# Patient Record
Sex: Female | Born: 1935 | Race: Black or African American | Hispanic: No | Smoking: Former smoker
Health system: Southern US, Community
[De-identification: ages and names within clinical notes are randomized; demographics above are authoritative.]

## PROBLEM LIST (undated history)

## (undated) DIAGNOSIS — H269 Unspecified cataract: Secondary | ICD-10-CM

## (undated) DIAGNOSIS — R413 Other amnesia: Secondary | ICD-10-CM

## (undated) DIAGNOSIS — F0390 Unspecified dementia without behavioral disturbance: Secondary | ICD-10-CM

## (undated) DIAGNOSIS — R51 Headache: Secondary | ICD-10-CM

## (undated) DIAGNOSIS — I10 Essential (primary) hypertension: Secondary | ICD-10-CM

## (undated) DIAGNOSIS — L930 Discoid lupus erythematosus: Secondary | ICD-10-CM

## (undated) DIAGNOSIS — G20A1 Parkinson's disease without dyskinesia, without mention of fluctuations: Secondary | ICD-10-CM

## (undated) DIAGNOSIS — F419 Anxiety disorder, unspecified: Secondary | ICD-10-CM

## (undated) HISTORY — DX: Anxiety disorder, unspecified: F41.9

## (undated) HISTORY — PX: EXCISIONAL HEMORRHOIDECTOMY: SHX1541

## (undated) HISTORY — PX: ABDOMINAL HYSTERECTOMY: SHX81

## (undated) HISTORY — PX: BREAST EXCISIONAL BIOPSY: SUR124

## (undated) HISTORY — DX: Other amnesia: R41.3

## (undated) HISTORY — DX: Unspecified dementia, unspecified severity, without behavioral disturbance, psychotic disturbance, mood disturbance, and anxiety: F03.90

## (undated) HISTORY — DX: Headache: R51

## (undated) HISTORY — DX: Parkinson's disease without dyskinesia, without mention of fluctuations: G20.A1

## (undated) HISTORY — DX: Discoid lupus erythematosus: L93.0

## (undated) HISTORY — DX: Unspecified cataract: H26.9

## (undated) HISTORY — PX: CHOLECYSTECTOMY: SHX55

---

## 1997-06-07 ENCOUNTER — Other Ambulatory Visit: Admission: RE | Admit: 1997-06-07 | Discharge: 1997-06-07 | Payer: Self-pay | Admitting: Internal Medicine

## 1997-06-13 ENCOUNTER — Ambulatory Visit (HOSPITAL_COMMUNITY): Admission: RE | Admit: 1997-06-13 | Discharge: 1997-06-13 | Payer: Self-pay | Admitting: Internal Medicine

## 1998-05-22 ENCOUNTER — Other Ambulatory Visit: Admission: RE | Admit: 1998-05-22 | Discharge: 1998-05-22 | Payer: Self-pay | Admitting: Internal Medicine

## 1999-08-06 ENCOUNTER — Encounter: Admission: RE | Admit: 1999-08-06 | Discharge: 1999-08-06 | Payer: Self-pay | Admitting: Internal Medicine

## 1999-08-06 ENCOUNTER — Encounter: Payer: Self-pay | Admitting: Internal Medicine

## 2000-08-07 ENCOUNTER — Encounter: Admission: RE | Admit: 2000-08-07 | Discharge: 2000-08-07 | Payer: Self-pay | Admitting: Internal Medicine

## 2000-08-07 ENCOUNTER — Encounter: Payer: Self-pay | Admitting: Internal Medicine

## 2001-01-28 ENCOUNTER — Other Ambulatory Visit: Admission: RE | Admit: 2001-01-28 | Discharge: 2001-01-28 | Payer: Self-pay | Admitting: Internal Medicine

## 2001-01-28 ENCOUNTER — Encounter: Payer: Self-pay | Admitting: Internal Medicine

## 2001-01-28 ENCOUNTER — Ambulatory Visit (HOSPITAL_COMMUNITY): Admission: RE | Admit: 2001-01-28 | Discharge: 2001-01-28 | Payer: Self-pay | Admitting: Internal Medicine

## 2001-08-03 ENCOUNTER — Encounter: Admission: RE | Admit: 2001-08-03 | Discharge: 2001-08-03 | Payer: Self-pay | Admitting: Internal Medicine

## 2001-08-03 ENCOUNTER — Encounter: Payer: Self-pay | Admitting: Internal Medicine

## 2002-08-12 ENCOUNTER — Encounter: Admission: RE | Admit: 2002-08-12 | Discharge: 2002-08-12 | Payer: Self-pay | Admitting: Internal Medicine

## 2002-08-12 ENCOUNTER — Encounter: Payer: Self-pay | Admitting: Internal Medicine

## 2002-12-13 ENCOUNTER — Other Ambulatory Visit: Admission: RE | Admit: 2002-12-13 | Discharge: 2002-12-13 | Payer: Self-pay | Admitting: Internal Medicine

## 2003-08-26 ENCOUNTER — Encounter: Admission: RE | Admit: 2003-08-26 | Discharge: 2003-08-26 | Payer: Self-pay | Admitting: Internal Medicine

## 2004-06-26 ENCOUNTER — Other Ambulatory Visit: Admission: RE | Admit: 2004-06-26 | Discharge: 2004-06-26 | Payer: Self-pay | Admitting: Internal Medicine

## 2004-09-06 ENCOUNTER — Encounter: Admission: RE | Admit: 2004-09-06 | Discharge: 2004-09-06 | Payer: Self-pay | Admitting: Internal Medicine

## 2005-06-27 ENCOUNTER — Other Ambulatory Visit: Admission: RE | Admit: 2005-06-27 | Discharge: 2005-06-27 | Payer: Self-pay | Admitting: Internal Medicine

## 2005-07-01 ENCOUNTER — Ambulatory Visit (HOSPITAL_COMMUNITY): Admission: RE | Admit: 2005-07-01 | Discharge: 2005-07-01 | Payer: Self-pay | Admitting: Internal Medicine

## 2005-09-09 ENCOUNTER — Encounter: Admission: RE | Admit: 2005-09-09 | Discharge: 2005-09-09 | Payer: Self-pay | Admitting: Internal Medicine

## 2006-09-16 ENCOUNTER — Ambulatory Visit (HOSPITAL_COMMUNITY): Admission: RE | Admit: 2006-09-16 | Discharge: 2006-09-16 | Payer: Self-pay | Admitting: Internal Medicine

## 2006-09-19 ENCOUNTER — Encounter: Admission: RE | Admit: 2006-09-19 | Discharge: 2006-09-19 | Payer: Self-pay | Admitting: Internal Medicine

## 2006-12-23 ENCOUNTER — Encounter (HOSPITAL_COMMUNITY): Admission: RE | Admit: 2006-12-23 | Discharge: 2007-01-30 | Payer: Self-pay | Admitting: Internal Medicine

## 2007-09-21 ENCOUNTER — Encounter: Admission: RE | Admit: 2007-09-21 | Discharge: 2007-09-21 | Payer: Self-pay | Admitting: Internal Medicine

## 2007-09-23 ENCOUNTER — Encounter: Admission: RE | Admit: 2007-09-23 | Discharge: 2007-09-23 | Payer: Self-pay | Admitting: Internal Medicine

## 2008-09-21 ENCOUNTER — Encounter: Admission: RE | Admit: 2008-09-21 | Discharge: 2008-09-21 | Payer: Self-pay | Admitting: Internal Medicine

## 2009-09-29 ENCOUNTER — Encounter: Admission: RE | Admit: 2009-09-29 | Discharge: 2009-09-29 | Payer: Self-pay | Admitting: Internal Medicine

## 2009-12-29 ENCOUNTER — Ambulatory Visit (HOSPITAL_COMMUNITY)
Admission: RE | Admit: 2009-12-29 | Discharge: 2009-12-29 | Payer: Self-pay | Source: Home / Self Care | Attending: Internal Medicine | Admitting: Internal Medicine

## 2010-09-12 ENCOUNTER — Other Ambulatory Visit: Payer: Self-pay | Admitting: Internal Medicine

## 2010-09-12 DIAGNOSIS — Z1231 Encounter for screening mammogram for malignant neoplasm of breast: Secondary | ICD-10-CM

## 2010-10-02 ENCOUNTER — Ambulatory Visit
Admission: RE | Admit: 2010-10-02 | Discharge: 2010-10-02 | Disposition: A | Discharge status: Home / Self Care | Payer: Medicare Other | Source: Ambulatory Visit | Attending: Internal Medicine | Admitting: Internal Medicine

## 2010-10-02 DIAGNOSIS — Z1231 Encounter for screening mammogram for malignant neoplasm of breast: Secondary | ICD-10-CM

## 2011-08-29 ENCOUNTER — Other Ambulatory Visit: Payer: Self-pay | Admitting: Internal Medicine

## 2011-08-29 DIAGNOSIS — Z1231 Encounter for screening mammogram for malignant neoplasm of breast: Secondary | ICD-10-CM

## 2011-10-04 ENCOUNTER — Ambulatory Visit
Admission: RE | Admit: 2011-10-04 | Discharge: 2011-10-04 | Disposition: A | Discharge status: Home / Self Care | Payer: Medicare Other | Source: Ambulatory Visit | Attending: Internal Medicine | Admitting: Internal Medicine

## 2011-10-04 DIAGNOSIS — Z1231 Encounter for screening mammogram for malignant neoplasm of breast: Secondary | ICD-10-CM

## 2012-09-07 ENCOUNTER — Other Ambulatory Visit: Payer: Self-pay

## 2012-09-07 DIAGNOSIS — Z1231 Encounter for screening mammogram for malignant neoplasm of breast: Secondary | ICD-10-CM

## 2012-10-05 ENCOUNTER — Ambulatory Visit
Admission: RE | Admit: 2012-10-05 | Discharge: 2012-10-05 | Disposition: A | Discharge status: Home / Self Care | Payer: Medicare Other | Source: Ambulatory Visit

## 2012-10-05 DIAGNOSIS — Z1231 Encounter for screening mammogram for malignant neoplasm of breast: Secondary | ICD-10-CM

## 2012-10-06 ENCOUNTER — Other Ambulatory Visit: Payer: Self-pay | Admitting: Internal Medicine

## 2012-10-06 DIAGNOSIS — R928 Other abnormal and inconclusive findings on diagnostic imaging of breast: Secondary | ICD-10-CM

## 2012-10-21 ENCOUNTER — Ambulatory Visit
Admission: RE | Admit: 2012-10-21 | Discharge: 2012-10-21 | Disposition: A | Discharge status: Home / Self Care | Payer: Medicare Other | Source: Ambulatory Visit | Attending: Internal Medicine | Admitting: Internal Medicine

## 2012-10-21 DIAGNOSIS — R928 Other abnormal and inconclusive findings on diagnostic imaging of breast: Secondary | ICD-10-CM

## 2013-01-16 ENCOUNTER — Other Ambulatory Visit: Payer: Self-pay

## 2013-01-16 ENCOUNTER — Emergency Department (HOSPITAL_COMMUNITY): Payer: Medicare Other

## 2013-01-16 ENCOUNTER — Emergency Department (HOSPITAL_COMMUNITY)
Admission: EM | Admit: 2013-01-16 | Discharge: 2013-01-16 | Disposition: A | Discharge status: Home / Self Care | Payer: Medicare Other | Attending: Emergency Medicine | Admitting: Emergency Medicine

## 2013-01-16 ENCOUNTER — Encounter (HOSPITAL_COMMUNITY): Payer: Self-pay | Admitting: Emergency Medicine

## 2013-01-16 DIAGNOSIS — N390 Urinary tract infection, site not specified: Secondary | ICD-10-CM | POA: Diagnosis present

## 2013-01-16 DIAGNOSIS — R079 Chest pain, unspecified: Secondary | ICD-10-CM | POA: Insufficient documentation

## 2013-01-16 DIAGNOSIS — Z79899 Other long term (current) drug therapy: Secondary | ICD-10-CM | POA: Insufficient documentation

## 2013-01-16 DIAGNOSIS — I10 Essential (primary) hypertension: Secondary | ICD-10-CM | POA: Diagnosis present

## 2013-01-16 HISTORY — DX: Essential (primary) hypertension: I10

## 2013-01-16 LAB — URINALYSIS, ROUTINE W REFLEX MICROSCOPIC
Bilirubin Urine: NEGATIVE
Glucose, UA: NEGATIVE mg/dL
Ketones, ur: NEGATIVE mg/dL
Nitrite: NEGATIVE
Protein, ur: NEGATIVE mg/dL
Specific Gravity, Urine: 1.009 (ref 1.005–1.030)
pH: 6.5 (ref 5.0–8.0)

## 2013-01-16 LAB — CBC
HCT: 35.6 % — ABNORMAL LOW (ref 36.0–46.0)
Hemoglobin: 12 g/dL (ref 12.0–15.0)
MCH: 29.6 pg (ref 26.0–34.0)
MCHC: 33.7 g/dL (ref 30.0–36.0)
RBC: 4.06 MIL/uL (ref 3.87–5.11)

## 2013-01-16 LAB — BASIC METABOLIC PANEL
BUN: 19 mg/dL (ref 6–23)
CO2: 25 mEq/L (ref 19–32)
Calcium: 8.9 mg/dL (ref 8.4–10.5)
GFR calc non Af Amer: 43 mL/min — ABNORMAL LOW (ref >90)
Glucose, Bld: 94 mg/dL (ref 70–99)

## 2013-01-16 LAB — POCT I-STAT TROPONIN I: Troponin i, poc: 0 ng/mL (ref 0.00–0.08)

## 2013-01-16 LAB — URINE MICROSCOPIC-ADD ON

## 2013-01-16 MED ORDER — CEPHALEXIN 500 MG PO CAPS
500.0000 mg | ORAL_CAPSULE | Freq: Once | ORAL | Status: AC
  Administered 2013-01-16: 500 mg via ORAL
  Filled 2013-01-16: qty 1

## 2013-01-16 MED ORDER — HYDROCHLOROTHIAZIDE 12.5 MG PO CAPS
12.5000 mg | ORAL_CAPSULE | Freq: Once | ORAL | Status: AC
  Administered 2013-01-16: 12.5 mg via ORAL
  Filled 2013-01-16: qty 1

## 2013-01-16 MED ORDER — HYDROCHLOROTHIAZIDE 25 MG PO TABS: 12.5000 mg | ORAL_TABLET | Freq: Every day | ORAL | Status: DC

## 2013-01-16 MED ORDER — HYDRALAZINE HCL 20 MG/ML IJ SOLN
5.0000 mg | Freq: Once | INTRAMUSCULAR | Status: DC
  Filled 2013-01-16: qty 0.25

## 2013-01-16 MED ORDER — SODIUM CHLORIDE 0.9 % IV BOLUS (SEPSIS)
500.0000 mL | Freq: Once | INTRAVENOUS | Status: AC
  Administered 2013-01-16: 500 mL via INTRAVENOUS

## 2013-01-16 MED ORDER — HYDRALAZINE HCL 20 MG/ML IJ SOLN
10.0000 mg | Freq: Once | INTRAMUSCULAR | Status: AC
  Administered 2013-01-16: 10 mg via INTRAVENOUS
  Filled 2013-01-16 (×2): qty 0.5

## 2013-01-16 MED ORDER — CEPHALEXIN 500 MG PO CAPS: 500.0000 mg | ORAL_CAPSULE | Freq: Four times a day (QID) | ORAL | Status: DC

## 2013-01-16 NOTE — Discharge Instructions (Signed)
Whitney Henderson was seen in the emergency department on 01/16/2013 for elevated blood pressure. Your blood pressure was elevated to 215/87. She got screening labwork, EKG, chest x-ray. The chest x-ray showed some cardiomegaly but no other acute change. Her blood pressure came down to 180/80 with 10 mg of hydralazine. The family requested medication until she see her primary care physician so her a prescription for hydrochlorothiazide. She also had an equivocal urine which I elected to conservatively treat for a UTI with Keflex.

## 2013-01-16 NOTE — ED Provider Notes (Signed)
CSN: [id number]     Arrival date & time 01/16/13  1431 History   First MD Initiated Contact with Patient 01/16/13 1524     Chief Complaint  Patient presents with  . Hypertension   (Consider location/radiation/quality/duration/timing/severity/associated sxs/prior Treatment) Patient is a 79 y.o. female presenting with hypertension. The history is provided by the patient.  Hypertension This is a chronic problem. Episode onset: worsening over last 4 days. The problem occurs constantly. The problem has not changed since onset.Associated symptoms include chest pain (mild over the last few weeks). Pertinent negatives include no abdominal pain, no headaches and no shortness of breath. Nothing aggravates the symptoms. Nothing relieves the symptoms. She has tried nothing for the symptoms. The treatment provided no relief.    Past Medical History  Diagnosis Date  . Hypertension    Past Surgical History  Procedure Laterality Date  . Abdominal hysterectomy    . Cholecystectomy     History reviewed. No pertinent family history. History  Substance Use Topics  . Smoking status: Never Smoker   . Smokeless tobacco: Not on file  . Alcohol Use: No   OB History   Grav Para Term Preterm Abortions TAB SAB Ect Mult Living                 Review of Systems  Constitutional: Negative for fever and fatigue.  HENT: Negative for congestion and drooling.   Eyes: Negative for pain.  Respiratory: Negative for cough and shortness of breath.   Cardiovascular: Positive for chest pain (mild over the last few weeks).  Gastrointestinal: Negative for nausea, vomiting, abdominal pain and diarrhea.  Genitourinary: Negative for dysuria and hematuria.  Musculoskeletal: Negative for back pain, gait problem and neck pain.  Skin: Negative for color change.  Neurological: Negative for dizziness and headaches.  Hematological: Negative for adenopathy.  Psychiatric/Behavioral: Negative for behavioral problems.  All  other systems reviewed and are negative.    Allergies  Review of patient's allergies indicates no known allergies.  Home Medications   Current Outpatient Rx  Name  Route  Sig  Dispense  Refill  . olmesartan (BENICAR) 40 MG tablet   Oral   Take 40 mg by mouth daily.         [name] OVER THE COUNTER MEDICATION   Oral   Take 1 tablet by mouth daily as needed (mood).         . traZODone (DESYREL) 100 MG tablet   Oral   Take 100 mg by mouth at bedtime as needed for sleep.          BP 215/84  Temp(Src) 98.1 F (36.7 C) (Oral)  Resp 20  SpO2 98% Physical Exam  Nursing note and vitals reviewed. Constitutional: She is oriented to person, place, and time. She appears well-developed and well-nourished.  HENT:  Head: Normocephalic.  Mouth/Throat: Oropharynx is clear and moist. No oropharyngeal exudate.  Eyes: Conjunctivae and EOM are normal. Pupils are equal, round, and reactive to light.  Neck: Normal range of motion. Neck supple.  Cardiovascular: Normal rate, regular rhythm, normal heart sounds and intact distal pulses.  Exam reveals no gallop and no friction rub.   No murmur heard. Pulmonary/Chest: Effort normal and breath sounds normal. No respiratory distress. She has no wheezes.  Abdominal: Soft. Bowel sounds are normal. There is no tenderness. There is no rebound and no guarding.  Musculoskeletal: Normal range of motion. She exhibits no edema and no tenderness.  Neurological: She is alert and oriented to  person, place, and time. She has normal strength. No cranial nerve deficit or sensory deficit. She displays a negative Romberg sign. Coordination and gait normal.  Normal speech and understanding.  Normal finger to nose bilaterally.  Able to ambulate forwards and backwards without difficulty.  Skin: Skin is warm and dry.  Psychiatric: She has a normal mood and affect. Her behavior is normal.    ED Course  Procedures (including critical care time) Labs Review Labs  Reviewed  CBC - Abnormal; Notable for the following:    HCT 35.6 (*)    All other components within normal limits  BASIC METABOLIC PANEL - Abnormal; Notable for the following:    Creatinine, Ser 1.16 (*)    GFR calc non Af Amer 43 (*)    GFR calc Af Amer 49 (*)    All other components within normal limits  URINALYSIS, ROUTINE W REFLEX MICROSCOPIC - Abnormal; Notable for the following:    Leukocytes, UA MODERATE (*)    All other components within normal limits  URINE MICROSCOPIC-ADD ON  POCT I-STAT TROPONIN I   Imaging Review Dg Chest 2 View  01/16/2013   CLINICAL DATA:  Hypertension.  EXAM: CHEST  2 VIEW  COMPARISON:  None.  FINDINGS: Heart size is mildly enlarged but there is no pulmonary edema. Lungs are clear. No pneumothorax or pleural effusion.  IMPRESSION: Cardiomegaly without acute disease.   Electronically Signed   By: Drusilla Kanner M.D.   On: 01/16/2013 16:30    EKG Interpretation   None       Date: 01/16/2013  Rate: 57  Rhythm: normal sinus rhythm  QRS Axis: normal  Intervals: normal  ST/T Wave abnormalities: normal  Conduction Disutrbances:none  Narrative Interpretation: No ST or T wave changes consistent with ischemia.    Old EKG Reviewed: none available    MDM   1. UTI (lower urinary tract infection)   2. Hypertension    3:39 PM 79 y.o. female with a history of hypertension on benicar who presents with elevated blood pressures over the last 4 days. The patient states that she checks her blood pressures daily. She notes blood pressures with a systolic component in the 180s over the last few days and in the low  200s today. She states that for the last few weeks she has also had some very mild intermittent chest discomfort in her right upper chest which she describes as pressure. She notes this typically occurs at rest. She is afebrile and hypertensive here today. She denies any dizziness, headache, or blurred vision. She denies any shortness of breath. Will  get screening labwork, 500 cc IV fluid bolus, and hydralazine. She notes that she has not missed her meds. I suspect her elevated BP is related to the holidays and stress as she has visitors in her house.   6:00 PM: BP has dec approp w/ rest and 10mg  hydralazine. Family would like a new medication until she can f/u w/ her pcp, will start hctz. UA also equivocal for UTI, discussed w/ pt and family, will treat. I have discussed the diagnosis/risks/treatment options with the patient and family and believe the pt to be eligible for discharge home to follow-up with pcp next week. We also discussed returning to the ED immediately if new or worsening sx occur. We discussed the sx which are most concerning (e.g., sob, cp, blurred vision, HA, dizziness, continued elevated BP's) that necessitate immediate return. Any new prescriptions provided to the patient are listed below.  Discharge Medication List as of 01/16/2013  6:04 PM    START taking these medications   Details  cephALEXin (KEFLEX) 500 MG capsule Take 1 capsule (500 mg total) by mouth 4 (four) times daily., Starting 01/16/2013, Until Discontinued, Print    hydrochlorothiazide (HYDRODIURIL) 25 MG tablet Take 0.5 tablets (12.5 mg total) by mouth daily., Starting 01/16/2013, Until Discontinued, Print         Junius Argyle, MD 01/17/13 [phone]

## 2013-01-16 NOTE — ED Notes (Signed)
Pt w/ hx of HTN.  States that her BP has been going up despite taking her medication. This morning, it started out at 180s systolic, then 207 systolic, and now in the ER is 216/81.  C/o chest discomfort.

## 2013-02-03 ENCOUNTER — Other Ambulatory Visit: Payer: Self-pay | Admitting: Internal Medicine

## 2013-02-03 DIAGNOSIS — R109 Unspecified abdominal pain: Secondary | ICD-10-CM

## 2013-02-03 DIAGNOSIS — R634 Abnormal weight loss: Secondary | ICD-10-CM

## 2013-02-03 DIAGNOSIS — R630 Anorexia: Secondary | ICD-10-CM

## 2013-02-10 ENCOUNTER — Ambulatory Visit (HOSPITAL_COMMUNITY): Payer: Medicare Other

## 2013-02-15 ENCOUNTER — Other Ambulatory Visit: Payer: Self-pay | Admitting: Internal Medicine

## 2013-02-15 DIAGNOSIS — R109 Unspecified abdominal pain: Secondary | ICD-10-CM

## 2013-02-17 ENCOUNTER — Ambulatory Visit (HOSPITAL_COMMUNITY): Payer: Medicare Other

## 2013-02-19 ENCOUNTER — Ambulatory Visit (HOSPITAL_COMMUNITY)
Admission: RE | Admit: 2013-02-19 | Discharge: 2013-02-19 | Disposition: A | Discharge status: Home / Self Care | Payer: Medicare Other | Source: Ambulatory Visit | Attending: Internal Medicine | Admitting: Internal Medicine

## 2013-02-19 DIAGNOSIS — K838 Other specified diseases of biliary tract: Secondary | ICD-10-CM | POA: Insufficient documentation

## 2013-02-19 DIAGNOSIS — N289 Disorder of kidney and ureter, unspecified: Secondary | ICD-10-CM | POA: Insufficient documentation

## 2013-02-19 DIAGNOSIS — K8689 Other specified diseases of pancreas: Secondary | ICD-10-CM | POA: Insufficient documentation

## 2013-02-19 DIAGNOSIS — R109 Unspecified abdominal pain: Secondary | ICD-10-CM | POA: Insufficient documentation

## 2013-02-19 MED ORDER — IOHEXOL 300 MG/ML  SOLN
100.0000 mL | Freq: Once | INTRAMUSCULAR | Status: AC | PRN
  Administered 2013-02-19: 100 mL via INTRAVENOUS

## 2013-02-23 ENCOUNTER — Ambulatory Visit (HOSPITAL_COMMUNITY)
Admission: RE | Admit: 2013-02-23 | Discharge: 2013-02-23 | Disposition: A | Discharge status: Home / Self Care | Payer: Medicare Other | Source: Ambulatory Visit | Attending: Internal Medicine | Admitting: Internal Medicine

## 2013-02-23 ENCOUNTER — Other Ambulatory Visit: Payer: Self-pay | Admitting: Internal Medicine

## 2013-02-23 DIAGNOSIS — IMO0002 Reserved for concepts with insufficient information to code with codable children: Secondary | ICD-10-CM | POA: Insufficient documentation

## 2013-02-23 DIAGNOSIS — M545 Low back pain, unspecified: Secondary | ICD-10-CM | POA: Insufficient documentation

## 2013-02-23 DIAGNOSIS — R52 Pain, unspecified: Secondary | ICD-10-CM

## 2013-02-23 DIAGNOSIS — T148XXA Other injury of unspecified body region, initial encounter: Secondary | ICD-10-CM | POA: Insufficient documentation

## 2013-02-23 DIAGNOSIS — I700 Atherosclerosis of aorta: Secondary | ICD-10-CM | POA: Insufficient documentation

## 2013-02-23 DIAGNOSIS — W19XXXA Unspecified fall, initial encounter: Secondary | ICD-10-CM | POA: Insufficient documentation

## 2013-02-24 ENCOUNTER — Other Ambulatory Visit: Payer: Self-pay | Admitting: Internal Medicine

## 2013-02-24 DIAGNOSIS — IMO0002 Reserved for concepts with insufficient information to code with codable children: Secondary | ICD-10-CM

## 2013-02-27 ENCOUNTER — Ambulatory Visit
Admission: RE | Admit: 2013-02-27 | Discharge: 2013-02-27 | Disposition: A | Discharge status: Home / Self Care | Payer: Medicare Other | Source: Ambulatory Visit | Attending: Internal Medicine | Admitting: Internal Medicine

## 2013-02-27 DIAGNOSIS — IMO0002 Reserved for concepts with insufficient information to code with codable children: Secondary | ICD-10-CM

## 2013-09-15 ENCOUNTER — Other Ambulatory Visit: Payer: Self-pay

## 2013-09-15 DIAGNOSIS — Z1231 Encounter for screening mammogram for malignant neoplasm of breast: Secondary | ICD-10-CM

## 2013-10-06 ENCOUNTER — Ambulatory Visit
Admission: RE | Admit: 2013-10-06 | Discharge: 2013-10-06 | Disposition: A | Discharge status: Home / Self Care | Payer: Medicare Other | Source: Ambulatory Visit

## 2013-10-06 DIAGNOSIS — Z1231 Encounter for screening mammogram for malignant neoplasm of breast: Secondary | ICD-10-CM

## 2013-10-07 ENCOUNTER — Other Ambulatory Visit: Payer: Self-pay | Admitting: Gastroenterology

## 2013-10-07 DIAGNOSIS — R1084 Generalized abdominal pain: Secondary | ICD-10-CM

## 2013-10-13 ENCOUNTER — Ambulatory Visit
Admission: RE | Admit: 2013-10-13 | Discharge: 2013-10-13 | Disposition: A | Discharge status: Home / Self Care | Payer: Medicare Other | Source: Ambulatory Visit | Attending: Gastroenterology | Admitting: Gastroenterology

## 2013-10-13 DIAGNOSIS — R1084 Generalized abdominal pain: Secondary | ICD-10-CM

## 2013-10-13 MED ORDER — IOHEXOL 300 MG/ML  SOLN
100.0000 mL | Freq: Once | INTRAMUSCULAR | Status: AC | PRN
  Administered 2013-10-13: 100 mL via INTRAVENOUS

## 2014-04-21 DIAGNOSIS — J40 Bronchitis, not specified as acute or chronic: Secondary | ICD-10-CM | POA: Diagnosis not present

## 2014-04-21 DIAGNOSIS — M150 Primary generalized (osteo)arthritis: Secondary | ICD-10-CM | POA: Diagnosis not present

## 2014-04-21 DIAGNOSIS — R7309 Other abnormal glucose: Secondary | ICD-10-CM | POA: Diagnosis not present

## 2014-04-21 DIAGNOSIS — M2550 Pain in unspecified joint: Secondary | ICD-10-CM | POA: Diagnosis not present

## 2014-04-21 DIAGNOSIS — I10 Essential (primary) hypertension: Secondary | ICD-10-CM | POA: Diagnosis not present

## 2014-04-21 DIAGNOSIS — R079 Chest pain, unspecified: Secondary | ICD-10-CM | POA: Diagnosis not present

## 2014-04-27 DIAGNOSIS — H40013 Open angle with borderline findings, low risk, bilateral: Secondary | ICD-10-CM | POA: Diagnosis not present

## 2014-05-12 DIAGNOSIS — K589 Irritable bowel syndrome without diarrhea: Secondary | ICD-10-CM | POA: Diagnosis not present

## 2014-05-12 DIAGNOSIS — I10 Essential (primary) hypertension: Secondary | ICD-10-CM | POA: Diagnosis not present

## 2014-05-12 DIAGNOSIS — F4323 Adjustment disorder with mixed anxiety and depressed mood: Secondary | ICD-10-CM | POA: Diagnosis not present

## 2014-05-12 DIAGNOSIS — M150 Primary generalized (osteo)arthritis: Secondary | ICD-10-CM | POA: Diagnosis not present

## 2014-08-03 DIAGNOSIS — H25013 Cortical age-related cataract, bilateral: Secondary | ICD-10-CM | POA: Diagnosis not present

## 2014-08-03 DIAGNOSIS — I708 Atherosclerosis of other arteries: Secondary | ICD-10-CM | POA: Diagnosis not present

## 2014-08-03 DIAGNOSIS — H2513 Age-related nuclear cataract, bilateral: Secondary | ICD-10-CM | POA: Diagnosis not present

## 2014-08-03 DIAGNOSIS — H40013 Open angle with borderline findings, low risk, bilateral: Secondary | ICD-10-CM | POA: Diagnosis not present

## 2014-08-03 DIAGNOSIS — H04123 Dry eye syndrome of bilateral lacrimal glands: Secondary | ICD-10-CM | POA: Diagnosis not present

## 2014-08-11 ENCOUNTER — Encounter (INDEPENDENT_AMBULATORY_CARE_PROVIDER_SITE_OTHER): Payer: Self-pay | Admitting: Ophthalmology

## 2014-08-12 ENCOUNTER — Encounter (INDEPENDENT_AMBULATORY_CARE_PROVIDER_SITE_OTHER): Payer: Medicare Other | Admitting: Ophthalmology

## 2014-08-12 DIAGNOSIS — H3531 Nonexudative age-related macular degeneration: Secondary | ICD-10-CM

## 2014-08-12 DIAGNOSIS — H43813 Vitreous degeneration, bilateral: Secondary | ICD-10-CM

## 2014-08-12 DIAGNOSIS — H3509 Other intraretinal microvascular abnormalities: Secondary | ICD-10-CM | POA: Diagnosis not present

## 2014-08-12 DIAGNOSIS — H35033 Hypertensive retinopathy, bilateral: Secondary | ICD-10-CM | POA: Diagnosis not present

## 2014-08-12 DIAGNOSIS — I10 Essential (primary) hypertension: Secondary | ICD-10-CM | POA: Diagnosis not present

## 2014-08-17 DIAGNOSIS — I10 Essential (primary) hypertension: Secondary | ICD-10-CM | POA: Diagnosis not present

## 2014-08-17 DIAGNOSIS — F4323 Adjustment disorder with mixed anxiety and depressed mood: Secondary | ICD-10-CM | POA: Diagnosis not present

## 2014-08-17 DIAGNOSIS — M150 Primary generalized (osteo)arthritis: Secondary | ICD-10-CM | POA: Diagnosis not present

## 2014-09-15 ENCOUNTER — Other Ambulatory Visit: Payer: Self-pay

## 2014-09-15 DIAGNOSIS — Z1231 Encounter for screening mammogram for malignant neoplasm of breast: Secondary | ICD-10-CM

## 2014-09-20 DIAGNOSIS — I10 Essential (primary) hypertension: Secondary | ICD-10-CM | POA: Diagnosis not present

## 2014-09-20 DIAGNOSIS — F4323 Adjustment disorder with mixed anxiety and depressed mood: Secondary | ICD-10-CM | POA: Diagnosis not present

## 2014-09-20 DIAGNOSIS — M150 Primary generalized (osteo)arthritis: Secondary | ICD-10-CM | POA: Diagnosis not present

## 2014-10-10 ENCOUNTER — Ambulatory Visit
Admission: RE | Admit: 2014-10-10 | Discharge: 2014-10-10 | Disposition: A | Discharge status: Home / Self Care | Payer: Medicare Other | Source: Ambulatory Visit

## 2014-10-10 DIAGNOSIS — Z1231 Encounter for screening mammogram for malignant neoplasm of breast: Secondary | ICD-10-CM | POA: Diagnosis not present

## 2014-10-19 DIAGNOSIS — L930 Discoid lupus erythematosus: Secondary | ICD-10-CM | POA: Diagnosis not present

## 2014-10-19 DIAGNOSIS — F4323 Adjustment disorder with mixed anxiety and depressed mood: Secondary | ICD-10-CM | POA: Diagnosis not present

## 2014-10-19 DIAGNOSIS — I10 Essential (primary) hypertension: Secondary | ICD-10-CM | POA: Diagnosis not present

## 2014-10-19 DIAGNOSIS — M150 Primary generalized (osteo)arthritis: Secondary | ICD-10-CM | POA: Diagnosis not present

## 2014-11-04 DIAGNOSIS — H40013 Open angle with borderline findings, low risk, bilateral: Secondary | ICD-10-CM | POA: Diagnosis not present

## 2014-11-04 DIAGNOSIS — H04123 Dry eye syndrome of bilateral lacrimal glands: Secondary | ICD-10-CM | POA: Diagnosis not present

## 2014-11-28 DIAGNOSIS — K589 Irritable bowel syndrome without diarrhea: Secondary | ICD-10-CM | POA: Diagnosis not present

## 2014-11-28 DIAGNOSIS — I10 Essential (primary) hypertension: Secondary | ICD-10-CM | POA: Diagnosis not present

## 2014-11-28 DIAGNOSIS — M150 Primary generalized (osteo)arthritis: Secondary | ICD-10-CM | POA: Diagnosis not present

## 2014-11-28 DIAGNOSIS — L930 Discoid lupus erythematosus: Secondary | ICD-10-CM | POA: Diagnosis not present

## 2014-12-21 DIAGNOSIS — L309 Dermatitis, unspecified: Secondary | ICD-10-CM | POA: Diagnosis not present

## 2015-01-09 DIAGNOSIS — R1032 Left lower quadrant pain: Secondary | ICD-10-CM | POA: Diagnosis not present

## 2015-01-09 DIAGNOSIS — K589 Irritable bowel syndrome without diarrhea: Secondary | ICD-10-CM | POA: Diagnosis not present

## 2015-01-23 DIAGNOSIS — M2550 Pain in unspecified joint: Secondary | ICD-10-CM | POA: Diagnosis not present

## 2015-01-23 DIAGNOSIS — I10 Essential (primary) hypertension: Secondary | ICD-10-CM | POA: Diagnosis not present

## 2015-01-23 DIAGNOSIS — E039 Hypothyroidism, unspecified: Secondary | ICD-10-CM | POA: Diagnosis not present

## 2015-01-23 DIAGNOSIS — K589 Irritable bowel syndrome without diarrhea: Secondary | ICD-10-CM | POA: Diagnosis not present

## 2015-01-23 DIAGNOSIS — L930 Discoid lupus erythematosus: Secondary | ICD-10-CM | POA: Diagnosis not present

## 2015-01-23 DIAGNOSIS — M150 Primary generalized (osteo)arthritis: Secondary | ICD-10-CM | POA: Diagnosis not present

## 2015-01-23 DIAGNOSIS — E559 Vitamin D deficiency, unspecified: Secondary | ICD-10-CM | POA: Diagnosis not present

## 2015-01-23 DIAGNOSIS — R51 Headache: Secondary | ICD-10-CM | POA: Diagnosis not present

## 2015-01-27 DIAGNOSIS — L509 Urticaria, unspecified: Secondary | ICD-10-CM | POA: Diagnosis not present

## 2015-02-15 DIAGNOSIS — L930 Discoid lupus erythematosus: Secondary | ICD-10-CM | POA: Diagnosis not present

## 2015-02-15 DIAGNOSIS — M150 Primary generalized (osteo)arthritis: Secondary | ICD-10-CM | POA: Diagnosis not present

## 2015-02-15 DIAGNOSIS — K589 Irritable bowel syndrome without diarrhea: Secondary | ICD-10-CM | POA: Diagnosis not present

## 2015-02-15 DIAGNOSIS — I10 Essential (primary) hypertension: Secondary | ICD-10-CM | POA: Diagnosis not present

## 2015-02-23 DIAGNOSIS — L508 Other urticaria: Secondary | ICD-10-CM | POA: Diagnosis not present

## 2015-02-27 ENCOUNTER — Ambulatory Visit (INDEPENDENT_AMBULATORY_CARE_PROVIDER_SITE_OTHER): Payer: Medicare Other | Admitting: Ophthalmology

## 2015-02-27 DIAGNOSIS — I10 Essential (primary) hypertension: Secondary | ICD-10-CM | POA: Diagnosis not present

## 2015-02-27 DIAGNOSIS — H43813 Vitreous degeneration, bilateral: Secondary | ICD-10-CM

## 2015-02-27 DIAGNOSIS — H353132 Nonexudative age-related macular degeneration, bilateral, intermediate dry stage: Secondary | ICD-10-CM | POA: Diagnosis not present

## 2015-02-27 DIAGNOSIS — H35033 Hypertensive retinopathy, bilateral: Secondary | ICD-10-CM | POA: Diagnosis not present

## 2015-02-27 DIAGNOSIS — H2513 Age-related nuclear cataract, bilateral: Secondary | ICD-10-CM

## 2015-03-27 DIAGNOSIS — H2511 Age-related nuclear cataract, right eye: Secondary | ICD-10-CM | POA: Diagnosis not present

## 2015-03-27 DIAGNOSIS — Z8679 Personal history of other diseases of the circulatory system: Secondary | ICD-10-CM | POA: Diagnosis not present

## 2015-03-27 DIAGNOSIS — H2512 Age-related nuclear cataract, left eye: Secondary | ICD-10-CM | POA: Diagnosis not present

## 2015-03-27 DIAGNOSIS — H35032 Hypertensive retinopathy, left eye: Secondary | ICD-10-CM | POA: Diagnosis not present

## 2015-03-27 DIAGNOSIS — H40013 Open angle with borderline findings, low risk, bilateral: Secondary | ICD-10-CM | POA: Diagnosis not present

## 2015-04-17 DIAGNOSIS — I10 Essential (primary) hypertension: Secondary | ICD-10-CM | POA: Diagnosis not present

## 2015-04-17 DIAGNOSIS — M150 Primary generalized (osteo)arthritis: Secondary | ICD-10-CM | POA: Diagnosis not present

## 2015-04-17 DIAGNOSIS — K589 Irritable bowel syndrome without diarrhea: Secondary | ICD-10-CM | POA: Diagnosis not present

## 2015-04-17 DIAGNOSIS — L930 Discoid lupus erythematosus: Secondary | ICD-10-CM | POA: Diagnosis not present

## 2015-04-27 ENCOUNTER — Ambulatory Visit (INDEPENDENT_AMBULATORY_CARE_PROVIDER_SITE_OTHER): Payer: Medicare Other | Admitting: Neurology

## 2015-04-27 ENCOUNTER — Encounter: Payer: Self-pay | Admitting: Neurology

## 2015-04-27 VITALS — BP 150/75 | HR 57 | Ht 60.75 in | Wt 146.0 lb

## 2015-04-27 DIAGNOSIS — R51 Headache: Secondary | ICD-10-CM

## 2015-04-27 DIAGNOSIS — Z5181 Encounter for therapeutic drug level monitoring: Secondary | ICD-10-CM

## 2015-04-27 DIAGNOSIS — G4485 Primary stabbing headache: Secondary | ICD-10-CM

## 2015-04-27 DIAGNOSIS — R519 Headache, unspecified: Secondary | ICD-10-CM

## 2015-04-27 HISTORY — DX: Headache, unspecified: R51.9

## 2015-04-27 NOTE — Patient Instructions (Addendum)
   We will check blood work today and get MRI of the brain and a carotid doppler study.   General Headache Without Cause A headache is pain or discomfort felt around the head or neck area. The specific cause of a headache may not be found. There are many causes and types of headaches. A few common ones are:  Tension headaches.  Migraine headaches.  Cluster headaches.  Chronic daily headaches. HOME CARE INSTRUCTIONS  Watch your condition for any changes. Take these steps to help with your condition: Managing Pain  Take over-the-counter and prescription medicines only as told by your health care provider.  Lie down in a dark, quiet room when you have a headache.  If directed, apply ice to the head and neck area:  Put ice in a plastic bag.  Place a towel between your skin and the bag.  Leave the ice on for 20 minutes, 2-3 times per day.  Use a heating pad or hot shower to apply heat to the head and neck area as told by your health care provider.  Keep lights dim if bright lights bother you or make your headaches worse. Eating and Drinking  Eat meals on a regular schedule.  Limit alcohol use.  Decrease the amount of caffeine you drink, or stop drinking caffeine. General Instructions  Keep all follow-up visits as told by your health care provider. This is important.  Keep a headache journal to help find out what may trigger your headaches. For example, write down:  What you eat and drink.  How much sleep you get.  Any change to your diet or medicines.  Try massage or other relaxation techniques.  Limit stress.  Sit up straight, and do not tense your muscles.  Do not use tobacco products, including cigarettes, chewing tobacco, or e-cigarettes. If you need help quitting, ask your health care provider.  Exercise regularly as told by your health care provider.  Sleep on a regular schedule. Get 7-9 hours of sleep, or the amount recommended by your health care  provider. SEEK MEDICAL CARE IF:   Your symptoms are not helped by medicine.  You have a headache that is different from the usual headache.  You have nausea or you vomit.  You have a fever. SEEK IMMEDIATE MEDICAL CARE IF:   Your headache becomes severe.  You have repeated vomiting.  You have a stiff neck.  You have a loss of vision.  You have problems with speech.  You have pain in the eye or ear.  You have muscular weakness or loss of muscle control.  You lose your balance or have trouble walking.  You feel faint or pass out.  You have confusion.   This information is not intended to replace advice given to you by your health care provider. Make sure you discuss any questions you have with your health care provider.   Document Released: 01/07/2005 Document Revised: 09/28/2014 Document Reviewed: 05/02/2014 Elsevier Interactive Patient Education [employer].

## 2015-04-27 NOTE — Progress Notes (Signed)
Reason for visit: Headache  Referring physician: Dr. Danton Sewer I [name] is a 82 y.o. female  History of present illness:  [name] is an 82 year old right-handed black female with a history of headaches that began 2 or 3 weeks ago. She indicates that previously in her life she suffered from migraine headaches, but she has not really had any migraines since she was in her 16s. The patient began having a sharp jabbing pain last only 2 or 3 seconds that would occur around the left eye, occasionally on the right. The patient reports no other associated symptoms such as confusion, visual changes, nausea or vomiting, or loss of consciousness. The patient indicates that she may have days without any pain, and some days she may have one or 2 episodes of sharp pain. The patient really has not been taking any medications for the pain. She also reports that around the time of onset of the headache she began noticing that she was dropping things from her hands, usually from the right hand, but sometimes on the left. She also has had some difficulty lifting the left leg when she gets in and out of a car. She otherwise has had no change in balance, she has not had any falls, she denies issues controlling the bowels or the bladder. She reports no numbness of the extremities. She denies any particular activities that bring on the headache. She has no constitutional symptoms such as fevers, chills, weight loss, or night sweats. She has had a sedimentation rate that was elevated at 68. She denies any tenderness on the scalp or pain into the jaw with chewing. She is sent to this office for an evaluation.  Past Medical History  Diagnosis Date  . Hypertension   . Cataracts, bilateral   . Anxiety   . Headache 04/27/2015    Left>right periorbital    Past Surgical History  Procedure Laterality Date  . Abdominal hysterectomy    . Cholecystectomy    . Excisional hemorrhoidectomy      Family History    Problem Relation Age of Onset  . Cancer Father     Social history:  reports that she has never smoked. She does not have any smokeless tobacco history on file. She reports that she does not drink alcohol or use illicit drugs.  Medications:  Prior to Admission medications   Medication Sig Start Date End Date Taking? Authorizing Provider  amLODipine (NORVASC) 5 MG tablet  03/27/15  Yes Historical Provider, MD  cholecalciferol (VITAMIN D) 1000 units tablet Take 1,000 Units by mouth daily.   Yes Historical Provider, MD  metoprolol succinate (TOPROL-XL) 50 MG 24 hr tablet Take 50 mg by mouth daily. 01/19/15  Yes Historical Provider, MD  Propylene Glycol (SYSTANE BALANCE OP) Apply to eye. Uses when needed.   Yes Historical Provider, MD     No Known Allergies  ROS:  Out of a complete 14 system review of symptoms, the patient complains only of the following symptoms, and all other reviewed systems are negative.  Blurred vision Eye pain Constipation Skin rash, itching Skin sensitivity Anxiety  Blood pressure 150/75, pulse 57, height 5' 0.75" (1.543 m), weight 146 lb (66.225 kg).  Physical Exam  General: The patient is alert and cooperative at the time of the examination.  Eyes: Pupils are equal, round, and reactive to light. Discs are flat bilaterally.  Neck: The neck is supple, no carotid bruits are noted.  Respiratory: The respiratory examination is clear.  Cardiovascular: The cardiovascular examination reveals a regular rate and rhythm, no obvious murmurs or rubs are noted.  Neuromuscular: Range of movement of the cervical spine is relatively full, no crepitus within the temporomandibular joints is noted. Temporal arteries are painless to palpation, no swelling in the temporal region is noted. Good temporal artery pulses are noted bilaterally.  Skin: Extremities are with 1+ edema to ankles bilaterally.  Neurologic Exam  Mental status: The patient is alert and oriented x 3 at  the time of the examination. The patient has apparent normal recent and remote memory, with an apparently normal attention span and concentration ability.  Cranial nerves: Facial symmetry is present. There is good sensation of the face to pinprick and soft touch bilaterally. The strength of the facial muscles and the muscles to head turning and shoulder shrug are normal bilaterally. Speech is well enunciated, no aphasia or dysarthria is noted. Extraocular movements are full. Visual fields are full. The tongue is midline, and the patient has symmetric elevation of the soft palate. No obvious hearing deficits are noted.  Motor: The motor testing reveals 5 over 5 strength of all 4 extremities. Good symmetric motor tone is noted throughout.  Sensory: Sensory testing is intact to pinprick, soft touch, vibration sensation, and position sense on all 4 extremities. No evidence of extinction is noted.  Coordination: Cerebellar testing reveals good finger-nose-finger and heel-to-shin bilaterally.  Gait and station: Gait is normal. Tandem gait is normal. Romberg is negative. No drift is seen.  Reflexes: Deep tendon reflexes are symmetric and normal bilaterally. Toes are downgoing bilaterally.   Assessment/Plan:  1. Periorbital pain  The patient reports onset of left greater than right periorbital discomfort lasting only 2-3 seconds. The episodes seem to stimulate the "ice pick pains" that may occur in individuals with migraine headache. The patient has had an elevated sedimentation rate recently, but her current headache syndrome does not appear to be consistent with temporal arteritis. The patient will undergo workup that includes blood work today, MRI evaluation of the brain with and without gadolinium enhancement, and a carotid Doppler study. There is no indication for medical treatment at this point as the headache events are quite brief and infrequent. I will contact the patient once the results are  available to me.  Jill Alexanders MD 04/27/2015 7:41 PM  [facility] [address] [place], [place] [address]  Phone [phone] Fax [phone]

## 2015-05-01 ENCOUNTER — Telehealth: Payer: Self-pay | Admitting: Neurology

## 2015-05-01 LAB — COMPREHENSIVE METABOLIC PANEL
ALT: 12 IU/L (ref 0–32)
AST: 24 IU/L (ref 0–40)
Albumin/Globulin Ratio: 1.3 (ref 1.2–2.2)
Albumin: 4 g/dL (ref 3.5–4.7)
Alkaline Phosphatase: 67 IU/L (ref 39–117)
BUN / CREAT RATIO: 20 (ref 12–28)
BUN: 25 mg/dL (ref 8–27)
Bilirubin Total: 0.3 mg/dL (ref 0.0–1.2)
CALCIUM: 9.4 mg/dL (ref 8.7–10.3)
CO2: 22 mmol/L (ref 18–29)
CREATININE: 1.27 mg/dL — AB (ref 0.57–1.00)
Chloride: 100 mmol/L (ref 96–106)
GFR, EST AFRICAN AMERICAN: 44 mL/min/{1.73_m2} — AB (ref >59)
GFR, EST NON AFRICAN AMERICAN: 39 mL/min/{1.73_m2} — AB (ref >59)
GLOBULIN, TOTAL: 3.2 g/dL (ref 1.5–4.5)
GLUCOSE: 95 mg/dL (ref 65–99)
Potassium: 4.7 mmol/L (ref 3.5–5.2)
SODIUM: 140 mmol/L (ref 134–144)
TOTAL PROTEIN: 7.2 g/dL (ref 6.0–8.5)

## 2015-05-01 LAB — C-REACTIVE PROTEIN: CRP: 10 mg/L — ABNORMAL HIGH (ref 0.0–4.9)

## 2015-05-01 LAB — ANA W/REFLEX: ANA: NEGATIVE

## 2015-05-01 LAB — ANGIOTENSIN CONVERTING ENZYME: ANGIO CONVERT ENZYME: 35 U/L (ref 14–82)

## 2015-05-01 LAB — RHEUMATOID FACTOR: RHEUMATOID FACTOR: 10.4 [IU]/mL (ref 0.0–13.9)

## 2015-05-01 LAB — SEDIMENTATION RATE: SED RATE: 17 mm/h (ref 0–40)

## 2015-05-01 NOTE — Telephone Encounter (Signed)
I called the patient. The blood work shows a mild elevation in the C-reactive protein, the sedimentation rate was normal. The patient appears to have chronic renal insufficiency that is stable over the last 2 years. She is to contact her office if she has questions.

## 2015-05-04 ENCOUNTER — Ambulatory Visit (INDEPENDENT_AMBULATORY_CARE_PROVIDER_SITE_OTHER): Payer: Medicare Other

## 2015-05-04 DIAGNOSIS — G4485 Primary stabbing headache: Secondary | ICD-10-CM

## 2015-05-06 ENCOUNTER — Ambulatory Visit
Admission: RE | Admit: 2015-05-06 | Discharge: 2015-05-06 | Disposition: A | Discharge status: Home / Self Care | Payer: Medicare Other | Source: Ambulatory Visit | Attending: Neurology | Admitting: Neurology

## 2015-05-06 DIAGNOSIS — G4485 Primary stabbing headache: Secondary | ICD-10-CM

## 2015-05-06 MED ORDER — GADOBENATE DIMEGLUMINE 529 MG/ML IV SOLN
10.0000 mL | Freq: Once | INTRAVENOUS | Status: AC | PRN
  Administered 2015-05-06: 7 mL via INTRAVENOUS

## 2015-05-08 ENCOUNTER — Telehealth: Payer: Self-pay | Admitting: Neurology

## 2015-05-08 NOTE — Telephone Encounter (Signed)
   I called the patient. The MRI is unremarkable, some cortical atrophy is seen.  MRI brain 05/05/15:  IMPRESSION: This MRI of the brain with and without contrast shows the following: 1. Moderate cortical atrophy. 2. Minimal T2 hyperintense changes in the periatrial white matter, consistent with age appropriate chronic microvascular ischemic change 3. There are no acute findings.

## 2015-05-12 ENCOUNTER — Telehealth: Payer: Self-pay | Admitting: Neurology

## 2015-05-12 NOTE — Telephone Encounter (Signed)
I tried to call the patient. The call would not go through. Carotid doppler is normal.

## 2015-05-15 NOTE — Telephone Encounter (Signed)
Spoke to pt and let her know that carotid doppler was normal. Verbalized understanding and appreciation for call.

## 2015-05-16 DIAGNOSIS — H2512 Age-related nuclear cataract, left eye: Secondary | ICD-10-CM | POA: Diagnosis not present

## 2015-05-23 DIAGNOSIS — Z0001 Encounter for general adult medical examination with abnormal findings: Secondary | ICD-10-CM | POA: Diagnosis not present

## 2015-05-23 DIAGNOSIS — I10 Essential (primary) hypertension: Secondary | ICD-10-CM | POA: Diagnosis not present

## 2015-05-23 DIAGNOSIS — K589 Irritable bowel syndrome without diarrhea: Secondary | ICD-10-CM | POA: Diagnosis not present

## 2015-05-23 DIAGNOSIS — M150 Primary generalized (osteo)arthritis: Secondary | ICD-10-CM | POA: Diagnosis not present

## 2015-06-12 DIAGNOSIS — H2511 Age-related nuclear cataract, right eye: Secondary | ICD-10-CM | POA: Diagnosis not present

## 2015-06-12 DIAGNOSIS — H25011 Cortical age-related cataract, right eye: Secondary | ICD-10-CM | POA: Diagnosis not present

## 2015-06-20 DIAGNOSIS — I10 Essential (primary) hypertension: Secondary | ICD-10-CM | POA: Diagnosis not present

## 2015-06-20 DIAGNOSIS — R51 Headache: Secondary | ICD-10-CM | POA: Diagnosis not present

## 2015-06-20 DIAGNOSIS — L930 Discoid lupus erythematosus: Secondary | ICD-10-CM | POA: Diagnosis not present

## 2015-06-20 DIAGNOSIS — M150 Primary generalized (osteo)arthritis: Secondary | ICD-10-CM | POA: Diagnosis not present

## 2015-06-21 DIAGNOSIS — L920 Granuloma annulare: Secondary | ICD-10-CM | POA: Diagnosis not present

## 2015-07-31 DIAGNOSIS — L930 Discoid lupus erythematosus: Secondary | ICD-10-CM | POA: Diagnosis not present

## 2015-07-31 DIAGNOSIS — K589 Irritable bowel syndrome without diarrhea: Secondary | ICD-10-CM | POA: Diagnosis not present

## 2015-07-31 DIAGNOSIS — M150 Primary generalized (osteo)arthritis: Secondary | ICD-10-CM | POA: Diagnosis not present

## 2015-07-31 DIAGNOSIS — G4762 Sleep related leg cramps: Secondary | ICD-10-CM | POA: Diagnosis not present

## 2015-08-01 DIAGNOSIS — H25811 Combined forms of age-related cataract, right eye: Secondary | ICD-10-CM | POA: Diagnosis not present

## 2015-08-01 DIAGNOSIS — H2511 Age-related nuclear cataract, right eye: Secondary | ICD-10-CM | POA: Diagnosis not present

## 2015-08-21 ENCOUNTER — Encounter (INDEPENDENT_AMBULATORY_CARE_PROVIDER_SITE_OTHER): Payer: Self-pay

## 2015-08-21 ENCOUNTER — Ambulatory Visit (HOSPITAL_COMMUNITY)
Admission: RE | Admit: 2015-08-21 | Discharge: 2015-08-21 | Disposition: A | Discharge status: Home / Self Care | Payer: Medicare Other | Source: Ambulatory Visit | Attending: Internal Medicine | Admitting: Internal Medicine

## 2015-08-21 ENCOUNTER — Other Ambulatory Visit: Payer: Self-pay | Admitting: Internal Medicine

## 2015-08-21 DIAGNOSIS — L930 Discoid lupus erythematosus: Secondary | ICD-10-CM | POA: Diagnosis not present

## 2015-08-21 DIAGNOSIS — M150 Primary generalized (osteo)arthritis: Secondary | ICD-10-CM | POA: Diagnosis not present

## 2015-08-21 DIAGNOSIS — I10 Essential (primary) hypertension: Secondary | ICD-10-CM | POA: Diagnosis not present

## 2015-08-21 DIAGNOSIS — M546 Pain in thoracic spine: Secondary | ICD-10-CM | POA: Diagnosis not present

## 2015-08-21 DIAGNOSIS — M8588 Other specified disorders of bone density and structure, other site: Secondary | ICD-10-CM | POA: Diagnosis not present

## 2015-08-21 DIAGNOSIS — R0789 Other chest pain: Secondary | ICD-10-CM | POA: Diagnosis not present

## 2015-08-21 DIAGNOSIS — R079 Chest pain, unspecified: Secondary | ICD-10-CM | POA: Diagnosis not present

## 2015-08-31 ENCOUNTER — Ambulatory Visit (INDEPENDENT_AMBULATORY_CARE_PROVIDER_SITE_OTHER): Payer: Medicare Other | Admitting: Ophthalmology

## 2015-08-31 DIAGNOSIS — H353132 Nonexudative age-related macular degeneration, bilateral, intermediate dry stage: Secondary | ICD-10-CM

## 2015-08-31 DIAGNOSIS — H35033 Hypertensive retinopathy, bilateral: Secondary | ICD-10-CM

## 2015-08-31 DIAGNOSIS — I10 Essential (primary) hypertension: Secondary | ICD-10-CM | POA: Diagnosis not present

## 2015-08-31 DIAGNOSIS — H43813 Vitreous degeneration, bilateral: Secondary | ICD-10-CM | POA: Diagnosis not present

## 2015-09-13 ENCOUNTER — Other Ambulatory Visit: Payer: Self-pay | Admitting: Internal Medicine

## 2015-09-13 DIAGNOSIS — Z1231 Encounter for screening mammogram for malignant neoplasm of breast: Secondary | ICD-10-CM

## 2015-10-16 ENCOUNTER — Ambulatory Visit
Admission: RE | Admit: 2015-10-16 | Discharge: 2015-10-16 | Disposition: A | Discharge status: Home / Self Care | Payer: Medicare Other | Source: Ambulatory Visit | Attending: Internal Medicine | Admitting: Internal Medicine

## 2015-10-16 DIAGNOSIS — Z1231 Encounter for screening mammogram for malignant neoplasm of breast: Secondary | ICD-10-CM

## 2015-12-28 DIAGNOSIS — H04123 Dry eye syndrome of bilateral lacrimal glands: Secondary | ICD-10-CM | POA: Diagnosis not present

## 2015-12-28 DIAGNOSIS — H524 Presbyopia: Secondary | ICD-10-CM | POA: Diagnosis not present

## 2015-12-28 DIAGNOSIS — H40013 Open angle with borderline findings, low risk, bilateral: Secondary | ICD-10-CM | POA: Diagnosis not present

## 2016-03-14 ENCOUNTER — Other Ambulatory Visit: Payer: Self-pay | Admitting: Internal Medicine

## 2016-03-14 ENCOUNTER — Ambulatory Visit (HOSPITAL_COMMUNITY)
Admission: RE | Admit: 2016-03-14 | Discharge: 2016-03-14 | Disposition: A | Discharge status: Home / Self Care | Payer: Medicare Other | Source: Ambulatory Visit | Attending: Internal Medicine | Admitting: Internal Medicine

## 2016-03-14 DIAGNOSIS — M4856XA Collapsed vertebra, not elsewhere classified, lumbar region, initial encounter for fracture: Secondary | ICD-10-CM | POA: Diagnosis not present

## 2016-03-14 DIAGNOSIS — G549 Nerve root and plexus disorder, unspecified: Secondary | ICD-10-CM

## 2016-03-14 DIAGNOSIS — M545 Low back pain: Secondary | ICD-10-CM | POA: Insufficient documentation

## 2016-03-14 DIAGNOSIS — I700 Atherosclerosis of aorta: Secondary | ICD-10-CM | POA: Diagnosis not present

## 2016-03-14 DIAGNOSIS — M4320 Fusion of spine, site unspecified: Secondary | ICD-10-CM

## 2016-05-02 ENCOUNTER — Encounter: Payer: Self-pay | Admitting: Neurology

## 2016-05-02 ENCOUNTER — Encounter (INDEPENDENT_AMBULATORY_CARE_PROVIDER_SITE_OTHER): Payer: Self-pay

## 2016-05-02 ENCOUNTER — Ambulatory Visit (INDEPENDENT_AMBULATORY_CARE_PROVIDER_SITE_OTHER): Payer: Medicare Other | Admitting: Neurology

## 2016-05-02 VITALS — BP 143/74 | HR 50 | Ht 62.0 in | Wt 152.5 lb

## 2016-05-02 DIAGNOSIS — G4485 Primary stabbing headache: Secondary | ICD-10-CM

## 2016-05-02 NOTE — Progress Notes (Signed)
Reason for visit: Headache  Whitney Henderson is an 83 y.o. female  History of present illness:  Whitney Henderson is an 83 year old right-handed black female with a history of sharp lancinating pains around the eyes on both sides that have started again around 2 or 3 weeks ago. The patient was seen about one year ago for the same problem. The patient had been noted to have an elevated sedimentation rate, but recheck of the ESR revealed that the blood work returned to normal. The patient was set up for MRI evaluation of the brain which showed generalized cortical atrophy but no evidence of small vessel ischemic changes. The patient underwent a carotid Doppler study that was normal. She noted that the sharp jabbing pains went away completely throughout the year, and have only returned within the last several weeks. The patient is having identical symptoms, the jabs of pain are very brief, and she may only have a handful of episodes during the week. The headache pains do not prevent her from functioning during the day. She returns for an evaluation.  Past Medical History:  Diagnosis Date  . Anxiety   . Cataracts, bilateral   . Headache 04/27/2015   Left>right periorbital  . Hypertension     Past Surgical History:  Procedure Laterality Date  . ABDOMINAL HYSTERECTOMY    . CHOLECYSTECTOMY    . EXCISIONAL HEMORRHOIDECTOMY      Family History  Problem Relation Age of Onset  . Cancer Father     Social history:  reports that she has quit smoking. She has never used smokeless tobacco. She reports that she does not drink alcohol or use drugs.   No Known Allergies  Medications:  Prior to Admission medications   Medication Sig Start Date End Date Taking? Authorizing Provider  ALPRAZolam (XANAX) 0.25 MG tablet Take 0.25 mg by mouth as needed. 02/29/16  Yes Historical Provider, MD  amLODipine (NORVASC) 5 MG tablet  03/27/15  Yes Historical Provider, MD  augmented betamethasone dipropionate  (DIPROLENE-AF) 0.05 % cream Apply 1 application topically as needed. For itching 03/15/16  Yes Historical Provider, MD  cholecalciferol (VITAMIN D) 1000 units tablet Take 1,000 Units by mouth daily.   Yes Historical Provider, MD  DULoxetine (CYMBALTA) 20 MG capsule Take 20 mg by mouth daily. 04/29/16  Yes Historical Provider, MD  metoprolol succinate (TOPROL-XL) 50 MG 24 hr tablet Take 50 mg by mouth daily. 01/19/15  Yes Historical Provider, MD  Propylene Glycol (SYSTANE BALANCE OP) Apply to eye. Uses when needed.   Yes Historical Provider, MD    ROS:  Out of a complete 14 system review of symptoms, the patient complains only of the following symptoms, and all other reviewed systems are negative.  Frequent waking Back pain Itching  Blood pressure (!) 143/74, pulse (!) 50, height 5' 2" (1.575 m), weight 152 lb 8 oz (69.2 kg).  Physical Exam  General: The patient is alert and cooperative at the time of the examination.  Neuromuscular: The patient lacks about 30 of lateral rotation of the cervical spine when turning her head to the left, 20 to the right.  Skin: No significant peripheral edema is noted.   Neurologic Exam  Mental status: The patient is alert and oriented x 3 at the time of the examination. The patient has apparent normal recent and remote memory, with an apparently normal attention span and concentration ability.   Cranial nerves: Facial symmetry is present. Speech is normal, no aphasia or dysarthria is  noted. Extraocular movements are full. Visual fields are full.  Motor: The patient has good strength in all 4 extremities.  Sensory examination: Soft touch sensation is symmetric on the face, arms, and legs.  Coordination: The patient has good finger-nose-finger and heel-to-shin bilaterally.  Gait and station: The patient has a normal gait. Tandem gait is normal. Romberg is negative. No drift is seen.  Reflexes: Deep tendon reflexes are symmetric.   MRI brain  05/05/15:  IMPRESSION: This MRI of the brain with and without contrast shows the following: 1. Moderate cortical atrophy. 2. Minimal T2 hyperintense changes in the periatrial white matter, consistent with age appropriate chronic microvascular ischemic change 3. There are no acute findings.  * MRI scan images were reviewed online. I agree with the written report.    Assessment/Plan:  1. Headache  The patient appears to have a lot of underlying anxiety regarding the sharp lancinating headache pains. The workup previously was unremarkable, it does not appear that the episodes are frequent enough to justify a daily prophylactic medication. I will have the patient contact our office if the headache frequency changes. I do not see any indication for further neurologic evaluation.  Jill Alexanders MD 05/02/2016 11:21 AM  [facility] [address] [place], [place] [address]  Phone [phone] Fax [phone]

## 2016-09-04 ENCOUNTER — Encounter (INDEPENDENT_AMBULATORY_CARE_PROVIDER_SITE_OTHER): Payer: Self-pay

## 2016-09-04 ENCOUNTER — Ambulatory Visit (INDEPENDENT_AMBULATORY_CARE_PROVIDER_SITE_OTHER): Payer: Medicare Other | Admitting: Neurology

## 2016-09-04 ENCOUNTER — Encounter: Payer: Self-pay | Admitting: Neurology

## 2016-09-04 VITALS — BP 150/58 | HR 48 | Wt 148.5 lb

## 2016-09-04 DIAGNOSIS — G479 Sleep disorder, unspecified: Secondary | ICD-10-CM

## 2016-09-04 DIAGNOSIS — R4189 Other symptoms and signs involving cognitive functions and awareness: Secondary | ICD-10-CM | POA: Insufficient documentation

## 2016-09-04 DIAGNOSIS — R413 Other amnesia: Secondary | ICD-10-CM

## 2016-09-04 HISTORY — DX: Other amnesia: R41.3

## 2016-09-04 MED ORDER — DULOXETINE HCL 30 MG PO CPEP: ORAL_CAPSULE | ORAL | 3 refills | Status: DC

## 2016-09-04 NOTE — Patient Instructions (Signed)
   We will go up on the Cymbalta to 30 mg a day for 2 weeks, then take one twice a day.  Cymbalta (duloxetine) is an antidepressant medication that is commonly used for peripheral neuropathy pain or for fibromyalgia pain. As with any antidepressant medication, worsening depression can be seen. This medication can potentially cause headache, dizziness, sexual dysfunction, or nausea. If any problems are noted on this medication, please contact our office.   We will get a sleep referral.

## 2016-09-04 NOTE — Progress Notes (Signed)
Reason for visit: Memory disturbance  Referring physician: Dr. Danton Sewer I Whitney Henderson is a 83 y.o. female  History of present illness:  [name] is an 83 year old right-handed black female with a history of problems with sharp lancinating pains in the left brow that occur relatively infrequently, on average about 3 or 4 times a month. The patient has not required medications for this. She comes into the office today with a new problem, she indicates that she has been having some troubles with memory over the last 6 months, but this has worsened over the last one month. In parallel to the memory problems she has had increasing problems with fatigue, this has gotten much worse over the last one month. She sleeps 10 or 11 hours at night, but she feels very fatigued during the day, she does not nap however. She believes that she sleeps well at night, she may have to get up once or twice to use the bathroom. The patient also reports some underlying issues with depression and anxiety have also worsened over time. She is on low-dose Cymbalta taking 20 mg daily. She reports some mild gait instability, she does have occasional numbness in the left hand upon awakening in the morning, but this does not persist throughout the day. She tried to stand up, she is taking tai chi. The patient reports that she has not had given up any activities of daily living because of memory, she is able to operate a motor vehicle without difficulty. She is able do the finances, perform cooking, and keep up with medications and appointments. She does have some problems getting things organized during the day. She has had blood work done that includes a thyroid profile and a vitamin B12 level, these studies were unremarkable. The patient had a MRI of the brain done within the last year and a half that showed minimal white matter changes. Generalized cortical atrophy was noted.  Past Medical History:  Diagnosis Date  . Anxiety     . Cataracts, bilateral   . Headache 04/27/2015   Left>right periorbital  . Hypertension   . Memory difficulties 09/04/2016    Past Surgical History:  Procedure Laterality Date  . ABDOMINAL HYSTERECTOMY    . CHOLECYSTECTOMY    . EXCISIONAL HEMORRHOIDECTOMY      Family History  Problem Relation Age of Onset  . Cancer Father     Social history:  reports that she has quit smoking. She has never used smokeless tobacco. She reports that she does not drink alcohol or use drugs.  Medications:  Prior to Admission medications   Medication Sig Start Date End Date Taking? Authorizing Provider  ALPRAZolam (XANAX) 0.25 MG tablet Take 0.25 mg by mouth as needed. 02/29/16  Yes [provider]  amLODipine (NORVASC) 5 MG tablet  03/27/15  Yes [provider]  Ascorbic Acid (VITA-C PO) Take 1 Dose by mouth.   Yes [provider]  ASPIRIN 81 PO Take 1 tablet by mouth daily.   Yes [provider]  augmented betamethasone dipropionate (DIPROLENE-AF) 0.05 % cream Apply 1 application topically as needed. For itching 03/15/16  Yes [provider]  cholecalciferol (VITAMIN D) 1000 units tablet Take 1,000 Units by mouth daily.   Yes [provider]  DULoxetine (CYMBALTA) 20 MG capsule Take 20 mg by mouth daily. 04/29/16  Yes [provider]  metoprolol succinate (TOPROL-XL) 50 MG 24 hr tablet Take 50 mg by mouth daily. 01/19/15  Yes [provider]  Multiple Vitamins-Minerals (CENTRUM ADULTS PO) Take 1 tablet by mouth daily.   Yes [provider]  Propylene Glycol (SYSTANE BALANCE OP) Apply to eye. Uses when needed.   Yes [provider]  ibandronate (BONIVA) 150 MG tablet take 1 tablet by mouth every month 09/02/16   [provider]     No Known Allergies  ROS:  Out of a complete 14 system review of symptoms, the patient complains only of the following symptoms, and all other reviewed systems are  negative.  Memory disturbance Fatigue  Blood pressure (!) 150/58, pulse (!) 48, weight 148 lb 8 oz (67.4 kg), SpO2 98 %.  Physical Exam  General: The patient is alert and cooperative at the time of the examination.  Eyes: Pupils are equal, round, and reactive to light. Discs are flat bilaterally.  Neck: The neck is supple, no carotid bruits are noted.  Respiratory: The respiratory examination is clear.  Cardiovascular: The cardiovascular examination reveals a regular rate and rhythm, no obvious murmurs or rubs are noted.  Skin: Extremities are without significant edema.  Neurologic Exam  Mental status: The patient is alert and oriented x 3 at the time of the examination. The Mini-Mental Status Examination done today shows a total score 26/30.  Cranial nerves: Facial symmetry is present. There is good sensation of the face to pinprick and soft touch bilaterally. The strength of the facial muscles and the muscles to head turning and shoulder shrug are normal bilaterally. Speech is well enunciated, no aphasia or dysarthria is noted. Extraocular movements are full. Visual fields are full. The tongue is midline, and the patient has symmetric elevation of the soft palate. No obvious hearing deficits are noted.  Motor: The motor testing reveals 5 over 5 strength of all 4 extremities. Good symmetric motor tone is noted throughout.  Sensory: Sensory testing is intact to pinprick, soft touch, vibration sensation, and position sense on all 4 extremities. No evidence of extinction is noted.  Coordination: Cerebellar testing reveals good finger-nose-finger and heel-to-shin bilaterally.  Gait and station: Gait is normal. Tandem gait is slightly unsteady. Romberg is negative. No drift is seen.  Reflexes: Deep tendon reflexes are symmetric and normal bilaterally, with except that the ankle jerk reflexes are depressed bilaterally. Toes are downgoing bilaterally.   MRI brain  05/06/15:  IMPRESSION:  This MRI of the brain with and without contrast shows the following: 1.    Moderate cortical atrophy. 2.    Minimal T2 hyperintense changes in the periatrial white matter, consistent with age appropriate chronic microvascular ischemic change 3.    There are no acute findings.  * MRI scan images were reviewed online. I agree with the written report.    Assessment/Plan:  1. Mild memory disturbance  2. Chronic fatigue  3. Anxiety and depression  The patient has had some parallel with fatigue and with her memory problem. The patient could have worsening depression, she is on metoprolol but she has been on this medication for many years. 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 The patient will be increased on Cymbalta taking 30 mg daily for 2 weeks and then go to 30 mg twice daily. The patient will follow-up in this office in about 6 months. The patient will be sent for sleep evaluation to fully exclude any underlying sleep disorder. She sleeps 10-11 hours at night, and still feels fatigued throughout the day.  Jill Alexanders MD 09/04/2016 9:34 AM  [facility] [address] [place], [place] [address]  Phone [phone] Fax [phone]

## 2016-09-16 ENCOUNTER — Other Ambulatory Visit: Payer: Self-pay | Admitting: Internal Medicine

## 2016-09-16 DIAGNOSIS — Z1231 Encounter for screening mammogram for malignant neoplasm of breast: Secondary | ICD-10-CM

## 2016-10-03 ENCOUNTER — Telehealth: Payer: Self-pay | Admitting: Neurology

## 2016-10-03 ENCOUNTER — Encounter: Payer: Self-pay | Admitting: Neurology

## 2016-10-03 ENCOUNTER — Ambulatory Visit (INDEPENDENT_AMBULATORY_CARE_PROVIDER_SITE_OTHER): Payer: Medicare Other | Admitting: Neurology

## 2016-10-03 VITALS — BP 143/81 | HR 84 | Ht 62.0 in | Wt 146.0 lb

## 2016-10-03 DIAGNOSIS — G478 Other sleep disorders: Secondary | ICD-10-CM | POA: Diagnosis not present

## 2016-10-03 DIAGNOSIS — G479 Sleep disorder, unspecified: Secondary | ICD-10-CM

## 2016-10-03 DIAGNOSIS — R351 Nocturia: Secondary | ICD-10-CM

## 2016-10-03 DIAGNOSIS — G4719 Other hypersomnia: Secondary | ICD-10-CM

## 2016-10-03 NOTE — Patient Instructions (Signed)
Based on your symptoms and your exam I believe we should look for an underlying organic sleep disorder, such as obstructive sleep apnea/OSA, or dream enactments, or periodic leg movement disorder (PLMD), which is leg twitching in sleep.   If you have more than mild OSA, I want you to consider treatment with CPAP. Please remember, the risks and ramifications of moderate to severe obstructive sleep apnea or OSA are: Cardiovascular disease, including congestive heart failure, stroke, difficult to control hypertension, arrhythmias, and even type 2 diabetes has been linked to untreated OSA. Sleep apnea causes disruption of sleep and sleep deprivation in most cases, which, in turn, can cause recurrent headaches, problems with memory, mood, concentration, focus, and vigilance. Most people with untreated sleep apnea report excessive daytime sleepiness, which can affect their ability to drive. Please do not drive if you feel sleepy.   I will likely see you back after your sleep study. We will update you on the phone as well.  Our sleep lab administrative assistant, Arrie Aran will call you to schedule your sleep study. If you don't hear back from her by about 2 weeks from now, please feel free to call her at [phone]. This is her direct line and please leave a message with your phone number to call back if you get the voicemail box. She will call back as soon as possible.

## 2016-10-03 NOTE — Progress Notes (Signed)
Subjective:    Patient ID: Whitney Henderson is a 82 y.o. female.  HPI     Star Age, MD, PhD [facility] [address], [address] [place], [place] [address]  Dear [name],   I saw your patient, [name], upon your kind request in my clinic today for a sleep consultation. The patient is accompanied by her daughter, [name], today. As you know, [name] is an 83 year old right-handed woman with an underlying medical history of anxiety, depression, memory loss, hypertension, cataracts, and mildly overweight state, who reports daytime somnolence despite achieving sleep at night. I reviewed your office note from 09/04/2016. Her Epworth sleepiness score is 9 out of 24 today, her fatigue score is 47 out of 63. She lives at home, she is retired, was a Pharmacist, hospital. She quit smoking over 30 years ago. She does not drink alcohol on a regular basis. She drinks coffee, one cup in the morning. She reports no difficulty falling asleep or staying asleep but does have nocturia about twice per average night, denies restless leg symptoms or leg movements at night and denies morning headaches. She is not known to snore. Her daughter also called patient's husband who denied any significant snoring noted and patient. Her daughter is worried that since patient has increased her Cymbalta to first 30 mg once daily and now twice daily patient has been more drowsy during the day and off-balance, patient also reports global weakness. Daughter is hoping to get some advice as to whether the Cymbalta could be reduced. She does not have a set bedtime or wake up time routine currently.   Her Past Medical History Is Significant For: Past Medical History:  Diagnosis Date  . Anxiety   . Cataracts, bilateral   . Headache 04/27/2015   Left>right periorbital  . Hypertension   . Memory difficulties 09/04/2016    Her Past Surgical History Is Significant For: Past Surgical History:  Procedure  Laterality Date  . ABDOMINAL HYSTERECTOMY    . CHOLECYSTECTOMY    . EXCISIONAL HEMORRHOIDECTOMY      Her Family History Is Significant For: Family History  Problem Relation Age of Onset  . Cancer Father     Her Social History Is Significant For: Social History   Social History  . Marital status: Married    Spouse name: Izell    . Number of children: 4  . Years of education: [employer] History Main Topics  . Smoking status: Former Research scientist (life sciences)  . Smokeless tobacco: Never Used  . Alcohol use No  . Drug use: No  . Sexual activity: Not Asked   Other Topics Concern  . None   Social History Narrative   Live with husband,  4 children.    Caffeine: 1 cup daily     Her Allergies Are:  No Known Allergies:   Her Current Medications Are:  Outpatient Encounter Prescriptions as of 10/03/2016  Medication Sig  . ALPRAZolam (XANAX) 0.25 MG tablet Take 0.25 mg by mouth as needed.  [name] amLODipine (NORVASC) 5 MG tablet   . Ascorbic Acid (VITA-C PO) Take 1 Dose by mouth.  . ASPIRIN 81 PO Take 1 tablet by mouth daily.  [name] augmented betamethasone dipropionate (DIPROLENE-AF) 0.05 % cream Apply 1 application topically as needed. For itching  . cholecalciferol (VITAMIN D) 1000 units tablet Take 1,000 Units by mouth daily.  . DULoxetine (CYMBALTA) 30 MG capsule One tablet daily for 2 weeks, then take one tablet twice a  day  . ibandronate (BONIVA) 150 MG tablet take 1 tablet by mouth every month  . metoprolol succinate (TOPROL-XL) 50 MG 24 hr tablet Take 50 mg by mouth daily.  . Multiple Vitamins-Minerals (CENTRUM ADULTS PO) Take 1 tablet by mouth daily.  [name] Propylene Glycol (SYSTANE BALANCE OP) Apply to eye. Uses when needed.   No facility-administered encounter medications on file as of 10/03/2016.   :  Review of Systems:  Out of a complete 14 point review of systems, all are reviewed and negative with the exception of these symptoms as listed below: Review of Systems   Neurological:       Pt presents today to discuss her sleep. Pt has never had a sleep study. Pt thinks "things are going on her head."   Epworth Sleepiness Scale 0= would never doze 1= slight chance of dozing 2= moderate chance of dozing 3= high chance of dozing  Sitting and reading: 2 Watching TV: 2 Sitting inactive in a public place (ex. Theater or meeting): 1 As a passenger in a car for an hour without a break: 1 Lying down to rest in the afternoon: 3 Sitting and talking to someone: 0 Sitting quietly after lunch (no alcohol): 0 In a car, while stopped in traffic: 0 Total: 9     Objective:  Neurological Exam  Physical Exam Physical Examination:   Vitals:   10/03/16 1048  BP: (!) 143/81  Pulse: 84   General Examination: The patient is a very pleasant 83 y.o. female in no acute distress. She appears well-developed and well-nourished and well groomed.   HEENT: Normocephalic, atraumatic, pupils are equal, round and reactive to light and accommodation. Extraocular tracking is good without limitation to gaze excursion or nystagmus noted. Normal smooth pursuit is noted. Hearing is grossly intact. Face is symmetric with normal facial animation and normal facial sensation. Speech is clear with no dysarthria noted. There is no hypophonia. There is no lip, neck/head, jaw or voice tremor. Neck is supple with full range of passive and active motion. There are no carotid bruits on auscultation. Oropharynx exam reveals: mild mouth dryness, adequate dental hygiene andNo significant airway crowding. Neck circumference is slender, 13-1/2 inches.  Chest: Clear to auscultation without wheezing, rhonchi or crackles noted.  Heart: S1+S2+0, regular and normal without murmurs, rubs or gallops noted.   Abdomen: Soft, non-tender and non-distended with normal bowel sounds appreciated on auscultation.  Extremities: There is no pitting edema in the distal lower extremities bilaterally. Pedal pulses  are intact.  Skin: Warm and dry without trophic changes noted.  Musculoskeletal: exam reveals no obvious joint deformities, tenderness or joint swelling or erythema.   Neurologically:  Mental status: The patient is awake, alert and oriented in all 4 spheres. Her immediate and remote memory, attention, language skills and fund of knowledge are appropriate. There is no evidence of aphasia, agnosia, apraxia or anomia. Speech is clear with normal prosody and enunciation. Thought process is linear. Mood is normal and affect is normal.  Cranial nerves II - XII are as described above under HEENT exam. In addition: shoulder shrug is normal with equal shoulder height noted. Motor exam: Normal bulk, strength and tone is noted. There is no drift, tremor or rebound. Romberg is negative. Reflexes are 1+. Fine motor skills and coordination: Grossly intact for age. Sensory exam is intact to light touch. Gait, station and balance are unremarkable for age.   Assessment and Plan:  In summary, Vaunda I Chico is a very pleasant 83  y.o.-year old female with an underlying medical history of anxiety, depression, memory loss, hypertension, cataracts, and mildly overweight state, who presents for sleep consultation for daytime somnolence. Her symptoms are rather vague. Her daughter is worried that since increasing Cymbalta to 30 mg twice a day patient has been more off balance and drowsy. I suggested that you could consider reducing the Cymbalta to some degree. I advised them that they would hear back from you or your nurse.  We can certainly proceed with a sleep study to look for an underlying organic cause of her daytime somnolence. It is possible that she has some medication side effects and we can certainly look into that as well, as I explained to the patient and her daughter. She does not give a telltale history of obstructive sleep apnea or restless leg syndrome. Nevertheless, we will know more after her sleep  study.  I explained the sleep test procedure to the patient and would like to see her back after the sleep study is completed and encouraged them to call with any interim questions, concerns, problems or updates.   Thank you very much for allowing me to participate in the care of this nice patient. If I can be of any further assistance to you please do not hesitate to talk to me.   Sincerely,   Star Age, MD, PhD

## 2016-10-03 NOTE — Telephone Encounter (Signed)
I called the patient, left a message. The report from Dr. Rexene Alberts indicated that the patient was having some drowsiness and the sensation of being off balance on Cymbalta, I would have her take the 30 mg tablets in the evening rather than during the daytime to see if the a time drowsiness can be improved. If she still has ongoing side effects, she is to contact our office.

## 2016-10-16 ENCOUNTER — Ambulatory Visit
Admission: RE | Admit: 2016-10-16 | Discharge: 2016-10-16 | Disposition: A | Discharge status: Home / Self Care | Payer: Medicare Other | Source: Ambulatory Visit | Attending: Internal Medicine | Admitting: Internal Medicine

## 2016-10-16 DIAGNOSIS — Z1231 Encounter for screening mammogram for malignant neoplasm of breast: Secondary | ICD-10-CM

## 2016-11-11 ENCOUNTER — Ambulatory Visit (INDEPENDENT_AMBULATORY_CARE_PROVIDER_SITE_OTHER): Payer: Medicare Other | Admitting: Neurology

## 2016-11-11 DIAGNOSIS — G4710 Hypersomnia, unspecified: Secondary | ICD-10-CM

## 2016-11-11 DIAGNOSIS — R351 Nocturia: Secondary | ICD-10-CM

## 2016-11-11 DIAGNOSIS — G479 Sleep disorder, unspecified: Secondary | ICD-10-CM

## 2016-11-11 DIAGNOSIS — G478 Other sleep disorders: Secondary | ICD-10-CM

## 2016-11-11 DIAGNOSIS — R0683 Snoring: Secondary | ICD-10-CM

## 2016-11-11 DIAGNOSIS — G4719 Other hypersomnia: Secondary | ICD-10-CM

## 2016-11-12 NOTE — Progress Notes (Signed)
Patient referred by Dr. Jannifer Franklin, seen by me on 10/03/16 for sleepiness, diagnostic PSG on 11/11/16.   Please call and notify the patient or her daughter that the recent sleep study did not show any significant obstructive sleep apnea or sleep disorder, in fact, she sleep rather well, achieved all stages of sleep and no significant desaturations. As discussed, her daytime sleepiness may stem from medication effect. She can FU with Dr. Jannifer Franklin as planned.  Once you have spoken to patient, you can close this encounter.   Thanks,  Star Age, MD, PhD [facility])

## 2016-11-12 NOTE — Procedures (Signed)
PATIENT'S NAME:  Whitney Henderson, Whitney Henderson DOB:      07/20/1928      MR#:    [id number]     DATE OF RECORDING: 11/11/2016 REFERRING M.D.: Dr. Floyde Parkins,      PCP: Jeanann Lewandowsky, MD Study Performed:   Baseline Polysomnogram HISTORY: 83 year old woman with a history of anxiety, depression, memory loss, hypertension, cataracts, and mildly overweight state, who reports daytime somnolence despite achieving sleep at night. The patient endorsed the Epworth Sleepiness Scale at 9 points. The patient's weight 146 pounds with a height of 62 (inches), resulting in a BMI of 26.8 kg/m2. The patient's neck circumference measured 13.5 inches.  CURRENT MEDICATIONS: Xanax, Norvasc, Vita-C, Aspirin, Diprolene-AF, Vitamin D, Cymbalta, Boniva, Toprol XL, Multivitamins, Systane.    PROCEDURE:  This is a multichannel digital polysomnogram utilizing the Somnostar 11.2 system.  Electrodes and sensors were applied and monitored per AASM Specifications.   EEG, EOG, Chin and Limb EMG, were sampled at 200 Hz.  ECG, Snore and Nasal Pressure, Thermal Airflow, Respiratory Effort, CPAP Flow and Pressure, Oximetry was sampled at 50 Hz. Digital video and audio were recorded.      BASELINE STUDY  Lights Out was at 21:15 and Lights On at 05:16.  Total recording time (TRT) was 481.5 minutes, with a total sleep time (TST) of  390 minutes.   The patient's sleep latency was 59 minutes which is delayed. REM latency was 116.5 minutes, which is mildly delayed. The sleep efficiency was 81. %.     SLEEP ARCHITECTURE: WASO (Wake after sleep onset) was 32.5 minutes with mild sleep fragmentation in the beginning, then good sleep consolidation. There were 8.5 minutes in Stage N1, 213.5 minutes Stage N2, 81 minutes Stage N3 and 87 minutes in Stage REM.  The percentage of Stage N1 was 2.2%, Stage N2 was 54.7%, which is normal, Stage N3 was 20.8%, which is normal, and Stage R (REM sleep) was 22.3% which is normal. The arousals were noted as: 59 were  spontaneous, 0 were associated with PLMs, 0 were associated with respiratory events.    Audio and video analysis did not show any abnormal or unusual movements, behaviors, phonations or vocalizations. The patient took one bathroom break. Soft intermittent snoring was noted. The EKG was in keeping with normal sinus rhythm (NSR).  RESPIRATORY ANALYSIS:  There were a total of 0 respiratory events:  0 obstructive apneas, 0 central apneas and 0 mixed apneas with a total of 0 apneas and an apnea index (AI) of 0 /hour. There were 0 hypopneas with a hypopnea index of 0 /hour. The patient also had 0 respiratory event related arousals (RERAs).      The total APNEA/HYPOPNEA INDEX (AHI) was 0/hour and the total RESPIRATORY DISTURBANCE INDEX was 0 /hour.  0 events occurred in REM sleep and 0 events in NREM. The REM AHI was 0 /hour, versus a non-REM AHI of 0. The patient spent 147.5 minutes of total sleep time in the supine position and 243 minutes in non-supine.. The supine AHI was 0.0 versus a non-supine AHI of 0.0.  OXYGEN SATURATION & C02:  The Wake baseline 02 saturation was 98%, with the lowest being 90%. Time spent below 89% saturation equaled 0 minutes.  PERIODIC LIMB MOVEMENTS: The patient had a total of 0 Periodic Limb Movements.  The Periodic Limb Movement (PLM) index was 0 and the PLM Arousal index was 0/hour.  Post-study, the patient indicated that sleep was the same as usual.   IMPRESSION:  1.  Primary Snoring 2. Repetitive Intrusions of Sleep  RECOMMENDATIONS:  1. This study does not demonstrate any significant obstructive or central sleep disordered breathing and only minimal snoring. This study does not support an intrinsic sleep disorder as a cause of the patient's symptoms. Other causes, including circadian rhythm disturbances, an underlying mood disorder, medication effect and/or an underlying medical problem cannot be ruled out. Mild increase/borderline arousals were noted; this is a  nonspecific finding and not unusual for a sleep study setting.  2. The patient should be cautioned not to drive, work at heights, or operate dangerous or heavy equipment when tired or sleepy. Review and reiteration of good sleep hygiene measures should be pursued with any patient. 3. The patient can follow-up with her referring provider, who will be notified of the test results.   Henderson certify that Henderson have reviewed the entire raw data recording prior to the issuance of this report in accordance with the Standards of Accreditation of the American Academy of Sleep Medicine (AASM)    Star Age, MD, PhD Diplomat, American Board of Psychiatry and Neurology (Neurology and Sleep Medicine)

## 2016-11-13 ENCOUNTER — Telehealth: Payer: Self-pay

## 2016-11-13 NOTE — Telephone Encounter (Signed)
I called pt. I advised her that her sleep study did not show any significant osa or a sleep disorder, and pt slept well during her study and achieved all sleep stages and had no significant desaturations. Pt's daytime sleepiness may stem from her medications. Dr. Rexene Alberts recommended that pt follow up with Dr. Jannifer Franklin as planned. Pt verbalized understanding of results. Pt had no questions at this time but was encouraged to call back if questions arise.

## 2016-11-13 NOTE — Telephone Encounter (Signed)
-----   Message from Star Age, MD sent at 11/12/2016  8:03 AM EDT ----- Patient referred by Dr. Jannifer Franklin, seen by me on 10/03/16 for sleepiness, diagnostic PSG on 11/11/16.   Please call and notify the patient or her daughter that the recent sleep study did not show any significant obstructive sleep apnea or sleep disorder, in fact, she sleep rather well, achieved all stages of sleep and no significant desaturations. As discussed, her daytime sleepiness may stem from medication effect. She can FU with Dr. Jannifer Franklin as planned.  Once you have spoken to patient, you can close this encounter.   Thanks,  Star Age, MD, PhD [facility])

## 2016-11-14 ENCOUNTER — Encounter (HOSPITAL_COMMUNITY): Payer: Self-pay | Admitting: Nurse Practitioner

## 2016-11-14 DIAGNOSIS — Z79899 Other long term (current) drug therapy: Secondary | ICD-10-CM | POA: Insufficient documentation

## 2016-11-14 DIAGNOSIS — I10 Essential (primary) hypertension: Secondary | ICD-10-CM | POA: Diagnosis not present

## 2016-11-14 DIAGNOSIS — Z87891 Personal history of nicotine dependence: Secondary | ICD-10-CM | POA: Insufficient documentation

## 2016-11-14 NOTE — ED Triage Notes (Signed)
Patient presents with daughter for evaluation of hypertension. Pt sts BP medications was recently lowered due to blurry vision and lightheadedness in the morning. Pt noted today BP was elevated. Pt denies unilateral weakness, facial droop, speech/vision/paresthesia changes. Pt denies headache, cp, shob. Pt appears in NAD.

## 2016-11-15 ENCOUNTER — Emergency Department (HOSPITAL_COMMUNITY)
Admission: EM | Admit: 2016-11-15 | Discharge: 2016-11-15 | Disposition: A | Discharge status: Home / Self Care | Payer: Medicare Other | Attending: Emergency Medicine | Admitting: Emergency Medicine

## 2016-11-15 ENCOUNTER — Other Ambulatory Visit: Payer: Self-pay

## 2016-11-15 DIAGNOSIS — I160 Hypertensive urgency: Secondary | ICD-10-CM

## 2016-11-15 LAB — CBC WITH DIFFERENTIAL/PLATELET
BASOS ABS: 0 10*3/uL (ref 0.0–0.1)
BASOS PCT: 1 %
EOS ABS: 0.2 10*3/uL (ref 0.0–0.7)
EOS PCT: 4 %
HCT: 35.5 % — ABNORMAL LOW (ref 36.0–46.0)
Hemoglobin: 11.6 g/dL — ABNORMAL LOW (ref 12.0–15.0)
LYMPHS PCT: 48 %
Lymphs Abs: 2 10*3/uL (ref 0.7–4.0)
MCH: 27.9 pg (ref 26.0–34.0)
MCHC: 32.7 g/dL (ref 30.0–36.0)
MCV: 85.3 fL (ref 78.0–100.0)
MONO ABS: 0.4 10*3/uL (ref 0.1–1.0)
Monocytes Relative: 10 %
Neutro Abs: 1.6 10*3/uL — ABNORMAL LOW (ref 1.7–7.7)
Neutrophils Relative %: 37 %
PLATELETS: 183 10*3/uL (ref 150–400)
RBC: 4.16 MIL/uL (ref 3.87–5.11)
RDW: 15.6 % — AB (ref 11.5–15.5)
WBC: 4.2 10*3/uL (ref 4.0–10.5)

## 2016-11-15 LAB — BASIC METABOLIC PANEL
Anion gap: 8 (ref 5–15)
BUN: 16 mg/dL (ref 6–20)
CALCIUM: 8.7 mg/dL — AB (ref 8.9–10.3)
CO2: 23 mmol/L (ref 22–32)
CREATININE: 1.21 mg/dL — AB (ref 0.44–1.00)
Chloride: 108 mmol/L (ref 101–111)
GFR calc Af Amer: 46 mL/min — ABNORMAL LOW (ref >60)
GFR, EST NON AFRICAN AMERICAN: 39 mL/min — AB (ref >60)
Glucose, Bld: 131 mg/dL — ABNORMAL HIGH (ref 65–99)
POTASSIUM: 3.8 mmol/L (ref 3.5–5.1)
SODIUM: 139 mmol/L (ref 135–145)

## 2016-11-15 LAB — I-STAT TROPONIN, ED: Troponin i, poc: 0 ng/mL (ref 0.00–0.08)

## 2016-11-15 MED ORDER — AMLODIPINE BESYLATE 5 MG PO TABS: 5.0000 mg | ORAL_TABLET | Freq: Every day | ORAL | 0 refills | Status: DC

## 2016-11-15 MED ORDER — AMLODIPINE BESYLATE 5 MG PO TABS
5.0000 mg | ORAL_TABLET | Freq: Once | ORAL | Status: AC
  Administered 2016-11-15: 5 mg via ORAL
  Filled 2016-11-15: qty 1

## 2016-11-15 NOTE — Discharge Instructions (Signed)
Resume Norvasc as previously prescribed.  Follow-up with your primary doctor next week after making a record of your blood pressures for the next several days.  Return to the emergency department if you experience severe headache, chest pain, difficulty breathing, or other new and concerning symptoms.

## 2016-11-15 NOTE — ED Provider Notes (Signed)
[facility] EMERGENCY DEPARTMENT Provider Note   CSN: [id number] Arrival date & time: 11/14/16  1850     History   Chief Complaint Chief Complaint  Patient presents with  . Hypertension    HPI Whitney Henderson is a 83 y.o. female.  Patient is an 83 year old female with past medical history of hypertension, cataracts presenting for evaluation of elevated blood pressure.  The daughter who is present at bedside reports her medications were recently changed and believes that the Norvasc she was previously taking was stopped.  Since then she has been running an elevated blood pressure to the 638 systolic range.  She denies any chest pain or difficulty breathing.  She does report feeling "fuzzy in the head" recently.  The daughter checked her blood pressure this evening and it was 756 systolic and brings her for evaluation.   The history is provided by the patient.  Hypertension  This is a new problem. The current episode started more than 1 week ago. The problem occurs constantly. The problem has been gradually worsening. Pertinent negatives include no chest pain, no headaches and no shortness of breath. Nothing aggravates the symptoms. Nothing relieves the symptoms. She has tried nothing for the symptoms.    Past Medical History:  Diagnosis Date  . Anxiety   . Cataracts, bilateral   . Headache 04/27/2015   Left>right periorbital  . Hypertension   . Memory difficulties 09/04/2016    Patient Active Problem List   Diagnosis Date Noted  . Memory difficulties 09/04/2016  . Headache 04/27/2015  . Hypertension 01/16/2013  . UTI (lower urinary tract infection) 01/16/2013    Past Surgical History:  Procedure Laterality Date  . ABDOMINAL HYSTERECTOMY    . CHOLECYSTECTOMY    . EXCISIONAL HEMORRHOIDECTOMY      OB History    No data available       Home Medications    Prior to Admission medications   Medication Sig Start Date End Date Taking? Authorizing  Provider  ALPRAZolam Duanne Moron) 0.25 MG tablet Take 0.25 mg by mouth as needed for anxiety.  02/29/16  Yes [provider]  ASPIRIN 81 PO Take 81 mg by mouth as needed (for pain or headache).    Yes [provider]  augmented betamethasone dipropionate (DIPROLENE-AF) 0.05 % cream Apply 1 application topically as needed (for itching).  03/15/16  Yes [provider]  Bioflavonoid Products (VITAMIN C) CHEW Chew 1 tablet by mouth every 30 (thirty) days.   Yes [provider]  cholecalciferol (VITAMIN D) 1000 units tablet Take 1,000 Units by mouth every 30 (thirty) days.    Yes [provider]  ibandronate (BONIVA) 150 MG tablet Take 150 mg by mouth every 30 days 09/02/16  Yes [provider]  ibuprofen (ADVIL,MOTRIN) 200 MG tablet Take 200 mg by mouth every 6 (six) hours as needed for mild pain.   Yes [provider]  metoprolol succinate (TOPROL-XL) 50 MG 24 hr tablet Take 50 mg by mouth daily. 11/04/16  Yes [provider]  Multiple Vitamins-Minerals (CENTRUM ADULTS PO) Take 1 tablet by mouth every other day.    Yes [provider]  Propylene Glycol (SYSTANE BALANCE OP) Place 1 drop into both eyes daily as needed (for itchy eyes). Uses when needed.    Yes [provider]  amLODipine (NORVASC) 5 MG tablet Take 5 mg by mouth daily.  03/27/15   [provider]  DULoxetine (CYMBALTA) 30 MG capsule One tablet daily  for 2 weeks, then take one tablet twice a day Patient not taking: Reported on 11/14/2016 09/04/16   [name], MD    Family History Family History  Problem Relation Age of Onset  . Cancer Father   . Breast cancer Maternal Grandmother     Social History Social History  Substance Use Topics  . Smoking status: Former Research scientist (life sciences)  . Smokeless tobacco: Never Used  . Alcohol use No     Allergies   Patient has no known allergies.   Review of Systems Review of Systems  Respiratory: Negative  for shortness of breath.   Cardiovascular: Negative for chest pain.  Neurological: Negative for headaches.  All other systems reviewed and are negative.    Physical Exam Updated Vital Signs BP (!) 188/81 (BP Location: Left Arm)   Pulse 93   Temp (!) 97.5 F (36.4 C) (Oral)   Resp 18   Ht 5\' 2"  (1.575 m)   Wt 70.3 kg (155 lb)   SpO2 100%   BMI 28.35 kg/m   Physical Exam  Constitutional: She is oriented to person, place, and time. She appears well-developed and well-nourished. No distress.  HENT:  Head: Normocephalic and atraumatic.  Eyes: Pupils are equal, round, and reactive to light. EOM are normal.  Neck: Normal range of motion. Neck supple.  Cardiovascular: Normal rate and regular rhythm.  Exam reveals no gallop and no friction rub.   No murmur heard. Pulmonary/Chest: Effort normal and breath sounds normal. No respiratory distress. She has no wheezes.  Abdominal: Soft. Bowel sounds are normal. She exhibits no distension. There is no tenderness.  Musculoskeletal: Normal range of motion. She exhibits no edema.  Neurological: She is alert and oriented to person, place, and time. No cranial nerve deficit. She exhibits normal muscle tone. Coordination normal.  Skin: Skin is warm and dry. She is not diaphoretic.  Nursing note and vitals reviewed.    ED Treatments / Results  Labs (all labs ordered are listed, but only abnormal results are displayed) Labs Reviewed  BASIC METABOLIC PANEL  CBC WITH DIFFERENTIAL/PLATELET  I-STAT TROPONIN, ED    EKG  EKG Interpretation None       Radiology No results found.  Procedures Procedures (including critical care time)  Medications Ordered in ED Medications - No data to display   Initial Impression / Assessment and Plan / ED Course  I have reviewed the triage vital signs and the nursing notes.  Pertinent labs & imaging results that were available during my care of the patient were reviewed by me and considered in my  medical decision making (see chart for details).  Patient is an 83 year old female who presents with elevated blood pressure.  She was apparently taken off of her Norvasc several weeks ago.  Her workup reveals no significant abnormalities and EKG is unchanged.  Most recent blood pressure is 749 systolic over 97 diastolic.  I do not see an indication for admission.  My advice will be to keep a record of her blood pressures and restart her Norvasc.  She is to follow-up with her primary doctor next week for a recheck and possible medication adjustment.  Final Clinical Impressions(s) / ED Diagnoses   Final diagnoses:  None    New Prescriptions New Prescriptions   No medications on file     Veryl Speak, MD 11/15/16 [phone]

## 2017-03-10 ENCOUNTER — Ambulatory Visit: Payer: Medicare Other | Admitting: Neurology

## 2017-06-25 ENCOUNTER — Other Ambulatory Visit: Payer: Self-pay | Admitting: Internal Medicine

## 2017-06-25 ENCOUNTER — Ambulatory Visit
Admission: RE | Admit: 2017-06-25 | Discharge: 2017-06-25 | Disposition: A | Discharge status: Home / Self Care | Payer: Medicare Other | Source: Ambulatory Visit | Attending: Internal Medicine | Admitting: Internal Medicine

## 2017-06-25 DIAGNOSIS — M549 Dorsalgia, unspecified: Secondary | ICD-10-CM

## 2017-06-27 ENCOUNTER — Other Ambulatory Visit: Payer: Self-pay | Admitting: Internal Medicine

## 2017-06-27 DIAGNOSIS — N644 Mastodynia: Secondary | ICD-10-CM

## 2017-07-02 ENCOUNTER — Ambulatory Visit
Admission: RE | Admit: 2017-07-02 | Discharge: 2017-07-02 | Disposition: A | Discharge status: Home / Self Care | Payer: Medicare Other | Source: Ambulatory Visit | Attending: Internal Medicine | Admitting: Internal Medicine

## 2017-07-02 ENCOUNTER — Ambulatory Visit: Payer: Medicare Other

## 2017-07-02 DIAGNOSIS — N644 Mastodynia: Secondary | ICD-10-CM

## 2017-09-08 LAB — VITAMIN B12: Vitamin B-12: 929

## 2017-09-10 ENCOUNTER — Other Ambulatory Visit: Payer: Self-pay | Admitting: Internal Medicine

## 2017-09-10 DIAGNOSIS — Z1231 Encounter for screening mammogram for malignant neoplasm of breast: Secondary | ICD-10-CM

## 2017-10-11 ENCOUNTER — Encounter: Payer: Self-pay | Admitting: Internal Medicine

## 2017-10-11 DIAGNOSIS — F419 Anxiety disorder, unspecified: Secondary | ICD-10-CM | POA: Insufficient documentation

## 2017-10-21 ENCOUNTER — Ambulatory Visit
Admission: RE | Admit: 2017-10-21 | Discharge: 2017-10-21 | Disposition: A | Discharge status: Home / Self Care | Payer: Medicare Other | Source: Ambulatory Visit | Attending: Internal Medicine | Admitting: Internal Medicine

## 2017-10-21 DIAGNOSIS — Z1231 Encounter for screening mammogram for malignant neoplasm of breast: Secondary | ICD-10-CM | POA: Diagnosis not present

## 2017-10-30 ENCOUNTER — Ambulatory Visit (INDEPENDENT_AMBULATORY_CARE_PROVIDER_SITE_OTHER): Payer: Medicare Other | Admitting: Internal Medicine

## 2017-10-30 ENCOUNTER — Ambulatory Visit (INDEPENDENT_AMBULATORY_CARE_PROVIDER_SITE_OTHER): Payer: Medicare Other

## 2017-10-30 ENCOUNTER — Encounter: Payer: Self-pay | Admitting: Internal Medicine

## 2017-10-30 VITALS — BP 116/64 | HR 56 | Temp 97.6°F | Ht 59.5 in | Wt 133.6 lb

## 2017-10-30 DIAGNOSIS — F329 Major depressive disorder, single episode, unspecified: Secondary | ICD-10-CM

## 2017-10-30 DIAGNOSIS — R001 Bradycardia, unspecified: Secondary | ICD-10-CM | POA: Diagnosis not present

## 2017-10-30 DIAGNOSIS — I10 Essential (primary) hypertension: Secondary | ICD-10-CM | POA: Insufficient documentation

## 2017-10-30 DIAGNOSIS — Z0000 Encounter for general adult medical examination without abnormal findings: Secondary | ICD-10-CM

## 2017-10-30 NOTE — Patient Instructions (Signed)
1-Check your blood pressure every morning and your pulse when you get up, and write down how you feel at that moment. 2-Then re-check your blood pressure   And pulse at noon( 4-5 hours after taking your Amlodipine) and write down your symptoms.   ONES WE HAVE ALL THOSE NUMBERS WE CAN SEE IF YOU NEED TO CONTINUE THE BLOOD PRESSURE MEDICATION OR NOT  3- I'm increasing your sertraline ( zoloft) to 50 mg a day to help with your sadness a little better.

## 2017-10-30 NOTE — Progress Notes (Addendum)
Subjective:     Patient ID: Whitney Henderson , female    DOB: 04-30-1928 , 84 y.o.   MRN: [id number]   Pt is here for HTN FU. She feels her Amlodipine needs to be changed. " I dont feel well when I take it, I cant describe it" She denies feeling light headed or dizzy when she gets up from sitting or laying, but in the am's when she wakes up, she sits at the edge of her bed and feels cold, and the room temp is not cold. Does not get chest pains, SOB or diaphoresis during this time. She has done BP diaries for [place] to see after she took her BP med and been around 110-116/ 70-80's. Only a couple of times in the 130's and ones was 846 systolic.  She also wants to discuss her last abnormal labs and find out about referral to a specialist( hematologist) that Dr Baird Cancer sent her to. She has not heard from them yet.     Past Medical History:  Diagnosis Date  . Anxiety   . Cataracts, bilateral   . Headache 04/27/2015   Left>right periorbital  . Hypertension   . Memory difficulties 09/04/2016      Current Outpatient Medications:  .  Alpha-D-Galactosidase (BEANO MELTAWAYS) TABS, Take by mouth., Disp: , Rfl:  .  amLODipine (NORVASC) 5 MG tablet, Take 1 tablet (5 mg total) by mouth daily., Disp: 30 tablet, Rfl: 0 .  ASPIRIN 81 PO, Take 81 mg by mouth as needed (for pain or headache). , Disp: , Rfl:  .  augmented betamethasone dipropionate (DIPROLENE-AF) 0.05 % cream, Apply 1 application topically as needed (for itching). , Disp: , Rfl:  .  betamethasone dipropionate (DIPROLENE) 0.05 % cream, Apply topically 2 (two) times daily., Disp: , Rfl:  .  Bioflavonoid Products (VITAMIN C) CHEW, Chew 1 tablet by mouth every 30 (thirty) days., Disp: , Rfl:  .  cholecalciferol (VITAMIN D) 1000 units tablet, Take 1,000 Units by mouth every 30 (thirty) days. , Disp: , Rfl:  .  DULoxetine (CYMBALTA) 30 MG capsule, One tablet daily for 2 weeks, then take one tablet twice a day (Patient not taking: Reported on 11/14/2016),  Disp: 60 capsule, Rfl: 3 .  ibandronate (BONIVA) 150 MG tablet, Take 150 mg by mouth every 30 days, Disp: , Rfl: 0 .  ibuprofen (ADVIL,MOTRIN) 200 MG tablet, Take 200 mg by mouth every 6 (six) hours as needed for mild pain., Disp: , Rfl:  .  metoprolol succinate (TOPROL-XL) 50 MG 24 hr tablet, Take 50 mg by mouth daily., Disp: , Rfl: 0 .  Multiple Vitamins-Minerals (CENTRUM ADULTS PO), Take 1 tablet by mouth every other day. , Disp: , Rfl:  .  Polyethyl Glycol-Propyl Glycol (SYSTANE) 0.4-0.3 % SOLN, Apply to eye., Disp: , Rfl:  .  Propylene Glycol (SYSTANE BALANCE OP), Place 1 drop into both eyes daily as needed (for itchy eyes). Uses when needed. , Disp: , Rfl:  .  sertraline (ZOLOFT) 25 MG tablet, Take 25 mg by mouth daily., Disp: , Rfl:    Review of Systems  Constitutional: Positive for appetite change and fatigue. Negative for chills, diaphoresis and fever.       Fatigue in am, ones moves around feels fine.   HENT: Negative for tinnitus.   Respiratory: Negative for chest tightness and shortness of breath.   Gastrointestinal: Negative.  Negative for constipation, diarrhea and nausea.  Endocrine: Negative for cold intolerance and heat intolerance.  Feels cold when she gets up in the am  Genitourinary: Negative.  Negative for dysuria, frequency and urgency.       She gets intermittent L breast soreness off and on  Musculoskeletal: Positive for back pain. Negative for gait problem.       L mid lumbar back pain for years  Skin: Negative for rash.  Neurological: Negative for dizziness, tremors, syncope, weakness, light-headedness, numbness and headaches.  Psychiatric/Behavioral: Negative for agitation, confusion and suicidal ideas.       Still feels sad, not crying as much. She takes to her husband when she looks at his picture and seems this helps her cope with her loss.      There were no vitals filed for this visit. There is no height or weight on file to calculate BMI.    Objective:  Physical Exam  Constitutional: She is oriented to person, place, and time. She appears well-developed and well-nourished. No distress.  HENT:  Head: Normocephalic.  Right Ear: External ear normal.  Left Ear: External ear normal.  Nose: Nose normal.  Eyes: Conjunctivae are normal. No scleral icterus.  Neck: Normal range of motion. Neck supple. No thyromegaly present.  No carotid bruits  Cardiovascular: Normal rate, regular rhythm and normal heart sounds.  No murmur heard. Pulmonary/Chest: Effort normal and breath sounds normal.  Musculoskeletal: She exhibits no edema.  Has local tenderness on R lower thoracic region. Does not have vertebral bone pain.   Lymphadenopathy:    She has no cervical adenopathy.  Neurological: She is alert and oriented to person, place, and time.  Skin: Skin is warm and dry. Capillary refill takes less than 2 seconds. She is not diaphoretic.  Psychiatric: Her behavior is normal. Judgment and thought content normal.  Sad affect        Assessment And Plan:     1. Hypertension, essential, benign- stable. I'm wondering when she feels bad because she is getting hypotensive. She will do BP diaries before and after taking medications. Fu in 1 week. Labs ordered as noted.  Had Medicare wellness visit with nurse.  2. Bradycardia- stable, has hx of pulse being between 57-62. No action 3. Depression- slightly improved, I increased her Zoloft 50 mg qd 4- I found the hematology referral in [facility] and the referral coordinator is looking into it.     Juanell Saffo RODRIGUEZ-SOUTHWORTH, PA-C

## 2017-10-30 NOTE — Patient Instructions (Signed)
[name] , Thank you for taking time to come for your Medicare Wellness Visit. I appreciate your ongoing commitment to your health goals. Please review the following plan we discussed and let me know if I can assist you in the future.   Screening recommendations/referrals: Colonoscopy: not required Mammogram: up to date Bone Density: due Recommended yearly ophthalmology/optometry visit for glaucoma screening and checkup Recommended yearly dental visit for hygiene and checkup  Vaccinations: Influenza vaccine: 10/2017 Pneumococcal vaccine: 11/28/2015 Tdap vaccine: due Shingles vaccine: will be getting in a couple of weeks    Advanced directives: Please bring a copy of your POA (Power of Attorney) and/or Living Will to your next appointment.    Conditions/risks identified: Patient has not been taking medications as ordered.   Next appointment: 11/06/2017 at 10:45a   Preventive Care 65 Years and Older, Female Preventive care refers to lifestyle choices and visits with your health care provider that can promote health and wellness. What does preventive care include?  A yearly physical exam. This is also called an annual well check.  Dental exams once or twice a year.  Routine eye exams. Ask your health care provider how often you should have your eyes checked.  Personal lifestyle choices, including:  Daily care of your teeth and gums.  Regular physical activity.  Eating a healthy diet.  Avoiding tobacco and drug use.  Limiting alcohol use.  Practicing safe sex.  Taking low-dose aspirin every day.  Taking vitamin and mineral supplements as recommended by your health care provider. What happens during an annual well check? The services and screenings done by your health care provider during your annual well check will depend on your age, overall health, lifestyle risk factors, and family history of disease. Counseling  Your health care provider may ask you questions  about your:  Alcohol use.  Tobacco use.  Drug use.  Emotional well-being.  Home and relationship well-being.  Sexual activity.  Eating habits.  History of falls.  Memory and ability to understand (cognition).  Work and work Statistician.  Reproductive health. Screening  You may have the following tests or measurements:  Height, weight, and BMI.  Blood pressure.  Lipid and cholesterol levels. These may be checked every 5 years, or more frequently if you are over 65 years old.  Skin check.  Lung cancer screening. You may have this screening every year starting at age 21 if you have a 30-pack-year history of smoking and currently smoke or have quit within the past 15 years.  Fecal occult blood test (FOBT) of the stool. You may have this test every year starting at age 75.  Flexible sigmoidoscopy or colonoscopy. You may have a sigmoidoscopy every 5 years or a colonoscopy every 10 years starting at age 89.  Hepatitis C blood test.  Hepatitis B blood test.  Sexually transmitted disease (STD) testing.  Diabetes screening. This is done by checking your blood sugar (glucose) after you have not eaten for a while (fasting). You may have this done every 1-3 years.  Bone density scan. This is done to screen for osteoporosis. You may have this done starting at age 39.  Mammogram. This may be done every 1-2 years. Talk to your health care provider about how often you should have regular mammograms. Talk with your health care provider about your test results, treatment options, and if necessary, the need for more tests. Vaccines  Your health care provider may recommend certain vaccines, such as:  Influenza vaccine. This is  recommended every year.  Tetanus, diphtheria, and acellular pertussis (Tdap, Td) vaccine. You may need a Td booster every 10 years.  Zoster vaccine. You may need this after age 42.  Pneumococcal 13-valent conjugate (PCV13) vaccine. One dose is  recommended after age 42.  Pneumococcal polysaccharide (PPSV23) vaccine. One dose is recommended after age 57. Talk to your health care provider about which screenings and vaccines you need and how often you need them. This information is not intended to replace advice given to you by your health care provider. Make sure you discuss any questions you have with your health care provider. Document Released: 02/03/2015 Document Revised: 09/27/2015 Document Reviewed: 11/08/2014 Elsevier Interactive Patient Education  2017 [place] Prevention in the Home Falls can cause injuries. They can happen to people of all ages. There are many things you can do to make your home safe and to help prevent falls. What can I do on the outside of my home?  Regularly fix the edges of walkways and driveways and fix any cracks.  Remove anything that might make you trip as you walk through a door, such as a raised step or threshold.  Trim any bushes or trees on the path to your home.  Use bright outdoor lighting.  Clear any walking paths of anything that might make someone trip, such as rocks or tools.  Regularly check to see if handrails are loose or broken. Make sure that both sides of any steps have handrails.  Any raised decks and porches should have guardrails on the edges.  Have any leaves, snow, or ice cleared regularly.  Use sand or salt on walking paths during winter.  Clean up any spills in your garage right away. This includes oil or grease spills. What can I do in the bathroom?  Use night lights.  Install grab bars by the toilet and in the tub and shower. Do not use towel bars as grab bars.  Use non-skid mats or decals in the tub or shower.  If you need to sit down in the shower, use a plastic, non-slip stool.  Keep the floor dry. Clean up any water that spills on the floor as soon as it happens.  Remove soap buildup in the tub or shower regularly.  Attach bath mats  securely with double-sided non-slip rug tape.  Do not have throw rugs and other things on the floor that can make you trip. What can I do in the bedroom?  Use night lights.  Make sure that you have a light by your bed that is easy to reach.  Do not use any sheets or blankets that are too big for your bed. They should not hang down onto the floor.  Have a firm chair that has side arms. You can use this for support while you get dressed.  Do not have throw rugs and other things on the floor that can make you trip. What can I do in the kitchen?  Clean up any spills right away.  Avoid walking on wet floors.  Keep items that you use a lot in easy-to-reach places.  If you need to reach something above you, use a strong step stool that has a grab bar.  Keep electrical cords out of the way.  Do not use floor polish or wax that makes floors slippery. If you must use wax, use non-skid floor wax.  Do not have throw rugs and other things on the floor that can make you trip.  What can I do with my stairs?  Do not leave any items on the stairs.  Make sure that there are handrails on both sides of the stairs and use them. Fix handrails that are broken or loose. Make sure that handrails are as long as the stairways.  Check any carpeting to make sure that it is firmly attached to the stairs. Fix any carpet that is loose or worn.  Avoid having throw rugs at the top or bottom of the stairs. If you do have throw rugs, attach them to the floor with carpet tape.  Make sure that you have a light switch at the top of the stairs and the bottom of the stairs. If you do not have them, ask someone to add them for you. What else can I do to help prevent falls?  Wear shoes that:  Do not have high heels.  Have rubber bottoms.  Are comfortable and fit you well.  Are closed at the toe. Do not wear sandals.  If you use a stepladder:  Make sure that it is fully opened. Do not climb a closed  stepladder.  Make sure that both sides of the stepladder are locked into place.  Ask someone to hold it for you, if possible.  Clearly mark and make sure that you can see:  Any grab bars or handrails.  First and last steps.  Where the edge of each step is.  Use tools that help you move around (mobility aids) if they are needed. These include:  Canes.  Walkers.  Scooters.  Crutches.  Turn on the lights when you go into a dark area. Replace any light bulbs as soon as they burn out.  Set up your furniture so you have a clear path. Avoid moving your furniture around.  If any of your floors are uneven, fix them.  If there are any pets around you, be aware of where they are.  Review your medicines with your doctor. Some medicines can make you feel dizzy. This can increase your chance of falling. Ask your doctor what other things that you can do to help prevent falls. This information is not intended to replace advice given to you by your health care provider. Make sure you discuss any questions you have with your health care provider. Document Released: 11/03/2008 Document Revised: 06/15/2015 Document Reviewed: 02/11/2014 Elsevier Interactive Patient Education  2017 [employer].

## 2017-10-30 NOTE — Progress Notes (Signed)
Subjective:   Whitney Henderson is a 84 y.o. female who presents for Medicare Annual (Subsequent) preventive examination.  Review of Systems:  N/A Cardiac Risk Factors include: advanced age (>14men, >66 women);hypertension     Objective:     Vitals: BP 116/64   Pulse (!) 56   Temp 97.6 F (36.4 C)   Ht 4' 11.5" (1.511 m)   Wt 133 lb 9.6 oz (60.6 kg)   BMI 26.53 kg/m   Body mass index is 26.53 kg/m.  Advanced Directives 10/30/2017 11/14/2016 04/27/2015  Does Patient Have a Medical Advance Directive? Yes No No  Type of Paramedic of [place];Living will - -  Does patient want to make changes to medical advance directive? No - Patient declined - -  Copy of [place] in Chart? No - copy requested - -  Would patient like information on creating a medical advance directive? - No - Patient declined Yes - Scientist, clinical (histocompatibility and immunogenetics) given    Tobacco Social History   Tobacco Use  Smoking Status Former Smoker  Smokeless Tobacco Never Used     Counseling given: Not Answered   Clinical Intake:  Pre-visit preparation completed: Yes  Pain : No/denies pain Pain Score: 0-No pain     Nutritional Status: BMI 25 -29 Overweight Nutritional Risks: Unintentional weight loss Diabetes: No  How often do you need to have someone help you when you read instructions, pamphlets, or other written materials from your doctor or pharmacy?: 1 - Never What is the last grade level you completed in school?: graduated college  Interpreter Needed?: No  Information entered by :: NAllenLPN  Past Medical History:  Diagnosis Date  . Anxiety   . Cataracts, bilateral   . Headache 04/27/2015   Left>right periorbital  . Hypertension   . Memory difficulties 09/04/2016   Past Surgical History:  Procedure Laterality Date  . ABDOMINAL HYSTERECTOMY    . BREAST EXCISIONAL BIOPSY Left over 90 ys ago   benign  . CHOLECYSTECTOMY    . EXCISIONAL HEMORRHOIDECTOMY       Family History  Problem Relation Age of Onset  . Cancer Father   . Breast cancer Daughter        39s   Social History   Socioeconomic History  . Marital status: Married    Spouse name: Whitney Henderson   . Number of children: 4  . Years of education: Education officer, environmental  . Highest education level: Not on file  Occupational History  . Not on file  Social Needs  . Financial resource strain: Not hard at all  . Food insecurity:    Worry: Never true    Inability: Never true  . Transportation needs:    Medical: No    Non-medical: No  Tobacco Use  . Smoking status: Former Research scientist (life sciences)  . Smokeless tobacco: Never Used  Substance and Sexual Activity  . Alcohol use: No  . Drug use: No  . Sexual activity: Not Currently  Lifestyle  . Physical activity:    Days per week: 3 days    Minutes per session: Patient refused  . Stress: Only a little  Relationships  . Social connections:    Talks on phone: Not on file    Gets together: Not on file    Attends religious service: Not on file    Active member of club or organization: Not on file    Attends meetings of clubs or organizations: Not on file    Relationship  status: Not on file  Other Topics Concern  . Not on file  Social History Narrative   Live with husband,  4 children.    Caffeine: 1 cup daily     Outpatient Encounter Medications as of 10/30/2017  Medication Sig  . Alpha-D-Galactosidase (BEANO MELTAWAYS) TABS Take by mouth.  [name] amLODipine (NORVASC) 5 MG tablet Take 1 tablet (5 mg total) by mouth daily.  . metoprolol succinate (TOPROL-XL) 50 MG 24 hr tablet Take 50 mg by mouth daily.  . ASPIRIN 81 PO Take 81 mg by mouth as needed (for pain or headache).   [name] augmented betamethasone dipropionate (DIPROLENE-AF) 0.05 % cream Apply 1 application topically as needed (for itching).   . betamethasone dipropionate (DIPROLENE) 0.05 % cream Apply topically 2 (two) times daily.  [name] Bioflavonoid Products (VITAMIN C) CHEW Chew 1 tablet by mouth every  30 (thirty) days.  . cholecalciferol (VITAMIN D) 1000 units tablet Take 1,000 Units by mouth every 30 (thirty) days.   . Multiple Vitamins-Minerals (CENTRUM ADULTS PO) Take 1 tablet by mouth every other day.   [name] Propylene Glycol (SYSTANE BALANCE OP) Place 1 drop into both eyes daily as needed (for itchy eyes). Uses when needed.   . [DISCONTINUED] ALPRAZolam (XANAX) 0.25 MG tablet Take 0.25 mg by mouth as needed for anxiety.   . [DISCONTINUED] DULoxetine (CYMBALTA) 30 MG capsule One tablet daily for 2 weeks, then take one tablet twice a day (Patient not taking: Reported on 11/14/2016)  . [DISCONTINUED] ibandronate (BONIVA) 150 MG tablet Take 150 mg by mouth every 30 days  . [DISCONTINUED] ibuprofen (ADVIL,MOTRIN) 200 MG tablet Take 200 mg by mouth every 6 (six) hours as needed for mild pain.  . [DISCONTINUED] Polyethyl Glycol-Propyl Glycol (SYSTANE) 0.4-0.3 % SOLN Apply to eye.  . [DISCONTINUED] sertraline (ZOLOFT) 25 MG tablet Take 25 mg by mouth daily.   No facility-administered encounter medications on file as of 10/30/2017.     Activities of Daily Living In your present state of health, do you have any difficulty performing the following activities: 10/30/2017  Hearing? Y  Comment hard to hear sometimes  Vision? Y  Comment Reading glasses depending on what it is  Difficulty concentrating or making decisions? N  Walking or climbing stairs? N  Dressing or bathing? N  Doing errands, shopping? N  Preparing Food and eating ? N  Using the Toilet? N  In the past six months, have you accidently leaked urine? N  Do you have problems with loss of bowel control? N  Managing your Medications? N  Managing your Finances? N  Housekeeping or managing your Housekeeping? N  Some recent data might be hidden    Patient Care Team: [name], MD as PCP - General (Internal Medicine) Monna Fam, MD as Consulting Physician (Ophthalmology)    Assessment:   This is a routine wellness  examination for Whitney Henderson.  Exercise Activities and Dietary recommendations Current Exercise Habits: Structured exercise class, Type of exercise: calisthenics;walking, Time (Minutes): 20, Frequency (Times/Week): 3, Weekly Exercise (Minutes/Week): 60, Intensity: Moderate, Exercise limited by: None identified  Goals   None     Fall Risk Fall Risk  10/30/2017  Falls in the past year? No  Risk for fall due to : Medication side effect   Is the patient's home free of loose throw rugs in walkways, pet beds, electrical cords, etc?   yes      Grab bars in the bathroom? yes      Handrails on the stairs?  N/A      Adequate lighting?   yes  Timed Get Up and Go performed: N/A  Depression Screen PHQ 2/9 Scores 10/30/2017  PHQ - 2 Score 2  PHQ- 9 Score 4     Cognitive Function MMSE - Mini Mental State Exam 09/04/2016  Orientation to time 4  Orientation to Place 5  Registration 3  Attention/ Calculation 5  Recall 0  Language- name 2 objects 2  Language- repeat 1  Language- follow 3 step command 3  Language- read & follow direction 1  Write a sentence 1  Copy design 1  Total score 26     6CIT Screen 10/30/2017  What Year? 0 points  What month? 0 points  What time? 0 points  Count back from 20 0 points  Months in reverse 2 points  Repeat phrase 2 points  Total Score 4    Immunization History  Administered Date(s) Administered  . Influenza-Unspecified 10/22/2017  . Pneumococcal Conjugate-13 11/28/2015    Qualifies for Shingles Vaccine?will be getting in a couple of weeks  Screening Tests Health Maintenance  Topic Date Due  . TETANUS/TDAP  03/13/1949  . DEXA SCAN  03/14/1995  . PNA vac Low Risk Adult (1 of 2 - PCV13) 03/14/1995  . INFLUENZA VACCINE  08/21/2017    Cancer Screenings: Lung: Low Dose CT Chest recommended if Age 63-80 years, 30 pack-year currently smoking OR have quit w/in 15years. Patient does not qualify. Breast:  Up to date on Mammogram? Yes   Up to  date of Bone Density/Dexa? No Colorectal: not required  Additional Screenings: : Hepatitis C Screening: N/A     Plan:    Patient did not want to make any goals today. She states that she has only been taking her blood pressure medications and none of the others that are ordered.   I have personally reviewed and noted the following in the patient's chart:   . Medical and social history . Use of alcohol, tobacco or illicit drugs  . Current medications and supplements . Functional ability and status . Nutritional status . Physical activity . Advanced directives . List of other physicians . Hospitalizations, surgeries, and ER visits in previous 12 months . Vitals . Screenings to include cognitive, depression, and falls . Referrals and appointments  In addition, I have reviewed and discussed with patient certain preventive protocols, quality metrics, and best practice recommendations. A written personalized care plan for preventive services as well as general preventive health recommendations were provided to patient.     Kellie Simmering, LPN  [id number]

## 2017-10-31 LAB — CMP14 + ANION GAP
ALBUMIN: 4.3 g/dL (ref 3.5–4.7)
ALK PHOS: 65 IU/L (ref 39–117)
ALT: 12 IU/L (ref 0–32)
ANION GAP: 20 mmol/L — AB (ref 10.0–18.0)
AST: 19 IU/L (ref 0–40)
Albumin/Globulin Ratio: 1.3 (ref 1.2–2.2)
BUN/Creatinine Ratio: 20 (ref 12–28)
BUN: 25 mg/dL (ref 8–27)
Bilirubin Total: 0.5 mg/dL (ref 0.0–1.2)
CO2: 19 mmol/L — AB (ref 20–29)
CREATININE: 1.27 mg/dL — AB (ref 0.57–1.00)
Calcium: 9.7 mg/dL (ref 8.7–10.3)
Chloride: 106 mmol/L (ref 96–106)
GFR calc Af Amer: 44 mL/min/{1.73_m2} — ABNORMAL LOW (ref >59)
GFR calc non Af Amer: 38 mL/min/{1.73_m2} — ABNORMAL LOW (ref >59)
GLUCOSE: 93 mg/dL (ref 65–99)
Globulin, Total: 3.3 g/dL (ref 1.5–4.5)
Potassium: 4.9 mmol/L (ref 3.5–5.2)
Sodium: 145 mmol/L — ABNORMAL HIGH (ref 134–144)
Total Protein: 7.6 g/dL (ref 6.0–8.5)

## 2017-10-31 LAB — LIPID PANEL
CHOL/HDL RATIO: 2.9 ratio (ref 0.0–4.4)
CHOLESTEROL TOTAL: 244 mg/dL — AB (ref 100–199)
HDL: 83 mg/dL (ref >39)
LDL CALC: 148 mg/dL — AB (ref 0–99)
TRIGLYCERIDES: 67 mg/dL (ref 0–149)
VLDL Cholesterol Cal: 13 mg/dL (ref 5–40)

## 2017-10-31 LAB — T3, FREE: T3, Free: 2.5 pg/mL (ref 2.0–4.4)

## 2017-10-31 LAB — CBC
HEMATOCRIT: 37.2 % (ref 34.0–46.6)
Hemoglobin: 12.1 g/dL (ref 11.1–15.9)
MCH: 26.7 pg (ref 26.6–33.0)
MCHC: 32.5 g/dL (ref 31.5–35.7)
MCV: 82 fL (ref 79–97)
Platelets: 228 10*3/uL (ref 150–450)
RBC: 4.54 x10E6/uL (ref 3.77–5.28)
RDW: 14.1 % (ref 12.3–15.4)
WBC: 4.4 10*3/uL (ref 3.4–10.8)

## 2017-10-31 LAB — TSH: TSH: 2.6 u[IU]/mL (ref 0.450–4.500)

## 2017-10-31 LAB — T4, FREE: FREE T4: 1.35 ng/dL (ref 0.82–1.77)

## 2017-11-06 ENCOUNTER — Ambulatory Visit: Payer: Medicare Other | Admitting: Internal Medicine

## 2017-11-06 ENCOUNTER — Telehealth: Payer: Self-pay | Admitting: Internal Medicine

## 2017-11-06 ENCOUNTER — Encounter: Payer: Self-pay | Admitting: Internal Medicine

## 2017-11-06 ENCOUNTER — Other Ambulatory Visit: Payer: Self-pay | Admitting: Internal Medicine

## 2017-11-06 VITALS — BP 140/70 | HR 60 | Temp 98.4°F | Ht 59.5 in | Wt 134.6 lb

## 2017-11-06 DIAGNOSIS — I10 Essential (primary) hypertension: Secondary | ICD-10-CM

## 2017-11-06 MED ORDER — AMLODIPINE BESYLATE 2.5 MG PO TABS: 2.5000 mg | ORAL_TABLET | Freq: Every day | ORAL | 0 refills | Status: DC

## 2017-11-06 NOTE — Telephone Encounter (Signed)
Made patient aware.

## 2017-11-06 NOTE — Progress Notes (Signed)
Subjective:     Patient ID: Whitney Henderson , female    DOB: 01/03/1929 , 84 y.o.   MRN: [id number]   HPI  Here for BP diary FU and brought 2 days of BP diaries only , states her battery died and did not replace it. She only has one BP diary before taking the Amlodipine which shows to be on 10/13  111/47 and she felt a little jittery and nervous. Pulse was 75. After the medication around 4:20 pm BP= 121/84, P= 70.             5:30 pm BP = 118/48, P= 69  10/21 BP diary  2:15 pm BP= 142/77, P= 65 5 pm  BP= 135/71, P= 66  Past Medical History:  Diagnosis Date  . Anxiety   . Cataracts, bilateral   . Headache 04/27/2015   Left>right periorbital  . Hypertension   . Memory difficulties 09/04/2016      Current Outpatient Medications:  .  Alpha-D-Galactosidase (BEANO MELTAWAYS) TABS, Take by mouth., Disp: , Rfl:  .  amLODipine (NORVASC) 5 MG tablet, Take 1 tablet (5 mg total) by mouth daily., Disp: 30 tablet, Rfl: 0 .  ASPIRIN 81 PO, Take 81 mg by mouth as needed (for pain or headache). , Disp: , Rfl:  .  augmented betamethasone dipropionate (DIPROLENE-AF) 0.05 % cream, Apply 1 application topically as needed (for itching). , Disp: , Rfl:  .  betamethasone dipropionate (DIPROLENE) 0.05 % cream, Apply topically 2 (two) times daily., Disp: , Rfl:  .  Bioflavonoid Products (VITAMIN C) CHEW, Chew 1 tablet by mouth every 30 (thirty) days., Disp: , Rfl:  .  cholecalciferol (VITAMIN D) 1000 units tablet, Take 1,000 Units by mouth every 30 (thirty) days. , Disp: , Rfl:  .  metoprolol succinate (TOPROL-XL) 50 MG 24 hr tablet, Take 50 mg by mouth daily., Disp: , Rfl: 0 .  Multiple Vitamins-Minerals (CENTRUM ADULTS PO), Take 1 tablet by mouth every other day. , Disp: , Rfl:  .  Propylene Glycol (SYSTANE BALANCE OP), Place 1 drop into both eyes daily as needed (for itchy eyes). Uses when needed. , Disp: , Rfl:    No Known Allergies   Review of Systems  Constitutional: Negative for appetite change,  chills, diaphoresis and fever.  Respiratory: Negative for chest tightness and shortness of breath.   Cardiovascular: Negative for chest pain, palpitations and leg swelling.  Endocrine:       Feels cold when she gets out of bed, but her room is not colder than when she went to bed.  Musculoskeletal: Negative for gait problem.  Skin: Negative for rash.  Neurological: Negative for dizziness, facial asymmetry, weakness and headaches.     Today's Vitals   11/06/17 1049  BP: 140/70  Pulse: 60  Temp: 98.4 F (36.9 C)  TempSrc: Oral  SpO2: 97%  Weight: 134 lb 9.6 oz (61.1 kg)  Height: 4' 11.5" (1.511 m)   Body mass index is 26.73 kg/m.   Objective:  Physical Exam   Constitutional: She is oriented to person, place, and time. She appears well-developed and well-nourished. No distress.  HENT:  Head: Normocephalic and atraumatic.  Right Ear: External ear normal.  Left Ear: External ear normal.  Nose: Nose normal.  Eyes: Conjunctivae are normal. Right eye exhibits no discharge. Left eye exhibits no discharge. No scleral icterus.  Neck: Neck supple. No thyromegaly present.  No carotid bruits bilaterally  Cardiovascular: Normal rate and regular rhythm.  No  murmur heard. Pulmonary/Chest: Effort normal and breath sounds normal. No respiratory distress.  Musculoskeletal: Normal range of motion. She exhibits no edema.  Lymphadenopathy:    She has no cervical adenopathy.  Neurological: She is alert and oriented to person, place, and time.  Skin: Skin is warm and dry. Capillary refill takes less than 2 seconds. No rash noted. She is not diaphoretic.  Psychiatric: She has a normal mood and affect. Her behavior is normal. Judgment and thought content normal.  Nursing note reviewed.    Assessment And Plan:     1. Essential hypertension- with some hypotension symptoms. Per Dr Baird Cancer she advised pt decreases the Norvasc to 2.5 mg and I sent a new rx. Needs to continue BP diaries.    Irving Bloor  RODRIGUEZ-SOUTHWORTH, PA-C

## 2017-11-07 ENCOUNTER — Telehealth: Payer: Self-pay

## 2017-11-07 NOTE — Telephone Encounter (Signed)
Patient notified to stop the Advil and appt for follow-up in 4 weeks was made.

## 2017-11-07 NOTE — Telephone Encounter (Signed)
Left the pt a message to call the office back.    The pt needs to know that Dr. Baird Cancer got a response from Dr. Moshe Cipro at [facility] and that she suggest that the pt stop taking Advil.  Dr. Baird Cancer wants the pt to come to the office and have a f/u with her in 4 weeks to recheck her kidneys.

## 2017-12-11 ENCOUNTER — Encounter: Payer: Self-pay | Admitting: Internal Medicine

## 2017-12-11 ENCOUNTER — Other Ambulatory Visit: Payer: Self-pay

## 2017-12-11 ENCOUNTER — Ambulatory Visit (INDEPENDENT_AMBULATORY_CARE_PROVIDER_SITE_OTHER): Payer: Medicare Other | Admitting: Internal Medicine

## 2017-12-11 VITALS — BP 142/86 | HR 66 | Temp 97.5°F | Ht 59.5 in | Wt 135.6 lb

## 2017-12-11 DIAGNOSIS — R5383 Other fatigue: Secondary | ICD-10-CM

## 2017-12-11 DIAGNOSIS — I129 Hypertensive chronic kidney disease with stage 1 through stage 4 chronic kidney disease, or unspecified chronic kidney disease: Secondary | ICD-10-CM

## 2017-12-11 DIAGNOSIS — R413 Other amnesia: Secondary | ICD-10-CM | POA: Diagnosis not present

## 2017-12-11 DIAGNOSIS — N183 Chronic kidney disease, stage 3 unspecified: Secondary | ICD-10-CM | POA: Insufficient documentation

## 2017-12-11 MED ORDER — SERTRALINE HCL 25 MG PO TABS: 25.0000 mg | ORAL_TABLET | Freq: Every day | ORAL | 1 refills | Status: DC

## 2017-12-11 MED ORDER — SERTRALINE HCL 25 MG PO TABS: 25.0000 mg | ORAL_TABLET | Freq: Every day | ORAL | 2 refills | Status: DC

## 2017-12-11 NOTE — Patient Instructions (Addendum)
Transient Global Amnesia Transient global amnesia causes a sudden and temporary (transient) loss of memory (amnesia). While you may recall memories from your distant past, including being able to recognize people you know well, you may not recall things that happened more recently in the past days, months, or even year. A transient global amnesia episode does not last longer than 24 hours. Transient global amnesia does not affect your other brain functions. Your memory usually returns to normal after an episode is over. One episode of transient global amnesia does not make you more likely to have a stroke, a relapse, or other complications. What are the causes? The cause of this condition is not known. What increases the risk? Transient global amnesia is more likely to develop in people who:  Are 30-28 years old.  Have a history of migraine headaches.  What are the signs or symptoms? The main symptoms of this condition include:  The inability to remember recent events.  Asking repetitive questions about the situation and surroundings and not recalling the answers to these questions.  Other symptoms include:  Restlessness and nervousness.  Confusion.  Headaches.  Dizziness.  Nausea.  How is this diagnosed? Your health care provider may suspect transient global amnesia based on your symptoms. Your health care provider will do a physical exam. This may include a test to check your mental abilities (cognitive evaluation). You may also have imaging studies done to check your brain function. These may include:  Electroencephalography (EEG).  Diffusion-weighted imaging (DWI).  MRI.  How is this treated? There is no treatment for this condition. An episode typically goes away on its own after a few hours. If you also have a seizure or migraine during an episode, you will receive treatment for these conditions. This may include medicines. Follow these instructions at home:  Take  medicines only as directed by your health care provider.  Tell your family or friends that you have transient global amnesia. Ask them to help you avoid physical exertion, including sexual intercourse, swimming, and straining while holding your breath (Valsalva maneuver), until the episode passes. These are events that can bring on transient global amnesia attacks. Contact a health care provider if:  You have a migraine and it does not go away after you have followed your treatment plan for this condition.  You have a seizure for the first time, or a seizure that is different from seizures you normally have.  You experience transient global amnesia repeatedly. This information is not intended to replace advice given to you by your health care provider. Make sure you discuss any questions you have with your health care provider. Document Released: 02/15/2004 Document Revised: 09/11/2015 Document Reviewed: 09/22/2013 Elsevier Interactive Patient Education  2018 [employer].    Fatigue Fatigue is feeling tired all of the time, a lack of energy, or a lack of motivation. Occasional or mild fatigue is often a normal response to activity or life in general. However, long-lasting (chronic) or extreme fatigue may indicate an underlying medical condition. Follow these instructions at home: Watch your fatigue for any changes. The following actions may help to lessen any discomfort you are feeling:  Talk to your health care provider about how much sleep you need each night. Try to get the required amount every night.  Take medicines only as directed by your health care provider.  Eat a healthy and nutritious diet. Ask your health care provider if you need help changing your diet.  Drink enough fluid to  keep your urine clear or pale yellow.  Practice ways of relaxing, such as yoga, meditation, massage therapy, or acupuncture.  Exercise regularly.  Change situations that cause you stress. Try to  keep your work and personal routine reasonable.  Do not abuse illegal drugs.  Limit alcohol intake to no more than 1 drink per day for nonpregnant women and 2 drinks per day for men. One drink equals 12 ounces of beer, 5 ounces of wine, or 1 ounces of hard liquor.  Take a multivitamin, if directed by your health care provider.  Contact a health care provider if:  Your fatigue does not get better.  You have a fever.  You have unintentional weight loss or gain.  You have headaches.  You have difficulty: ? Falling asleep. ? Sleeping throughout the night.  You feel angry, guilty, anxious, or sad.  You are unable to have a bowel movement (constipation).  You skin is dry.  Your legs or another part of your body is swollen. Get help right away if:  You feel confused.  Your vision is blurry.  You feel faint or pass out.  You have a severe headache.  You have severe abdominal, pelvic, or back pain.  You have chest pain, shortness of breath, or an irregular or fast heartbeat.  You are unable to urinate or you urinate less than normal.  You develop abnormal bleeding, such as bleeding from the rectum, vagina, nose, lungs, or nipples.  You vomit blood.  You have thoughts about harming yourself or committing suicide.  You are worried that you might harm someone else. This information is not intended to replace advice given to you by your health care provider. Make sure you discuss any questions you have with your health care provider. Document Released: 11/04/2006 Document Revised: 06/15/2015 Document Reviewed: 05/11/2013 Elsevier Interactive Patient Education  [employer].

## 2017-12-13 ENCOUNTER — Encounter: Payer: Self-pay | Admitting: Internal Medicine

## 2017-12-14 NOTE — Progress Notes (Signed)
Subjective:     Patient ID: Whitney Henderson , female    DOB: 06/18/1928 , 84 y.o.   MRN: [id number]   Chief Complaint  Patient presents with  . Hypertension    recheck kidney fxn    HPI  Hypertension  This is a chronic problem. The current episode started more than 1 year ago. The problem has been gradually improving since onset. The problem is controlled. Pertinent negatives include no blurred vision, chest pain, headaches, neck pain, orthopnea, palpitations or shortness of breath.     Past Medical History:  Diagnosis Date  . Anxiety   . Cataracts, bilateral   . Headache 04/27/2015   Left>right periorbital  . Hypertension   . Memory difficulties 09/04/2016     Family History  Problem Relation Age of Onset  . Cancer Father   . Breast cancer Daughter        45s     Current Outpatient Medications:  .  Alpha-D-Galactosidase (BEANO MELTAWAYS) TABS, Take by mouth., Disp: , Rfl:  .  amLODipine (NORVASC) 2.5 MG tablet, Take 1 tablet (2.5 mg total) by mouth daily., Disp: 90 tablet, Rfl: 0 .  ASPIRIN 81 PO, Take 81 mg by mouth as needed (for pain or headache). , Disp: , Rfl:  .  betamethasone dipropionate (DIPROLENE) 0.05 % cream, Apply topically 2 (two) times daily., Disp: , Rfl:  .  Bioflavonoid Products (VITAMIN C) CHEW, Chew 1 tablet by mouth every 30 (thirty) days., Disp: , Rfl:  .  cholecalciferol (VITAMIN D) 1000 units tablet, Take 1,000 Units by mouth every 30 (thirty) days. , Disp: , Rfl:  .  famotidine (PEPCID) 20 MG tablet, Take 20 mg by mouth daily., Disp: , Rfl:  .  Propylene Glycol (SYSTANE BALANCE OP), Place 1 drop into both eyes daily as needed (for itchy eyes). Uses when needed. , Disp: , Rfl:  .  sertraline (ZOLOFT) 25 MG tablet, Take 1 tablet (25 mg total) by mouth daily., Disp: 90 tablet, Rfl: 1   Allergies  Allergen Reactions  . Codeine Rash     Review of Systems  Constitutional: Negative.   Eyes: Negative for blurred vision.  Respiratory: Negative.   Negative for shortness of breath.   Cardiovascular: Negative.  Negative for chest pain, palpitations and orthopnea.  Gastrointestinal: Negative.   Musculoskeletal: Negative for neck pain.  Neurological: Negative.  Negative for headaches.  Psychiatric/Behavioral: Negative.      Today's Vitals   12/11/17 1013  BP: (!) 142/86  Pulse: 66  Temp: (!) 97.5 F (36.4 C)  TempSrc: Oral  Weight: 135 lb 9.6 oz (61.5 kg)  Height: 4' 11.5" (1.511 m)  PainSc: 0-No pain   Body mass index is 26.93 kg/m.   Objective:  Physical Exam  Constitutional: She is oriented to person, place, and time. She appears well-developed and well-nourished.  HENT:  Head: Normocephalic and atraumatic.  Cardiovascular: Normal rate, regular rhythm and normal heart sounds.  Pulmonary/Chest: Effort normal and breath sounds normal.  Neurological: She is alert and oriented to person, place, and time.  Psychiatric: She has a normal mood and affect.  Nursing note and vitals reviewed.       Assessment And Plan:     1. Hypertensive nephropathy  FAIR CONTROL. SHE WILL CONTINUE WITH CURRENT MEDS. SHE IS ENCOURAGED TO AVOID ADDING SALT TO HER FOODS. I DID PERFORM CHART REVIEW DURING HER VISIT AS WELL TO REVIEW BP READINGS W/ OTHER PROVIDERS.   2. Chronic renal disease, stage  III (HCC)  CHRONIC. THIS HAS BEEN STABLE.   3. Fatigue, unspecified type  SHE IS ENCOURAGED TO STAY WELL HYDRATED.  SHE IS ALSO ENCOURAGED TO TRY TAKING MVI ONCE DAILY.    4. Memory deficit  SHE REQUESTED MRI; HOWEVER, SHE HAD ONE TWO YEARS AGO. SHE IS ALSO FOLLOWED BY NEUROLOGY. SHE DOES NOT WISH TO START ANY MEDICATION.   Maximino Greenland, MD

## 2017-12-15 DIAGNOSIS — H35363 Drusen (degenerative) of macula, bilateral: Secondary | ICD-10-CM | POA: Diagnosis not present

## 2017-12-15 DIAGNOSIS — H26493 Other secondary cataract, bilateral: Secondary | ICD-10-CM | POA: Diagnosis not present

## 2017-12-15 DIAGNOSIS — H40013 Open angle with borderline findings, low risk, bilateral: Secondary | ICD-10-CM | POA: Diagnosis not present

## 2017-12-15 DIAGNOSIS — H35033 Hypertensive retinopathy, bilateral: Secondary | ICD-10-CM | POA: Diagnosis not present

## 2017-12-15 LAB — HM DIABETES EYE EXAM

## 2018-01-02 DIAGNOSIS — I129 Hypertensive chronic kidney disease with stage 1 through stage 4 chronic kidney disease, or unspecified chronic kidney disease: Secondary | ICD-10-CM | POA: Diagnosis not present

## 2018-01-02 DIAGNOSIS — M329 Systemic lupus erythematosus, unspecified: Secondary | ICD-10-CM | POA: Diagnosis not present

## 2018-01-02 DIAGNOSIS — N183 Chronic kidney disease, stage 3 (moderate): Secondary | ICD-10-CM | POA: Diagnosis not present

## 2018-01-06 ENCOUNTER — Other Ambulatory Visit: Payer: Self-pay | Admitting: Internal Medicine

## 2018-01-06 DIAGNOSIS — N183 Chronic kidney disease, stage 3 unspecified: Secondary | ICD-10-CM

## 2018-01-09 ENCOUNTER — Other Ambulatory Visit: Payer: Medicare Other

## 2018-01-13 ENCOUNTER — Ambulatory Visit
Admission: RE | Admit: 2018-01-13 | Discharge: 2018-01-13 | Disposition: A | Discharge status: Home / Self Care | Payer: Medicare Other | Source: Ambulatory Visit | Attending: Internal Medicine | Admitting: Internal Medicine

## 2018-01-13 DIAGNOSIS — N183 Chronic kidney disease, stage 3 unspecified: Secondary | ICD-10-CM

## 2018-01-13 DIAGNOSIS — I129 Hypertensive chronic kidney disease with stage 1 through stage 4 chronic kidney disease, or unspecified chronic kidney disease: Secondary | ICD-10-CM | POA: Diagnosis not present

## 2018-01-19 ENCOUNTER — Encounter: Payer: Self-pay | Admitting: Internal Medicine

## 2018-01-31 ENCOUNTER — Other Ambulatory Visit: Payer: Self-pay | Admitting: Internal Medicine

## 2018-02-09 ENCOUNTER — Ambulatory Visit (INDEPENDENT_AMBULATORY_CARE_PROVIDER_SITE_OTHER): Payer: Medicare Other | Admitting: Internal Medicine

## 2018-02-09 ENCOUNTER — Encounter: Payer: Self-pay | Admitting: Internal Medicine

## 2018-02-09 VITALS — BP 116/78 | HR 60 | Temp 97.5°F | Ht 61.0 in | Wt 132.4 lb

## 2018-02-09 DIAGNOSIS — N183 Chronic kidney disease, stage 3 unspecified: Secondary | ICD-10-CM

## 2018-02-09 DIAGNOSIS — I129 Hypertensive chronic kidney disease with stage 1 through stage 4 chronic kidney disease, or unspecified chronic kidney disease: Secondary | ICD-10-CM | POA: Diagnosis not present

## 2018-02-09 DIAGNOSIS — F419 Anxiety disorder, unspecified: Secondary | ICD-10-CM

## 2018-02-09 DIAGNOSIS — E2839 Other primary ovarian failure: Secondary | ICD-10-CM

## 2018-02-09 NOTE — Progress Notes (Addendum)
Subjective:     Patient ID: Whitney Henderson , female    DOB: 1928-09-22 , 85 y.o.   MRN: [id number]   Chief Complaint  Patient presents with  . Hypertension    HPI  Hypertension  This is a chronic problem. The current episode started more than 1 year ago. The problem has been gradually improving since onset. The problem is controlled. Pertinent negatives include no blurred vision, palpitations or shortness of breath. Past treatments include calcium channel blockers.     Past Medical History:  Diagnosis Date  . Anxiety   . Cataracts, bilateral   . Headache 04/27/2015   Left>right periorbital  . Hypertension   . Memory difficulties 09/04/2016     Family History  Problem Relation Age of Onset  . Cancer Father   . Breast cancer Daughter        50s     Current Outpatient Medications:  .  Alpha-D-Galactosidase (BEANO MELTAWAYS) TABS, Take by mouth., Disp: , Rfl:  .  amLODipine (NORVASC) 2.5 MG tablet, TAKE 1 TABLET(2.5 MG) BY MOUTH DAILY, Disp: 90 tablet, Rfl: 0 .  ASPIRIN 81 PO, Take 81 mg by mouth as needed (for pain or headache). , Disp: , Rfl:  .  betamethasone dipropionate (DIPROLENE) 0.05 % cream, Apply topically 2 (two) times daily., Disp: , Rfl:  .  cholecalciferol (VITAMIN D) 1000 units tablet, Take 1,000 Units by mouth every 30 (thirty) days. , Disp: , Rfl:  .  Multiple Vitamins-Minerals (CENTRUM SILVER PO), Take by mouth., Disp: , Rfl:  .  Multiple Vitamins-Minerals (PRESERVISION AREDS 2 PO), Take by mouth., Disp: , Rfl:  .  Propylene Glycol (SYSTANE BALANCE OP), Place 1 drop into both eyes daily as needed (for itchy eyes). Uses when needed. , Disp: , Rfl:  .  vitamin C (ASCORBIC ACID) 500 MG tablet, Take 500 mg by mouth daily., Disp: , Rfl:    Allergies  Allergen Reactions  . Codeine Rash     Review of Systems  Constitutional: Negative.   Eyes: Negative for blurred vision.  Respiratory: Negative.  Negative for shortness of breath.   Cardiovascular: Negative.   Negative for palpitations.  Gastrointestinal: Negative.   Neurological: Negative.   Psychiatric/Behavioral: The patient is nervous/anxious.      Today's Vitals   02/09/18 0942  BP: 116/78  Pulse: 60  Temp: (!) 97.5 F (36.4 C)  TempSrc: Oral  SpO2: 98%  Weight: 132 lb 6.4 oz (60.1 kg)  Height: 5\' 1"  (1.549 m)  PainSc: 0-No pain   Body mass index is 25.02 kg/m.   Objective:  Physical Exam Constitutional:      Appearance: Normal appearance.  HENT:     Head: Normocephalic.  Cardiovascular:     Rate and Rhythm: Normal rate and regular rhythm.     Heart sounds: Normal heart sounds.  Pulmonary:     Effort: Pulmonary effort is normal.     Breath sounds: Normal breath sounds.  Genitourinary:    Rectum: Guaiac result negative.  Skin:    General: Skin is warm.  Neurological:     General: No focal deficit present.     Mental Status: She is alert.  Psychiatric:        Mood and Affect: Mood normal.         Assessment And Plan:     1. Parenchymal renal hypertension, stage 1 through stage 4 or unspecified chronic kidney disease  Well controlled. She will continue with current meds. She is  encouraged to avoid adding salt to her foods.   2. Chronic renal disease, stage III (HCC)  Chronic. She is encouraged to stay well hydrated.   3. Estrogen deficiency  I will refer her for bone density. She is encouraged to walk 10 minutes twice daily three to four days per week. She is also encouraged to take vitamin D and calcium supplements daily.   - DG Bone Density; Future  4. Anxiety  CHRONIC. She stopped the zoloft b/c it made her feel funny.  She will continue with her otc product. She does agree to counseling.   - Ambulatory referral to Psychiatry        Maximino Greenland, MD

## 2018-02-09 NOTE — Patient Instructions (Signed)

## 2018-02-22 ENCOUNTER — Ambulatory Visit (INDEPENDENT_AMBULATORY_CARE_PROVIDER_SITE_OTHER)
Admission: EM | Admit: 2018-02-22 | Discharge: 2018-02-22 | Disposition: A | Discharge status: Home / Self Care | Payer: Medicare Other | Source: Home / Self Care | Attending: Family Medicine | Admitting: Family Medicine

## 2018-02-22 ENCOUNTER — Encounter (HOSPITAL_COMMUNITY): Payer: Self-pay | Admitting: Emergency Medicine

## 2018-02-22 ENCOUNTER — Emergency Department (HOSPITAL_COMMUNITY): Payer: Medicare Other

## 2018-02-22 ENCOUNTER — Encounter (HOSPITAL_COMMUNITY): Payer: Self-pay

## 2018-02-22 ENCOUNTER — Emergency Department (HOSPITAL_COMMUNITY)
Admission: EM | Admit: 2018-02-22 | Discharge: 2018-02-22 | Disposition: A | Discharge status: Home / Self Care | Payer: Medicare Other | Attending: Emergency Medicine | Admitting: Emergency Medicine

## 2018-02-22 ENCOUNTER — Other Ambulatory Visit: Payer: Self-pay

## 2018-02-22 DIAGNOSIS — Z87891 Personal history of nicotine dependence: Secondary | ICD-10-CM | POA: Insufficient documentation

## 2018-02-22 DIAGNOSIS — Z79899 Other long term (current) drug therapy: Secondary | ICD-10-CM | POA: Diagnosis not present

## 2018-02-22 DIAGNOSIS — I10 Essential (primary) hypertension: Secondary | ICD-10-CM | POA: Insufficient documentation

## 2018-02-22 DIAGNOSIS — R079 Chest pain, unspecified: Secondary | ICD-10-CM | POA: Diagnosis not present

## 2018-02-22 LAB — I-STAT TROPONIN, ED: Troponin i, poc: 0 ng/mL (ref 0.00–0.08)

## 2018-02-22 LAB — BASIC METABOLIC PANEL
Anion gap: 13 (ref 5–15)
BUN: 26 mg/dL — ABNORMAL HIGH (ref 8–23)
CALCIUM: 9.6 mg/dL (ref 8.9–10.3)
CO2: 20 mmol/L — ABNORMAL LOW (ref 22–32)
CREATININE: 1.25 mg/dL — AB (ref 0.44–1.00)
Chloride: 106 mmol/L (ref 98–111)
GFR calc Af Amer: 45 mL/min — ABNORMAL LOW (ref >60)
GFR calc non Af Amer: 39 mL/min — ABNORMAL LOW (ref >60)
Glucose, Bld: 74 mg/dL (ref 70–99)
Potassium: 4.1 mmol/L (ref 3.5–5.1)
SODIUM: 139 mmol/L (ref 135–145)

## 2018-02-22 LAB — CBC
HCT: 40.3 % (ref 36.0–46.0)
Hemoglobin: 13.4 g/dL (ref 12.0–15.0)
MCH: 27.9 pg (ref 26.0–34.0)
MCHC: 33.3 g/dL (ref 30.0–36.0)
MCV: 83.8 fL (ref 80.0–100.0)
PLATELETS: 145 10*3/uL — AB (ref 150–400)
RBC: 4.81 MIL/uL (ref 3.87–5.11)
RDW: 15.1 % (ref 11.5–15.5)
WBC: 4.5 10*3/uL (ref 4.0–10.5)
nRBC: 0 % (ref 0.0–0.2)

## 2018-02-22 MED ORDER — AMLODIPINE BESYLATE 5 MG PO TABS
2.5000 mg | ORAL_TABLET | Freq: Once | ORAL | Status: AC
  Administered 2018-02-22: 2.5 mg via ORAL
  Filled 2018-02-22: qty 1

## 2018-02-22 NOTE — ED Notes (Signed)
Patient transported to xr

## 2018-02-22 NOTE — ED Provider Notes (Signed)
[facility] Provider Note   CSN: [id number] Arrival date & time: 02/22/18  1242     History   Chief Complaint Chief Complaint  Patient presents with  . Hypertension    HPI Whitney Henderson is a 85 y.o. female.  Patient with history of stage III chronic kidney disease, hypertension on amlodipine 2.5 mg in the morning --presents to the emergency department after going to urgent care for hypertension.  Patient's blood pressure has been running normal up until today when family noted it to be in the 924 systolic range.  Patient was feeling "woozy" and having some blurry vision.  She also has had some intermittent mild mid chest pain.  No shortness of breath.  She denies vision loss or headache.  No vomiting, diaphoresis, lightheadedness or syncope.  No abdominal pain or urinary symptoms.  Patient denies signs of stroke including: facial droop, slurred speech, aphasia, weakness/numbness in extremities, imbalance/trouble walking. Patient states that she is sad and anxious because her husband died approximately 1 year ago.  Daughter endorses increased stress recently.  Patient has been compliant with her medications.  She is followed closely by her primary care doctor. The onset of this condition was acute. The course is constant. Aggravating factors: none. Alleviating factors: none.       Past Medical History:  Diagnosis Date  . Anxiety   . Cataracts, bilateral   . Headache 04/27/2015   Left>right periorbital  . Hypertension   . Memory difficulties 09/04/2016    Patient Active Problem List   Diagnosis Date Noted  . Chronic renal disease, stage III ([place]) 12/11/2017  . Hypertension, essential, benign 10/30/2017  . Anxiety 10/11/2017  . Memory difficulties 09/04/2016  . Headache 04/27/2015  . Hypertension 01/16/2013  . UTI (lower urinary tract infection) 01/16/2013    Past Surgical History:  Procedure Laterality Date  . ABDOMINAL HYSTERECTOMY     . BREAST EXCISIONAL BIOPSY Left over 47 ys ago   benign  . CHOLECYSTECTOMY    . EXCISIONAL HEMORRHOIDECTOMY       OB History   No obstetric history on file.      Home Medications    Prior to Admission medications   Medication Sig Start Date End Date Taking? Authorizing Provider  Alpha-D-Galactosidase (BEANO MELTAWAYS) TABS Take by mouth.    [provider]  amLODipine (NORVASC) 2.5 MG tablet TAKE 1 TABLET(2.5 MG) BY MOUTH DAILY 02/02/18   [name], MD  ASPIRIN 81 PO Take 81 mg by mouth as needed (for pain or headache).     [provider]  betamethasone dipropionate (DIPROLENE) 0.05 % cream Apply topically 2 (two) times daily.    [provider]  cholecalciferol (VITAMIN D) 1000 units tablet Take 1,000 Units by mouth every 30 (thirty) days.     [provider]  Multiple Vitamins-Minerals (CENTRUM SILVER PO) Take by mouth.    [provider]  Multiple Vitamins-Minerals (PRESERVISION AREDS 2 PO) Take by mouth.    [provider]  Propylene Glycol (SYSTANE BALANCE OP) Place 1 drop into both eyes daily as needed (for itchy eyes). Uses when needed.     [provider]  vitamin C (ASCORBIC ACID) 500 MG tablet Take 500 mg by mouth daily.    [provider]    Family History Family History  Problem Relation Age of Onset  . Cancer Father   . Breast cancer Daughter        49s  Social History Social History   Tobacco Use  . Smoking status: Former Smoker    Years: 2.00  . Smokeless tobacco: Never Used  Substance Use Topics  . Alcohol use: No  . Drug use: No     Allergies   Codeine   Review of Systems Review of Systems  Constitutional: Positive for fatigue. Negative for diaphoresis and fever.  Eyes: Positive for visual disturbance. Negative for redness.  Respiratory: Negative for cough and shortness of breath.   Cardiovascular: Positive for chest pain. Negative for palpitations and leg  swelling.  Gastrointestinal: Negative for abdominal pain, nausea and vomiting.  Genitourinary: Negative for dysuria.  Musculoskeletal: Negative for back pain and neck pain.  Skin: Negative for rash.  Neurological: Negative for syncope and light-headedness.  Psychiatric/Behavioral: The patient is not nervous/anxious.      Physical Exam Updated Vital Signs BP (!) 210/77 (BP Location: Right Arm)   Pulse (!) 53   Temp (!) 97.4 F (36.3 C) (Oral)   Resp 15   SpO2 99%   Physical Exam Vitals signs and nursing note reviewed.  Constitutional:      Appearance: She is well-developed.  HENT:     Head: Normocephalic and atraumatic.     Right Ear: Tympanic membrane, ear canal and external ear normal.     Left Ear: Tympanic membrane, ear canal and external ear normal.     Nose: Nose normal.     Mouth/Throat:     Pharynx: Uvula midline.  Eyes:     General: Lids are normal.        Right eye: No discharge.        Left eye: No discharge.     Extraocular Movements:     Right eye: No nystagmus.     Left eye: No nystagmus.     Conjunctiva/sclera: Conjunctivae normal.     Pupils: Pupils are equal, round, and reactive to light.  Neck:     Musculoskeletal: Normal range of motion and neck supple.  Cardiovascular:     Rate and Rhythm: Normal rate and regular rhythm.     Heart sounds: Normal heart sounds.  Pulmonary:     Effort: Pulmonary effort is normal.     Breath sounds: Normal breath sounds.  Abdominal:     Palpations: Abdomen is soft.     Tenderness: There is no abdominal tenderness.  Musculoskeletal:     Cervical back: She exhibits normal range of motion, no tenderness and no bony tenderness.  Skin:    General: Skin is warm and dry.  Neurological:     Mental Status: She is alert and oriented to person, place, and time.     GCS: GCS eye subscore is 4. GCS verbal subscore is 5. GCS motor subscore is 6.     Cranial Nerves: No cranial nerve deficit.     Sensory: No sensory deficit.       Coordination: Coordination normal.     Gait: Gait normal.     Deep Tendon Reflexes: Reflexes are normal and symmetric.      ED Treatments / Results  Labs (all labs ordered are listed, but only abnormal results are displayed) Labs Reviewed  BASIC METABOLIC PANEL - Abnormal; Notable for the following components:      Result Value   CO2 20 (*)    BUN 26 (*)    Creatinine, Ser 1.25 (*)    GFR calc non Af Amer 39 (*)    GFR calc Af Amer 45 (*)  All other components within normal limits  CBC - Abnormal; Notable for the following components:   Platelets 145 (*)    All other components within normal limits  I-STAT TROPONIN, ED    EKG EKG Interpretation  Date/Time:  Sunday February 22 2018 13:08:08 EST Ventricular Rate:  51 PR Interval:    QRS Duration: 117 QT Interval:  490 QTC Calculation: 452 R Axis:   -24 Text Interpretation:  Sinus rhythm Probable left ventricular hypertrophy Borderline T abnormalities, inferior leads Confirmed by Lennice Sites [phone]) on 02/22/2018 1:20:11 PM   Radiology Dg Chest 2 View  Result Date: 02/22/2018 CLINICAL DATA:  Patient complains of intermittent dizziness and feeling fatigued x 2 weeks. Today patient took her BP at home and found to be 180s/100. Patient reports that she takes her BP meds daily. EXAM: CHEST - 2 VIEW COMPARISON:  08/21/2015 FINDINGS: Cardiac silhouette is mildly enlarged. No mediastinal or hilar masses. No evidence of adenopathy. Clear lungs.  No pleural effusion or pneumothorax. Skeletal structures are demineralized. Mild compression deformity at the thoracolumbar junction, stable from the prior exam. IMPRESSION: 1. No acute cardiopulmonary disease. 2. Mild cardiomegaly. Electronically Signed   By: Lajean Manes M.D.   On: 02/22/2018 14:18    Procedures Procedures (including critical care time)  Medications Ordered in ED Medications  amLODipine (NORVASC) tablet 2.5 mg (2.5 mg Oral Given 02/22/18 1342)     Initial  Impression / Assessment and Plan / ED Course  I have reviewed the triage vital signs and the nursing notes.  Pertinent labs & imaging results that were available during my care of the patient were reviewed by me and considered in my medical decision making (see chart for details).     Patient seen and examined. Work-up initiated.  Patient has essentially normal exam.  Will give additional dose of amlodipine 2.5mg .  Discussed with Dr. Ronnald Nian who will see.  Vital signs reviewed and are as follows: BP (!) 210/77 (BP Location: Right Arm)   Pulse (!) 53   Temp (!) 97.4 F (36.3 C) (Oral)   Resp 15   SpO2 99%   3:41 PM lab work reviewed with patient and family at bedside.  They are concerned about the patient's persistently elevated blood pressure.  We discussed that lab work and exam is reassuring.  Plan is currently to increase Norvasc to 5 mg daily.  Will recheck blood pressure.  4:18 PM blood pressure still elevated.  Offered patient and daughter choice of additional blood pressure medications were discharged home with plan as above.  They are comfortable with going home now.  Patient encouraged to try to relax tonight as this should help her blood pressure.  I have sent a message to patient's PCP letting them know about change in medication.  Patient counseled to return if they have weakness in their arms or legs, slurred speech, trouble walking or talking, confusion, trouble with their balance, or if they have any other concerns. Patient verbalizes understanding and agrees with plan.     Final Clinical Impressions(s) / ED Diagnoses   Final diagnoses:  Essential hypertension   Patient with hypertension.  No focal neurological symptoms.  No other signs of endorgan damage today.  Renal function is stable.  No evidence of cardiac ischemia or pulmonary overload.  Patient will need to follow-up with her primary care doctor for fine-tuning of her blood pressure medication.  ED Discharge  Orders    None       Carlisle Cater,  PA-C 02/22/18 [place], [place], DO 02/22/18 1624

## 2018-02-22 NOTE — Discharge Instructions (Signed)
Please read and follow all provided instructions.  Your diagnoses today include:  1. Essential hypertension     Your blood pressure was high today (BP): BP (!) 198/72    Pulse (!) 51    Temp (!) 97.4 F (36.3 C) (Oral)    Resp 13    SpO2 100%   Tests performed today include:  Vital signs. See below for your results today.   Blood counts and electrolytes - kidney function looks good  Blood test for the heart - no signs of heart or lung problem  Medications prescribed:   None  Home care instructions:  Follow any educational materials contained in this packet.  Please double your amlodipine dose from 2.5 mg to 5 mg once a day.  Follow-up with your doctor for further instructions.  Follow-up instructions: Please follow-up with your primary care provider in the next 2-3 days for a recheck of your symptoms and blood pressure.    Return instructions:   Please return to the Emergency Department if you experience worsening symptoms.   Return with severe chest pain, abdominal pain, or shortness of breath.   Return with severe headache, focal weakness, numbness, difficulty with speech or vision.  Return with loss of vision or sudden vision change.  Please return if you have any other emergent concerns.  Additional Information:  Your vital signs today were: BP (!) 198/72    Pulse (!) 51    Temp (!) 97.4 F (36.3 C) (Oral)    Resp 13    SpO2 100%  If your blood pressure (BP) was elevated above 135/85 this visit, please have this repeated by your doctor within one month. --------------

## 2018-02-22 NOTE — ED Triage Notes (Signed)
Patient complains of intermittent dizziness and feeling fatigued x 2 weeks. Today patient took her BP at home and found to be 180s/100. Patient reports that she takes her BP meds daily. Denies pain, alert and oriented, no neuro deficits

## 2018-02-22 NOTE — ED Triage Notes (Signed)
PT is hypertensive today. PT has monitored her BP at home and it has fluctuated recently. PT reports slight blurred vision. Daughter notes a tremor recently.

## 2018-02-22 NOTE — Discharge Instructions (Signed)
With high blood pressure and changes in balance and vision, you need to be seen int he ER

## 2018-02-22 NOTE — ED Provider Notes (Signed)
[facility]    CSN: [id number] Arrival date & time: 02/22/18  1053     History   Chief Complaint Chief Complaint  Patient presents with  . Hypertension    HPI Whitney Henderson is a 85 y.o. female.   HPI  Patient presented to the urgent care center with an elevated blood pressure.  She also has some blurred vision, off balance, and is not feeling well.  She states that she feels like she is leaning to one side.  After speaking with the patient briefly I ascertained that she needed to be in the emergency room and transferred here.  Vital signs reviewed.  No physical exam except for my observations  Past Medical History:  Diagnosis Date  . Anxiety   . Cataracts, bilateral   . Headache 04/27/2015   Left>right periorbital  . Hypertension   . Memory difficulties 09/04/2016    Patient Active Problem List   Diagnosis Date Noted  . Chronic renal disease, stage III ([place]) 12/11/2017  . Hypertension, essential, benign 10/30/2017  . Anxiety 10/11/2017  . Memory difficulties 09/04/2016  . Headache 04/27/2015  . Hypertension 01/16/2013  . UTI (lower urinary tract infection) 01/16/2013    Past Surgical History:  Procedure Laterality Date  . ABDOMINAL HYSTERECTOMY    . BREAST EXCISIONAL BIOPSY Left over 25 ys ago   benign  . CHOLECYSTECTOMY    . EXCISIONAL HEMORRHOIDECTOMY      OB History   No obstetric history on file.      Home Medications    Prior to Admission medications   Medication Sig Start Date End Date Taking? Authorizing Provider  Alpha-D-Galactosidase (BEANO MELTAWAYS) TABS Take by mouth.    [provider]  amLODipine (NORVASC) 2.5 MG tablet TAKE 1 TABLET(2.5 MG) BY MOUTH DAILY 02/02/18   [name], MD  ASPIRIN 81 PO Take 81 mg by mouth as needed (for pain or headache).     [provider]  betamethasone dipropionate (DIPROLENE) 0.05 % cream Apply topically 2 (two) times daily.    [provider]  cholecalciferol  (VITAMIN D) 1000 units tablet Take 1,000 Units by mouth every 30 (thirty) days.     [provider]  Multiple Vitamins-Minerals (CENTRUM SILVER PO) Take by mouth.    [provider]  Multiple Vitamins-Minerals (PRESERVISION AREDS 2 PO) Take by mouth.    [provider]  Propylene Glycol (SYSTANE BALANCE OP) Place 1 drop into both eyes daily as needed (for itchy eyes). Uses when needed.     [provider]  vitamin C (ASCORBIC ACID) 500 MG tablet Take 500 mg by mouth daily.    [provider]    Family History Family History  Problem Relation Age of Onset  . Cancer Father   . Breast cancer Daughter        48s    Social History Social History   Tobacco Use  . Smoking status: Former Smoker    Years: 2.00  . Smokeless tobacco: Never Used  Substance Use Topics  . Alcohol use: No  . Drug use: No     Allergies   Codeine   Review of Systems Review of Systems  Constitutional: Negative for chills and fever.  HENT: Negative for ear pain and sore throat.   Eyes: Positive for visual disturbance. Negative for pain.  Respiratory: Negative for cough and shortness of breath.   Cardiovascular: Negative for chest pain and palpitations.  Gastrointestinal: Negative for abdominal pain and  vomiting.  Genitourinary: Negative for dysuria and hematuria.  Musculoskeletal: Negative for arthralgias and back pain.  Skin: Negative for color change and rash.  Neurological: Positive for dizziness, light-headedness and headaches. Negative for seizures and syncope.  All other systems reviewed and are negative.    Physical Exam Triage Vital Signs ED Triage Vitals  Enc Vitals Group     BP 02/22/18 1219 (!) 201/68     Pulse Rate 02/22/18 1219 (!) 55     Resp 02/22/18 1219 16     Temp 02/22/18 1219 98.8 F (37.1 C)     Temp Source 02/22/18 1219 Oral     SpO2 02/22/18 1219 100 %     Weight --      Height --      Head Circumference --      Peak Flow  --      Pain Score 02/22/18 1217 0     Pain Loc --      Pain Edu? --      Excl. in University? --    No data found.  Updated Vital Signs BP (!) 201/68   Pulse (!) 55   Temp 98.8 F (37.1 C) (Oral)   Resp 16   SpO2 100%   Visual Acuity Right Eye Distance:   Left Eye Distance:   Bilateral Distance:    Right Eye Near:   Left Eye Near:    Bilateral Near:     Physical Exam Constitutional:      Appearance: She is well-developed and normal weight.     Comments: Speaks slowly.  Pleasant  HENT:     Head: Normocephalic and atraumatic.  Pulmonary:     Effort: Pulmonary effort is normal.  Musculoskeletal: Normal range of motion.     Comments: Moves all 4 extremities symmetric  Skin:    General: Skin is warm and dry.  Neurological:     General: No focal deficit present.     Mental Status: She is alert.  Psychiatric:        Mood and Affect: Mood normal.      UC Treatments / Results  Labs (all labs ordered are listed, but only abnormal results are displayed) Labs Reviewed - No data to display  EKG None  Radiology No results found.  Procedures Procedures (including critical care time)  Medications Ordered in UC Medications - No data to display  Initial Impression / Assessment and Plan / UC Course  I have reviewed the triage vital signs and the nursing notes.  Pertinent labs & imaging results that were available during my care of the patient were reviewed by me and considered in my medical decision making (see chart for details).     Transferred to the emergency room for symptomatic elevated blood pressure Final Clinical Impressions(s) / UC Diagnoses   Final diagnoses:  Essential hypertension     Discharge Instructions     With high blood pressure and changes in balance and vision, you need to be seen int he ER   ED Prescriptions    None     Controlled Substance Prescriptions [place] Controlled Substance Registry consulted? No   Raylene Everts,  MD 02/22/18 [phone]

## 2018-03-03 ENCOUNTER — Encounter: Payer: Self-pay | Admitting: Internal Medicine

## 2018-03-03 ENCOUNTER — Ambulatory Visit (INDEPENDENT_AMBULATORY_CARE_PROVIDER_SITE_OTHER): Payer: Medicare Other | Admitting: Internal Medicine

## 2018-03-03 VITALS — Temp 97.4°F | Ht 61.0 in | Wt 132.8 lb

## 2018-03-03 DIAGNOSIS — F4321 Adjustment disorder with depressed mood: Secondary | ICD-10-CM

## 2018-03-03 DIAGNOSIS — I129 Hypertensive chronic kidney disease with stage 1 through stage 4 chronic kidney disease, or unspecified chronic kidney disease: Secondary | ICD-10-CM | POA: Insufficient documentation

## 2018-03-03 DIAGNOSIS — N183 Chronic kidney disease, stage 3 unspecified: Secondary | ICD-10-CM

## 2018-03-03 DIAGNOSIS — F419 Anxiety disorder, unspecified: Secondary | ICD-10-CM | POA: Diagnosis not present

## 2018-03-03 DIAGNOSIS — Z09 Encounter for follow-up examination after completed treatment for conditions other than malignant neoplasm: Secondary | ICD-10-CM | POA: Diagnosis not present

## 2018-03-03 MED ORDER — AMLODIPINE BESYLATE 2.5 MG PO TABS: 2.5000 mg | ORAL_TABLET | Freq: Two times a day (BID) | ORAL | 1 refills | Status: DC

## 2018-03-03 MED ORDER — SERTRALINE HCL 25 MG PO TABS: 25.0000 mg | ORAL_TABLET | Freq: Every day | ORAL | 1 refills | Status: DC

## 2018-03-03 NOTE — Progress Notes (Signed)
Subjective:     Patient ID: Whitney Henderson , female    DOB: 22-Dec-1928 , 85 y.o.   MRN: [id number]   Chief Complaint  Patient presents with  . Hospitalization Follow-up    HPI  She is here today for ER f/u.  She is accompanied by her daughter, V. N.  She was initially seen on [facility] on 2/2 for elevated blood pressure and was then transferred to the emergency room.  At the time, she was taking 2.5mg  amlodipine daily.  Prior to that, she was prescribed 5mg  daily. However, she stopped it because she "didn't feel too good on it". After having a negative workup, the EDP increased her dose of amlodipine to 2-2.5mg  daily. She is unable to tolerate both pills in am, so she takes one at breakfast and one midday.     Past Medical History:  Diagnosis Date  . Anxiety   . Cataracts, bilateral   . Headache 04/27/2015   Left>right periorbital  . Hypertension   . Memory difficulties 09/04/2016     Family History  Problem Relation Age of Onset  . Cancer Father   . Breast cancer Daughter        51s     Current Outpatient Medications:  .  Alpha-D-Galactosidase (BEANO MELTAWAYS) TABS, Take 1 tablet by mouth daily as needed (gas). , Disp: , Rfl:  .  amLODipine (NORVASC) 2.5 MG tablet, Take 1 tablet (2.5 mg total) by mouth 2 (two) times daily., Disp: 180 tablet, Rfl: 1 .  ASPIRIN 81 PO, Take 81 mg by mouth as needed (for pain or headache). , Disp: , Rfl:  .  betamethasone dipropionate (DIPROLENE) 0.05 % cream, Apply 1 application topically 2 (two) times daily. , Disp: , Rfl:  .  cholecalciferol (VITAMIN D) 1000 units tablet, Take 1,000 Units by mouth daily. , Disp: , Rfl:  .  Multiple Vitamins-Minerals (CENTRUM SILVER PO), Take by mouth., Disp: , Rfl:  .  Multiple Vitamins-Minerals (PRESERVISION AREDS 2 PO), Take 1 tablet by mouth daily. , Disp: , Rfl:  .  vitamin C (ASCORBIC ACID) 500 MG tablet, Take 500 mg by mouth daily., Disp: , Rfl:  .  Propylene Glycol (SYSTANE BALANCE OP), Place 1  drop into both eyes daily as needed (for itchy eyes). Uses when needed. , Disp: , Rfl:  .  sertraline (ZOLOFT) 25 MG tablet, Take 1 tablet (25 mg total) by mouth daily. Take with evening meal, Disp: 30 tablet, Rfl: 1   Allergies  Allergen Reactions  . Codeine Rash     Review of Systems  Constitutional: Negative.   Respiratory: Negative.   Cardiovascular: Negative.   Gastrointestinal: Negative.   Neurological: Negative.   Psychiatric/Behavioral: The patient is nervous/anxious.      Today's Vitals   03/03/18 1127  Temp: (!) 97.4 F (36.3 C)  TempSrc: Oral  Weight: 132 lb 12.8 oz (60.2 kg)  Height: 5\' 1"  (1.549 m)  PainSc: 0-No pain   Body mass index is 25.09 kg/m.   Objective:  Physical Exam Vitals signs and nursing note reviewed.  Constitutional:      Appearance: Normal appearance.  HENT:     Head: Normocephalic and atraumatic.  Cardiovascular:     Rate and Rhythm: Normal rate and regular rhythm.     Heart sounds: Normal heart sounds.  Pulmonary:     Effort: Pulmonary effort is normal.     Breath sounds: Normal breath sounds.  Skin:    General: Skin  is warm.  Neurological:     General: No focal deficit present.     Mental Status: She is alert.         Assessment And Plan:     1. Hypertensive nephropathy  Fair control. She was advised to take amlodipine 2.5mg  twice daily. She verbalizes understanding of her treatment plan. Her daughter is present who also verbalizes understanding of instructions.   2. Chronic renal disease, stage III (HCC)  Chronic, I will check a GFR, Cr at her next visit. She is encouraged to stay well hydrated. .   3. Anxiety  I will start her on sertraline 25mg  with evening meal. She reports she was previously given xanax by her previous PCP. Pt advised that I would like to try other medications due to the addictive nature of xanax. She does agree to therapy.   - Ambulatory referral to Psychology  4. Unresolved grief  -  Ambulatory referral to Psychology        Maximino Greenland, MD

## 2018-03-03 NOTE — Patient Instructions (Signed)
Coping With Loss, Adult  People experience loss in many different ways throughout their lives. Events such as moving, changing jobs, and losing friends can create a sense of loss. The loss may be as serious as a major health change, divorce, death of a pet, or death of a loved one. All of these types of loss are likely to create a physical and emotional reaction known as grief. Grief is the result of a major change or an absence of something or someone that you count on. Grief is a normal reaction to loss.  How to recognize changes  A variety of factors can affect your grieving experience, including:  · The nature of your loss.  · Your relationship to what or whom you lost.  · Your understanding of grief and how to cope with it.  · Your support system.  The way that you deal with your grief will affect your ability to function as you normally do. When you are grieving, you may experience:  · Numbness, shock, sadness, anxiety, anger, denial, and guilt.  · Thoughts about death.  · Unexpected crying.  · A physical sensation of emptiness in your gut.  · Problems sleeping and eating.  · Fatigue.  · Loss of interest in normal activities.  · Dreaming about or imagining seeing the person who died.  · A need to remember what or whom you lost.  · Difficulty thinking about anything other than your loss for a period of time.  · Relief. If you have been expecting the loss for a while, you may feel a sense of relief when it happens.  Where to find support  To get support for coping with loss:  · Ask your health care provider for help and recommendations, such as grief counseling or therapy.  · Think about joining a support group for people who are coping with loss.  Follow these instructions at home:    · Be patient with yourself and others. Allow the grieving process to happen, and remember that grieving takes time.  ? It is likely that you may never feel completely done with some grief. You may find a way to move on while  still cherishing memories and feelings about your loss.  ? Accepting your loss is a process. It can take months or longer to adjust.  · Express your feelings in healthy ways, such as:  ? Talking with others about your loss. It may be helpful to find others who have had a similar loss, such as a support group.  ? Writing down your feelings in a journal.  ? Doing physical activities to release stress and emotional energy.  ? Doing creative activities like painting, sculpting, or playing or listening to music.  ? Practicing resilience. This is the ability to recover and adjust after facing challenges. Reading some resources that encourage resilience may help you to learn ways to practice those behaviors.  · Keep to your normal routine as much as possible. If you have trouble focusing or doing normal activities, it is acceptable to take some time away from your normal routine.  · Spend time with friends and loved ones.  · Eat a healthy diet, get plenty of sleep, and rest when you feel tired.  Where to find more information  You can find more information about coping with loss from:  · American Society of Clinical Oncology: www.cancer.net  · American Psychological Association: www.apa.org  Contact a health care provider if:  · Your grief   is extreme and keeps getting worse.  · You have ongoing grief that does not improve.  · Your body shows symptoms of grief, such as illness.  · You feel depressed, anxious, or lonely.  Get help right away if:  · You have thoughts about hurting yourself or others.  If you ever feel like you may hurt yourself or others, or have thoughts about taking your own life, get help right away. You can go to your nearest emergency department or call:  · Your local emergency services (911 in the [place]  · A suicide crisis helpline, such as the National Suicide Prevention Lifeline at 1-800-273-8255. This is open 24 hours a day.  Summary  · Grief is a normal part of experiencing a loss. It is the result  of a major change or an absence of something or someone that you count on.  · The depth of grief and the period of recovery depend on the type of loss as well as your ability to adjust to the change and process your feelings.  · Processing grief requires patience and a willingness to accept your feelings and talk about your loss with people who are supportive.  · It is important to find resources that work for you and to realize that we are all different when it comes to grief. There is not one single grieving process that works for everyone in the same way.  · Be aware that when grief becomes extreme, it can lead to more severe issues like isolation, depression, anxiety, or suicidal thoughts. Talk with your health care provider if you have any of these issues.  This information is not intended to replace advice given to you by your health care provider. Make sure you discuss any questions you have with your health care provider.  Document Released: 05/23/2016 Document Revised: 05/23/2016 Document Reviewed: 05/23/2016  Elsevier Interactive Patient Education © 2019 [employer]

## 2018-03-31 ENCOUNTER — Ambulatory Visit (INDEPENDENT_AMBULATORY_CARE_PROVIDER_SITE_OTHER): Payer: Medicare Other | Admitting: Internal Medicine

## 2018-03-31 ENCOUNTER — Encounter: Payer: Self-pay | Admitting: Internal Medicine

## 2018-03-31 VITALS — BP 136/70 | HR 56 | Ht 61.0 in | Wt 129.6 lb

## 2018-03-31 DIAGNOSIS — N183 Chronic kidney disease, stage 3 unspecified: Secondary | ICD-10-CM

## 2018-03-31 DIAGNOSIS — F4321 Adjustment disorder with depressed mood: Secondary | ICD-10-CM | POA: Diagnosis not present

## 2018-03-31 DIAGNOSIS — I129 Hypertensive chronic kidney disease with stage 1 through stage 4 chronic kidney disease, or unspecified chronic kidney disease: Secondary | ICD-10-CM | POA: Diagnosis not present

## 2018-03-31 DIAGNOSIS — F419 Anxiety disorder, unspecified: Secondary | ICD-10-CM

## 2018-03-31 DIAGNOSIS — F4320 Adjustment disorder, unspecified: Secondary | ICD-10-CM | POA: Insufficient documentation

## 2018-03-31 MED ORDER — SERTRALINE HCL 25 MG PO TABS: 25.0000 mg | ORAL_TABLET | Freq: Every day | ORAL | 1 refills | Status: DC

## 2018-04-02 DIAGNOSIS — H40013 Open angle with borderline findings, low risk, bilateral: Secondary | ICD-10-CM | POA: Diagnosis not present

## 2018-04-02 DIAGNOSIS — H1013 Acute atopic conjunctivitis, bilateral: Secondary | ICD-10-CM | POA: Diagnosis not present

## 2018-04-02 DIAGNOSIS — H04123 Dry eye syndrome of bilateral lacrimal glands: Secondary | ICD-10-CM | POA: Diagnosis not present

## 2018-04-19 NOTE — Progress Notes (Signed)
Subjective:     Patient ID: Whitney Henderson , female    DOB: [birth year] , 85 y.o.   MRN: [id number]   Chief Complaint  Patient presents with  . Hypertension    follow up,    HPI  She is here today for a blood pressure check.  She is accompanied by her daughter.  She denies headache, chest pain and shortness of breath. She reports compliance with meds.   Hypertension      Past Medical History:  Diagnosis Date  . Anxiety   . Cataracts, bilateral   . Headache 04/27/2015   Left>right periorbital  . Hypertension   . Memory difficulties 09/04/2016     Family History  Problem Relation Age of Onset  . Cancer Father   . Breast cancer Daughter        68s     Current Outpatient Medications:  .  amLODipine (NORVASC) 2.5 MG tablet, Take 1 tablet (2.5 mg total) by mouth 2 (two) times daily., Disp: 180 tablet, Rfl: 1 .  ASPIRIN 81 PO, Take 81 mg by mouth as needed (for pain or headache). , Disp: , Rfl:  .  cholecalciferol (VITAMIN D) 1000 units tablet, Take 1,000 Units by mouth daily. , Disp: , Rfl:  .  Multiple Vitamins-Minerals (CENTRUM SILVER PO), Take by mouth., Disp: , Rfl:  .  Multiple Vitamins-Minerals (PRESERVISION AREDS 2 PO), Take 1 tablet by mouth daily. , Disp: , Rfl:  .  Propylene Glycol (SYSTANE BALANCE OP), Place 1 drop into both eyes daily as needed (for itchy eyes). Uses when needed. , Disp: , Rfl:  .  vitamin C (ASCORBIC ACID) 500 MG tablet, Take 500 mg by mouth daily., Disp: , Rfl:  .  Alpha-D-Galactosidase (BEANO MELTAWAYS) TABS, Take 1 tablet by mouth daily as needed (gas). , Disp: , Rfl:  .  betamethasone dipropionate (DIPROLENE) 0.05 % cream, Apply 1 application topically 2 (two) times daily. , Disp: , Rfl:  .  sertraline (ZOLOFT) 25 MG tablet, Take 1 tablet (25 mg total) by mouth daily., Disp: 90 tablet, Rfl: 1   Allergies  Allergen Reactions  . Codeine Rash     Review of Systems  Constitutional: Negative.   Respiratory: Negative.   Cardiovascular:  Negative.   Gastrointestinal: Negative.   Neurological: Negative.   Psychiatric/Behavioral: The patient is nervous/anxious.      Today's Vitals   03/31/18 1415  BP: 136/70  Pulse: (!) 56  Weight: 129 lb 9.6 oz (58.8 kg)  Height: 5\' 1"  (1.549 m)   Body mass index is 24.49 kg/m.   Objective:  Physical Exam Vitals signs and nursing note reviewed.  Constitutional:      Appearance: Normal appearance.  HENT:     Head: Normocephalic and atraumatic.  Cardiovascular:     Rate and Rhythm: Normal rate and regular rhythm.     Heart sounds: Normal heart sounds.  Pulmonary:     Effort: Pulmonary effort is normal.     Breath sounds: Normal breath sounds.  Skin:    General: Skin is warm.  Neurological:     General: No focal deficit present.     Mental Status: She is alert.  Psychiatric:        Mood and Affect: Mood normal.        Behavior: Behavior normal.         Assessment And Plan:     1. Hypertensive nephropathy  Controlled. She will continue with current meds. She is encouraged  to avoid adding salt to her foods.   2. Chronic renal disease, stage III (HCC)  Chronic. I will check renal function today. She is encouraged to stay well hydrated.   3. Anxiety  Chronic. She is agreeable to psychiatry evaluation. She prefers a female provider.   - Ambulatory referral to Psychiatry  4. Grief reaction  She declines grief counseling. It has been a year since her husband passed unexpectedly.     Maximino Greenland, MD

## 2018-04-30 ENCOUNTER — Other Ambulatory Visit: Payer: Medicare Other

## 2018-05-23 ENCOUNTER — Other Ambulatory Visit: Payer: Self-pay | Admitting: Internal Medicine

## 2018-05-26 ENCOUNTER — Other Ambulatory Visit: Payer: Self-pay | Admitting: Internal Medicine

## 2018-05-26 ENCOUNTER — Other Ambulatory Visit: Payer: Self-pay

## 2018-05-26 ENCOUNTER — Ambulatory Visit (INDEPENDENT_AMBULATORY_CARE_PROVIDER_SITE_OTHER): Payer: Medicare Other | Admitting: Internal Medicine

## 2018-05-26 ENCOUNTER — Encounter: Payer: Self-pay | Admitting: Internal Medicine

## 2018-05-26 VITALS — BP 142/70 | HR 64 | Temp 97.9°F | Ht 61.0 in | Wt 129.4 lb

## 2018-05-26 DIAGNOSIS — N183 Chronic kidney disease, stage 3 unspecified: Secondary | ICD-10-CM

## 2018-05-26 DIAGNOSIS — L309 Dermatitis, unspecified: Secondary | ICD-10-CM | POA: Diagnosis not present

## 2018-05-26 DIAGNOSIS — F419 Anxiety disorder, unspecified: Secondary | ICD-10-CM

## 2018-05-26 DIAGNOSIS — I129 Hypertensive chronic kidney disease with stage 1 through stage 4 chronic kidney disease, or unspecified chronic kidney disease: Secondary | ICD-10-CM

## 2018-05-26 NOTE — Patient Instructions (Signed)

## 2018-05-27 LAB — BMP8+EGFR
BUN/Creatinine Ratio: 12 (ref 12–28)
BUN: 16 mg/dL (ref 8–27)
CO2: 21 mmol/L (ref 20–29)
Calcium: 9.1 mg/dL (ref 8.7–10.3)
Chloride: 102 mmol/L (ref 96–106)
Creatinine, Ser: 1.33 mg/dL — ABNORMAL HIGH (ref 0.57–1.00)
GFR calc Af Amer: 41 mL/min/{1.73_m2} — ABNORMAL LOW (ref >59)
GFR calc non Af Amer: 36 mL/min/{1.73_m2} — ABNORMAL LOW (ref >59)
Glucose: 91 mg/dL (ref 65–99)
Potassium: 4.6 mmol/L (ref 3.5–5.2)
Sodium: 139 mmol/L (ref 134–144)

## 2018-05-29 ENCOUNTER — Telehealth: Payer: Self-pay

## 2018-05-29 NOTE — Telephone Encounter (Signed)
Called pt and gave lab results.

## 2018-05-29 NOTE — Telephone Encounter (Signed)
Left the patient a message to call back for lab results. 

## 2018-05-29 NOTE — Telephone Encounter (Signed)
-----   Message from [name], MD sent at 05/28/2018  6:24 PM EDT ----- Your kidney fxn is stable.

## 2018-05-31 NOTE — Progress Notes (Signed)
Subjective:     Patient ID: Whitney Henderson , female    DOB: [birth year] , 85 y.o.   MRN: [id number]   Chief Complaint  Patient presents with  . Med Check    patient presents today for a f/u on her sertraline    HPI  She presents today for f/u anxiety. She was started on sertraline 29m daily at her last visit. She did not have any side effects from the medication. She reports that she is not that concerned about the Coronavirus because she stays home a lot anyway.     Past Medical History:  Diagnosis Date  . Anxiety   . Cataracts, bilateral   . Headache 04/27/2015   Left>right periorbital  . Hypertension   . Memory difficulties 09/04/2016     Family History  Problem Relation Age of Onset  . Cancer Father   . Breast cancer Daughter        567s    Current Outpatient Medications:  .  Alpha-D-Galactosidase (BEANO MELTAWAYS) TABS, Take 1 tablet by mouth daily as needed (gas). , Disp: , Rfl:  .  amLODipine (NORVASC) 2.5 MG tablet, Take 1 tablet (2.5 mg total) by mouth 2 (two) times daily., Disp: 180 tablet, Rfl: 1 .  ASPIRIN 81 PO, Take 81 mg by mouth as needed (for pain or headache). , Disp: , Rfl:  .  betamethasone dipropionate (DIPROLENE) 0.05 % cream, APPLY THIN LAYER EXTERNALLY TO THE AFFECTED AREA EVERY DAY AS NEEDED, Disp: 60 g, Rfl: 2 .  cholecalciferol (VITAMIN D) 1000 units tablet, Take 1,000 Units by mouth daily. , Disp: , Rfl:  .  Ginkgo Biloba 120 MG CAPS, Take 1 capsule by mouth daily at 12 noon., Disp: , Rfl:  .  Multiple Vitamins-Minerals (CENTRUM SILVER PO), Take by mouth., Disp: , Rfl:  .  Multiple Vitamins-Minerals (PRESERVISION AREDS 2 PO), Take 1 tablet by mouth daily. , Disp: , Rfl:  .  Propylene Glycol (SYSTANE BALANCE OP), Place 1 drop into both eyes daily as needed (for itchy eyes). Uses when needed. , Disp: , Rfl:  .  sertraline (ZOLOFT) 25 MG tablet, Take 1 tablet (25 mg total) by mouth daily., Disp: 90 tablet, Rfl: 1 .  Theanine 100 MG CAPS, Take 1  capsule by mouth daily at 12 noon., Disp: , Rfl:  .  vitamin C (ASCORBIC ACID) 500 MG tablet, Take 500 mg by mouth daily., Disp: , Rfl:    Allergies  Allergen Reactions  . Codeine Rash     Review of Systems  Constitutional: Negative.   Respiratory: Negative.   Cardiovascular: Negative.   Gastrointestinal: Negative.   Skin:       She c/o dry patches of skin on her legs/arms. She has had rx betamethasone cream in the past. She would like a refill.   Neurological: Negative.   Psychiatric/Behavioral: Negative.      Today's Vitals   05/26/18 1111  BP: (!) 142/70  Pulse: 64  Temp: 97.9 F (36.6 C)  TempSrc: Oral  Weight: 129 lb 6.4 oz (58.7 kg)  Height: _0  (1.549 m)  PainSc: 0-No pain   Body mass index is 24.45 kg/m.   Objective:  Physical Exam Vitals signs and nursing note reviewed.  Constitutional:      Appearance: Normal appearance.  HENT:     Head: Normocephalic and atraumatic.  Cardiovascular:     Rate and Rhythm: Normal rate and regular rhythm.     Heart sounds: Normal heart  sounds.  Pulmonary:     Effort: Pulmonary effort is normal.     Breath sounds: Normal breath sounds.  Skin:    General: Skin is warm.     Findings: Rash present.     Comments: Scattered areas of hyperkeratotic patches on RUE and RLE.   Neurological:     General: No focal deficit present.     Mental Status: She is alert.  Psychiatric:        Mood and Affect: Mood normal.        Behavior: Behavior normal.         Assessment And Plan:     1. Anxiety  Chronic. She does not wish to increase dose of medication. She has upcoming appt with Dr. Casimiro Needle. I will defer further treatment to him.   2. Eczema, unspecified type  She was given refill of betamethasone cream to apply to affected area twice daily as needed.   3. Hypertensive nephropathy  Fair control. She will continue with current meds. She will rto in 4 months for re-evaluation.   - BMP8+EGFR  4. Chronic renal disease,  stage III (HCC)  Chronic. She is enocuraged to stay well hydrated.   Maximino Greenland, MD    THE PATIENT IS ENCOURAGED TO PRACTICE SOCIAL DISTANCING DUE TO THE COVID-19 PANDEMIC.

## 2018-06-10 ENCOUNTER — Ambulatory Visit: Payer: Medicare Other | Admitting: Internal Medicine

## 2018-06-16 ENCOUNTER — Encounter (HOSPITAL_COMMUNITY): Payer: Self-pay | Admitting: Psychiatry

## 2018-06-16 ENCOUNTER — Ambulatory Visit (INDEPENDENT_AMBULATORY_CARE_PROVIDER_SITE_OTHER): Payer: Medicare Other | Admitting: Psychiatry

## 2018-06-16 ENCOUNTER — Other Ambulatory Visit: Payer: Self-pay

## 2018-06-16 VITALS — BP 158/75 | HR 70 | Temp 97.8°F | Ht 61.0 in | Wt 131.0 lb

## 2018-06-16 DIAGNOSIS — F341 Dysthymic disorder: Secondary | ICD-10-CM | POA: Diagnosis not present

## 2018-06-16 MED ORDER — CITALOPRAM HYDROBROMIDE 10 MG PO TABS: 20.0000 mg | ORAL_TABLET | Freq: Every day | ORAL | 2 refills | Status: DC

## 2018-06-16 NOTE — Progress Notes (Signed)
Psychiatric Initial Adult Assessment   Patient Identification: Whitney Henderson MRN:  [id number] Date of Evaluation:  06/16/2018 Referral Source: Dr. Gertie Exon Chief Complaint:   Visit Diagnosis: Dysthymic disorder  History of Present Illness:    This patient is an 85 year old African-American female who lives alone.  She describes himself is still grieving after the death of her husband in 10-Apr-2017.  They have been married 55 years.  She lives in the same home with him since 1964.  The patient has 4 children 3 grandchildren and 4 great-grandchildren.  All of them are doing well.  Financially the patient is stable.  The patient grew up in [place].  She attended [employer] and went on to get a masters degree at [employer].  The patient denies daily persistent depression.  The patient was acting interviewed in the last 10 minutes with her daughter who claims the patient does cry a lot.  The patient is sleeping and eating well and has good energy.  She denies problems concentrating.  She is not suicidal now and never has been.  The patient likes to read and watch TV.  She goes to church on most Sundays.  She denies ever the use of alcohol or drugs.  She has no psychotic symptoms.  In a close evaluation she denies symptoms consistent with major depression ever in the past.  She denies mania.  She denies symptoms of generalized anxiety disorder panic disorder or obsessive-compulsive disorder.  The patient functions very well.  She continues to drive to take care of her own finances and cooks.  She does all her basic and instrumental ADLs.  Her biggest complaint is the sense of getting older.  And ultimately she will admit that she feels lonely.  She still feels like she is grieving her husband.  The patient has few friends.  I think she is to her husband as her major sounding board and now has no one to talk to him.  She is not close with her neighbors although they are pleasant.  Medically the  patient is actually quite stable.  She has hypertension.  It is well controlled.  She has no significant past psychiatric history.  She is never been in a psychiatric hospital or seen a psychiatrist.  She apparently was started on Zoloft and given a dose of up to 50 mg.  She apparently had troubles and her primary care doctor reduced down to 25 mg.  The patient is not clear that this made any difference.  Associated Signs/Symptoms: Depression Symptoms:  depressed mood, (Hypo) Manic Symptoms:   Anxiety Symptoms:   Psychotic Symptoms:   PTSD Symptoms:   Past Psychiatric History: Zoloft 25 mg  Previous Psychotropic Medications:   Substance Abuse History in the last 12 months:    Consequences of Substance Abuse:   Past Medical History:  Past Medical History:  Diagnosis Date  . Anxiety   . Cataracts, bilateral   . Headache 04/27/2015   Left>right periorbital  . Hypertension   . Memory difficulties 09/04/2016    Past Surgical History:  Procedure Laterality Date  . ABDOMINAL HYSTERECTOMY    . BREAST EXCISIONAL BIOPSY Left over 20 ys ago   benign  . CHOLECYSTECTOMY    . EXCISIONAL HEMORRHOIDECTOMY      Family Psychiatric History:   Family History:  Family History  Problem Relation Age of Onset  . Cancer Father   . Breast cancer Daughter        50 s  Social History:   Social History   Socioeconomic History  . Marital status: Widowed    Spouse name: Izell [place]   . Number of children: 4  . Years of education: Education officer, environmental  . Highest education level: Not on file  Occupational History  . Not on file  Social Needs  . Financial resource strain: Not hard at all  . Food insecurity:    Worry: Never true    Inability: Never true  . Transportation needs:    Medical: No    Non-medical: No  Tobacco Use  . Smoking status: Former Smoker    Years: 2.00  . Smokeless tobacco: Never Used  Substance and Sexual Activity  . Alcohol use: No  . Drug use: No  . Sexual activity:  Not Currently  Lifestyle  . Physical activity:    Days per week: 3 days    Minutes per session: Patient refused  . Stress: Only a little  Relationships  . Social connections:    Talks on phone: Not on file    Gets together: Not on file    Attends religious service: Not on file    Active member of club or organization: Not on file    Attends meetings of clubs or organizations: Not on file    Relationship status: Not on file  Other Topics Concern  . Not on file  Social History Narrative   Live with husband,  4 children.    Caffeine: 1 cup daily     Additional Social History:   Allergies:   Allergies  Allergen Reactions  . Codeine Rash    Metabolic Disorder Labs: No results found for: HGBA1C, MPG No results found for: PROLACTIN Lab Results  Component Value Date   CHOL 244 (H) 10/30/2017   TRIG 67 10/30/2017   HDL 83 10/30/2017   CHOLHDL 2.9 10/30/2017   LDLCALC 148 (H) 10/30/2017   Lab Results  Component Value Date   TSH 2.600 10/30/2017    Therapeutic Level Labs: No results found for: LITHIUM No results found for: CBMZ No results found for: VALPROATE  Current Medications: Current Outpatient Medications  Medication Sig Dispense Refill  . Alpha-D-Galactosidase (BEANO MELTAWAYS) TABS Take 1 tablet by mouth daily as needed (gas).     [name] amLODipine (NORVASC) 2.5 MG tablet Take 1 tablet (2.5 mg total) by mouth 2 (two) times daily. 180 tablet 1  . ASPIRIN 81 PO Take 81 mg by mouth as needed (for pain or headache).     . betamethasone dipropionate (DIPROLENE) 0.05 % cream APPLY THIN LAYER EXTERNALLY TO THE AFFECTED AREA EVERY DAY AS NEEDED 60 g 2  . cholecalciferol (VITAMIN D) 1000 units tablet Take 1,000 Units by mouth daily.     . Ginkgo Biloba 120 MG CAPS Take 1 capsule by mouth daily at 12 noon.    . Multiple Vitamins-Minerals (CENTRUM SILVER PO) Take by mouth.    . Propylene Glycol (SYSTANE BALANCE OP) Place 1 drop into both eyes daily as needed (for itchy eyes).  Uses when needed.     . sertraline (ZOLOFT) 25 MG tablet Take 1 tablet (25 mg total) by mouth daily. 90 tablet 1  . Theanine 100 MG CAPS Take 1 capsule by mouth daily at 12 noon.    . vitamin C (ASCORBIC ACID) 500 MG tablet Take 500 mg by mouth daily.    . citalopram (CELEXA) 10 MG tablet Take 2 tablets (20 mg total) by mouth daily. 30 tablet 2  . Multiple  Vitamins-Minerals (PRESERVISION AREDS 2 PO) Take 1 tablet by mouth daily.      No current facility-administered medications for this visit.     Musculoskeletal: Strength & Muscle Tone: within normal limits Gait & Station: normal Patient leans: Normal  Psychiatric Specialty Exam: ROS  Blood pressure (!) 158/75, pulse 70, temperature 97.8 F (36.6 C), height 5\' 1"  (1.549 m), weight 131 lb (59.4 kg).Body mass index is 24.75 kg/m.  General Appearance: Casual  Eye Contact:  Good  Speech:  Clear and Coherent  Volume:  Normal  Mood:  Dysphoric  Affect:  Negative  Thought Process:  Goal Directed  Orientation:  Full (Time, Place, and Person)  Thought Content:  WDL  Suicidal Thoughts:  No  Homicidal Thoughts:  No  Memory:  Negative  Judgement:  Good  Insight:  Good  Psychomotor Activity:  Normal  Concentration:    Recall:  Good  Fund of Knowledge:Good  Language: Good  Akathisia:  No  Handed:  Right  AIMS (if indicated):  not done  Assets:  Desire for Improvement  ADL's:  Intact  Cognition: Normal  Sleep: Normal   Screenings: Mini-Mental     Office Visit from 09/04/2016 in [facility]  Total Score (max 30 points )  26    PHQ2-9     Office Visit from 05/26/2018 in [facility] Office Visit from 02/09/2018 in [place] from 10/30/2017 in [facility]  PHQ-2 Total Score  0  6  2  PHQ-9 Total Score  -  8  4      Assessment and Plan:   At this time I believe the patient is having uncomplicated bereavement.  It is either  this condition or chronic mild depression.  She is not been able to completely rebound since the death of her husband in 20-Apr-2017.  She did have a few grieving sessions through hospice but apparently not much more.  Her daughter appropriately is insistent that the patient has a therapist.  We will make attempts to get her a therapist.  We will also give her the number to [facility] for her to consider a day treatment program to go to during the week.  Today we will discontinue her Zoloft.  We will change her to Celexa 10 mg in the morning.  It is hopeful with the combination of a change in the antidepressant getting a therapist and perhaps going to a day treatment program will help this patient.  She is actually functioning well and she is not suicidal.  She will return to see me in 7 weeks.   Jerral Ralph, MD 5/26/20204:30 PM

## 2018-06-26 ENCOUNTER — Other Ambulatory Visit: Payer: Medicare Other

## 2018-08-12 ENCOUNTER — Other Ambulatory Visit: Payer: Self-pay

## 2018-08-12 ENCOUNTER — Ambulatory Visit
Admission: RE | Admit: 2018-08-12 | Discharge: 2018-08-12 | Disposition: A | Discharge status: Home / Self Care | Payer: Medicare Other | Source: Ambulatory Visit | Attending: Internal Medicine | Admitting: Internal Medicine

## 2018-08-12 DIAGNOSIS — M810 Age-related osteoporosis without current pathological fracture: Secondary | ICD-10-CM | POA: Diagnosis not present

## 2018-08-12 DIAGNOSIS — M8588 Other specified disorders of bone density and structure, other site: Secondary | ICD-10-CM | POA: Diagnosis not present

## 2018-08-12 DIAGNOSIS — Z780 Asymptomatic menopausal state: Secondary | ICD-10-CM | POA: Diagnosis not present

## 2018-08-12 DIAGNOSIS — E2839 Other primary ovarian failure: Secondary | ICD-10-CM

## 2018-09-09 ENCOUNTER — Other Ambulatory Visit: Payer: Self-pay | Admitting: Internal Medicine

## 2018-09-09 DIAGNOSIS — Z1231 Encounter for screening mammogram for malignant neoplasm of breast: Secondary | ICD-10-CM

## 2018-09-18 ENCOUNTER — Ambulatory Visit (INDEPENDENT_AMBULATORY_CARE_PROVIDER_SITE_OTHER): Payer: Medicare Other | Admitting: Psychiatry

## 2018-09-18 ENCOUNTER — Encounter (HOSPITAL_COMMUNITY): Payer: Self-pay | Admitting: Psychiatry

## 2018-09-18 ENCOUNTER — Other Ambulatory Visit: Payer: Self-pay

## 2018-09-18 VITALS — BP 122/68 | HR 70 | Temp 97.6°F | Ht 61.0 in | Wt 130.8 lb

## 2018-09-18 DIAGNOSIS — F341 Dysthymic disorder: Secondary | ICD-10-CM

## 2018-09-18 NOTE — Progress Notes (Signed)
Psychiatric Initial Adult Assessment   Patient Identification: Whitney Henderson MRN:  [id number] Date of Evaluation:  09/18/2018 Referral Source: Dr. Gertie Exon Chief Complaint:   Visit Diagnosis: Dysthymic disorder  History of Present Illness:   Today the patient is at her baseline.  She is seen with her daughter.  The patient's mood is up and down.  She is not persistently depressed.  She sleeps and eats well.  She reads a lot of Darrick Meigs books enjoys TV.  The patient would like to exercise and she thinks she would if the gym was open.  Her energy level is reasonably good.  She drinks no alcohol.  Her interventions were to change her to Celexa which she took for about a month or 2.  There was some confusion about the refills but the reality is I do not believe this medicine would help.  In fact I do not believe this patient has major depression.  My interventions were to try to get her into therapy and have her involved in [facility].  It turns out with the virus they were too frightened to get anybody else involved.  They are still interested in some form of treatment but at this time I will not prescribe any medications.  The patient's daughter and the patient are leaning toward some health food supplements from a health food store.  At this time the patient is functioning very well. Depression Symptoms:  depressed mood, (Hypo) Manic Symptoms:   Anxiety Symptoms:   Psychotic Symptoms:   PTSD Symptoms:   Past Psychiatric History: Zoloft 25 mg  Previous Psychotropic Medications:   Substance Abuse History in the last 12 months:    Consequences of Substance Abuse:   Past Medical History:  Past Medical History:  Diagnosis Date  . Anxiety   . Cataracts, bilateral   . Headache 04/27/2015   Left>right periorbital  . Hypertension   . Memory difficulties 09/04/2016    Past Surgical History:  Procedure Laterality Date  . ABDOMINAL HYSTERECTOMY    . BREAST EXCISIONAL BIOPSY  Left over 47 ys ago   benign  . CHOLECYSTECTOMY    . EXCISIONAL HEMORRHOIDECTOMY      Family Psychiatric History:   Family History:  Family History  Problem Relation Age of Onset  . Cancer Father   . Breast cancer Daughter        11s    Social History:   Social History   Socioeconomic History  . Marital status: Widowed    Spouse name: Izell    . Number of children: 4  . Years of education: Education officer, environmental  . Highest education level: Not on file  Occupational History  . Not on file  Social Needs  . Financial resource strain: Not hard at all  . Food insecurity    Worry: Never true    Inability: Never true  . Transportation needs    Medical: No    Non-medical: No  Tobacco Use  . Smoking status: Former Smoker    Years: 2.00  . Smokeless tobacco: Never Used  Substance and Sexual Activity  . Alcohol use: No  . Drug use: No  . Sexual activity: Not Currently  Lifestyle  . Physical activity    Days per week: 3 days    Minutes per session: Patient refused  . Stress: Only a little  Relationships  . Social Herbalist on phone: Not on file    Gets together: Not on file  Attends religious service: Not on file    Active member of club or organization: Not on file    Attends meetings of clubs or organizations: Not on file    Relationship status: Not on file  Other Topics Concern  . Not on file  Social History Narrative   Live with husband,  4 children.    Caffeine: 1 cup daily     Additional Social History:   Allergies:   Allergies  Allergen Reactions  . Codeine Rash    Metabolic Disorder Labs: No results found for: HGBA1C, MPG No results found for: PROLACTIN Lab Results  Component Value Date   CHOL 244 (H) 10/30/2017   TRIG 67 10/30/2017   HDL 83 10/30/2017   CHOLHDL 2.9 10/30/2017   LDLCALC 148 (H) 10/30/2017   Lab Results  Component Value Date   TSH 2.600 10/30/2017    Therapeutic Level Labs: No results found for: LITHIUM No  results found for: CBMZ No results found for: VALPROATE  Current Medications: Current Outpatient Medications  Medication Sig Dispense Refill  . Alpha-D-Galactosidase (BEANO MELTAWAYS) TABS Take 1 tablet by mouth daily as needed (gas).     [name] amLODipine (NORVASC) 2.5 MG tablet Take 1 tablet (2.5 mg total) by mouth 2 (two) times daily. 180 tablet 1  . ASPIRIN 81 PO Take 81 mg by mouth as needed (for pain or headache).     . betamethasone dipropionate (DIPROLENE) 0.05 % cream APPLY THIN LAYER EXTERNALLY TO THE AFFECTED AREA EVERY DAY AS NEEDED 60 g 2  . cholecalciferol (VITAMIN D) 1000 units tablet Take 1,000 Units by mouth daily.     . Ginkgo Biloba 120 MG CAPS Take 1 capsule by mouth daily at 12 noon.    . Multiple Vitamins-Minerals (CENTRUM SILVER PO) Take by mouth.    . Multiple Vitamins-Minerals (PRESERVISION AREDS 2 PO) Take 1 tablet by mouth daily.     [name] Propylene Glycol (SYSTANE BALANCE OP) Place 1 drop into both eyes daily as needed (for itchy eyes). Uses when needed.     . sertraline (ZOLOFT) 25 MG tablet Take 1 tablet (25 mg total) by mouth daily. 90 tablet 1  . Theanine 100 MG CAPS Take 1 capsule by mouth daily at 12 noon.    . vitamin C (ASCORBIC ACID) 500 MG tablet Take 500 mg by mouth daily.     No current facility-administered medications for this visit.     Musculoskeletal: Strength & Muscle Tone: within normal limits Gait & Station: normal Patient leans: Normal  Psychiatric Specialty Exam: ROS  Blood pressure 122/68, pulse 70, temperature 97.6 F (36.4 C), height 5\' 1"  (1.549 m), weight 130 lb 12.8 oz (59.3 kg).Body mass index is 24.71 kg/m.  General Appearance: Casual  Eye Contact:  Good  Speech:  Clear and Coherent  Volume:  Normal  Mood:  Dysphoric  Affect:  Negative  Thought Process:  Goal Directed  Orientation:  Full (Time, Place, and Person)  Thought Content:  WDL  Suicidal Thoughts:  No  Homicidal Thoughts:  No  Memory:  Negative  Judgement:  Good   Insight:  Good  Psychomotor Activity:  Normal  Concentration:    Recall:  Good  Fund of Knowledge:Good  Language: Good  Akathisia:  No  Handed:  Right  AIMS (if indicated):  not done  Assets:  Desire for Improvement  ADL's:  Intact  Cognition: Normal  Sleep: Normal   Screenings: Mini-Mental     Office Visit from 09/04/2016 in  [facility]  Total Score (max 30 points )  26    PHQ2-9     Office Visit from 05/26/2018 in [place] Visit from 02/09/2018 in [facility] from 10/30/2017 in [facility]  PHQ-2 Total Score  0  6  2  PHQ-9 Total Score  -  8  4      Assessment and Plan:   At this time I believe the patient has either a dysthymic disorder or a complicated bereavement process.  I think most likely she is also simply having trouble adjusting to the death of her husband which happened over a year ago.  She does not seem to be having any anniversary reactions.  I believe the patient is mainly suffering from loneliness.  I suspect there are some length that his issues that could be a focus in some form of supportive psychotherapy.  My recommendations were not to take any medications but rather to allow our office to call her back to get her an appointment to see 1 of our therapists or a therapist in the community.  But we would not really initiate that for her to come in until about 3 months from now when hopefully when the virus was more controlled.  The patient and her daughter seem to be relieved and agreed with this plan.  She will actually not get a return appointment with me at this time.  She was told if anything changes she could always call back to be seen again. Jerral Ralph, MD 8/28/202010:27 AM

## 2018-10-01 DIAGNOSIS — H40013 Open angle with borderline findings, low risk, bilateral: Secondary | ICD-10-CM | POA: Diagnosis not present

## 2018-10-01 DIAGNOSIS — Z961 Presence of intraocular lens: Secondary | ICD-10-CM | POA: Diagnosis not present

## 2018-10-01 DIAGNOSIS — H353131 Nonexudative age-related macular degeneration, bilateral, early dry stage: Secondary | ICD-10-CM | POA: Diagnosis not present

## 2018-10-01 DIAGNOSIS — H35033 Hypertensive retinopathy, bilateral: Secondary | ICD-10-CM | POA: Diagnosis not present

## 2018-10-12 ENCOUNTER — Telehealth: Payer: Self-pay | Admitting: Internal Medicine

## 2018-10-12 NOTE — Telephone Encounter (Signed)
I called the patient to ask if she can come at 8:30 instead of 9:45 tomorrow so that she can have AWV before seeing Dr. Baird Cancer, but she said it was too early so I left appointment as is.

## 2018-10-13 ENCOUNTER — Ambulatory Visit: Payer: Self-pay

## 2018-10-13 ENCOUNTER — Encounter: Payer: Self-pay | Admitting: Internal Medicine

## 2018-10-13 ENCOUNTER — Ambulatory Visit (INDEPENDENT_AMBULATORY_CARE_PROVIDER_SITE_OTHER): Payer: Medicare Other | Admitting: Internal Medicine

## 2018-10-13 ENCOUNTER — Other Ambulatory Visit: Payer: Self-pay

## 2018-10-13 ENCOUNTER — Ambulatory Visit (INDEPENDENT_AMBULATORY_CARE_PROVIDER_SITE_OTHER): Payer: Medicare Other

## 2018-10-13 VITALS — BP 138/70 | HR 88 | Temp 98.3°F | Ht 61.0 in | Wt 126.8 lb

## 2018-10-13 DIAGNOSIS — I129 Hypertensive chronic kidney disease with stage 1 through stage 4 chronic kidney disease, or unspecified chronic kidney disease: Secondary | ICD-10-CM

## 2018-10-13 DIAGNOSIS — N183 Chronic kidney disease, stage 3 unspecified: Secondary | ICD-10-CM

## 2018-10-13 DIAGNOSIS — M545 Low back pain, unspecified: Secondary | ICD-10-CM

## 2018-10-13 DIAGNOSIS — I10 Essential (primary) hypertension: Secondary | ICD-10-CM

## 2018-10-13 DIAGNOSIS — M810 Age-related osteoporosis without current pathological fracture: Secondary | ICD-10-CM | POA: Diagnosis not present

## 2018-10-13 NOTE — Chronic Care Management (AMB) (Signed)
Chronic Care Management    Social Work General Note  10/13/2018 Name: Whitney Henderson MRN: [id number] DOB: 1928/07/15  Whitney Henderson is a 85 y.o. year old female who is a primary care patient of [name], MD. The CCM was consulted to assist the patient with chronic care management.  Whitney Henderson was given information about Chronic Care Management services today including:  1. CCM service includes personalized support from designated clinical staff supervised by her physician, including individualized plan of care and coordination with other care providers 2. 24/7 contact phone numbers for assistance for urgent and routine care needs. 3. Service will only be billed when office clinical staff spend 20 minutes or more in a month to coordinate care. 4. Only one practitioner may furnish and bill the service in a calendar month. 5. The patient may stop CCM services at any time (effective at the end of the month) by phone call to the office staff. 6. The patient will be responsible for cost sharing (co-pay) of up to 20% of the service fee (after annual deductible is met).  Patient agreed to services and verbal consent obtained.   Review of patient status, including review of consultants reports, relevant laboratory and other test results, and collaboration with appropriate care team members and the patient's provider was performed as part of comprehensive patient evaluation and provision of chronic care management services.    SDOH (Social Determinants of Health) screening performed today. See Care Plan Entry related to challenges with: Transportation  Advanced Directives Status: The patient reports having an Forensic scientist. SW requested a copy for patient record.  Outpatient Encounter Medications as of 10/13/2018  Medication Sig  . Alpha-D-Galactosidase (BEANO MELTAWAYS) TABS Take 1 tablet by mouth daily as needed (gas).   [name] amLODipine (NORVASC) 2.5 MG tablet Take 1 tablet (2.5 mg total)  by mouth 2 (two) times daily.  . ASPIRIN 81 PO Take 81 mg by mouth as needed (for pain or headache).   . betamethasone dipropionate (DIPROLENE) 0.05 % cream APPLY THIN LAYER EXTERNALLY TO THE AFFECTED AREA EVERY DAY AS NEEDED  . cholecalciferol (VITAMIN D) 1000 units tablet Take 1,000 Units by mouth daily.   . Ginkgo Biloba 120 MG CAPS Take 1 capsule by mouth daily at 12 noon.  . Multiple Vitamins-Minerals (CENTRUM SILVER PO) Take by mouth.  . Multiple Vitamins-Minerals (PRESERVISION AREDS 2 PO) Take 1 tablet by mouth daily.   [name] Propylene Glycol (SYSTANE BALANCE OP) Place 1 drop into both eyes daily as needed (for itchy eyes). Uses when needed.   . sertraline (ZOLOFT) 25 MG tablet Take 1 tablet (25 mg total) by mouth daily.  . Theanine 100 MG CAPS Take 1 capsule by mouth daily at 12 noon.  . vitamin C (ASCORBIC ACID) 500 MG tablet Take 500 mg by mouth daily.   No facility-administered encounter medications on file as of 10/13/2018.     Goals Addressed            This Visit's Progress     Patient Stated   . "I need alternative transportation options for when my daughter can't drive me" (pt-stated)       Current Barriers:  . Unable to provide own transportation . Limited interest in accessing [facility] [employer] services due to ride share program . Limited knowledge of community resource: Music therapist Social Work Clinical Goal(s):  [name] Over the next 45 days, client will work with SW to address concerns related to transportation barriers.  CCM SW Interventions: Completed 10/13/2018 . Outbound call placed to the patient to enroll in Chronic Care Management program. SW spoke with both the patient and her daughter Whitney Henderson whom was in the home . Determined the patient is not longer driving herself but relies on her daughter for transportation needs. Whitney Henderson requests alternative options in the event she is unable to assist . Educated on local options within [facility] including  [employer] and [employer] . Advised by the patient she was not interested in [facility] at this time due to concern of riding a large bus with several other community members . Obtained consent to place a referral to [employer] program via the [employer] . Advised the patient she would receive a packet in the mail in the coming weeks and to complete and return to [employer] . Scheduled follow up call to the patient over the next month to assess progression of patient stated goal  Patient Self Care Activities:  . Currently UNABLE TO independently provide own transportation  Initial goal documentation     . "My Prolia is too expensive" (pt-stated)       Current Barriers:  . Inability to pay high monthly co-pays  Clinical Social Work Clinical Goal(s):  [name] Over the next 45 days the patient will work with embedded PharmD to address problems related to medication costs.  CCM SW Interventions: Completed 10/13/2018 . Outbound call to the patient to review referral reason "prolia $319 or so from [facility]" . Advised the patient to expect a call from embedded PharmD Whitney Henderson to assist with medication cost concerns . Collaboration with Mrs Whitney Henderson via in basket message regarding patient referral  Patient Self Care Activities:  . Attends all scheduled provider appointments . Calls pharmacy for medication refills . Performs ADL's independently  Initial goal documentation       Other   . Assist with Chronic Case Management by assessing patients ability to independently manage chronic conditions       Current Barriers:  [name] Knowledge barriers related to the patients ability to manage Chronic conditions including HTN and CKD III  Clinical Social Work Clinical Goal(s):  [name] Over the next 30 days the patient will work with CCM RN Case Manager to establish an individualized care plan related to the management of patients identified chronic conditions   CCM SW Interventions: Completed 10/13/2018 . Patient interviewed and appropriate assessments performed . Assessed for the patients ability to verbalize current chronic medical conditions. The patient identified current conditioned as high blood pressure and stage three kidney disease . Determined the patient lacks education on how to best manage kidney disease especially related to appropriate diet . Advised the patient RN Case Manager would provide education surrounding diet restrictions during outreach calls . Scheduled an inititial call with RN Case Manager for October 5th . Advised the patient to gather all medications both over the counter and prescribed to go over with RN during scheduled call . Collaboration with RN Case Manager regarding patient concern for disease management related to diet  Patient Self Care Activities:  . Currently UNABLE TO independently manage chronic conditions  Initial goal documentation     . COMPLETED: Assist with enrollment into the Chronic Care Management program by performing Social Determinants of Health screen and gather disease specific information       Current Barriers:  [name] Knowledge Barriers related to resources and support available to address needs related to Chronic Care Management  and challenges surrounding Social Determinants of Health  Clinical Social Work Clinical Goal(s):   Over the next 30 days the patient will work with Chronic Care Management team to address patient stated care management goals  CCM SW Interventions: Completed 10/13/2018  Outbound call placed to the patient to introduce the Chronic Care Management program  Patient education provided to both the patient and her daughter Whitney Henderson) regarding referral placed by Dr. [name]   Obtained verbal consent for enrollment  Patient screened for SDOH (Social Determinants of Health)  Identified challenges with:Transportation  Provided education about SW role and plan to  address identified SDOH challenge (See separate care plan)  Collaboration with CCM team to communicate patient's enrollment into the program  Patient Self Care Activities:   Patient currently unable to independently manage chronic conditions   Patient currently unable to identify community resources to assist with transportation challenges  Initial goal documentation: See individual goal documentation for details related to specific interventions         Follow Up Plan: SW will follow up with patient by phone over the next month to assist with transportation resources.      Daneen Schick, BSW, CDP Social Worker, Certified Dementia Practitioner [facility] [place] [facility] [phone]  Total time spent performing care coordination and/or care management activities with the patient by phone or face to face = 34 minutes.

## 2018-10-13 NOTE — Patient Instructions (Addendum)
Please pick up Salon Pas Lidocaine patch from the pharmacy - use as directed    Chronic Back Pain When back pain lasts longer than 3 months, it is called chronic back pain. Pain may get worse at certain times (flare-ups). There are things you can do at home to manage your pain. Follow these instructions at home: Activity      Avoid bending and other activities that make pain worse.  When standing: ? Keep your upper back and neck straight. ? Keep your shoulders pulled back. ? Avoid slouching.  When sitting: ? Keep your back straight. ? Relax your shoulders. Do not round your shoulders or pull them backward.  Do not sit or stand in one place for long periods of time.  Take short rest breaks during the day. Lying down or standing is usually better than sitting. Resting can help relieve pain.  When sitting or lying down for a long time, do some mild activity or stretching. This will help to prevent stiffness and pain.  Get regular exercise. Ask your doctor what activities are safe for you.  Do not lift anything that is heavier than 10 lb (4.5 kg). To prevent injury when you lift things: ? Bend your knees. ? Keep the weight close to your body. ? Avoid twisting. Managing pain  If told, put ice on the painful area. Your doctor may tell you to use ice for 24-48 hours after a flare-up starts. ? Put ice in a plastic bag. ? Place a towel between your skin and the bag. ? Leave the ice on for 20 minutes, 2-3 times a day.  If told, put heat on the painful area as often as told by your doctor. Use the heat source that your doctor recommends, such as a moist heat pack or a heating pad. ? Place a towel between your skin and the heat source. ? Leave the heat on for 20-30 minutes. ? Remove the heat if your skin turns bright red. This is especially important if you are unable to feel pain, heat, or cold. You may have a greater risk of getting burned.  Soak in a warm bath. This can help  relieve pain.  Take over-the-counter and prescription medicines only as told by your doctor. General instructions  Sleep on a firm mattress. Try lying on your side with your knees slightly bent. If you lie on your back, put a pillow under your knees.  Keep all follow-up visits as told by your doctor. This is important. Contact a doctor if:  You have pain that does not get better with rest or medicine. Get help right away if:  One or both of your arms or legs feel weak.  One or both of your arms or legs lose feeling (numbness).  You have trouble controlling when you poop (bowel movement) or pee (urinate).  You feel sick to your stomach (nauseous).  You throw up (vomit).  You have belly (abdominal) pain.  You have shortness of breath.  You pass out (faint). Summary  When back pain lasts longer than 3 months, it is called chronic back pain.  Pain may get worse at certain times (flare-ups).  Use ice and heat as told by your doctor. Your doctor may tell you to use ice after flare-ups. This information is not intended to replace advice given to you by your health care provider. Make sure you discuss any questions you have with your health care provider. Document Released: 06/26/2007 Document Revised: 04/30/2018 Document  Reviewed: 08/22/2016 Elsevier Patient Education  [place].   Denosumab injection What is this medicine? DENOSUMAB (den oh sue mab) slows bone breakdown. Prolia is used to treat osteoporosis in women after menopause and in men, and in people who are taking corticosteroids for 6 months or more. Delton See is used to treat a high calcium level due to cancer and to prevent bone fractures and other bone problems caused by multiple myeloma or cancer bone metastases. Delton See is also used to treat giant cell tumor of the bone. This medicine may be used for other purposes; ask your health care provider or pharmacist if you have questions. COMMON BRAND NAME(S): Prolia,  XGEVA What should I tell my health care provider before I take this medicine? They need to know if you have any of these conditions:  dental disease  having surgery or tooth extraction  infection  kidney disease  low levels of calcium or Vitamin D in the blood  malnutrition  on hemodialysis  skin conditions or sensitivity  thyroid or parathyroid disease  an unusual reaction to denosumab, other medicines, foods, dyes, or preservatives  pregnant or trying to get pregnant  breast-feeding How should I use this medicine? This medicine is for injection under the skin. It is given by a health care professional in a hospital or clinic setting. A special MedGuide will be given to you before each treatment. Be sure to read this information carefully each time. For Prolia, talk to your pediatrician regarding the use of this medicine in children. Special care may be needed. For Delton See, talk to your pediatrician regarding the use of this medicine in children. While this drug may be prescribed for children as young as 13 years for selected conditions, precautions do apply. Overdosage: If you think you have taken too much of this medicine contact a poison control center or emergency room at once. NOTE: This medicine is only for you. Do not share this medicine with others. What if I miss a dose? It is important not to miss your dose. Call your doctor or health care professional if you are unable to keep an appointment. What may interact with this medicine? Do not take this medicine with any of the following medications:  other medicines containing denosumab This medicine may also interact with the following medications:  medicines that lower your chance of fighting infection  steroid medicines like prednisone or cortisone This list may not describe all possible interactions. Give your health care provider a list of all the medicines, herbs, non-prescription drugs, or dietary supplements  you use. Also tell them if you smoke, drink alcohol, or use illegal drugs. Some items may interact with your medicine. What should I watch for while using this medicine? Visit your doctor or health care professional for regular checks on your progress. Your doctor or health care professional may order blood tests and other tests to see how you are doing. Call your doctor or health care professional for advice if you get a fever, chills or sore throat, or other symptoms of a cold or flu. Do not treat yourself. This drug may decrease your body's ability to fight infection. Try to avoid being around people who are sick. You should make sure you get enough calcium and vitamin D while you are taking this medicine, unless your doctor tells you not to. Discuss the foods you eat and the vitamins you take with your health care professional. See your dentist regularly. Brush and floss your teeth as directed.  Before you have any dental work done, tell your dentist you are receiving this medicine. Do not become pregnant while taking this medicine or for 5 months after stopping it. Talk with your doctor or health care professional about your birth control options while taking this medicine. Women should inform their doctor if they wish to become pregnant or think they might be pregnant. There is a potential for serious side effects to an unborn child. Talk to your health care professional or pharmacist for more information. What side effects may I notice from receiving this medicine? Side effects that you should report to your doctor or health care professional as soon as possible:  allergic reactions like skin rash, itching or hives, swelling of the face, lips, or tongue  bone pain  breathing problems  dizziness  jaw pain, especially after dental work  redness, blistering, peeling of the skin  signs and symptoms of infection like fever or chills; cough; sore throat; pain or trouble passing urine  signs  of low calcium like fast heartbeat, muscle cramps or muscle pain; pain, tingling, numbness in the hands or feet; seizures  unusual bleeding or bruising  unusually weak or tired Side effects that usually do not require medical attention (report to your doctor or health care professional if they continue or are bothersome):  constipation  diarrhea  headache  joint pain  loss of appetite  muscle pain  runny nose  tiredness  upset stomach This list may not describe all possible side effects. Call your doctor for medical advice about side effects. You may report side effects to FDA at 1-800-FDA-1088. Where should I keep my medicine? This medicine is only given in a clinic, doctor's office, or other health care setting and will not be stored at home. NOTE: This sheet is a summary. It may not cover all possible information. If you have questions about this medicine, talk to your doctor, pharmacist, or health care provider.  2020 Elsevier/Gold Standard (2017-05-16 16:10:44)

## 2018-10-13 NOTE — Progress Notes (Signed)
Subjective:     Patient ID: Whitney Henderson , female    DOB: Jul 18, 1928 , 85 y.o.   MRN: [id number]   Chief Complaint  Patient presents with  . Back Pain    HPI  Back Pain This is a recurrent problem. The problem occurs constantly. The problem is unchanged. The pain is present in the lumbar spine. The quality of the pain is described as aching. The pain does not radiate. The pain is at a severity of 6/10. The pain is moderate. The pain is the same all the time. The symptoms are aggravated by lying down and sitting. Stiffness is present all day. Pertinent negatives include no abdominal pain, bladder incontinence, bowel incontinence, leg pain or perianal numbness. Risk factors include lack of exercise and history of osteoporosis. She has tried analgesics for the symptoms. The treatment provided mild relief.     Past Medical History:  Diagnosis Date  . Anxiety   . Cataracts, bilateral   . Headache 04/27/2015   Left>right periorbital  . Hypertension   . Memory difficulties 09/04/2016     Family History  Problem Relation Age of Onset  . Cancer Father   . Breast cancer Daughter        40s     Current Outpatient Medications:  .  Alpha-D-Galactosidase (BEANO MELTAWAYS) TABS, Take 1 tablet by mouth daily as needed (gas). , Disp: , Rfl:  .  amLODipine (NORVASC) 2.5 MG tablet, Take 1 tablet (2.5 mg total) by mouth 2 (two) times daily., Disp: 180 tablet, Rfl: 1 .  ASPIRIN 81 PO, Take 81 mg by mouth as needed (for pain or headache). , Disp: , Rfl:  .  betamethasone dipropionate (DIPROLENE) 0.05 % cream, APPLY THIN LAYER EXTERNALLY TO THE AFFECTED AREA EVERY DAY AS NEEDED, Disp: 60 g, Rfl: 2 .  cholecalciferol (VITAMIN D) 1000 units tablet, Take 1,000 Units by mouth daily. , Disp: , Rfl:  .  Ginkgo Biloba 120 MG CAPS, Take 1 capsule by mouth daily at 12 noon., Disp: , Rfl:  .  Multiple Vitamins-Minerals (CENTRUM SILVER PO), Take by mouth., Disp: , Rfl:  .  Multiple Vitamins-Minerals  (PRESERVISION AREDS 2 PO), Take 1 tablet by mouth daily. , Disp: , Rfl:  .  Propylene Glycol (SYSTANE BALANCE OP), Place 1 drop into both eyes daily as needed (for itchy eyes). Uses when needed. , Disp: , Rfl:  .  sertraline (ZOLOFT) 25 MG tablet, Take 1 tablet (25 mg total) by mouth daily., Disp: 90 tablet, Rfl: 1 .  Theanine 100 MG CAPS, Take 1 capsule by mouth daily at 12 noon., Disp: , Rfl:  .  vitamin C (ASCORBIC ACID) 500 MG tablet, Take 500 mg by mouth daily., Disp: , Rfl:    Allergies  Allergen Reactions  . Codeine Rash     Review of Systems  Constitutional: Negative.   Respiratory: Negative.   Cardiovascular: Negative.   Gastrointestinal: Negative.  Negative for abdominal pain and bowel incontinence.  Genitourinary: Negative for bladder incontinence.  Musculoskeletal: Positive for back pain.  Neurological: Negative.   Psychiatric/Behavioral: Negative.      Today's Vitals   10/13/18 1009  BP: 138/70  Pulse: 88  Temp: 98.3 F (36.8 C)  TempSrc: Oral  SpO2: 95%  Weight: 126 lb 12.8 oz (57.5 kg)  Height: 5' 1"  (1.549 m)   Body mass index is 23.96 kg/m.   Objective:  Physical Exam Vitals signs and nursing note reviewed.  Constitutional:  Appearance: Normal appearance.  HENT:     Head: Normocephalic and atraumatic.  Cardiovascular:     Rate and Rhythm: Normal rate and regular rhythm.     Heart sounds: Normal heart sounds.  Pulmonary:     Effort: Pulmonary effort is normal.     Breath sounds: Normal breath sounds.  Musculoskeletal:     Lumbar back: She exhibits tenderness.  Skin:    General: Skin is warm.  Neurological:     General: No focal deficit present.     Mental Status: She is alert.  Psychiatric:        Mood and Affect: Mood normal.        Behavior: Behavior normal.         Assessment And Plan:     1. Acute left-sided low back pain without sciatica  She is advised to try OTC lidocaine patches - apply to affected area for 12 hours, may  replace in 12 hours. She will let me know if her sx persist.   2. Age-related osteoporosis without current pathological fracture  She agrees to consider Prolia.  She is advised of possible side effects including osteonecrosis of the jaw. She is advised that she should take vit d and calcium daily. She is also encouraged to participate in weightbearing exercises. I will evaluate calcium level today.   - Referral to Chronic Care Management Services - BMP8+EGFR  3. Hypertensive nephropathy  Chronic, fair control. She will continue with current meds.   4. Chronic renal disease, stage III (HCC)  Chronic, I will check a GFR, Cr today.          Maximino Greenland, MD    THE PATIENT IS ENCOURAGED TO PRACTICE SOCIAL DISTANCING DUE TO THE COVID-19 PANDEMIC.

## 2018-10-13 NOTE — Patient Instructions (Signed)
Social Worker Visit Information  Goals we discussed today:  Goals Addressed            This Visit's Progress     Patient Stated   . "I need alternative transportation options for when my daughter can't drive me" (pt-stated)       Current Barriers:  . Unable to provide own transportation . Limited interest in accessing [facility] [employer] services due to ride share program . Limited knowledge of community resource: Music therapist Social Work Clinical Goal(s):  [name] Over the next 45 days, client will work with SW to address concerns related to transportation barriers.  CCM SW Interventions: Completed 10/13/2018 . Outbound call placed to the patient to enroll in Chronic Care Management program. SW spoke with both the patient and her daughter [name] whom was in the home . Determined the patient is not longer driving herself but relies on her daughter for transportation needs. [name] requests alternative options in the event she is unable to assist . Educated on local options within [facility] including [employer] and [employer] . Advised by the patient she was not interested in [facility] at this time due to concern of riding a large bus with several other community members . Obtained consent to place a referral to [employer] program via the [employer] . Advised the patient she would receive a packet in the mail in the coming weeks and to complete and return to [employer] . Scheduled follow up call to the patient over the next month to assess progression of patient stated goal  Patient Self Care Activities:  . Currently UNABLE TO independently provide own transportation  Initial goal documentation     . "My Prolia is too expensive" (pt-stated)       Current Barriers:  . Inability to pay high monthly co-pays  Clinical Social Work Clinical Goal(s):  [name] Over the next 45 days the patient will work with embedded PharmD to address  problems related to medication costs.  CCM SW Interventions: Completed 10/13/2018 . Outbound call to the patient to review referral reason "prolia $319 or so from [facility]" . Advised the patient to expect a call from embedded PharmD Lottie Dawson to assist with medication cost concerns . Collaboration with [name] via in basket message regarding patient referral  Patient Self Care Activities:  . Attends all scheduled provider appointments . Calls pharmacy for medication refills . Performs ADL's independently  Initial goal documentation       Other   . Assist with Chronic Case Management by assessing patients ability to independently manage chronic conditions       Current Barriers:  [name] Knowledge barriers related to the patients ability to manage Chronic conditions including HTN and CKD III  Clinical Social Work Clinical Goal(s):  [name] Over the next 30 days the patient will work with CCM RN Case Manager to establish an individualized care plan related to the management of patients identified chronic conditions  CCM SW Interventions: Completed 10/13/2018 . Patient interviewed and appropriate assessments performed . Assessed for the patients ability to verbalize current chronic medical conditions. The patient identified current conditioned as high blood pressure and stage three kidney disease . Determined the patient lacks education on how to best manage kidney disease especially related to appropriate diet . Advised the patient RN Case Manager would provide education surrounding diet restrictions during outreach calls . Scheduled an inititial call with RN Case Manager for October  5th . Advised the patient to gather all medications both over the counter and prescribed to go over with RN during scheduled call . Collaboration with RN Case Manager regarding patient concern for disease management related to diet  Patient Self Care Activities:  . Currently UNABLE TO independently manage  chronic conditions  Initial goal documentation     . COMPLETED: Assist with enrollment into the Chronic Care Management program by performing Social Determinants of Health screen and gather disease specific information       Current Barriers:  [name] Knowledge Barriers related to resources and support available to address needs related to Chronic Care Management and challenges surrounding Social Determinants of Health  Clinical Social Work Clinical Goal(s):   Over the next 30 days the patient will work with Chronic Care Management team to address patient stated care management goals  CCM SW Interventions: Completed 10/13/2018  Outbound call placed to the patient to introduce the Chronic Care Management program  Patient education provided to both the patient and her daughter [name]) regarding referral placed by Dr. [name]   Obtained verbal consent for enrollment  Patient screened for SDOH (Social Determinants of Health)  Identified challenges with:Transportation  Provided education about SW role and plan to address identified SDOH challenge (See separate care plan)  Collaboration with CCM team to communicate patient's enrollment into the program  Patient Self Care Activities:   Patient currently unable to independently manage chronic conditions   Patient currently unable to identify community resources to assist with transportation challenges  Initial goal documentation: See individual goal documentation for details related to specific interventions         Materials provided: Verbal education about [employer] provided by phone  Ms. Jacober was given information about Chronic Care Management services today including:  1. CCM service includes personalized support from designated clinical staff supervised by her physician, including individualized plan of care and coordination with other care providers 2. 24/7 contact phone numbers for assistance for urgent and routine  care needs. 3. Service will only be billed when office clinical staff spend 20 minutes or more in a month to coordinate care. 4. Only one practitioner may furnish and bill the service in a calendar month. 5. The patient may stop CCM services at any time (effective at the end of the month) by phone call to the office staff. 6. The patient will be responsible for cost sharing (co-pay) of up to 20% of the service fee (after annual deductible is met).  Patient agreed to services and verbal consent obtained.   The patient verbalized understanding of instructions provided today and declined a print copy of patient instruction materials.   Follow up plan: SW will follow up with patient by phone over the next month.  Daneen Schick, BSW, CDP Social Worker, Certified Dementia Practitioner [place] [facility] [place] [phone]

## 2018-10-13 NOTE — Chronic Care Management (AMB) (Signed)
  Chronic Care Management   Initial Visit Note  10/13/2018 Name: Whitney Henderson MRN: [id number] DOB: [birth year]  Referred by: [name], MD Reason for referral : Chronic Care Management (CCM RNCM Case Collaboration )   [name] is a 85 y.o. year old female who is a primary care patient of [name], MD. The care management team was consulted for assistance with chronic disease management and care coordination needs.   Review of patient status, including review of consultants reports, relevant laboratory and other test results, and collaboration with appropriate care team members and the patient's provider was performed as part of comprehensive patient evaluation and provision of chronic care management services.    I initiated and established the plan of care for Whitney Henderson during one on one collaboration with my clinical care management colleague Daneen Schick BSW who is also engaged with this patient to address social work needs.   Outpatient Encounter Medications as of 10/13/2018  Medication Sig  . Alpha-D-Galactosidase (BEANO MELTAWAYS) TABS Take 1 tablet by mouth daily as needed (gas).   [name] amLODipine (NORVASC) 2.5 MG tablet Take 1 tablet (2.5 mg total) by mouth 2 (two) times daily.  . ASPIRIN 81 PO Take 81 mg by mouth as needed (for pain or headache).   . betamethasone dipropionate (DIPROLENE) 0.05 % cream APPLY THIN LAYER EXTERNALLY TO THE AFFECTED AREA EVERY DAY AS NEEDED  . cholecalciferol (VITAMIN D) 1000 units tablet Take 1,000 Units by mouth daily.   . Ginkgo Biloba 120 MG CAPS Take 1 capsule by mouth daily at 12 noon.  . Multiple Vitamins-Minerals (CENTRUM SILVER PO) Take by mouth.  . Multiple Vitamins-Minerals (PRESERVISION AREDS 2 PO) Take 1 tablet by mouth daily.   [name] Propylene Glycol (SYSTANE BALANCE OP) Place 1 drop into both eyes daily as needed (for itchy eyes). Uses when needed.   . sertraline (ZOLOFT) 25 MG tablet Take 1 tablet (25 mg total) by mouth  daily.  . Theanine 100 MG CAPS Take 1 capsule by mouth daily at 12 noon.  . vitamin C (ASCORBIC ACID) 500 MG tablet Take 500 mg by mouth daily.   No facility-administered encounter medications on file as of 10/13/2018.      Goals Addressed    . Assist with Chronic Care Management and Care Coordination needs       Current Barriers:  [name] Knowledge Barriers related to resources and support available to address needs related to Chronic Care Management and Care Coordination   Case Manager Clinical Goal(s):  [name] Over the next 30 days, patient will work with the CCM team to address needs related to Chronic Care Management and Medication management including financial assistance with cost of Prolia  Interventions:  . Collaborated with BSW and initiated plan of care to address needs related to Chronic disease management  Patient Self Care Activities:  . Attends all scheduled provider appointments . Calls provider office for new concerns or questions  Initial goal documentation         Telephone follow up appointment with care management team member scheduled for: 10/26/18  Barb Merino, RN, BSN, CCM Care Management Coordinator [place] [facility]  Direct Phone: [phone]

## 2018-10-14 ENCOUNTER — Other Ambulatory Visit: Payer: Self-pay

## 2018-10-14 ENCOUNTER — Telehealth: Payer: Self-pay

## 2018-10-14 DIAGNOSIS — I10 Essential (primary) hypertension: Secondary | ICD-10-CM

## 2018-10-14 LAB — BMP8+EGFR
BUN/Creatinine Ratio: 19 (ref 12–28)
BUN: 28 mg/dL — ABNORMAL HIGH (ref 8–27)
CO2: 23 mmol/L (ref 20–29)
Calcium: 9.6 mg/dL (ref 8.7–10.3)
Chloride: 102 mmol/L (ref 96–106)
Creatinine, Ser: 1.48 mg/dL — ABNORMAL HIGH (ref 0.57–1.00)
GFR calc Af Amer: 36 mL/min/{1.73_m2} — ABNORMAL LOW (ref >59)
GFR calc non Af Amer: 31 mL/min/{1.73_m2} — ABNORMAL LOW (ref >59)
Glucose: 95 mg/dL (ref 65–99)
Potassium: 4.6 mmol/L (ref 3.5–5.2)
Sodium: 141 mmol/L (ref 134–144)

## 2018-10-14 MED ORDER — AMLODIPINE BESYLATE 2.5 MG PO TABS: 2.5000 mg | ORAL_TABLET | Freq: Two times a day (BID) | ORAL | 1 refills | Status: DC

## 2018-10-14 NOTE — Telephone Encounter (Signed)
Attempt to give lab results  

## 2018-10-14 NOTE — Telephone Encounter (Signed)
-----   Message from [name], MD sent at 10/14/2018  8:26 AM EDT ----- Your kidney function is stable. Be sure to stay well hydrated. How did the lidocaine patch work for your back?

## 2018-10-26 ENCOUNTER — Other Ambulatory Visit: Payer: Self-pay

## 2018-10-26 ENCOUNTER — Telehealth: Payer: Self-pay

## 2018-10-26 ENCOUNTER — Ambulatory Visit (INDEPENDENT_AMBULATORY_CARE_PROVIDER_SITE_OTHER): Payer: Medicare Other

## 2018-10-26 ENCOUNTER — Ambulatory Visit
Admission: RE | Admit: 2018-10-26 | Discharge: 2018-10-26 | Disposition: A | Discharge status: Home / Self Care | Payer: Medicare Other | Source: Ambulatory Visit | Attending: Internal Medicine | Admitting: Internal Medicine

## 2018-10-26 ENCOUNTER — Ambulatory Visit: Payer: Self-pay

## 2018-10-26 DIAGNOSIS — Z1231 Encounter for screening mammogram for malignant neoplasm of breast: Secondary | ICD-10-CM

## 2018-10-26 DIAGNOSIS — I129 Hypertensive chronic kidney disease with stage 1 through stage 4 chronic kidney disease, or unspecified chronic kidney disease: Secondary | ICD-10-CM

## 2018-10-26 DIAGNOSIS — M810 Age-related osteoporosis without current pathological fracture: Secondary | ICD-10-CM | POA: Diagnosis not present

## 2018-10-26 DIAGNOSIS — I10 Essential (primary) hypertension: Secondary | ICD-10-CM

## 2018-10-26 DIAGNOSIS — N183 Chronic kidney disease, stage 3 unspecified: Secondary | ICD-10-CM

## 2018-10-26 NOTE — Chronic Care Management (AMB) (Signed)
  Chronic Care Management   Social Work Note  10/26/2018 Name: Whitney Henderson MRN: [id number] DOB: October 30, 1928  Incoming call from the patients daughter Whitney Henderson to confirm appointment time with RN Case Manager. SW confirmed time with patients daughter and explained that times vary by a few minutes due to previous appointments running over allotted time. Collaboration with RN Case Manager to confirm patients appointment.  Follow Up Plan: RN Case Manager to follow up with the patient and her daughter as previously scheduled.  Daneen Schick, BSW, CDP Social Worker, Certified Dementia Practitioner [place] [facility] [place] [facility] [phone]

## 2018-10-27 NOTE — Patient Instructions (Signed)
Visit Information  Goals Addressed      Patient Stated   . "I have a lot pain in my back, hips and legs" (pt-stated)       Current Barriers:  [name] Knowledge Deficits related to Self Health management for Osteoarthritis . Impaired Physical Mobility  . Poor gait/balance . Risk for self care deficit  . Risk for falls  Nurse Case Manager Clinical Goal(s):  [name] Over the next 30 days, patient will verbalize understanding of plan for in home PT . Over the next 90 days, patient will verbalize improved physical mobility and improved Self Health management of pain related to Osteoarthritis   CCM RN CM Interventions:  10/26/18 call completed with patient and daughter [name]  . Evaluation of current treatment plan related to Osteoarthritis and patient's adherence to plan as established by provider. . Provided education to patient re: the benefits of Physical Therapy to help develop a safe and effective HEP to help improve physical mobility, ROM, gait, strengthening and overall stamina; assessed for falls; assessed for DME needs and or home safety concerns - patient/daughter denies . Collaborated with Dr. Baird Cancer regarding referral for in home PT . Discussed plans with patient for ongoing care management follow up and provided patient with direct contact information for care management team  Patient Self Care Activities:  . Attends all scheduled provider appointments . Calls pharmacy for medication refills . Attends church or other social activities . Performs ADL's independently . Performs IADL's independently . Calls provider office for new concerns or questions  Initial goal documentation     . "I would like to learn more about nutrition" (pt-stated)       Current Barriers:  [name] Knowledge Deficits related to nutritional requirements for the elderly . Chronic Renal Disease Stage III  Nurse Case Manager Clinical Goal(s):  [name] Over the next 60 days, patient will work with the CCM team to address  needs related to nutrition and hydration  CCM RN CM Interventions:  10/26/18 call completed with patient  . Evaluation of current treatment plan related to current nutritional/caloric intake and patient's adherence to plan as established by provider. . Provided education to patient re: the importance of staying well hydrated and eating a well balanced diet; discussed age related challenges; discussed healthy food choices for the elderly . Discussed plans with patient for ongoing care management follow up and provided patient with direct contact information for care management team . Provided patient with printed educational materials related to choosing healthy meals;  Elderly nutrition 101, Chronic Kidney Disease  Patient Self Care Activities:  . Self administers medications as prescribed . Attends all scheduled provider appointments . Calls pharmacy for medication refills . Attends church or other social activities . Performs ADL's independently . Performs IADL's independently . Calls provider office for new concerns or questions   Initial goal documentation     . "My Prolia is too expensive" (pt-stated)       Current Barriers:  . Inability to pay high monthly co-pays  Clinical Social Work Clinical Goal(s):  [name] Over the next 45 days the patient will work with embedded PharmD to address problems related to medication costs.  CCM RN CM Interventions: 10/26/18 call completed with patient and daughter [name]  . Outbound call to the patient and daughter [name]; discussed need for financial support for cost of "Prolia $319 or so from [facility]" . Advised the patient to expect a call from embedded PharmD Lottie Dawson to assist with medication cost  concerns . Collaboration with [name] via in basket message regarding patient referral  Patient Self Care Activities:  . Attends all scheduled provider appointments . Calls pharmacy for medication refills . Performs ADL's  independently  Please see past updates related to this goal by clicking on the "Past Updates" button in the selected goal        Other   . COMPLETED: Assist with Chronic Care Management and Care Coordination needs       Current Barriers:  [name] Knowledge Barriers related to resources and support available to address needs related to Chronic Care Management and Care Coordination   Case Manager Clinical Goal(s):  [name] Over the next 30 days, patient will work with the CCM team to address needs related to Chronic Care Management and Medication management including financial assistance with cost of Prolia  CCM RN CM Interventions:  10/26/18 call completed with patient and daughter [name]  . Care Plan established with patient - see other goals  Patient Self Care Activities:  . Attends all scheduled provider appointments . Calls provider office for new concerns or questions  Initial goal documentation        The patient verbalized understanding of instructions provided today and declined a print copy of patient instruction materials.   Telephone follow up appointment with care management team member scheduled for: 11/25/18  Barb Merino, RN, BSN, CCM Care Management Coordinator [place] [facility]  Direct Phone: [phone]

## 2018-10-27 NOTE — Chronic Care Management (AMB) (Signed)
Chronic Care Management   Initial Visit Note  10/26/2018 Name: Whitney Henderson MRN: [id number] DOB: 1928/11/23  Referred by: [name], MD Reason for referral : Chronic Care Management ([facility] Out[facility] )   Whitney Henderson is a 85 y.o. year old female who is a primary care patient of [name], MD. The CCM team was consulted for assistance with chronic disease management and care coordination needs related to CKD Stage III Osteoarthritis/Osteoporosis  Review of patient status, including review of consultants reports, relevant laboratory and other test results, and collaboration with appropriate care team members and the patient's provider was performed as part of comprehensive patient evaluation and provision of chronic care management services.    SDOH (Social Determinants of Health) screening performed today: Physical Activity. See Care Plan for related entries.   Advanced Directives Status: N See Care Plan and Vynca application for related entries.   I spoke with Whitney Henderson and her daughter Whitney Henderson to assess for CCM RN CM needs and a care plan was established.   Medications: Outpatient Encounter Medications as of 10/26/2018  Medication Sig  . Alpha-D-Galactosidase (BEANO MELTAWAYS) TABS Take 1 tablet by mouth daily as needed (gas).   [name] amLODipine (NORVASC) 2.5 MG tablet Take 1 tablet (2.5 mg total) by mouth 2 (two) times daily.  . ASPIRIN 81 PO Take 81 mg by mouth as needed (for pain or headache).   . betamethasone dipropionate (DIPROLENE) 0.05 % cream APPLY THIN LAYER EXTERNALLY TO THE AFFECTED AREA EVERY DAY AS NEEDED  . cholecalciferol (VITAMIN D) 1000 units tablet Take 1,000 Units by mouth daily.   . Ginkgo Biloba 120 MG CAPS Take 1 capsule by mouth daily at 12 noon.  . Multiple Vitamins-Minerals (CENTRUM SILVER PO) Take by mouth.  . Multiple Vitamins-Minerals (PRESERVISION AREDS 2 PO) Take 1 tablet by mouth daily.   [name] Propylene Glycol (SYSTANE BALANCE  OP) Place 1 drop into both eyes daily as needed (for itchy eyes). Uses when needed.   . sertraline (ZOLOFT) 25 MG tablet Take 1 tablet (25 mg total) by mouth daily.  . Theanine 100 MG CAPS Take 1 capsule by mouth daily at 12 noon.  . vitamin C (ASCORBIC ACID) 500 MG tablet Take 500 mg by mouth daily.   No facility-administered encounter medications on file as of 10/26/2018.      Objective:  No results found for: HGBA1C Lab Results  Component Value Date   LDLCALC 148 (H) 10/30/2017   CREATININE 1.48 (H) 10/13/2018   BP Readings from Last 3 Encounters:  10/13/18 138/70  05/26/18 (!) 142/70  03/31/18 136/70    Goals Addressed      Patient Stated   . "I have a lot pain in my back, hips and legs" (pt-stated)       Current Barriers:  [name] Knowledge Deficits related to Self Health management for Osteoarthritis . Impaired Physical Mobility  . Poor gait/balance . Henderson for self care deficit  . Henderson for falls  Nurse Case Manager Clinical Goal(s):  [name] Over the next 30 days, patient will verbalize understanding of plan for in home PT . Over the next 90 days, patient will verbalize improved physical mobility and improved Self Health management of pain related to Osteoarthritis   CCM RN CM Interventions:  10/26/18 call completed with patient and daughter Whitney Henderson  . Evaluation of current treatment plan related to Osteoarthritis and patient's adherence to plan as established by provider. . Provided education to patient re: the  benefits of Physical Therapy to help develop a safe and effective HEP to help improve physical mobility, ROM, gait, strengthening and overall stamina; assessed for falls; assessed for DME needs and or home safety concerns - patient/daughter denies . Collaborated with Dr. Baird Cancer regarding referral for in home PT . Discussed plans with patient for ongoing care management follow up and provided patient with direct contact information for care management team  Patient Self  Care Activities:  . Attends all scheduled provider appointments . Calls pharmacy for medication refills . Attends church or other social activities . Performs ADL's independently . Performs IADL's independently . Calls provider office for new concerns or questions  Initial goal documentation     . "I would like to learn more about nutrition" (pt-stated)       Current Barriers:  [name] Knowledge Deficits related to nutritional requirements for the elderly . Chronic Renal Disease Stage III  Nurse Case Manager Clinical Goal(s):  [name] Over the next 60 days, patient will work with the CCM team to address needs related to nutrition and hydration  CCM RN CM Interventions:  10/26/18 call completed with patient  . Evaluation of current treatment plan related to current nutritional/caloric intake and patient's adherence to plan as established by provider. . Provided education to patient re: the importance of staying well hydrated and eating a well balanced diet; discussed age related challenges; discussed healthy food choices for the elderly . Discussed plans with patient for ongoing care management follow up and provided patient with direct contact information for care management team . Provided patient with printed educational materials related to choosing healthy meals;  Elderly nutrition 101, Chronic Kidney Disease  Patient Self Care Activities:  . Self administers medications as prescribed . Attends all scheduled provider appointments . Calls pharmacy for medication refills . Attends church or other social activities . Performs ADL's independently . Performs IADL's independently . Calls provider office for new concerns or questions  Initial goal documentation    . "My Prolia is too expensive" (pt-stated)       Current Barriers:  . Inability to pay high monthly co-pays  Clinical Social Work Clinical Goal(s):  [name] Over the next 45 days the patient will work with embedded PharmD to address  problems related to medication costs.  CCM RN CM Interventions: 10/26/18 call completed with patient and daughter Whitney Henderson  . Outbound call to the patient and daughter Whitney Henderson; discussed need for financial support for cost of "Prolia $319 or so from [facility]" . Advised the patient to expect a call from embedded PharmD Lottie Dawson to assist with medication cost concerns . Collaboration with [name] via in basket message regarding patient referral  Patient Self Care Activities:  . Attends all scheduled provider appointments . Calls pharmacy for medication refills . Performs ADL's independently  Please see past updates related to this goal by clicking on the "Past Updates" button in the selected goal       Other   . COMPLETED: Assist with Chronic Care Management and Care Coordination needs       Current Barriers:  [name] Knowledge Barriers related to resources and support available to address needs related to Chronic Care Management and Care Coordination   Case Manager Clinical Goal(s):  [name] Over the next 30 days, patient will work with the CCM team to address needs related to Chronic Care Management and Medication management including financial assistance with cost of Prolia  CCM RN CM Interventions:  10/26/18 call completed with  patient and daughter Whitney Henderson  . Care Plan established with patient - see other goals  Patient Self Care Activities:  . Attends all scheduled provider appointments . Calls provider office for new concerns or questions  Initial goal documentation        Plan:   Telephone follow up appointment with care management team member scheduled for: 11/25/18  Barb Merino, RN, BSN, CCM Care Management Coordinator [place] [facility]  Direct Phone: [phone]

## 2018-10-30 ENCOUNTER — Ambulatory Visit: Payer: Self-pay

## 2018-11-02 ENCOUNTER — Ambulatory Visit: Payer: Self-pay | Admitting: Pharmacist

## 2018-11-02 DIAGNOSIS — M810 Age-related osteoporosis without current pathological fracture: Secondary | ICD-10-CM

## 2018-11-02 DIAGNOSIS — I10 Essential (primary) hypertension: Secondary | ICD-10-CM

## 2018-11-02 DIAGNOSIS — N183 Chronic kidney disease, stage 3 unspecified: Secondary | ICD-10-CM | POA: Diagnosis not present

## 2018-11-02 NOTE — Progress Notes (Signed)
  Chronic Care Management   Outreach Note  10/30/2018 Name: Whitney Henderson MRN: [id number] DOB: 1928/05/01  Referred by: [name], MD Reason for referral : Chronic Care Management   An unsuccessful telephone outreach was attempted today. The patient was referred to the case management team by for assistance with care management and care coordination.   Follow Up Plan: A HIPPA compliant phone message was left for the patient providing contact information and requesting a return call.  The care management team will reach out to the patient again over the next 1-2 weeks.  Regina Eck, PharmD, BCPS Clinical Pharmacist, [facility] [place]: [phone]

## 2018-11-05 ENCOUNTER — Ambulatory Visit: Payer: Medicare Other

## 2018-11-05 ENCOUNTER — Encounter: Payer: Self-pay | Admitting: Internal Medicine

## 2018-11-05 ENCOUNTER — Other Ambulatory Visit: Payer: Self-pay

## 2018-11-05 ENCOUNTER — Ambulatory Visit (INDEPENDENT_AMBULATORY_CARE_PROVIDER_SITE_OTHER): Payer: Medicare Other | Admitting: Internal Medicine

## 2018-11-05 ENCOUNTER — Ambulatory Visit: Payer: Medicare Other | Admitting: Internal Medicine

## 2018-11-05 VITALS — BP 132/68 | HR 60 | Temp 98.3°F | Ht 61.0 in | Wt 129.2 lb

## 2018-11-05 DIAGNOSIS — M8000XA Age-related osteoporosis with current pathological fracture, unspecified site, initial encounter for fracture: Secondary | ICD-10-CM | POA: Insufficient documentation

## 2018-11-05 DIAGNOSIS — R001 Bradycardia, unspecified: Secondary | ICD-10-CM | POA: Diagnosis not present

## 2018-11-05 DIAGNOSIS — I10 Essential (primary) hypertension: Secondary | ICD-10-CM | POA: Diagnosis not present

## 2018-11-05 DIAGNOSIS — M329 Systemic lupus erythematosus, unspecified: Secondary | ICD-10-CM | POA: Insufficient documentation

## 2018-11-05 MED ORDER — SERTRALINE HCL 25 MG PO TABS: 25.0000 mg | ORAL_TABLET | Freq: Every day | ORAL | 2 refills | Status: DC

## 2018-11-05 NOTE — Progress Notes (Signed)
Subjective:     Patient ID: Whitney Henderson , female    DOB: [birth year] , 85 y.o.   MRN: [id number]   Chief Complaint  Patient presents with  . Hypertension    HPI  Here for  HTN FU, her systolic normally runs in the mid to upper 130's, does not recall the diastolic numbers. She walks 2-3 times a week, but does not know for how long.   Past Medical History:  Diagnosis Date  . Anxiety   . Cataracts, bilateral   . Headache 04/27/2015   Left>right periorbital  . Hypertension   . Memory difficulties 09/04/2016     Family History  Problem Relation Age of Onset  . Cancer Father   . Breast cancer Daughter        26s     Current Outpatient Medications:  .  Alpha-D-Galactosidase (BEANO MELTAWAYS) TABS, Take 1 tablet by mouth daily as needed (gas). , Disp: , Rfl:  .  amLODipine (NORVASC) 2.5 MG tablet, Take 1 tablet (2.5 mg total) by mouth 2 (two) times daily., Disp: 180 tablet, Rfl: 1 .  ASPIRIN 81 PO, Take 81 mg by mouth as needed (for pain or headache). , Disp: , Rfl:  .  betamethasone dipropionate (DIPROLENE) 0.05 % cream, APPLY THIN LAYER EXTERNALLY TO THE AFFECTED AREA EVERY DAY AS NEEDED, Disp: 60 g, Rfl: 2 .  cholecalciferol (VITAMIN D) 1000 units tablet, Take 1,000 Units by mouth daily. , Disp: , Rfl:  .  Ginkgo Biloba 120 MG CAPS, Take 1 capsule by mouth daily at 12 noon., Disp: , Rfl:  .  Multiple Vitamins-Minerals (CENTRUM SILVER PO), Take by mouth., Disp: , Rfl:  .  Multiple Vitamins-Minerals (PRESERVISION AREDS 2 PO), Take 1 tablet by mouth daily. , Disp: , Rfl:  .  Propylene Glycol (SYSTANE BALANCE OP), Place 1 drop into both eyes daily as needed (for itchy eyes). Uses when needed. , Disp: , Rfl:  .  Theanine 100 MG CAPS, Take 1 capsule by mouth daily at 12 noon., Disp: , Rfl:  .  vitamin C (ASCORBIC ACID) 500 MG tablet, Take 500 mg by mouth daily., Disp: , Rfl:    Allergies  Allergen Reactions  . Codeine Rash     Review of Systems  Review of Systems   Constitutional: Negative for diaphoresis and unexpected weight change.  HENT: Negative for tinnitus.   Eyes: Negative for visual disturbance.  Respiratory: Negative for chest tightness and shortness of breath.   Cardiovascular: Negative for chest pain, palpitations and leg swelling.  Gastrointestinal: Negative for constipation, diarrhea and nausea.  Endocrine: Negative for polydipsia, polyphagia and polyuria.  Genitourinary: Negative for dysuria and frequency.  Skin: Negative for rash and wound.  Neurological: Negative for dizziness, speech difficulty, weakness, numbness and headaches.   Today's Vitals   11/05/18 0951  BP: 132/68  Pulse: 60  Temp: 98.3 F (36.8 C)  TempSrc: Oral  Weight: 129 lb 3.2 oz (58.6 kg)  Height: 5\' 1"  (1.549 m)   Body mass index is 24.41 kg/m.   Objective:  Physical Exam   Constitutional: She is oriented to person, place, and time. She appears well-developed and well-nourished. No distress.  HENT:  Head: Normocephalic and atraumatic.  Right Ear: External ear normal.  Left Ear: External ear normal.  Nose: Nose normal.  Eyes: Conjunctivae are normal. Right eye exhibits no discharge. Left eye exhibits no discharge. No scleral icterus.  Neck: Neck supple. No thyromegaly present.  No carotid bruits bilaterally  Cardiovascular: slow rate and regular rhythm.  No murmur heard. Pulmonary/Chest: Effort normal and breath sounds normal. No respiratory distress.  Musculoskeletal: Normal range of motion. She exhibits no edema.  Lymphadenopathy:    She has no cervical adenopathy.  Neurological: She is alert and oriented to person, place, and time.  Skin: Skin is warm and dry. Capillary refill takes less than 2 seconds. No rash noted. She is not diaphoretic.  Psychiatric: She has a normal mood and affect. Her behavior is normal. Judgment and thought content normal.  Nursing note reviewed.  Assessment And Plan:    1. Hypertensive nephropathy- stable. Had BMP  9/22 and creatinine was stable. May continue current medication.   FU with PCP in 3-6 months - CBC no Diff - Lipid Profile - TSH - T3, free - T4, Free  2. Bradycardia-chronic, asymptomatic     No action    Bethene Hankinson RODRIGUEZ-SOUTHWORTH, PA-C    THE PATIENT IS ENCOURAGED TO PRACTICE SOCIAL DISTANCING DUE TO THE COVID-19 PANDEMIC.

## 2018-11-06 LAB — CBC
Hematocrit: 37.8 % (ref 34.0–46.6)
Hemoglobin: 12 g/dL (ref 11.1–15.9)
MCH: 28.6 pg (ref 26.6–33.0)
MCHC: 31.7 g/dL (ref 31.5–35.7)
MCV: 90 fL (ref 79–97)
Platelets: 180 10*3/uL (ref 150–450)
RBC: 4.2 x10E6/uL (ref 3.77–5.28)
RDW: 13.5 % (ref 11.7–15.4)
WBC: 4 10*3/uL (ref 3.4–10.8)

## 2018-11-06 LAB — LIPID PANEL
Chol/HDL Ratio: 2.4 ratio (ref 0.0–4.4)
Cholesterol, Total: 194 mg/dL (ref 100–199)
HDL: 80 mg/dL (ref >39)
LDL Chol Calc (NIH): 103 mg/dL — ABNORMAL HIGH (ref 0–99)
Triglycerides: 57 mg/dL (ref 0–149)
VLDL Cholesterol Cal: 11 mg/dL (ref 5–40)

## 2018-11-06 LAB — T4, FREE: Free T4: 1.36 ng/dL (ref 0.82–1.77)

## 2018-11-06 LAB — TSH: TSH: 2.62 u[IU]/mL (ref 0.450–4.500)

## 2018-11-06 LAB — T3, FREE: T3, Free: 2.5 pg/mL (ref 2.0–4.4)

## 2018-11-09 ENCOUNTER — Telehealth: Payer: Self-pay

## 2018-11-09 ENCOUNTER — Other Ambulatory Visit: Payer: Self-pay | Admitting: Internal Medicine

## 2018-11-09 DIAGNOSIS — M545 Low back pain, unspecified: Secondary | ICD-10-CM

## 2018-11-09 DIAGNOSIS — Z7409 Other reduced mobility: Secondary | ICD-10-CM

## 2018-11-09 NOTE — Telephone Encounter (Signed)
Spoke with pt gave her message from her provider  Rodriguez-Southworth, Sunday Spillers, PA-C  Candiss Norse T, CMA        Please inform pt that her cell count is normal, her cholesterol panel is normal for exception of the bad cholesterol called LDL which is 103. This can come down with exercise, increasing in fiber in diet, eating more onions, garlic, oats, flax seed and nuts. Her thyroid test is normal

## 2018-11-10 ENCOUNTER — Telehealth: Payer: Self-pay

## 2018-11-11 ENCOUNTER — Telehealth: Payer: Self-pay

## 2018-11-11 ENCOUNTER — Ambulatory Visit: Payer: Self-pay

## 2018-11-11 DIAGNOSIS — I10 Essential (primary) hypertension: Secondary | ICD-10-CM

## 2018-11-11 DIAGNOSIS — Z7409 Other reduced mobility: Secondary | ICD-10-CM

## 2018-11-11 NOTE — Patient Instructions (Signed)
Visit Information  Goals Addressed            This Visit's Progress     Patient Stated   . "My Prolia is too expensive & I need a medication for my osteoporosis (pt-stated)       Current Barriers:  . Inability to pay high monthly co-pays  Clinical Social Work Clinical Goal(s):  [name] Over the next 45 days the patient will work with embedded PharmD to address problems related to medication costs.  CCM RN CM Interventions: 10/26/18 call completed with patient and daughter [name]  . Outbound call to the patient and daughter [name]; discussed need for financial support for cost of "Prolia $319 or so from [facility]" . Advised the patient to expect a call from embedded PharmD Lottie Dawson to assist with medication cost concerns . Collaboration with [name] via in basket message regarding patient referral  CCM PharmD Interventions: 11/02/18 call completed with patient and daughter [name] . Prolia is drug of choice for osteoporosis in this 85 year old female given T-score of -2.8 and decreased renal function (CrCl ~20-34mL/min).  FRAX score also calculated and patient could definitely benefit from therapy. . Patient optimized on calcium/vitD . Patient referral placed for physical therapy. . Will attempt [employer] patient assistance for Prolia injection (injection administered every 6 months).  [employer] safety net may also be able to assist.  Will mail daughter information. . Message routed to PCP and CCM RN CM to discuss case  Patient Self Care Activities:  . Attends all scheduled provider appointments . Calls pharmacy for medication refills . Performs ADL's independently  Please see past updates related to this goal by clicking on the "Past Updates" button in the selected goal         The patient verbalized understanding of instructions provided today and declined a print copy of patient instruction materials.   The care management team will reach out to the patient again over the next 14  days.   SIGNATURE Whitney Henderson, PharmD, BCPS Clinical Pharmacist, [facility] [place]: [phone]

## 2018-11-11 NOTE — Chronic Care Management (AMB) (Signed)
  Chronic Care Management   Social Work Note  11/11/2018 Name: Whitney Henderson MRN: [id number] DOB: 12/10/1928  Sw placed an unsuccessful outbound call to the patient to assess progression of transportation goal. HIPAA compliant voice message left requesting return call.   Follow Up Plan: SW will follow up with patient by phone over the next two weeks.  Daneen Schick, BSW, CDP Social Worker, Certified Dementia Practitioner [place] [facility] [place] [facility] [phone]

## 2018-11-11 NOTE — Progress Notes (Signed)
Chronic Care Management   Initial Visit Note  11/02/2018 Name: Whitney Henderson MRN: [id number] DOB: 1928/08/23  Referred by: [name], MD Reason for referral : Chronic Care Management   Whitney Henderson is a 85 y.o. year old female who is a primary care patient of [name], MD. The CCM team was consulted for assistance with chronic disease management and care coordination needs related to CKD Stage III  Review of patient status, including review of consultants reports, relevant laboratory and other test results, and collaboration with appropriate care team members and the patient's provider was performed as part of comprehensive patient evaluation and provision of chronic care management services.    I spoke with Ms. Ficek's daughter, Whitney Henderson, by telephone today.  Advanced Directives Status: N See Care Plan and Vynca application for related entries.   Medications: Outpatient Encounter Medications as of 11/02/2018  Medication Sig  . Alpha-D-Galactosidase (BEANO MELTAWAYS) TABS Take 1 tablet by mouth daily as needed (gas).   [name] amLODipine (NORVASC) 2.5 MG tablet Take 1 tablet (2.5 mg total) by mouth 2 (two) times daily.  . ASPIRIN 81 PO Take 81 mg by mouth as needed (for pain or headache).   . betamethasone dipropionate (DIPROLENE) 0.05 % cream APPLY THIN LAYER EXTERNALLY TO THE AFFECTED AREA EVERY DAY AS NEEDED  . cholecalciferol (VITAMIN D) 1000 units tablet Take 1,000 Units by mouth daily.   . Ginkgo Biloba 120 MG CAPS Take 1 capsule by mouth daily at 12 noon.  . Multiple Vitamins-Minerals (CENTRUM SILVER PO) Take by mouth.  . Multiple Vitamins-Minerals (PRESERVISION AREDS 2 PO) Take 1 tablet by mouth daily.   [name] Propylene Glycol (SYSTANE BALANCE OP) Place 1 drop into both eyes daily as needed (for itchy eyes). Uses when needed.   . Theanine 100 MG CAPS Take 1 capsule by mouth daily at 12 noon.  . vitamin C (ASCORBIC ACID) 500 MG tablet Take 500 mg by mouth daily.  .  [DISCONTINUED] sertraline (ZOLOFT) 25 MG tablet Take 1 tablet (25 mg total) by mouth daily. (Patient not taking: Reported on 11/05/2018)   No facility-administered encounter medications on file as of 11/02/2018.      Objective:   Goals Addressed            This Visit's Progress     Patient Stated   . "My Prolia is too expensive & I need a medication for my osteoporosis (pt-stated)       Current Barriers:  . Inability to pay high monthly co-pays  Clinical Social Work Clinical Goal(s):  [name] Over the next 45 days the patient will work with embedded PharmD to address problems related to medication costs.  CCM RN CM Interventions: 10/26/18 call completed with patient and daughter [name]  . Outbound call to the patient and daughter Whitney Henderson; discussed need for financial support for cost of "Prolia $319 or so from [facility]" . Advised the patient to expect a call from embedded PharmD Lottie Dawson to assist with medication cost concerns . Collaboration with [name] via in basket message regarding patient referral  CCM PharmD Interventions: 11/02/18 call completed with patient and daughter [name] . Prolia is drug of choice for osteoporosis in this 85 year old female given T-score of -2.8 and decreased renal function (CrCl ~20-74mL/min).  FRAX score also calculated and patient could definitely benefit from therapy. . Patient optimized on calcium/vitD . Patient referral placed for physical therapy. . Will attempt [employer] patient assistance for Prolia injection (injection administered every  6 months).  [employer]gen safety net may also be able to assist.  Will mail daughter information. . Message routed to PCP and CCM RN CM to discuss case  Patient Self Care Activities:  . Attends all scheduled provider appointments . Calls pharmacy for medication refills . Performs ADL's independently  Please see past updates related to this goal by clicking on the "Past Updates" button in the selected goal           Plan:   The care management team will reach out to the patient again over the next 14 days.   Provider Signature Regina Eck, PharmD, BCPS Clinical Pharmacist, [facility] [place]: [phone]

## 2018-11-12 ENCOUNTER — Ambulatory Visit: Payer: Self-pay

## 2018-11-12 ENCOUNTER — Other Ambulatory Visit: Payer: Self-pay | Admitting: Pharmacy Technician

## 2018-11-12 DIAGNOSIS — I10 Essential (primary) hypertension: Secondary | ICD-10-CM

## 2018-11-12 DIAGNOSIS — M810 Age-related osteoporosis without current pathological fracture: Secondary | ICD-10-CM | POA: Diagnosis not present

## 2018-11-12 DIAGNOSIS — N183 Chronic kidney disease, stage 3 unspecified: Secondary | ICD-10-CM | POA: Diagnosis not present

## 2018-11-12 NOTE — Progress Notes (Signed)
This encounter was created in error - please disregard.

## 2018-11-12 NOTE — Patient Outreach (Signed)
[facility]) Care Management  11/12/2018  AVY SIMONYAN 1928/05/05 [id number]                                       Medication Assistance Referral  Referral From: [facility] R[facility] Jenne Pane [facility] R[facility])  Medication/Company: [employer] / [employer] Safety Net Patient application portion:  Mailed Provider application portion: Faxed  to Dr. Johnnye Lana Provider address/fax verified via: Office website  Follow up:  Will follow up with patient in 7-10 business days to confirm application(s) have been received.  Maud Deed Chana Bode Blue Certified Pharmacy Technician [place] Management Direct [phone]

## 2018-11-12 NOTE — Addendum Note (Signed)
Addended by: Lynne Logan on: 11/12/2018 11:56 AM   Modules accepted: Level of Service, SmartSet

## 2018-11-12 NOTE — Chronic Care Management (AMB) (Signed)
  Chronic Care Management   Outreach Note  11/12/2018 Name: Whitney Henderson MRN: [id number] DOB: 1928/11/16  Referred by: [name], MD Reason for referral : Chronic Care Management (CCM RNCM Telephone Follow up )   An unsuccessful telephone outreach was attempted today. The patient was referred to the case management team by [name] MD for assistance with care management and care coordination.   Follow Up Plan: Telephone follow up appointment with care management team member scheduled for: 11/25/18  Barb Merino, RN, BSN, CCM Care Management Coordinator [place] [facility]  Direct Phone: [phone]

## 2018-11-13 ENCOUNTER — Ambulatory Visit: Payer: Self-pay

## 2018-11-13 DIAGNOSIS — I10 Essential (primary) hypertension: Secondary | ICD-10-CM

## 2018-11-13 DIAGNOSIS — N183 Chronic kidney disease, stage 3 unspecified: Secondary | ICD-10-CM

## 2018-11-13 NOTE — Chronic Care Management (AMB) (Signed)
Chronic Care Management   Social Work Follow Up Note  11/13/2018 Name: Whitney Henderson MRN: [id number] DOB: [birth year]  Whitney Henderson is a 85 y.o. year old female who is a primary care patient of [name], MD. The CCM team was consulted for assistance with care coordination.   Review of patient status, including review of consultants reports, other relevant assessments, and collaboration with appropriate care team members and the patient's provider was performed as part of comprehensive patient evaluation and provision of chronic care management services.    SW placed an outbound call to the patients daughter, Whitney Henderson, in response to a recent voice message requesting assistance with PT orders. See care plan below  Outpatient Encounter Medications as of 11/13/2018  Medication Sig  . Alpha-D-Galactosidase (BEANO MELTAWAYS) TABS Take 1 tablet by mouth daily as needed (gas).   [name] amLODipine (NORVASC) 2.5 MG tablet Take 1 tablet (2.5 mg total) by mouth 2 (two) times daily.  . ASPIRIN 81 PO Take 81 mg by mouth as needed (for pain or headache).   . betamethasone dipropionate (DIPROLENE) 0.05 % cream APPLY THIN LAYER EXTERNALLY TO THE AFFECTED AREA EVERY DAY AS NEEDED  . cholecalciferol (VITAMIN D) 1000 units tablet Take 1,000 Units by mouth daily.   . Ginkgo Biloba 120 MG CAPS Take 1 capsule by mouth daily at 12 noon.  . Multiple Vitamins-Minerals (CENTRUM SILVER PO) Take by mouth.  . Multiple Vitamins-Minerals (PRESERVISION AREDS 2 PO) Take 1 tablet by mouth daily.   [name] Propylene Glycol (SYSTANE BALANCE OP) Place 1 drop into both eyes daily as needed (for itchy eyes). Uses when needed.   . sertraline (ZOLOFT) 25 MG tablet Take 1 tablet (25 mg total) by mouth daily.  . Theanine 100 MG CAPS Take 1 capsule by mouth daily at 12 noon.  . vitamin C (ASCORBIC ACID) 500 MG tablet Take 500 mg by mouth daily.   No facility-administered encounter medications on file as of 11/13/2018.      Goals  Addressed            This Visit's Progress     Patient Stated   . "I have a lot pain in my back, hips and legs" (pt-stated)       Current Barriers:  [name] Knowledge Deficits related to Self Health management for Osteoarthritis . Impaired Physical Mobility  . Poor gait/balance . Risk for self care deficit  . Risk for falls  Nurse Case Manager Clinical Goal(s):  [name] Over the next 30 days, patient will verbalize understanding of plan for in home PT . Over the next 90 days, patient will verbalize improved physical mobility and improved Self Health management of pain related to Osteoarthritis    CCM SW Interventions: Completed 11/13/2018 with daughter Whitney Henderson . Outbound call to [facility] in response to recent voice message requesting a call back . Determined the patient has yet to begin PT services in the home . Chart review performed to note the patients referral was sent to [facility] . Outbound call to [facility], spoke with Whitney Henderson whom reports turning the referral down due to staffing shortage . Outbound call to [facility] to inform reason for delay . Advised Whitney Henderson SW would coordinate with provider office to send referral to alternative agency - Whitney Henderson stated understanding . Communication with Dr. Baird Cancer whom reports notifying referral coordinator of above intervention . Communication with Whitney Merino RN Case Manager regarding today's intervention   Patient Self Care Activities:  .  Attends all scheduled provider appointments . Calls pharmacy for medication refills . Attends church or other social activities . Performs ADL's independently . Performs IADL's independently . Calls provider office for new concerns or questions  Please see past updates related to this goal by clicking on the "Past Updates" button in the selected goal      . COMPLETED: "I need alternative transportation options for when my daughter can't drive me" (pt-stated)       Current Barriers:  .  Unable to provide own transportation . Limited interest in accessing [employer] services due to ride share program . Limited knowledge of community resource: Music therapist Social Work Clinical Goal(s):  [name] Over the next 45 days, client will work with SW to address concerns related to transportation barriers.  CCM SW Interventions: Completed 11/13/2018 . Outbound call placed to the patients daughter, Whitney Henderson, in response to recent voice message received . Confirmed receipt of mailed resources for transportation . Determined the patient is not yet interested in accessing [employer] . Goal Closed  Patient Self Care Activities:  . Currently UNABLE TO independently provide own transportation  Please see past updates related to this goal by clicking on the "Past Updates" button in the selected goal          Follow Up Plan: A member of the CM team will follow up with the patient over the next week.  Whitney Henderson, BSW, CDP Social Worker, Certified Dementia Practitioner [place] [facility] [place] [facility] [phone]  Total time spent performing care coordination and/or care management activities with the patient by phone or face to face = 13 minutes.

## 2018-11-13 NOTE — Patient Instructions (Signed)
Social Worker Visit Information  Goals we discussed today:  Goals Addressed            This Visit's Progress     Patient Stated   . "I have a lot pain in my back, hips and legs" (pt-stated)       Current Barriers:  [name] Knowledge Deficits related to Self Health management for Osteoarthritis . Impaired Physical Mobility  . Poor gait/balance . Risk for self care deficit  . Risk for falls  Nurse Case Manager Clinical Goal(s):  [name] Over the next 30 days, patient will verbalize understanding of plan for in home PT . Over the next 90 days, patient will verbalize improved physical mobility and improved Self Health management of pain related to Osteoarthritis    CCM SW Interventions: Completed 11/13/2018 with daughter [name] . Outbound call to [facility] in response to recent voice message requesting a call back . Determined the patient has yet to begin PT services in the home . Chart review performed to note the patients referral was sent to [facility] . Outbound call to [facility], spoke with Bennett Scrape whom reports turning the referral down due to staffing shortage . Outbound call to [facility] to inform reason for delay . Advised [name] SW would coordinate with provider office to send referral to alternative agency - [name] stated understanding . Communication with Dr. Baird Cancer whom reports notifying referral coordinator of above intervention . Communication with Barb Merino RN Case Manager regarding today's intervention   Patient Self Care Activities:  . Attends all scheduled provider appointments . Calls pharmacy for medication refills . Attends church or other social activities . Performs ADL's independently . Performs IADL's independently . Calls provider office for new concerns or questions  Please see past updates related to this goal by clicking on the "Past Updates" button in the selected goal      . COMPLETED: "I need alternative transportation options for when my  daughter can't drive me" (pt-stated)       Current Barriers:  . Unable to provide own transportation . Limited interest in accessing [employer] services due to ride share program . Limited knowledge of community resource: Music therapist Social Work Clinical Goal(s):  [name] Over the next 45 days, client will work with SW to address concerns related to transportation barriers.  CCM SW Interventions: Completed 11/13/2018 . Outbound call placed to the patients daughter, [name], in response to recent voice message received . Confirmed receipt of mailed resources for transportation . Determined the patient is not yet interested in accessing [employer] . Goal Closed  Patient Self Care Activities:  . Currently UNABLE TO independently provide own transportation  Please see past updates related to this goal by clicking on the "Past Updates" button in the selected goal          Follow Up Plan: A member of the CM team will follow up with the patient over the next week.   Daneen Schick, BSW, CDP Social Worker, Certified Dementia Practitioner [place] [facility] [place] [facility] [phone]

## 2018-11-16 ENCOUNTER — Telehealth: Payer: Self-pay

## 2018-11-18 ENCOUNTER — Ambulatory Visit: Payer: Self-pay | Admitting: Pharmacist

## 2018-11-18 NOTE — Progress Notes (Signed)
  Chronic Care Management   Outreach Note  11/18/2018 Name: Whitney Henderson MRN: [id number] DOB: 09/06/1928  Referred by: [name], MD Reason for referral : Chronic Care Management   An unsuccessful telephone outreach was attempted today. The patient was referred to the case management team by for assistance with care management and care coordination.   Follow Up Plan: A HIPPA compliant phone message was left for the patient's daughter providing contact information and requesting a return call.  The care management team will reach out to the patient again over the next 7 days.   SIGNATURE Regina Eck, PharmD, BCPS Clinical Pharmacist, [facility] [place]: [phone]

## 2018-11-24 ENCOUNTER — Telehealth: Payer: Self-pay

## 2018-11-25 ENCOUNTER — Telehealth: Payer: Self-pay

## 2018-11-30 ENCOUNTER — Ambulatory Visit: Payer: Self-pay | Admitting: Pharmacist

## 2018-11-30 DIAGNOSIS — I10 Essential (primary) hypertension: Secondary | ICD-10-CM

## 2018-11-30 DIAGNOSIS — M810 Age-related osteoporosis without current pathological fracture: Secondary | ICD-10-CM

## 2018-12-01 ENCOUNTER — Ambulatory Visit: Payer: Medicare Other

## 2018-12-01 ENCOUNTER — Other Ambulatory Visit: Payer: Self-pay

## 2018-12-01 ENCOUNTER — Ambulatory Visit (INDEPENDENT_AMBULATORY_CARE_PROVIDER_SITE_OTHER): Payer: Medicare Other

## 2018-12-01 VITALS — BP 150/76 | HR 64 | Temp 98.2°F | Ht 60.8 in | Wt 126.6 lb

## 2018-12-01 DIAGNOSIS — Z0000 Encounter for general adult medical examination without abnormal findings: Secondary | ICD-10-CM

## 2018-12-01 NOTE — Progress Notes (Signed)
Subjective:   Whitney Henderson is a 85 y.o. female who presents for Medicare Annual (Subsequent) preventive examination.  Review of Systems:  n/a Cardiac Risk Factors include: advanced age (>12men, >20 women);hypertension     Objective:     Vitals: BP (!) 150/76 (BP Location: Left Arm, Patient Position: Sitting, Cuff Size: Normal)   Pulse 64   Temp 98.2 F (36.8 C) (Oral)   Ht 5' 0.8" (1.544 m)   Wt 126 lb 9.6 oz (57.4 kg)   BMI 24.08 kg/m   Body mass index is 24.08 kg/m.  Advanced Directives 12/01/2018 10/13/2018 02/22/2018 10/30/2017 11/14/2016 04/27/2015  Does Patient Have a Medical Advance Directive? Yes Yes No Yes No No  Type of Paramedic of [place];Living will Healthcare Power of [place];Living will - -  Does patient want to make changes to medical advance directive? - No - Patient declined - No - Patient declined - -  Copy of Gem in Chart? No - copy requested No - copy requested - No - copy requested - -  Would patient like information on creating a medical advance directive? - - No - Patient declined - No - Patient declined Yes - Scientist, clinical (histocompatibility and immunogenetics) given    Tobacco Social History   Tobacco Use  Smoking Status Former Smoker  . Years: 2.00  Smokeless Tobacco Never Used     Counseling given: Not Answered   Clinical Intake:  Pre-visit preparation completed: Yes  Pain : No/denies pain     Nutritional Status: BMI of 19-24  Normal Nutritional Risks: None Diabetes: No  How often do you need to have someone help you when you read instructions, pamphlets, or other written materials from your doctor or pharmacy?: 1 - Never What is the last grade level you completed in school?: university  Interpreter Needed?: No  Information entered by :: NAllen LPN  Past Medical History:  Diagnosis Date  . Anxiety   . Cataracts, bilateral   . Headache 04/27/2015   Left>right periorbital  .  Hypertension   . Memory difficulties 09/04/2016   Past Surgical History:  Procedure Laterality Date  . ABDOMINAL HYSTERECTOMY    . BREAST EXCISIONAL BIOPSY Left over 66 ys ago   benign  . CHOLECYSTECTOMY    . EXCISIONAL HEMORRHOIDECTOMY     Family History  Problem Relation Age of Onset  . Cancer Father   . Breast cancer Daughter        64s   Social History   Socioeconomic History  . Marital status: Widowed    Spouse name: Izell Innsbrook   . Number of children: 4  . Years of education: Education officer, environmental  . Highest education level: Not on file  Occupational History  . Not on file  Social Needs  . Financial resource strain: Not hard at all  . Food insecurity    Worry: Never true    Inability: Never true  . Transportation needs    Medical: No    Non-medical: No  Tobacco Use  . Smoking status: Former Smoker    Years: 2.00  . Smokeless tobacco: Never Used  Substance and Sexual Activity  . Alcohol use: No  . Drug use: No  . Sexual activity: Not Currently  Lifestyle  . Physical activity    Days per week: 3 days    Minutes per session: 20 min  . Stress: Only a little  Relationships  . Social connections  Talks on phone: Not on file    Gets together: Not on file    Attends religious service: Not on file    Active member of club or organization: Not on file    Attends meetings of clubs or organizations: Not on file    Relationship status: Not on file  Other Topics Concern  . Not on file  Social History Narrative   Live with husband,  4 children.    Caffeine: 1 cup daily     Outpatient Encounter Medications as of 12/01/2018  Medication Sig  . Alpha-D-Galactosidase (BEANO MELTAWAYS) TABS Take 1 tablet by mouth daily as needed (gas).   [name] amLODipine (NORVASC) 2.5 MG tablet Take 1 tablet (2.5 mg total) by mouth 2 (two) times daily.  . ASPIRIN 81 PO Take 81 mg by mouth as needed (for pain or headache).   . betamethasone dipropionate (DIPROLENE) 0.05 % cream APPLY THIN LAYER  EXTERNALLY TO THE AFFECTED AREA EVERY DAY AS NEEDED  . cholecalciferol (VITAMIN D) 1000 units tablet Take 1,000 Units by mouth daily.   . Ginkgo Biloba 120 MG CAPS Take 1 capsule by mouth daily at 12 noon.  . Multiple Vitamins-Minerals (CENTRUM SILVER PO) Take by mouth.  . Multiple Vitamins-Minerals (PRESERVISION AREDS 2 PO) Take 1 tablet by mouth daily.   [name] Propylene Glycol (SYSTANE BALANCE OP) Place 1 drop into both eyes daily as needed (for itchy eyes). Uses when needed.   . sertraline (ZOLOFT) 25 MG tablet Take 1 tablet (25 mg total) by mouth daily.  . Theanine 100 MG CAPS Take 1 capsule by mouth daily at 12 noon.  . vitamin C (ASCORBIC ACID) 500 MG tablet Take 500 mg by mouth daily.   No facility-administered encounter medications on file as of 12/01/2018.     Activities of Daily Living In your present state of health, do you have any difficulty performing the following activities: 12/01/2018 11/05/2018  Hearing? N N  Vision? N N  Difficulty concentrating or making decisions? N N  Walking or climbing stairs? N N  Dressing or bathing? N N  Doing errands, shopping? N N  Preparing Food and eating ? N -  Using the Toilet? N -  In the past six months, have you accidently leaked urine? N -  Do you have problems with loss of bowel control? N -  Managing your Medications? N -  Managing your Finances? N -  Housekeeping or managing your Housekeeping? N -  Some recent data might be hidden    Patient Care Team: [name], MD as PCP - General (Internal Medicine) Monna Fam, MD as Consulting Physician (Ophthalmology) Rex Kras, Claudette Stapler, RN as Case Manager Daneen Schick as Social Worker [place], Royce Macadamia, [facility] (Pharmacist) Adaline Sill, CPhT as [place] [facility] (Pharmacy Technician)    Assessment:   This is a routine wellness examination for Whitney Henderson.  Exercise Activities and Dietary recommendations Current Exercise Habits: Home exercise routine, Type  of exercise: walking, Time (Minutes): 15, Frequency (Times/Week): 3, Weekly Exercise (Minutes/Week): 45  Goals    . "I have a lot pain in my back, hips and legs" (pt-stated)     Current Barriers:  [name] Knowledge Deficits related to Self Health management for Osteoarthritis . Impaired Physical Mobility  . Poor gait/balance . Risk for self care deficit  . Risk for falls  Nurse Case Manager Clinical Goal(s):  [name] Over the next 30 days, patient will verbalize understanding of plan for in home PT .  Over the next 90 days, patient will verbalize improved physical mobility and improved Self Health management of pain related to Osteoarthritis    CCM SW Interventions: Completed 11/13/2018 with daughter [name] . Outbound call to [facility] in response to recent voice message requesting a call back . Determined the patient has yet to begin PT services in the home . Chart review performed to note the patients referral was sent to [facility] . Outbound call to [facility], spoke with Bennett Scrape whom reports turning the referral down due to staffing shortage . Outbound call to [facility] to inform reason for delay . Advised [name] SW would coordinate with provider office to send referral to alternative agency - [name] stated understanding . Communication with Dr. Baird Cancer whom reports notifying referral coordinator of above intervention . Communication with Barb Merino RN Case Manager regarding today's intervention  CCM RN CM Interventions:  10/26/18 call completed with patient and daughter [name]  . Evaluation of current treatment plan related to Osteoarthritis and patient's adherence to plan as established by provider. . Provided education to patient re: the benefits of Physical Therapy to help develop a safe and effective HEP to help improve physical mobility, ROM, gait, strengthening and overall stamina; assessed for falls; assessed for DME needs and or home safety concerns - patient/daughter denies  . Collaborated with Dr. Baird Cancer regarding referral for in home PT . Discussed plans with patient for ongoing care management follow up and provided patient with direct contact information for care management team  Patient Self Care Activities:  . Attends all scheduled provider appointments . Calls pharmacy for medication refills . Attends church or other social activities . Performs ADL's independently . Performs IADL's independently . Calls provider office for new concerns or questions  Please see past updates related to this goal by clicking on the "Past Updates" button in the selected goal      . "I would like to learn more about nutrition" (pt-stated)     Current Barriers:  [name] Knowledge Deficits related to nutritional requirements for the elderly . Chronic Renal Disease Stage III  Nurse Case Manager Clinical Goal(s):  [name] Over the next 60 days, patient will work with the CCM team to address needs related to nutrition and hydration  CCM RN CM Interventions:  10/26/18 call completed with patient  . Evaluation of current treatment plan related to current nutritional/caloric intake and patient's adherence to plan as established by provider. . Provided education to patient re: the importance of staying well hydrated and eating a well balanced diet; discussed age related challenges; discussed healthy food choices for the elderly . Discussed plans with patient for ongoing care management follow up and provided patient with direct contact information for care management team . Provided patient with printed educational materials related to choosing healthy meals;  Elderly nutrition 101, Chronic Kidney Disease  Patient Self Care Activities:  . Self administers medications as prescribed . Attends all scheduled provider appointments . Calls pharmacy for medication refills . Attends church or other social activities . Performs ADL's independently . Performs IADL's independently . Calls  provider office for new concerns or questions   Initial goal documentation      . "My Prolia is too expensive & I need a medication for my osteoporosis (pt-stated)     Current Barriers:  . Inability to pay high monthly co-pays  Clinical Social Work Clinical Goal(s):  [name] Over the next 45 days the patient will work with embedded PharmD to  address problems related to medication costs.  CCM RN CM Interventions: 10/26/18 call completed with patient and daughter [name]  . Outbound call to the patient and daughter [name]; discussed need for financial support for cost of "Prolia $319 or so from [facility]" . Advised the patient to expect a call from embedded PharmD Lottie Dawson to assist with medication cost concerns . Collaboration with [name] via in basket message regarding patient referral  CCM PharmD Interventions: 11/02/18 call completed with patient and daughter [name] . Prolia is drug of choice for osteoporosis in this 85 year old female given T-score of -2.8 and decreased renal function (CrCl ~20-18mL/min).  FRAX score also calculated and patient could definitely benefit from therapy. . Patient optimized on calcium/vitD . Patient referral placed for physical therapy. . Will attempt [employer] patient assistance for Prolia injection (injection administered every 6 months).  [employer] safety net may also be able to assist.  Will mail daughter information. . Message routed to PCP and CCM RN CM to discuss case  Patient Self Care Activities:  . Attends all scheduled provider appointments . Calls pharmacy for medication refills . Performs ADL's independently  Please see past updates related to this goal by clicking on the "Past Updates" button in the selected goal      . Assist with Chronic Case Management by assessing patients ability to independently manage chronic conditions     Current Barriers:  [name] Knowledge barriers related to the patients ability to manage Chronic conditions including HTN  and CKD III  Clinical Social Work Clinical Goal(s):  [name] Over the next 30 days the patient will work with CCM RN Case Manager to establish an individualized care plan related to the management of patients identified chronic conditions  CCM SW Interventions: Completed 10/13/2018 . Patient interviewed and appropriate assessments performed . Assessed for the patients ability to verbalize current chronic medical conditions. The patient identified current conditioned as high blood pressure and stage three kidney disease . Determined the patient lacks education on how to best manage kidney disease especially related to appropriate diet . Advised the patient RN Case Manager would provide education surrounding diet restrictions during outreach calls . Scheduled an inititial call with RN Case Manager for October 5th . Advised the patient to gather all medications both over the counter and prescribed to go over with RN during scheduled call . Collaboration with RN Case Manager regarding patient concern for disease management related to diet  Patient Self Care Activities:  . Currently UNABLE TO independently manage chronic conditions  Initial goal documentation     . Patient Stated     12/01/2018, wants to keep up with health       Fall Risk Fall Risk  12/01/2018 11/05/2018 10/13/2018 05/26/2018 03/03/2018  Falls in the past year? 0 0 0 0 0  Number falls in past yr: 0 - - - -  Risk for fall due to : Medication side effect - - - -  Follow up Falls evaluation completed;Education provided;Falls prevention discussed - - - -   Is the patient's home free of loose throw rugs in walkways, pet beds, electrical cords, etc?   yes      Grab bars in the bathroom? no      Handrails on the stairs?   n/a      Adequate lighting?   yes  Timed Get Up and Go performed: n/a  Depression Screen PHQ 2/9 Scores 12/01/2018 10/13/2018 05/26/2018 02/09/2018  PHQ - 2 Score 1  1 0 6  PHQ- 9 Score 2 2 - 8     Cognitive  Function MMSE - Mini Mental State Exam 09/04/2016  Orientation to time 4  Orientation to Place 5  Registration 3  Attention/ Calculation 5  Recall 0  Language- name 2 objects 2  Language- repeat 1  Language- follow 3 step command 3  Language- read & follow direction 1  Write a sentence 1  Copy design 1  Total score 26     6CIT Screen 12/01/2018 10/30/2017  What Year? 0 points 0 points  What month? 0 points 0 points  What time? 0 points 0 points  Count back from 20 0 points 0 points  Months in reverse 4 points 2 points  Repeat phrase 10 points 2 points  Total Score 14 4    Immunization History  Administered Date(s) Administered  . Fluad Quad(high Dose 65+) 09/25/2018  . Influenza-Unspecified 10/22/2017  . Pneumococcal Conjugate-13 11/28/2015  . Pneumococcal Polysaccharide-23 12/04/2016  . Zoster Recombinat (Shingrix) 09/25/2018    Qualifies for Shingles Vaccine? yes  Screening Tests Health Maintenance  Topic Date Due  . INFLUENZA VACCINE  04/21/2019 (Originally 08/22/2018)  . TETANUS/TDAP  12/01/2019 (Originally 03/13/1947)  . DEXA SCAN  Completed  . PNA vac Low Risk Adult  Completed    Cancer Screenings: Lung: Low Dose CT Chest recommended if Age 78-80 years, 30 pack-year currently smoking OR have quit w/in 15years. Patient does not qualify. Breast:  Up to date on Mammogram? Yes   Up to date of Bone Density/Dexa? Yes Colorectal: not required  Additional Screenings: : Hepatitis C Screening: n/a     Plan:    patient wants to keep up with her health.   I have personally reviewed and noted the following in the patient's chart:   . Medical and social history . Use of alcohol, tobacco or illicit drugs  . Current medications and supplements . Functional ability and status . Nutritional status . Physical activity . Advanced directives . List of other physicians . Hospitalizations, surgeries, and ER visits in previous 12 months . Vitals . Screenings to  include cognitive, depression, and falls . Referrals and appointments  In addition, I have reviewed and discussed with patient certain preventive protocols, quality metrics, and best practice recommendations. A written personalized care plan for preventive services as well as general preventive health recommendations were provided to patient.     Kellie Simmering, LPN  [id number]

## 2018-12-01 NOTE — Patient Instructions (Signed)
Whitney Henderson , Thank you for taking time to come for your Medicare Wellness Visit. I appreciate your ongoing commitment to your health goals. Please review the following plan we discussed and let me know if I can assist you in the future.   Screening recommendations/referrals: Colonoscopy: not required Mammogram: 10/2018 Bone Density: 07/2018 Recommended yearly ophthalmology/optometry visit for glaucoma screening and checkup Recommended yearly dental visit for hygiene and checkup  Vaccinations: Influenza vaccine: 09/2018 Pneumococcal vaccine: 11/2016 Tdap vaccine: declined Shingles vaccine: 09/2018    Advanced directives: Please bring a copy of your POA (Power of [place]) and/or Living Will to your next appointment.    Conditions/risks identified: Hypertension  Next appointment: 02/18/2019 at 10:15   Preventive Care 65 Years and Older, Female Preventive care refers to lifestyle choices and visits with your health care provider that can promote health and wellness. What does preventive care include?  A yearly physical exam. This is also called an annual well check.  Dental exams once or twice a year.  Routine eye exams. Ask your health care provider how often you should have your eyes checked.  Personal lifestyle choices, including:  Daily care of your teeth and gums.  Regular physical activity.  Eating a healthy diet.  Avoiding tobacco and drug use.  Limiting alcohol use.  Practicing safe sex.  Taking low-dose aspirin every day.  Taking vitamin and mineral supplements as recommended by your health care provider. What happens during an annual well check? The services and screenings done by your health care provider during your annual well check will depend on your age, overall health, lifestyle risk factors, and family history of disease. Counseling  Your health care provider may ask you questions about your:  Alcohol use.  Tobacco use.  Drug use.  Emotional  well-being.  Home and relationship well-being.  Sexual activity.  Eating habits.  History of falls.  Memory and ability to understand (cognition).  Work and work Statistician.  Reproductive health. Screening  You may have the following tests or measurements:  Height, weight, and BMI.  Blood pressure.  Lipid and cholesterol levels. These may be checked every 5 years, or more frequently if you are over 26 years old.  Skin check.  Lung cancer screening. You may have this screening every year starting at age 49 if you have a 30-pack-year history of smoking and currently smoke or have quit within the past 15 years.  Fecal occult blood test (FOBT) of the stool. You may have this test every year starting at age 60.  Flexible sigmoidoscopy or colonoscopy. You may have a sigmoidoscopy every 5 years or a colonoscopy every 10 years starting at age 10.  Hepatitis C blood test.  Hepatitis B blood test.  Sexually transmitted disease (STD) testing.  Diabetes screening. This is done by checking your blood sugar (glucose) after you have not eaten for a while (fasting). You may have this done every 1-3 years.  Bone density scan. This is done to screen for osteoporosis. You may have this done starting at age 23.  Mammogram. This may be done every 1-2 years. Talk to your health care provider about how often you should have regular mammograms. Talk with your health care provider about your test results, treatment options, and if necessary, the need for more tests. Vaccines  Your health care provider may recommend certain vaccines, such as:  Influenza vaccine. This is recommended every year.  Tetanus, diphtheria, and acellular pertussis (Tdap, Td) vaccine. You may need a Td  booster every 10 years.  Zoster vaccine. You may need this after age 53.  Pneumococcal 13-valent conjugate (PCV13) vaccine. One dose is recommended after age 27.  Pneumococcal polysaccharide (PPSV23) vaccine. One  dose is recommended after age 21. Talk to your health care provider about which screenings and vaccines you need and how often you need them. This information is not intended to replace advice given to you by your health care provider. Make sure you discuss any questions you have with your health care provider. Document Released: 02/03/2015 Document Revised: 09/27/2015 Document Reviewed: 11/08/2014 Elsevier Interactive Patient Education  2017 [place] Prevention in the Home Falls can cause injuries. They can happen to people of all ages. There are many things you can do to make your home safe and to help prevent falls. What can I do on the outside of my home?  Regularly fix the edges of walkways and driveways and fix any cracks.  Remove anything that might make you trip as you walk through a door, such as a raised step or threshold.  Trim any bushes or trees on the path to your home.  Use bright outdoor lighting.  Clear any walking paths of anything that might make someone trip, such as rocks or tools.  Regularly check to see if handrails are loose or broken. Make sure that both sides of any steps have handrails.  Any raised decks and porches should have guardrails on the edges.  Have any leaves, snow, or ice cleared regularly.  Use sand or salt on walking paths during winter.  Clean up any spills in your garage right away. This includes oil or grease spills. What can I do in the bathroom?  Use night lights.  Install grab bars by the toilet and in the tub and shower. Do not use towel bars as grab bars.  Use non-skid mats or decals in the tub or shower.  If you need to sit down in the shower, use a plastic, non-slip stool.  Keep the floor dry. Clean up any water that spills on the floor as soon as it happens.  Remove soap buildup in the tub or shower regularly.  Attach bath mats securely with double-sided non-slip rug tape.  Do not have throw rugs and other  things on the floor that can make you trip. What can I do in the bedroom?  Use night lights.  Make sure that you have a light by your bed that is easy to reach.  Do not use any sheets or blankets that are too big for your bed. They should not hang down onto the floor.  Have a firm chair that has side arms. You can use this for support while you get dressed.  Do not have throw rugs and other things on the floor that can make you trip. What can I do in the kitchen?  Clean up any spills right away.  Avoid walking on wet floors.  Keep items that you use a lot in easy-to-reach places.  If you need to reach something above you, use a strong step stool that has a grab bar.  Keep electrical cords out of the way.  Do not use floor polish or wax that makes floors slippery. If you must use wax, use non-skid floor wax.  Do not have throw rugs and other things on the floor that can make you trip. What can I do with my stairs?  Do not leave any items on the stairs.  Make sure that there are handrails on both sides of the stairs and use them. Fix handrails that are broken or loose. Make sure that handrails are as long as the stairways.  Check any carpeting to make sure that it is firmly attached to the stairs. Fix any carpet that is loose or worn.  Avoid having throw rugs at the top or bottom of the stairs. If you do have throw rugs, attach them to the floor with carpet tape.  Make sure that you have a light switch at the top of the stairs and the bottom of the stairs. If you do not have them, ask someone to add them for you. What else can I do to help prevent falls?  Wear shoes that:  Do not have high heels.  Have rubber bottoms.  Are comfortable and fit you well.  Are closed at the toe. Do not wear sandals.  If you use a stepladder:  Make sure that it is fully opened. Do not climb a closed stepladder.  Make sure that both sides of the stepladder are locked into place.  Ask  someone to hold it for you, if possible.  Clearly mark and make sure that you can see:  Any grab bars or handrails.  First and last steps.  Where the edge of each step is.  Use tools that help you move around (mobility aids) if they are needed. These include:  Canes.  Walkers.  Scooters.  Crutches.  Turn on the lights when you go into a dark area. Replace any light bulbs as soon as they burn out.  Set up your furniture so you have a clear path. Avoid moving your furniture around.  If any of your floors are uneven, fix them.  If there are any pets around you, be aware of where they are.  Review your medicines with your doctor. Some medicines can make you feel dizzy. This can increase your chance of falling. Ask your doctor what other things that you can do to help prevent falls. This information is not intended to replace advice given to you by your health care provider. Make sure you discuss any questions you have with your health care provider. Document Released: 11/03/2008 Document Revised: 06/15/2015 Document Reviewed: 02/11/2014 Elsevier Interactive Patient Education  2017 [employer].

## 2018-12-03 ENCOUNTER — Telehealth: Payer: Self-pay

## 2018-12-03 ENCOUNTER — Encounter: Payer: Medicare Other | Admitting: Internal Medicine

## 2018-12-03 ENCOUNTER — Ambulatory Visit: Payer: Medicare Other

## 2018-12-03 NOTE — Patient Instructions (Signed)
Visit Information  Goals Addressed            This Visit's Progress     Patient Stated   . "My Prolia is too expensive & I need a medication for my osteoporosis (pt-stated)       Current Barriers:  . Inability to pay high monthly co-pays  Clinical Social Work Clinical Goal(s):  [name] Over the next 45 days the patient will work with embedded PharmD to address problems related to medication costs.  CCM RN CM Interventions: 10/26/18 call completed with patient and daughter [name]  . Outbound call to the patient and daughter [name]; discussed need for financial support for cost of "Prolia $319 or so from [facility]" . Advised the patient to expect a call from embedded PharmD Lottie Dawson to assist with medication cost concerns . Collaboration with [name] via in basket message regarding patient referral  CCM PharmD Interventions: 11/30/18 call completed with patient and daughter [name] . Prolia is drug of choice for osteoporosis in this 85 year old female given T-score of -2.8 and decreased renal function (CrCl ~20-54mL/min).  FRAX score also calculated and patient would benefit from therapy. . Patient optimized on calcium/vitD supplements at this time (tolerating well) . Patient referral placed for physical therapy--PT starting this week . Will attempt [employer] patient assistance for Prolia injection (injection administered every 6 months).  Amgen safety net may also be able to assist.  Will mail daughter information. o Patient/daughter mailed back application, however was missing financials/copay amount.  Call placed to daughter to remind her what to include.  Will proceed with application once documents provided by patient/daughter. . Message routed to PCP and CCM RN CM to discuss case  Patient Self Care Activities:  . Attends all scheduled provider appointments . Calls pharmacy for medication refills . Performs ADL's independently  Please see past updates related to this goal by clicking  on the "Past Updates" button in the selected goal         The patient verbalized understanding of instructions provided today and declined a print copy of patient instruction materials.   The care management team will reach out to the patient again over the next 30 days.   SIGNATURE Regina Eck, PharmD, BCPS Clinical Pharmacist, [facility] [place]: [phone]

## 2018-12-03 NOTE — Progress Notes (Signed)
Chronic Care Management   Visit Note  11/30/2018 Name: STEPHENIE Henderson MRN: [id number] DOB: [birth year]  Referred by: [name], MD Reason for referral : Chronic Care Management   Whitney Henderson is a 85 y.o. year old female who is a primary care patient of [name], MD. The CCM team was consulted for assistance with chronic disease management and care coordination needs related to osteoporosis  Review of patient status, including review of consultants reports, relevant laboratory and other test results, and collaboration with appropriate care team members and the patient's provider was performed as part of comprehensive patient evaluation and provision of chronic care management services.    I spoke with Whitney Henderson's daughter, Whitney Henderson, by telephone today.  Medications: Outpatient Encounter Medications as of 11/30/2018  Medication Sig  . Alpha-D-Galactosidase (BEANO MELTAWAYS) TABS Take 1 tablet by mouth daily as needed (gas).   [name] amLODipine (NORVASC) 2.5 MG tablet Take 1 tablet (2.5 mg total) by mouth 2 (two) times daily.  . ASPIRIN 81 PO Take 81 mg by mouth as needed (for pain or headache).   . betamethasone dipropionate (DIPROLENE) 0.05 % cream APPLY THIN LAYER EXTERNALLY TO THE AFFECTED AREA EVERY DAY AS NEEDED  . cholecalciferol (VITAMIN D) 1000 units tablet Take 1,000 Units by mouth daily.   . Ginkgo Biloba 120 MG CAPS Take 1 capsule by mouth daily at 12 noon.  . Multiple Vitamins-Minerals (CENTRUM SILVER PO) Take by mouth.  . Multiple Vitamins-Minerals (PRESERVISION AREDS 2 PO) Take 1 tablet by mouth daily.   [name] Propylene Glycol (SYSTANE BALANCE OP) Place 1 drop into both eyes daily as needed (for itchy eyes). Uses when needed.   . sertraline (ZOLOFT) 25 MG tablet Take 1 tablet (25 mg total) by mouth daily.  . Theanine 100 MG CAPS Take 1 capsule by mouth daily at 12 noon.  . vitamin C (ASCORBIC ACID) 500 MG tablet Take 500 mg by mouth daily.   No facility-administered  encounter medications on file as of 11/30/2018.      Objective:   Goals Addressed            This Visit's Progress     Patient Stated   . "My Prolia is too expensive & I need a medication for my osteoporosis (pt-stated)       Current Barriers:  . Inability to pay high monthly co-pays  Clinical Social Work Clinical Goal(s):  [name] Over the next 45 days the patient will work with embedded PharmD to address problems related to medication costs.  CCM RN CM Interventions: 10/26/18 call completed with patient and daughter Whitney Henderson  . Outbound call to the patient and daughter Whitney Henderson; discussed need for financial support for cost of "Prolia $319 or so from [facility]" . Advised the patient to expect a call from embedded PharmD Lottie Dawson to assist with medication cost concerns . Collaboration with [name] via in basket message regarding patient referral  CCM PharmD Interventions: 11/30/18 call completed with patient and daughter Whitney Henderson . Prolia is drug of choice for osteoporosis in this 85 year old female given T-score of -2.8 and decreased renal function (CrCl ~20-27mL/min).  FRAX score also calculated and patient would benefit from therapy. . Patient optimized on calcium/vitD supplements at this time (tolerating well) . Patient referral placed for physical therapy--PT starting this week . Will attempt [employer] patient assistance for Prolia injection (injection administered every 6 months).  Amgen safety net may also be able to assist.  Will mail daughter information.  o Patient/daughter mailed back application, however was missing financials/copay amount.  Call placed to daughter to remind her what to include.  Will proceed with application once documents provided by patient/daughter. . Message routed to PCP and CCM RN CM to discuss case  Patient Self Care Activities:  . Attends all scheduled provider appointments . Calls pharmacy for medication refills . Performs ADL's independently  Please  see past updates related to this goal by clicking on the "Past Updates" button in the selected goal          Plan:   The care management team will reach out to the patient again over the next 30 days.   Provider Signature Regina Eck, PharmD, BCPS Clinical Pharmacist, [facility] [place]: [phone]

## 2018-12-08 DIAGNOSIS — R269 Unspecified abnormalities of gait and mobility: Secondary | ICD-10-CM | POA: Diagnosis not present

## 2018-12-08 DIAGNOSIS — F411 Generalized anxiety disorder: Secondary | ICD-10-CM

## 2018-12-08 DIAGNOSIS — M6281 Muscle weakness (generalized): Secondary | ICD-10-CM | POA: Diagnosis not present

## 2018-12-08 DIAGNOSIS — I10 Essential (primary) hypertension: Secondary | ICD-10-CM | POA: Diagnosis not present

## 2018-12-08 DIAGNOSIS — M546 Pain in thoracic spine: Secondary | ICD-10-CM | POA: Diagnosis not present

## 2018-12-15 ENCOUNTER — Ambulatory Visit (INDEPENDENT_AMBULATORY_CARE_PROVIDER_SITE_OTHER): Payer: Medicare Other

## 2018-12-15 DIAGNOSIS — I10 Essential (primary) hypertension: Secondary | ICD-10-CM | POA: Diagnosis not present

## 2018-12-15 DIAGNOSIS — M810 Age-related osteoporosis without current pathological fracture: Secondary | ICD-10-CM

## 2018-12-15 DIAGNOSIS — N183 Chronic kidney disease, stage 3 unspecified: Secondary | ICD-10-CM | POA: Diagnosis not present

## 2018-12-16 ENCOUNTER — Ambulatory Visit: Payer: Self-pay

## 2018-12-16 DIAGNOSIS — N183 Chronic kidney disease, stage 3 unspecified: Secondary | ICD-10-CM

## 2018-12-16 DIAGNOSIS — M810 Age-related osteoporosis without current pathological fracture: Secondary | ICD-10-CM

## 2018-12-16 DIAGNOSIS — I10 Essential (primary) hypertension: Secondary | ICD-10-CM

## 2018-12-16 DIAGNOSIS — M546 Pain in thoracic spine: Secondary | ICD-10-CM | POA: Diagnosis not present

## 2018-12-16 DIAGNOSIS — R269 Unspecified abnormalities of gait and mobility: Secondary | ICD-10-CM | POA: Diagnosis not present

## 2018-12-16 DIAGNOSIS — M6281 Muscle weakness (generalized): Secondary | ICD-10-CM | POA: Diagnosis not present

## 2018-12-16 NOTE — Chronic Care Management (AMB) (Signed)
  Chronic Care Management   Outreach Note  12/16/2018 Name: [name] MRN: [id number] DOB: 05-18-1928  Referred by: [name], MD Reason for referral : Chronic Care Management (CCM RNCM Telephone Follow up )  Inbound call and voice message received from patient's daughter Anabel Midgette. Attempted unsuccessful return call to both Ms. [place] and daughter [name]. The patient was referred to the case management team by [name] MD for assistance with care management and care coordination.   Follow Up Plan: A HIPPA compliant phone message was left for the patient providing contact information and requesting a return call.  Telephone follow up appointment with care management team member scheduled for: 12/25/18  Barb Merino, RN, BSN, CCM Care Management Coordinator [place] [facility]  Direct Phone: [phone]

## 2018-12-16 NOTE — Patient Instructions (Signed)
Visit Information  Goals Addressed            This Visit's Progress     Patient Stated   . "I have a lot pain in my back, hips and legs" (pt-stated)       Current Barriers:  [name] Knowledge Deficits related to Self Health management for Osteoarthritis . Impaired Physical Mobility  . Poor gait/balance . Risk for self care deficit  . Risk for falls  Nurse Case Manager Clinical Goal(s):  [name] Over the next 30 days, patient will verbalize understanding of plan for in home PT . Over the next 90 days, patient will verbalize improved physical mobility and improved Self Health management of pain related to Osteoarthritis  .  CCM RN CM Interventions:  12/15/18 call completed with patient and daughter [name]  Confirmed patient has started in home PT with one visit completed and a second visit scheduled for 01/15/19 . Evaluation of current treatment plan related to Osteoarthritis and patient's adherence to plan as established by provider. . Advised patient to follow PT recommendations for a HEP; educated patient on the importance of full participation while working with in home PT as tolerated for best results; discussed the importance of completing the HEP as instructed for safety and effectiveness and to reduce the risk of joint injury and or dislocation due to having severe Osteoarthritis . Discussed plans with patient for ongoing care management follow up and provided patient with direct contact information for care management team  Patient Self Care Activities:  . Attends all scheduled provider appointments . Calls pharmacy for medication refills . Attends church or other social activities . Performs ADL's independently . Performs IADL's independently . Calls provider office for new concerns or questions  Please see past updates related to this goal by clicking on the "Past Updates" button in the selected goal      . "My Prolia is too expensive & I need a medication for my osteoporosis  (pt-stated)       Current Barriers:  . Inability to pay high monthly co-pays  Clinical Social Work Clinical Goal(s):  [name] Over the next 45 days the patient will work with embedded PharmD to address problems related to medication costs.  CCM RN CM Interventions: 12/15/18 call completed with patient and daughter [name]  . Evaluation of current treatment plan related to Osteoporosis and patient's adherence to plan as established by provider. . Reviewed medications with patient and discussed patient is concerned she received 2 medical bills for the prescription drug Prolia; discussed she has not started this medication due to cost and she also has concerns regarding the SE to the drug; patient is unable to provide the information regarding the billing for this medication, she requested I speak with her daughter [name]; Per [name] she will provide this RN with the billing information during her visit to her mother's home tomorrow and will give me a call back; discussed Ms. Moltz is fearful of the SE that may occur from Prolia; Discussed the application status for financial assistance with the cost of this medication; discussed [name] will provide Rhea Bleacher D a copy of her mother's SS income after the first of the year; Encouraged [name] to have the patient discuss her concerns about potential SE with Dr. Baird Cancer during her OV scheduled for 02/18/19 @10 :15 AM . Collaborated with embedded Pharm D Lottie Dawson regarding patient/daughter will not pursue financial assist for Prolia until after the first of the year; advised the patient will speak  to Dr. Baird Cancer during her 02/18/19 OV regarding the medical necessity for taking Prolia and the potential SE . Discussed plans with patient for ongoing care management follow up and provided patient with direct contact information for care management team   CCM PharmD Interventions: 11/30/18 call completed with patient and daughter [name] . Prolia is drug of choice for  osteoporosis in this 85 year old female given T-score of -2.8 and decreased renal function (CrCl ~20-30mL/min).  FRAX score also calculated and patient would benefit from therapy. . Patient optimized on calcium/vitD supplements at this time (tolerating well) . Patient referral placed for physical therapy--PT starting this week . Will attempt [employer] patient assistance for Prolia injection (injection administered every 6 months).  Amgen safety net may also be able to assist.  Will mail daughter information. o Patient/daughter mailed back application, however was missing financials/copay amount.  Call placed to daughter to remind her what to include.  Will proceed with application once documents provided by patient/daughter. . Message routed to PCP and CCM RN CM to discuss case  Patient Self Care Activities:  . Attends all scheduled provider appointments . Calls pharmacy for medication refills . Performs ADL's independently  Please see past updates related to this goal by clicking on the "Past Updates" button in the selected goal         The patient verbalized understanding of instructions provided today and declined a print copy of patient instruction materials.   Telephone follow up appointment with care management team member scheduled for: 02/19/19  Barb Merino, RN, BSN, CCM Care Management Coordinator [facility]  Direct Phone: [phone]

## 2018-12-16 NOTE — Chronic Care Management (AMB) (Signed)
Chronic Care Management   Follow Up Note   12/15/2018 Name: Whitney Henderson MRN: [id number] DOB: 04-24-1928  Referred by: [name], MD Reason for referral : Chronic Care Management (CCM RNCM Telephone Follow up )   Whitney Henderson is a 85 y.o. year old female who is a primary care patient of [name], MD. The CCM team was consulted for assistance with chronic disease management and care coordination needs.    Review of patient status, including review of consultants reports, relevant laboratory and other test results, and collaboration with appropriate care team members and the patient's provider was performed as part of comprehensive patient evaluation and provision of chronic care management services.    SDOH (Social Determinants of Health) screening performed today: None. See Care Plan for related entries.   Inbound call received from patient's daughter Whitney Henderson with questions regarding patients in home PT and Prolia.   Outpatient Encounter Medications as of 12/15/2018  Medication Sig  . Alpha-D-Galactosidase (BEANO MELTAWAYS) TABS Take 1 tablet by mouth daily as needed (gas).   [name] amLODipine (NORVASC) 2.5 MG tablet Take 1 tablet (2.5 mg total) by mouth 2 (two) times daily.  . ASPIRIN 81 PO Take 81 mg by mouth as needed (for pain or headache).   . betamethasone dipropionate (DIPROLENE) 0.05 % cream APPLY THIN LAYER EXTERNALLY TO THE AFFECTED AREA EVERY DAY AS NEEDED  . cholecalciferol (VITAMIN D) 1000 units tablet Take 1,000 Units by mouth daily.   . Ginkgo Biloba 120 MG CAPS Take 1 capsule by mouth daily at 12 noon.  . Multiple Vitamins-Minerals (CENTRUM SILVER PO) Take by mouth.  . Multiple Vitamins-Minerals (PRESERVISION AREDS 2 PO) Take 1 tablet by mouth daily.   [name] Propylene Glycol (SYSTANE BALANCE OP) Place 1 drop into both eyes daily as needed (for itchy eyes). Uses when needed.   . sertraline (ZOLOFT) 25 MG tablet Take 1 tablet (25 mg total) by mouth daily.  .  Theanine 100 MG CAPS Take 1 capsule by mouth daily at 12 noon.  . vitamin C (ASCORBIC ACID) 500 MG tablet Take 500 mg by mouth daily.   No facility-administered encounter medications on file as of 12/15/2018.      Goals Addressed      Patient Stated   . "I have a lot pain in my back, hips and legs" (pt-stated)       Current Barriers:  [name] Knowledge Deficits related to Self Health management for Osteoarthritis . Impaired Physical Mobility  . Poor gait/balance . Risk for self care deficit  . Risk for falls  Nurse Case Manager Clinical Goal(s):  [name] Over the next 30 days, patient will verbalize understanding of plan for in home PT . Over the next 90 days, patient will verbalize improved physical mobility and improved Self Health management of pain related to Osteoarthritis  .  CCM RN CM Interventions:  12/15/18 call completed with patient and daughter Whitney Henderson  Confirmed patient has started in home PT with one visit completed and a second visit scheduled for 01/15/19 . Evaluation of current treatment plan related to Osteoarthritis and patient's adherence to plan as established by provider. . Advised patient to follow PT recommendations for a HEP; educated patient on the importance of full participation while working with in home PT as tolerated for best results; discussed the importance of completing the HEP as instructed for safety and effectiveness and to reduce the risk of joint injury and or dislocation due to having severe Osteoarthritis . Discussed  plans with patient for ongoing care management follow up and provided patient with direct contact information for care management team  Patient Self Care Activities:  . Attends all scheduled provider appointments . Calls pharmacy for medication refills . Attends church or other social activities . Performs ADL's independently . Performs IADL's independently . Calls provider office for new concerns or questions  Please see past updates  related to this goal by clicking on the "Past Updates" button in the selected goal      . "My Prolia is too expensive & I need a medication for my osteoporosis (pt-stated)       Current Barriers:  . Inability to pay high monthly co-pays  Clinical Social Work Clinical Goal(s):  [name] Over the next 45 days the patient will work with embedded PharmD to address problems related to medication costs.  CCM RN CM Interventions: 12/15/18 call completed with patient and daughter Whitney Henderson  . Evaluation of current treatment plan related to Osteoporosis and patient's adherence to plan as established by provider. . Reviewed medications with patient and discussed patient is concerned she received 2 medical bills for the prescription drug Prolia; discussed she has not started this medication due to cost and she also has concerns regarding the SE to the drug; patient is unable to provide the information regarding the billing for this medication, she requested I speak with her daughter Whitney Henderson; Per Whitney Henderson she will provide this RN with the billing information during her visit to her mother's home tomorrow and will give me a call back; discussed Ms. Merkel is fearful of the SE that may occur from Prolia; Discussed the application status for financial assistance with the cost of this medication; discussed Whitney Henderson will provide Rhea Bleacher D a copy of her mother's SS income after the first of the year; Encouraged Whitney Henderson to have the patient discuss her concerns about potential SE with Dr. Baird Cancer during her OV scheduled for 02/18/19 @10 :15 AM . Collaborated with embedded Pharm D Lottie Dawson regarding patient/daughter will not pursue financial assist for Prolia until after the first of the year; advised the patient will speak to Dr. Baird Cancer during her 02/18/19 OV regarding the medical necessity for taking Prolia and the potential SE . Discussed plans with patient for ongoing care management follow up and provided patient with direct  contact information for care management team   CCM PharmD Interventions: 11/30/18 call completed with patient and daughter Whitney Henderson . Prolia is drug of choice for osteoporosis in this 85 year old female given T-score of -2.8 and decreased renal function (CrCl ~20-48mL/min).  FRAX score also calculated and patient would benefit from therapy. . Patient optimized on calcium/vitD supplements at this time (tolerating well) . Patient referral placed for physical therapy--PT starting this week . Will attempt [employer] patient assistance for Prolia injection (injection administered every 6 months).  [employer] safety net may also be able to assist.  Will mail daughter information. o Patient/daughter mailed back application, however was missing financials/copay amount.  Call placed to daughter to remind her what to include.  Will proceed with application once documents provided by patient/daughter. . Message routed to PCP and CCM RN CM to discuss case  Patient Self Care Activities:  . Attends all scheduled provider appointments . Calls pharmacy for medication refills . Performs ADL's independently  Please see past updates related to this goal by clicking on the "Past Updates" button in the selected goal          Telephone follow up  appointment with care management team member scheduled for: 02/19/19  Barb Merino, RN, BSN, CCM Care Management Coordinator [facility]  Direct Phone: [phone]

## 2018-12-21 DIAGNOSIS — M6281 Muscle weakness (generalized): Secondary | ICD-10-CM | POA: Diagnosis not present

## 2018-12-21 DIAGNOSIS — R269 Unspecified abnormalities of gait and mobility: Secondary | ICD-10-CM | POA: Diagnosis not present

## 2018-12-21 DIAGNOSIS — I10 Essential (primary) hypertension: Secondary | ICD-10-CM | POA: Diagnosis not present

## 2018-12-21 DIAGNOSIS — M546 Pain in thoracic spine: Secondary | ICD-10-CM | POA: Diagnosis not present

## 2018-12-25 ENCOUNTER — Telehealth: Payer: Self-pay

## 2018-12-25 DIAGNOSIS — I10 Essential (primary) hypertension: Secondary | ICD-10-CM | POA: Diagnosis not present

## 2018-12-25 DIAGNOSIS — R269 Unspecified abnormalities of gait and mobility: Secondary | ICD-10-CM | POA: Diagnosis not present

## 2018-12-25 DIAGNOSIS — M546 Pain in thoracic spine: Secondary | ICD-10-CM | POA: Diagnosis not present

## 2018-12-25 DIAGNOSIS — M6281 Muscle weakness (generalized): Secondary | ICD-10-CM | POA: Diagnosis not present

## 2018-12-29 ENCOUNTER — Telehealth: Payer: Self-pay

## 2018-12-29 DIAGNOSIS — N183 Chronic kidney disease, stage 3 unspecified: Secondary | ICD-10-CM | POA: Diagnosis not present

## 2018-12-29 DIAGNOSIS — I129 Hypertensive chronic kidney disease with stage 1 through stage 4 chronic kidney disease, or unspecified chronic kidney disease: Secondary | ICD-10-CM | POA: Diagnosis not present

## 2018-12-29 DIAGNOSIS — Z8739 Personal history of other diseases of the musculoskeletal system and connective tissue: Secondary | ICD-10-CM | POA: Diagnosis not present

## 2018-12-29 NOTE — Telephone Encounter (Signed)
-----   Message from [name], MD sent at 12/27/2018  4:46 PM EST ----- Pls call pt - does she have someone coming out to see her for physical therapy? What days of the week do they come?

## 2018-12-29 NOTE — Telephone Encounter (Signed)
PT DAUGHTER [name] RTND CALL STATED THAT PT DOES HAVE PHYSICAL THERAPY THAT COMES OUT 2X WEEK DAYS DIFFER. THERAPIST STATED THAT PT IS DOING GOOD SO MAYBE CHANGING TO 1X WEEK

## 2018-12-29 NOTE — Telephone Encounter (Signed)
I left the pt a message to call the office back. 

## 2019-01-01 DIAGNOSIS — M6281 Muscle weakness (generalized): Secondary | ICD-10-CM | POA: Diagnosis not present

## 2019-01-01 DIAGNOSIS — M546 Pain in thoracic spine: Secondary | ICD-10-CM | POA: Diagnosis not present

## 2019-01-01 DIAGNOSIS — I10 Essential (primary) hypertension: Secondary | ICD-10-CM | POA: Diagnosis not present

## 2019-01-01 DIAGNOSIS — R269 Unspecified abnormalities of gait and mobility: Secondary | ICD-10-CM | POA: Diagnosis not present

## 2019-01-01 NOTE — Telephone Encounter (Signed)
The Whitney Henderson's daughter velma returned the call to the office and said that the Whitney Henderson does have physical therapy that comes out 2 times a week the days differ, the therapist stated that the Whitney Henderson is doing good so they maybe changing to 1 time per week.

## 2019-01-08 DIAGNOSIS — M6281 Muscle weakness (generalized): Secondary | ICD-10-CM | POA: Diagnosis not present

## 2019-01-08 DIAGNOSIS — R269 Unspecified abnormalities of gait and mobility: Secondary | ICD-10-CM | POA: Diagnosis not present

## 2019-01-08 DIAGNOSIS — M546 Pain in thoracic spine: Secondary | ICD-10-CM | POA: Diagnosis not present

## 2019-01-08 DIAGNOSIS — I10 Essential (primary) hypertension: Secondary | ICD-10-CM | POA: Diagnosis not present

## 2019-01-13 ENCOUNTER — Telehealth: Payer: Self-pay

## 2019-01-27 DIAGNOSIS — I10 Essential (primary) hypertension: Secondary | ICD-10-CM | POA: Diagnosis not present

## 2019-01-27 DIAGNOSIS — M546 Pain in thoracic spine: Secondary | ICD-10-CM | POA: Diagnosis not present

## 2019-01-27 DIAGNOSIS — M6281 Muscle weakness (generalized): Secondary | ICD-10-CM | POA: Diagnosis not present

## 2019-01-27 DIAGNOSIS — R269 Unspecified abnormalities of gait and mobility: Secondary | ICD-10-CM | POA: Diagnosis not present

## 2019-01-28 ENCOUNTER — Telehealth: Payer: Self-pay

## 2019-01-28 NOTE — Telephone Encounter (Signed)
Pt daughter LVM asking if it would be ok for pt to receive the COVID vaccine  Spoke with pt per RS pt is ok to receive the vaccine

## 2019-02-18 ENCOUNTER — Other Ambulatory Visit: Payer: Self-pay

## 2019-02-18 ENCOUNTER — Ambulatory Visit (INDEPENDENT_AMBULATORY_CARE_PROVIDER_SITE_OTHER): Payer: Medicare Other | Admitting: Internal Medicine

## 2019-02-18 ENCOUNTER — Encounter: Payer: Self-pay | Admitting: Internal Medicine

## 2019-02-18 VITALS — BP 134/72 | HR 76 | Temp 97.8°F | Ht 61.4 in | Wt 127.6 lb

## 2019-02-18 DIAGNOSIS — R251 Tremor, unspecified: Secondary | ICD-10-CM

## 2019-02-18 DIAGNOSIS — R8290 Unspecified abnormal findings in urine: Secondary | ICD-10-CM

## 2019-02-18 DIAGNOSIS — N183 Chronic kidney disease, stage 3 unspecified: Secondary | ICD-10-CM

## 2019-02-18 DIAGNOSIS — F419 Anxiety disorder, unspecified: Secondary | ICD-10-CM

## 2019-02-18 DIAGNOSIS — R413 Other amnesia: Secondary | ICD-10-CM | POA: Diagnosis not present

## 2019-02-18 DIAGNOSIS — Z0000 Encounter for general adult medical examination without abnormal findings: Secondary | ICD-10-CM | POA: Diagnosis not present

## 2019-02-18 DIAGNOSIS — I129 Hypertensive chronic kidney disease with stage 1 through stage 4 chronic kidney disease, or unspecified chronic kidney disease: Secondary | ICD-10-CM | POA: Diagnosis not present

## 2019-02-18 DIAGNOSIS — I10 Essential (primary) hypertension: Secondary | ICD-10-CM

## 2019-02-18 LAB — POCT URINALYSIS DIPSTICK
Bilirubin, UA: NEGATIVE
Glucose, UA: NEGATIVE
Ketones, UA: NEGATIVE
Nitrite, UA: NEGATIVE
Protein, UA: NEGATIVE
Spec Grav, UA: 1.015 (ref 1.010–1.025)
Urobilinogen, UA: 0.2 E.U./dL
pH, UA: 7 (ref 5.0–8.0)

## 2019-02-18 LAB — POCT UA - MICROALBUMIN
Creatinine, POC: 50 mg/dL
Microalbumin Ur, POC: 10 mg/L

## 2019-02-18 NOTE — Patient Instructions (Signed)
Health Maintenance, Female Adopting a healthy lifestyle and getting preventive care are important in promoting health and wellness. Ask your health care provider about:  The right schedule for you to have regular tests and exams.  Things you can do on your own to prevent diseases and keep yourself healthy. What should I know about diet, weight, and exercise? Eat a healthy diet   Eat a diet that includes plenty of vegetables, fruits, low-fat dairy products, and lean protein.  Do not eat a lot of foods that are high in solid fats, added sugars, or sodium. Maintain a healthy weight Body mass index (BMI) is used to identify weight problems. It estimates body fat based on height and weight. Your health care provider can help determine your BMI and help you achieve or maintain a healthy weight. Get regular exercise Get regular exercise. This is one of the most important things you can do for your health. Most adults should:  Exercise for at least 150 minutes each week. The exercise should increase your heart rate and make you sweat (moderate-intensity exercise).  Do strengthening exercises at least twice a week. This is in addition to the moderate-intensity exercise.  Spend less time sitting. Even light physical activity can be beneficial. Watch cholesterol and blood lipids Have your blood tested for lipids and cholesterol at 84 years of age, then have this test every 5 years. Have your cholesterol levels checked more often if:  Your lipid or cholesterol levels are high.  You are older than 84 years of age.  You are at high risk for heart disease. What should I know about cancer screening? Depending on your health history and family history, you may need to have cancer screening at various ages. This may include screening for:  Breast cancer.  Cervical cancer.  Colorectal cancer.  Skin cancer.  Lung cancer. What should I know about heart disease, diabetes, and high blood  pressure? Blood pressure and heart disease  High blood pressure causes heart disease and increases the risk of stroke. This is more likely to develop in people who have high blood pressure readings, are of African descent, or are overweight.  Have your blood pressure checked: ? Every 3-5 years if you are 18-39 years of age. ? Every year if you are 40 years old or older. Diabetes Have regular diabetes screenings. This checks your fasting blood sugar level. Have the screening done:  Once every three years after age 40 if you are at a normal weight and have a low risk for diabetes.  More often and at a younger age if you are overweight or have a high risk for diabetes. What should I know about preventing infection? Hepatitis B If you have a higher risk for hepatitis B, you should be screened for this virus. Talk with your health care provider to find out if you are at risk for hepatitis B infection. Hepatitis C Testing is recommended for:  Everyone born from 1945 through 1965.  Anyone with known risk factors for hepatitis C. Sexually transmitted infections (STIs)  Get screened for STIs, including gonorrhea and chlamydia, if: ? You are sexually active and are younger than 84 years of age. ? You are older than 84 years of age and your health care provider tells you that you are at risk for this type of infection. ? Your sexual activity has changed since you were last screened, and you are at increased risk for chlamydia or gonorrhea. Ask your health care provider if   you are at risk.  Ask your health care provider about whether you are at high risk for HIV. Your health care provider may recommend a prescription medicine to help prevent HIV infection. If you choose to take medicine to prevent HIV, you should first get tested for HIV. You should then be tested every 3 months for as long as you are taking the medicine. Pregnancy  If you are about to stop having your period (premenopausal) and  you may become pregnant, seek counseling before you get pregnant.  Take 400 to 800 micrograms (mcg) of folic acid every day if you become pregnant.  Ask for birth control (contraception) if you want to prevent pregnancy. Osteoporosis and menopause Osteoporosis is a disease in which the bones lose minerals and strength with aging. This can result in bone fractures. If you are 65 years old or older, or if you are at risk for osteoporosis and fractures, ask your health care provider if you should:  Be screened for bone loss.  Take a calcium or vitamin D supplement to lower your risk of fractures.  Be given hormone replacement therapy (HRT) to treat symptoms of menopause. Follow these instructions at home: Lifestyle  Do not use any products that contain nicotine or tobacco, such as cigarettes, e-cigarettes, and chewing tobacco. If you need help quitting, ask your health care provider.  Do not use street drugs.  Do not share needles.  Ask your health care provider for help if you need support or information about quitting drugs. Alcohol use  Do not drink alcohol if: ? Your health care provider tells you not to drink. ? You are pregnant, may be pregnant, or are planning to become pregnant.  If you drink alcohol: ? Limit how much you use to 0-1 drink a day. ? Limit intake if you are breastfeeding.  Be aware of how much alcohol is in your drink. In the [place] one drink equals one 12 oz bottle of beer (355 mL), one 5 oz glass of wine (148 mL), or one 1 oz glass of hard liquor (44 mL). General instructions  Schedule regular health, dental, and eye exams.  Stay current with your vaccines.  Tell your health care provider if: ? You often feel depressed. ? You have ever been abused or do not feel safe at home. Summary  Adopting a healthy lifestyle and getting preventive care are important in promoting health and wellness.  Follow your health care provider's instructions about healthy  diet, exercising, and getting tested or screened for diseases.  Follow your health care provider's instructions on monitoring your cholesterol and blood pressure. This information is not intended to replace advice given to you by your health care provider. Make sure you discuss any questions you have with your health care provider. Document Revised: 12/31/2017 Document Reviewed: 12/31/2017 Elsevier Patient Education  2020 Elsevier Inc.  

## 2019-02-18 NOTE — Progress Notes (Signed)
This visit occurred during the SARS-CoV-2 public health emergency.  Safety protocols were in place, including screening questions prior to the visit, additional usage of staff PPE, and extensive cleaning of exam room while observing appropriate contact time as indicated for disinfecting solutions.  Subjective:     Patient ID: Whitney Henderson , female    DOB: 1928/08/01 , 86 y.o.   MRN: [id number]   Chief Complaint  Patient presents with  . Annual Exam  . Hypertension    HPI  She is here today for a full physical examination. She is no longer followed by GYN. She is accompanied by her daughter today.   Hypertension This is a chronic problem. The current episode started more than 1 year ago. The problem has been gradually improving since onset. The problem is controlled. Pertinent negatives include no blurred vision, chest pain, palpitations or shortness of breath. The current treatment provides moderate improvement. Compliance problems include exercise.      Past Medical History:  Diagnosis Date  . Anxiety   . Cataracts, bilateral   . Headache 04/27/2015   Left>right periorbital  . Hypertension   . Memory difficulties 09/04/2016     Family History  Problem Relation Age of Onset  . Cancer Father   . Breast cancer Daughter        97s     Current Outpatient Medications:  .  Alpha-D-Galactosidase (BEANO MELTAWAYS) TABS, Take 1 tablet by mouth daily as needed (gas). , Disp: , Rfl:  .  amLODipine (NORVASC) 2.5 MG tablet, Take 1 tablet (2.5 mg total) by mouth 2 (two) times daily., Disp: 180 tablet, Rfl: 1 .  ASPIRIN 81 PO, Take 81 mg by mouth as needed (for pain or headache). , Disp: , Rfl:  .  betamethasone dipropionate (DIPROLENE) 0.05 % cream, APPLY THIN LAYER EXTERNALLY TO THE AFFECTED AREA EVERY DAY AS NEEDED, Disp: 60 g, Rfl: 2 .  cholecalciferol (VITAMIN D) 1000 units tablet, Take 1,000 Units by mouth daily. , Disp: , Rfl:  .  Multiple Vitamins-Minerals (CENTRUM SILVER PO),  Take by mouth., Disp: , Rfl:  .  Multiple Vitamins-Minerals (PRESERVISION AREDS 2 PO), Take 1 tablet by mouth daily. , Disp: , Rfl:  .  Propylene Glycol (SYSTANE BALANCE OP), Place 1 drop into both eyes daily as needed (for itchy eyes). Uses when needed. , Disp: , Rfl:  .  sertraline (ZOLOFT) 25 MG tablet, Take 1 tablet (25 mg total) by mouth daily., Disp: 30 tablet, Rfl: 2 .  Theanine 100 MG CAPS, Take 1 capsule by mouth daily at 12 noon., Disp: , Rfl:  .  vitamin C (ASCORBIC ACID) 500 MG tablet, Take 500 mg by mouth daily., Disp: , Rfl:  .  Ginkgo Biloba 120 MG CAPS, Take 1 capsule by mouth daily at 12 noon., Disp: , Rfl:    Allergies  Allergen Reactions  . Codeine Rash     The patient states she uses status post hysterectomy for birth control. Last LMP was No LMP recorded. Patient has had a hysterectomy.. Negative for Dysmenorrhea  Negative for: breast discharge, breast lump(s), breast pain and breast self exam. Associated symptoms include abnormal vaginal bleeding. Pertinent negatives include abnormal bleeding (hematology), anxiety, decreased libido, depression, difficulty falling sleep, dyspareunia, history of infertility, nocturia, sexual dysfunction, sleep disturbances, urinary incontinence, urinary urgency, vaginal discharge and vaginal itching. Diet regular.The patient states her exercise level is   intermittent.  . The patient's tobacco use is:  Social History   Tobacco  Use  Smoking Status Former Smoker  . Years: 2.00  Smokeless Tobacco Never Used  . She has been exposed to passive smoke. The patient's alcohol use is:  Social History   Substance and Sexual Activity  Alcohol Use No    Review of Systems  Constitutional: Negative.   HENT: Negative.   Eyes: Negative.  Negative for blurred vision.  Respiratory: Negative.  Negative for shortness of breath.   Cardiovascular: Negative.  Negative for chest pain and palpitations.  Endocrine: Negative.   Genitourinary: Negative.    Musculoskeletal: Negative.   Skin: Negative.   Allergic/Immunologic: Negative.   Neurological: Positive for tremors.       She c/o memory issues. She reports her memory is not what it used to be. Daughter reports pt has tremors.  Daughter states sx are persistent, pt states sx are occasional. Unable to state what triggers her sx.   Hematological: Negative.   Psychiatric/Behavioral: The patient is nervous/anxious.      Today's Vitals   02/18/19 1045 02/18/19 1154  BP: (!) 154/82 134/72  Pulse: 76   Temp: 97.8 F (36.6 C)   TempSrc: Oral   Weight: 127 lb 9.6 oz (57.9 kg)   Height: 5' 1.4" (1.56 m)   PainSc: 0-No pain    Body mass index is 23.8 kg/m.   Objective:  Physical Exam Vitals and nursing note reviewed.  Constitutional:      Appearance: Normal appearance.  HENT:     Head: Normocephalic and atraumatic.     Right Ear: Tympanic membrane, ear canal and external ear normal.     Left Ear: Tympanic membrane, ear canal and external ear normal.     Nose:     Comments: Deferred, masked    Mouth/Throat:     Comments: Deferred, masked Eyes:     Extraocular Movements: Extraocular movements intact.     Conjunctiva/sclera: Conjunctivae normal.     Pupils: Pupils are equal, round, and reactive to light.  Cardiovascular:     Rate and Rhythm: Normal rate and regular rhythm.     Pulses: Normal pulses.     Heart sounds: Normal heart sounds.  Pulmonary:     Effort: Pulmonary effort is normal.     Breath sounds: Normal breath sounds.  Chest:     Breasts: Tanner Score is 5.        Right: Normal.        Left: Normal.  Abdominal:     General: Abdomen is flat. Bowel sounds are normal.     Palpations: Abdomen is soft.  Genitourinary:    Comments: deferred Musculoskeletal:        General: Normal range of motion.     Cervical back: Normal range of motion and neck supple.  Skin:    General: Skin is warm and dry.  Neurological:     General: No focal deficit present.     Mental  Status: She is alert and oriented to person, place, and time.     Comments: Tremor noted in UE  Psychiatric:        Mood and Affect: Mood normal.        Behavior: Behavior normal.         Assessment And Plan:     1. Routine general medical examination at health care facility  A full exam was performed.  Importance of monthly self breast exams was discussed with the patient. PATIENT IS ADVISED TO GET 30-45 MINUTES REGULAR EXERCISE NO LESS THAN FOUR  TO FIVE DAYS PER WEEK - BOTH WEIGHTBEARING EXERCISES AND AEROBIC ARE RECOMMENDED.  SHE IS ADVISED TO FOLLOW A HEALTHY DIET WITH AT LEAST SIX FRUITS/VEGGIES PER DAY, DECREASE INTAKE OF RED MEAT, AND TO INCREASE FISH INTAKE TO TWO DAYS PER WEEK.  MEATS/FISH SHOULD NOT BE FRIED, BAKED OR BROILED IS PREFERABLE.  I SUGGEST WEARING SPF 50 SUNSCREEN ON EXPOSED PARTS AND ESPECIALLY WHEN IN THE DIRECT SUNLIGHT FOR AN EXTENDED PERIOD OF TIME.  PLEASE AVOID FAST FOOD RESTAURANTS AND INCREASE YOUR WATER INTAKE.   2. Hypertensive nephropathy  Chronic, initial reading was elevated. Repeat BP reading is well controlled. She feels BP was elevated b/c she woke up late and had to rush to get dressed for appointment.  She will continue with current meds for now. She is encouraged to avoid adding salt to her foods. I will check labs as listed below. EKG performed, no new changes noted.  - CBC no Diff - CMP14+EGFR - POCT Urinalysis Dipstick (81002) - POCT UA - Microalbumin - EKG 12-Lead  3. Stage 3 chronic kidney disease, unspecified whether stage 3a or 3b CKD  Chronic, has been stable. She is encouraged to stay well hydrated and keep BP well controlled to avoid progression of CKD.   4. Memory difficulties  I will check labs as listed below. I will make further recommendations once her labs are available for review.   - Vitamin B12 - RPR  5. Tremor  New onset. She declines Neuro eval at this time. Pt and daughter will let me know if this persists or  starts to interfere with ADLs.   6. Anxiety  Chronic. She will continue with sertraline.   7. Abnormal urine  I will send urine off for culture.   - Culture, Urine   Maximino Greenland, MD    THE PATIENT IS ENCOURAGED TO PRACTICE SOCIAL DISTANCING DUE TO THE COVID-19 PANDEMIC.

## 2019-02-19 ENCOUNTER — Telehealth: Payer: Self-pay

## 2019-02-19 LAB — CMP14+EGFR
ALT: 12 IU/L (ref 0–32)
AST: 23 IU/L (ref 0–40)
Albumin/Globulin Ratio: 1.2 (ref 1.2–2.2)
Albumin: 4 g/dL (ref 3.6–4.6)
Alkaline Phosphatase: 70 IU/L (ref 39–117)
BUN/Creatinine Ratio: 19 (ref 12–28)
BUN: 24 mg/dL (ref 8–27)
Bilirubin Total: 0.3 mg/dL (ref 0.0–1.2)
CO2: 26 mmol/L (ref 20–29)
Calcium: 9.4 mg/dL (ref 8.7–10.3)
Chloride: 103 mmol/L (ref 96–106)
Creatinine, Ser: 1.27 mg/dL — ABNORMAL HIGH (ref 0.57–1.00)
GFR calc Af Amer: 44 mL/min/{1.73_m2} — ABNORMAL LOW (ref >59)
GFR calc non Af Amer: 38 mL/min/{1.73_m2} — ABNORMAL LOW (ref >59)
Globulin, Total: 3.3 g/dL (ref 1.5–4.5)
Glucose: 95 mg/dL (ref 65–99)
Potassium: 4.1 mmol/L (ref 3.5–5.2)
Sodium: 140 mmol/L (ref 134–144)
Total Protein: 7.3 g/dL (ref 6.0–8.5)

## 2019-02-19 LAB — CBC
Hematocrit: 39.6 % (ref 34.0–46.6)
Hemoglobin: 12.4 g/dL (ref 11.1–15.9)
MCH: 27.1 pg (ref 26.6–33.0)
MCHC: 31.3 g/dL — ABNORMAL LOW (ref 31.5–35.7)
MCV: 87 fL (ref 79–97)
Platelets: 213 10*3/uL (ref 150–450)
RBC: 4.57 x10E6/uL (ref 3.77–5.28)
RDW: 13.7 % (ref 11.7–15.4)
WBC: 4.6 10*3/uL (ref 3.4–10.8)

## 2019-02-19 LAB — RPR: RPR Ser Ql: NONREACTIVE

## 2019-02-19 LAB — URINE CULTURE

## 2019-02-19 LAB — VITAMIN B12: Vitamin B-12: 664 pg/mL (ref 232–1245)

## 2019-02-22 ENCOUNTER — Ambulatory Visit (INDEPENDENT_AMBULATORY_CARE_PROVIDER_SITE_OTHER): Payer: Medicare Other

## 2019-02-22 DIAGNOSIS — M810 Age-related osteoporosis without current pathological fracture: Secondary | ICD-10-CM

## 2019-02-22 DIAGNOSIS — N183 Chronic kidney disease, stage 3 unspecified: Secondary | ICD-10-CM | POA: Diagnosis not present

## 2019-02-22 DIAGNOSIS — I10 Essential (primary) hypertension: Secondary | ICD-10-CM

## 2019-02-23 NOTE — Patient Instructions (Signed)
Social Worker Visit Information  Goals we discussed today:  Goals Addressed            This Visit's Progress     Patient Stated   . "I would like to learn more about life alert options" (pt-stated)       Current Barriers:  . Limited knowledge of products available for personal emergency response systems . Lives alone with limited access to caregiver . Chronic conditions including HTN, CKD III, and Osteoporosis which increase fall risk  Social Work Clinical Goal(s):  [name] Over the next 60 days the patient will work with CM SW to become more knowledgeable of personal emergency response systems available for purchase  CCM SW Interventions: Completed 02/22/19 with the patient and her daughter [name] on a joint call . SW received referral from Dr. Baird Cancer following recent [place] requesting resource for life alert system . Discussed options with the patient and her daughter [name] including wrist and necklace options as well as systems with fall alert technology . Collaboration with the patients health plan to inquire if the patients plan has benefits to assist with a personal emergency response system (PERS) o The patients plan does not cover a PERS . Educated the patient and her daughter on the [place] which offers installation and customer service o Mailed the patient a flyer indicating types of devices available at [facility] . Scheduled follow up call over the next month to assess if further assistance is desired  Patient Self Care Activities:  . Self administers medications as prescribed . Attends all scheduled provider appointments . Performs ADL's independently . Calls provider office for new concerns or questions  Initial goal documentation       Other   . COMPLETED: Assist with Chronic Case Management by assessing patients ability to independently manage chronic conditions       Current Barriers:  [name] Knowledge barriers related to the patients ability to manage Chronic  conditions including HTN and CKD III  Clinical Social Work Clinical Goal(s):  [name] Over the next 30 days the patient will work with CCM RN Case Manager to establish an individualized care plan related to the management of patients identified chronic conditions  CCM SW Interventions: Completed 10/13/2018 . Patient interviewed and appropriate assessments performed . Assessed for the patients ability to verbalize current chronic medical conditions. The patient identified current conditioned as high blood pressure and stage three kidney disease . Determined the patient lacks education on how to best manage kidney disease especially related to appropriate diet . Advised the patient RN Case Manager would provide education surrounding diet restrictions during outreach calls . Scheduled an inititial call with RN Case Manager for October 5th . Advised the patient to gather all medications both over the counter and prescribed to go over with RN during scheduled call . Collaboration with RN Case Manager regarding patient concern for disease management related to diet  Patient Self Care Activities:  . Currently UNABLE TO independently manage chronic conditions  Initial goal documentation         Materials Provided: Yes: Mailed [place]  Follow Up Plan: SW will follow up with patient by phone over the next month   Daneen Schick, BSW, CDP Social Worker, Certified Dementia Practitioner [place] / [employer] Management [phone]

## 2019-02-23 NOTE — Chronic Care Management (AMB) (Signed)
Chronic Care Management    Social Work Follow Up Note  02/22/2019 Name: JAMARIANA SFORZA MRN: [id number] DOB: 1928-11-17  Parke Poisson Backlund is a 86 y.o. year old female who is a primary care patient of [name], MD. The CCM team was consulted for assistance with care coordination.   Review of patient status, including review of consultants reports, other relevant assessments, and collaboration with appropriate care team members and the patient's provider was performed as part of comprehensive patient evaluation and provision of chronic care management services.    SW outreached the patient to assist with care coordination.  Outpatient Encounter Medications as of 02/22/2019  Medication Sig  . Alpha-D-Galactosidase (BEANO MELTAWAYS) TABS Take 1 tablet by mouth daily as needed (gas).   [name] amLODipine (NORVASC) 2.5 MG tablet Take 1 tablet (2.5 mg total) by mouth 2 (two) times daily.  . ASPIRIN 81 PO Take 81 mg by mouth as needed (for pain or headache).   . betamethasone dipropionate (DIPROLENE) 0.05 % cream APPLY THIN LAYER EXTERNALLY TO THE AFFECTED AREA EVERY DAY AS NEEDED  . cholecalciferol (VITAMIN D) 1000 units tablet Take 1,000 Units by mouth daily.   . Ginkgo Biloba 120 MG CAPS Take 1 capsule by mouth daily at 12 noon.  . Multiple Vitamins-Minerals (CENTRUM SILVER PO) Take by mouth.  . Multiple Vitamins-Minerals (PRESERVISION AREDS 2 PO) Take 1 tablet by mouth daily.   [name] Propylene Glycol (SYSTANE BALANCE OP) Place 1 drop into both eyes daily as needed (for itchy eyes). Uses when needed.   . sertraline (ZOLOFT) 25 MG tablet Take 1 tablet (25 mg total) by mouth daily.  . Theanine 100 MG CAPS Take 1 capsule by mouth daily at 12 noon.  . vitamin C (ASCORBIC ACID) 500 MG tablet Take 500 mg by mouth daily.   No facility-administered encounter medications on file as of 02/22/2019.     Goals Addressed            This Visit's Progress     Patient Stated   . "I would like to learn more  about life alert options" (pt-stated)       Current Barriers:  . Limited knowledge of products available for personal emergency response systems . Lives alone with limited access to caregiver . Chronic conditions including HTN, CKD III, and Osteoporosis which increase fall risk  Social Work Clinical Goal(s):  [name] Over the next 60 days the patient will work with CM SW to become more knowledgeable of personal emergency response systems available for purchase  CCM SW Interventions: Completed 02/22/19 with the patient and her daughter [name] on a joint call . SW received referral from Dr. Baird Cancer following recent [place] requesting resource for life alert system . Discussed options with the patient and her daughter [name] including wrist and necklace options as well as systems with fall alert technology . Collaboration with the patients health plan to inquire if the patients plan has benefits to assist with a personal emergency response system (PERS) o The patients plan does not cover a PERS . Educated the patient and her daughter on the [place] which offers installation and customer service o Mailed the patient a flyer indicating types of devices available at [facility] . Scheduled follow up call over the next month to assess if further assistance is desired  Patient Self Care Activities:  . Self administers medications as prescribed . Attends all scheduled provider appointments . Performs ADL's independently . Calls provider office for new  concerns or questions  Initial goal documentation       Other   . COMPLETED: Assist with Chronic Case Management by assessing patients ability to independently manage chronic conditions       Current Barriers:  [name] Knowledge barriers related to the patients ability to manage Chronic conditions including HTN and CKD III  Clinical Social Work Clinical Goal(s):  [name] Over the next 30 days the patient will work with CCM RN Case Manager to establish an  individualized care plan related to the management of patients identified chronic conditions  CCM SW Interventions: Completed 10/13/2018 . Patient interviewed and appropriate assessments performed . Assessed for the patients ability to verbalize current chronic medical conditions. The patient identified current conditioned as high blood pressure and stage three kidney disease . Determined the patient lacks education on how to best manage kidney disease especially related to appropriate diet . Advised the patient RN Case Manager would provide education surrounding diet restrictions during outreach calls . Scheduled an inititial call with RN Case Manager for October 5th . Advised the patient to gather all medications both over the counter and prescribed to go over with RN during scheduled call . Collaboration with RN Case Manager regarding patient concern for disease management related to diet  Patient Self Care Activities:  . Currently UNABLE TO independently manage chronic conditions  Initial goal documentation         Follow Up Plan: SW will follow up with patient by phone over the next month.   Daneen Schick, BSW, CDP Social Worker, Certified Dementia Practitioner [place] / [place] Management [phone]  Total time spent performing care coordination and/or care management activities with the patient by phone or face to face = 32 minutes.

## 2019-03-31 DIAGNOSIS — H40013 Open angle with borderline findings, low risk, bilateral: Secondary | ICD-10-CM | POA: Diagnosis not present

## 2019-04-05 ENCOUNTER — Telehealth: Payer: Self-pay

## 2019-04-08 ENCOUNTER — Telehealth: Payer: Self-pay

## 2019-04-08 ENCOUNTER — Ambulatory Visit: Payer: Self-pay

## 2019-04-08 DIAGNOSIS — N183 Chronic kidney disease, stage 3 unspecified: Secondary | ICD-10-CM

## 2019-04-08 DIAGNOSIS — I10 Essential (primary) hypertension: Secondary | ICD-10-CM

## 2019-04-08 NOTE — Chronic Care Management (AMB) (Signed)
  Chronic Care Management   Outreach Note  04/08/2019 Name: Whitney Henderson MRN: [id number] DOB: 06-13-1928  Referred by: [name], MD Reason for referral : Care Coordination   Unsuccessful outbound call placed to the patient to confirm receipt of mailed resource. SW left a HIPAA compliant voice message requesting a return call.  Follow Up Plan: The care management team will reach out to the patient again over the next 10 days.   Daneen Schick, BSW, CDP Social Worker, Certified Dementia Practitioner [place] / [place] Management [phone]

## 2019-04-14 ENCOUNTER — Ambulatory Visit: Payer: Self-pay

## 2019-04-14 DIAGNOSIS — N183 Chronic kidney disease, stage 3 unspecified: Secondary | ICD-10-CM

## 2019-04-14 DIAGNOSIS — I10 Essential (primary) hypertension: Secondary | ICD-10-CM

## 2019-04-14 NOTE — Chronic Care Management (AMB) (Signed)
  Chronic Care Management   Outreach Note  04/14/2019 Name: Whitney Henderson MRN: [id number] DOB: November 27, 1928  Referred by: [name], MD Reason for referral : Care Coordination   SW placed a successful outbound call to the patient to assist with care coordination needs. The patient requested SW contact her daughter, [name]. Unfortunately, the call to [facility] was unsuccessful. SW left a HIPAA compliant voice message requesting a return call.  Follow Up Plan: The care management team will reach out to the patient again over the next 14 days.   Daneen Schick, BSW, CDP Social Worker, Certified Dementia Practitioner [place] / [place] Management [phone]

## 2019-04-26 ENCOUNTER — Telehealth: Payer: Self-pay

## 2019-04-28 ENCOUNTER — Ambulatory Visit: Payer: Self-pay

## 2019-04-28 DIAGNOSIS — I10 Essential (primary) hypertension: Secondary | ICD-10-CM

## 2019-04-28 DIAGNOSIS — N183 Chronic kidney disease, stage 3 unspecified: Secondary | ICD-10-CM

## 2019-04-28 NOTE — Chronic Care Management (AMB) (Signed)
Chronic Care Management    Social Work Follow Up Note  04/28/2019 Name: Whitney Henderson MRN: [id number] DOB: 1928-11-11  Whitney Henderson Whitney Henderson is a 86 y.o. year old female who is a primary care patient of [name], MD. The CCM team was consulted for assistance with care coordination.   Review of patient status, including review of consultants reports, other relevant assessments, and collaboration with appropriate care team members and the patient's provider was performed as part of comprehensive patient evaluation and provision of chronic care management services.    SDOH (Social Determinants of Health) assessments performed: No    Outpatient Encounter Medications as of 04/28/2019  Medication Sig  . Alpha-D-Galactosidase (BEANO MELTAWAYS) TABS Take 1 tablet by mouth daily as needed (gas).   [name] amLODipine (NORVASC) 2.5 MG tablet Take 1 tablet (2.5 mg total) by mouth 2 (two) times daily.  . ASPIRIN 81 PO Take 81 mg by mouth as needed (for pain or headache).   . betamethasone dipropionate (DIPROLENE) 0.05 % cream APPLY THIN LAYER EXTERNALLY TO THE AFFECTED AREA EVERY DAY AS NEEDED  . cholecalciferol (VITAMIN D) 1000 units tablet Take 1,000 Units by mouth daily.   . Ginkgo Biloba 120 MG CAPS Take 1 capsule by mouth daily at 12 noon.  . Multiple Vitamins-Minerals (CENTRUM SILVER PO) Take by mouth.  . Multiple Vitamins-Minerals (PRESERVISION AREDS 2 PO) Take 1 tablet by mouth daily.   [name] Propylene Glycol (SYSTANE BALANCE OP) Place 1 drop into both eyes daily as needed (for itchy eyes). Uses when needed.   . sertraline (ZOLOFT) 25 MG tablet Take 1 tablet (25 mg total) by mouth daily.  . Theanine 100 MG CAPS Take 1 capsule by mouth daily at 12 noon.  . vitamin C (ASCORBIC ACID) 500 MG tablet Take 500 mg by mouth daily.   No facility-administered encounter medications on file as of 04/28/2019.     Goals Addressed            This Visit's Progress     Patient Stated   . COMPLETED: "I would like  to learn more about life alert options" (pt-stated)       Current Barriers:  . Limited knowledge of products available for personal emergency response systems . Lives alone with limited access to caregiver . Chronic conditions including HTN, CKD III, and Osteoporosis which increase fall risk  Social Work Clinical Goal(s):  [name] Over the next 60 days the patient will work with CM SW to become more knowledgeable of personal emergency response systems available for purchase  CCM SW Interventions: Completed 04/28/2019 with the patient and her daughter [name] on a joint call . Successful outbound call to assess progression of patient goal . Determined the patient has contacted [place] and is awaiting a return call to schedule an in home appointment to review products and obtain best option for the patient . Encouraged the patient and her daughter to contact SW for future resource needs . Scheduled follow up RN Care Manager appointment over the next month  Patient Self Care Activities:  . Self administers medications as prescribed . Attends all scheduled provider appointments . Performs ADL's independently . Calls provider office for new concerns or questions  Please see past updates related to this goal by clicking on the "Past Updates" button in the selected goal          Follow Up Plan: No further SW follow up planned at this time. The patient will remain active with  RN Care Manager for chronic care management needs.   Daneen Schick, BSW, CDP Social Worker, Certified Dementia Practitioner [place] / [place] Management [phone]  Total time spent performing care coordination and/or care management activities with the patient by phone or face to face = 8 minutes.

## 2019-04-28 NOTE — Patient Instructions (Signed)
Social Worker Visit Information  Goals we discussed today:  Goals Addressed            This Visit's Progress     Patient Stated   . COMPLETED: "I would like to learn more about life alert options" (pt-stated)       Current Barriers:  . Limited knowledge of products available for personal emergency response systems . Lives alone with limited access to caregiver . Chronic conditions including HTN, CKD III, and Osteoporosis which increase fall risk  Social Work Clinical Goal(s):  [name] Over the next 60 days the patient will work with CM SW to become more knowledgeable of personal emergency response systems available for purchase  CCM SW Interventions: Completed 04/28/2019 with the patient and her daughter [name] on a joint call . Successful outbound call to assess progression of patient goal . Determined the patient has contacted [place] and is awaiting a return call to schedule an in home appointment to review products and obtain best option for the patient . Encouraged the patient and her daughter to contact SW for future resource needs . Scheduled follow up RN Care Manager appointment over the next month  Patient Self Care Activities:  . Self administers medications as prescribed . Attends all scheduled provider appointments . Performs ADL's independently . Calls provider office for new concerns or questions  Please see past updates related to this goal by clicking on the "Past Updates" button in the selected goal          Follow Up Plan: No SW follow up planned. Please contact me with future resource needs.   Daneen Schick, BSW, CDP Social Worker, Certified Dementia Practitioner [place] / [place] Management [phone]

## 2019-05-04 ENCOUNTER — Other Ambulatory Visit: Payer: Self-pay | Admitting: Internal Medicine

## 2019-05-04 DIAGNOSIS — I10 Essential (primary) hypertension: Secondary | ICD-10-CM

## 2019-05-14 ENCOUNTER — Ambulatory Visit: Payer: Self-pay

## 2019-05-14 DIAGNOSIS — I10 Essential (primary) hypertension: Secondary | ICD-10-CM

## 2019-05-14 DIAGNOSIS — N183 Chronic kidney disease, stage 3 unspecified: Secondary | ICD-10-CM

## 2019-05-14 NOTE — Chronic Care Management (AMB) (Signed)
  Chronic Care Management    Social Work Follow Up Note  05/14/2019 Name: JANEIRA LOUGHRAN MRN: [id number] DOB: 02/13/1928  Parke Poisson Gosnell is a 86 y.o. year old female who is a primary care patient of [name], MD. The CCM team was consulted for assistance with care coordination.   Review of patient status, including review of consultants reports, other relevant assessments, and collaboration with appropriate care team members and the patient's provider was performed as part of comprehensive patient evaluation and provision of chronic care management services.    SDOH (Social Determinants of Health) assessments performed: No    Outpatient Encounter Medications as of 05/14/2019  Medication Sig  . Alpha-D-Galactosidase (BEANO MELTAWAYS) TABS Take 1 tablet by mouth daily as needed (gas).   [name] amLODipine (NORVASC) 2.5 MG tablet TAKE 1 TABLET(2.5 MG) BY MOUTH TWICE DAILY  . ASPIRIN 81 PO Take 81 mg by mouth as needed (for pain or headache).   . betamethasone dipropionate (DIPROLENE) 0.05 % cream APPLY THIN LAYER EXTERNALLY TO THE AFFECTED AREA EVERY DAY AS NEEDED  . cholecalciferol (VITAMIN D) 1000 units tablet Take 1,000 Units by mouth daily.   . Ginkgo Biloba 120 MG CAPS Take 1 capsule by mouth daily at 12 noon.  . Multiple Vitamins-Minerals (CENTRUM SILVER PO) Take by mouth.  . Multiple Vitamins-Minerals (PRESERVISION AREDS 2 PO) Take 1 tablet by mouth daily.   [name] Propylene Glycol (SYSTANE BALANCE OP) Place 1 drop into both eyes daily as needed (for itchy eyes). Uses when needed.   . sertraline (ZOLOFT) 25 MG tablet Take 1 tablet (25 mg total) by mouth daily.  . Theanine 100 MG CAPS Take 1 capsule by mouth daily at 12 noon.  . vitamin C (ASCORBIC ACID) 500 MG tablet Take 500 mg by mouth daily.   No facility-administered encounter medications on file as of 05/14/2019.     SW placed a successful outbound call to the patients daughter, [name], in response to a voice message received  requesting a follow up call. [name] informed SW she and the patient spoke with representative from [place] program regarding devices. They have decided to wait on ordering a device due to patient concern of having a lock box available outside for paramedics to access a key as needed.   [name] reports she and her brother are very close to the patient and check on her often so they feel confident in supporting the patients decision at this time. [name] further states should the patient decline, the family will reconsider an emergency response system.    Follow Up Plan: SW encouraged [name] to contact SW with future resource needs.   Daneen Schick, BSW, CDP Social Worker, Certified Dementia Practitioner [place] / [place] Management [phone]

## 2019-05-19 ENCOUNTER — Other Ambulatory Visit: Payer: Self-pay

## 2019-05-19 ENCOUNTER — Ambulatory Visit (INDEPENDENT_AMBULATORY_CARE_PROVIDER_SITE_OTHER): Payer: Medicare Other | Admitting: Internal Medicine

## 2019-05-19 ENCOUNTER — Encounter: Payer: Self-pay | Admitting: Internal Medicine

## 2019-05-19 VITALS — BP 144/76 | HR 60 | Temp 98.2°F | Ht 61.4 in | Wt 126.6 lb

## 2019-05-19 DIAGNOSIS — I10 Essential (primary) hypertension: Secondary | ICD-10-CM

## 2019-05-19 DIAGNOSIS — M329 Systemic lupus erythematosus, unspecified: Secondary | ICD-10-CM | POA: Diagnosis not present

## 2019-05-19 DIAGNOSIS — I129 Hypertensive chronic kidney disease with stage 1 through stage 4 chronic kidney disease, or unspecified chronic kidney disease: Secondary | ICD-10-CM

## 2019-05-19 DIAGNOSIS — R251 Tremor, unspecified: Secondary | ICD-10-CM

## 2019-05-19 DIAGNOSIS — N183 Chronic kidney disease, stage 3 unspecified: Secondary | ICD-10-CM | POA: Diagnosis not present

## 2019-05-19 DIAGNOSIS — R413 Other amnesia: Secondary | ICD-10-CM | POA: Diagnosis not present

## 2019-05-19 DIAGNOSIS — K5909 Other constipation: Secondary | ICD-10-CM

## 2019-05-19 MED ORDER — SERTRALINE HCL 25 MG PO TABS: 25.0000 mg | ORAL_TABLET | Freq: Every day | ORAL | 1 refills | Status: DC

## 2019-05-19 NOTE — Patient Instructions (Addendum)
Start stool softener - Colace one capsule twice daily  Dulcolax suppository - one per rectum   Constipation, Adult Constipation is when a person:  Poops (has a bowel movement) fewer times in a week than normal.  Has a hard time pooping.  Has poop that is dry, hard, or bigger than normal. Follow these instructions at home: Eating and drinking   Eat foods that have a lot of fiber, such as: ? Fresh fruits and vegetables. ? Whole grains. ? Beans.  Eat less of foods that are high in fat, low in fiber, or overly processed, such as: ? [place] fries. ? Hamburgers. ? Cookies. ? Candy. ? Soda.  Drink enough fluid to keep your pee (urine) clear or pale yellow. General instructions  Exercise regularly or as told by your doctor.  Go to the restroom when you feel like you need to poop. Do not hold it in.  Take over-the-counter and prescription medicines only as told by your doctor. These include any fiber supplements.  Do pelvic floor retraining exercises, such as: ? Doing deep breathing while relaxing your lower belly (abdomen). ? Relaxing your pelvic floor while pooping.  Watch your condition for any changes.  Keep all follow-up visits as told by your doctor. This is important. Contact a doctor if:  You have pain that gets worse.  You have a fever.  You have not pooped for 4 days.  You throw up (vomit).  You are not hungry.  You lose weight.  You are bleeding from the anus.  You have thin, pencil-like poop (stool). Get help right away if:  You have a fever, and your symptoms suddenly get worse.  You leak poop or have blood in your poop.  Your belly feels hard or bigger than normal (is bloated).  You have very bad belly pain.  You feel dizzy or you faint. This information is not intended to replace advice given to you by your health care provider. Make sure you discuss any questions you have with your health care provider. Document Revised: 12/20/2016  Document Reviewed: 06/28/2015 Elsevier Patient Education  2020 [employer].

## 2019-05-20 ENCOUNTER — Ambulatory Visit (INDEPENDENT_AMBULATORY_CARE_PROVIDER_SITE_OTHER): Payer: Medicare Other

## 2019-05-20 ENCOUNTER — Telehealth: Payer: Self-pay

## 2019-05-20 ENCOUNTER — Other Ambulatory Visit: Payer: Self-pay

## 2019-05-20 DIAGNOSIS — N183 Chronic kidney disease, stage 3 unspecified: Secondary | ICD-10-CM

## 2019-05-20 DIAGNOSIS — I10 Essential (primary) hypertension: Secondary | ICD-10-CM | POA: Diagnosis not present

## 2019-05-20 DIAGNOSIS — M810 Age-related osteoporosis without current pathological fracture: Secondary | ICD-10-CM | POA: Diagnosis not present

## 2019-05-21 LAB — PROTEIN ELECTROPHORESIS, SERUM
A/G Ratio: 0.7 (ref 0.7–1.7)
Albumin ELP: 3 g/dL (ref 2.9–4.4)
Alpha 1: 0.3 g/dL (ref 0.0–0.4)
Alpha 2: 0.9 g/dL (ref 0.4–1.0)
Beta: 1.3 g/dL (ref 0.7–1.3)
Gamma Globulin: 1.7 g/dL (ref 0.4–1.8)
Globulin, Total: 4.2 g/dL — ABNORMAL HIGH (ref 2.2–3.9)
Total Protein: 7.2 g/dL (ref 6.0–8.5)

## 2019-05-21 LAB — RHEUMATOID FACTOR: Rheumatoid fact SerPl-aCnc: <10 IU/mL (ref 0.0–13.9)

## 2019-05-21 LAB — PHOSPHORUS: Phosphorus: 3.9 mg/dL (ref 3.0–4.3)

## 2019-05-21 LAB — BMP8+EGFR
BUN/Creatinine Ratio: 18 (ref 12–28)
BUN: 26 mg/dL (ref 8–27)
CO2: 22 mmol/L (ref 20–29)
Calcium: 9.4 mg/dL (ref 8.7–10.3)
Chloride: 102 mmol/L (ref 96–106)
Creatinine, Ser: 1.44 mg/dL — ABNORMAL HIGH (ref 0.57–1.00)
GFR calc Af Amer: 37 mL/min/{1.73_m2} — ABNORMAL LOW (ref >59)
GFR calc non Af Amer: 32 mL/min/{1.73_m2} — ABNORMAL LOW (ref >59)
Glucose: 101 mg/dL — ABNORMAL HIGH (ref 65–99)
Potassium: 4.6 mmol/L (ref 3.5–5.2)
Sodium: 139 mmol/L (ref 134–144)

## 2019-05-21 LAB — ANTINUCLEAR ANTIBODIES, IFA: ANA Titer 1: NEGATIVE

## 2019-05-21 LAB — URIC ACID: Uric Acid: 5.3 mg/dL (ref 3.1–7.9)

## 2019-05-21 LAB — CYCLIC CITRUL PEPTIDE ANTIBODY, IGG/IGA: Cyclic Citrullin Peptide Ab: 5 units (ref 0–19)

## 2019-05-21 LAB — SEDIMENTATION RATE: Sed Rate: 36 mm/hr (ref 0–40)

## 2019-05-23 NOTE — Progress Notes (Signed)
This visit occurred during the SARS-CoV-2 public health emergency.  Safety protocols were in place, including screening questions prior to the visit, additional usage of staff PPE, and extensive cleaning of exam room while observing appropriate contact time as indicated for disinfecting solutions.  Subjective:     Patient ID: Whitney Henderson , female    DOB: 07/25/1928 , 86 y.o.   MRN: [id number]   Chief Complaint  Patient presents with  . Hypertension    HPI  She presents for BP check. She reports compliance with meds.   Hypertension This is a chronic problem. The current episode started more than 1 year ago. The problem has been gradually improving since onset. The problem is uncontrolled. Pertinent negatives include no blurred vision, chest pain, headaches, neck pain, orthopnea, palpitations or shortness of breath. The current treatment provides moderate improvement. Compliance problems include exercise.      Past Medical History:  Diagnosis Date  . Anxiety   . Cataracts, bilateral   . Headache 04/27/2015   Left>right periorbital  . Hypertension   . Memory difficulties 09/04/2016     Family History  Problem Relation Age of Onset  . Early death Mother   . Cancer Father   . Breast cancer Daughter        3s     Current Outpatient Medications:  .  Alpha-D-Galactosidase (BEANO MELTAWAYS) TABS, Take 1 tablet by mouth daily as needed (gas). , Disp: , Rfl:  .  amLODipine (NORVASC) 2.5 MG tablet, TAKE 1 TABLET(2.5 MG) BY MOUTH TWICE DAILY, Disp: 180 tablet, Rfl: 1 .  ASPIRIN 81 PO, Take 81 mg by mouth as needed (for pain or headache). , Disp: , Rfl:  .  betamethasone dipropionate (DIPROLENE) 0.05 % cream, APPLY THIN LAYER EXTERNALLY TO THE AFFECTED AREA EVERY DAY AS NEEDED, Disp: 60 g, Rfl: 2 .  cholecalciferol (VITAMIN D) 1000 units tablet, Take 1,000 Units by mouth daily. , Disp: , Rfl:  .  Multiple Vitamins-Minerals (CENTRUM SILVER PO), Take by mouth., Disp: , Rfl:  .   Multiple Vitamins-Minerals (PRESERVISION AREDS 2 PO), Take 1 tablet by mouth daily. , Disp: , Rfl:  .  Propylene Glycol (SYSTANE BALANCE OP), Place 1 drop into both eyes daily as needed (for itchy eyes). Uses when needed. , Disp: , Rfl:  .  sertraline (ZOLOFT) 25 MG tablet, Take 1 tablet (25 mg total) by mouth daily., Disp: 90 tablet, Rfl: 1 .  Theanine 100 MG CAPS, Take 1 capsule by mouth daily at 12 noon., Disp: , Rfl:  .  vitamin C (ASCORBIC ACID) 500 MG tablet, Take 500 mg by mouth daily., Disp: , Rfl:  .  Ginkgo Biloba 120 MG CAPS, Take 1 capsule by mouth daily at 12 noon., Disp: , Rfl:    Allergies  Allergen Reactions  . Codeine Rash     Review of Systems  Constitutional: Negative.   Eyes: Negative for blurred vision.  Respiratory: Negative.  Negative for shortness of breath.   Cardiovascular: Negative.  Negative for chest pain, palpitations and orthopnea.  Gastrointestinal: Positive for constipation.  Musculoskeletal: Negative for neck pain.  Neurological: Positive for tremors. Negative for headaches.       She c/o having memory issues. She states her daughter has noticed these issues.   Psychiatric/Behavioral: Negative.      Today's Vitals   05/19/19 1033  BP: (!) 144/76  Pulse: 60  Temp: 98.2 F (36.8 C)  TempSrc: Oral  Weight: 126 lb 9.6 oz (57.4  kg)  Height: 5' 1.4" (1.56 m)  PainSc: 0-No pain   Body mass index is 23.61 kg/m.   Objective:  Physical Exam Vitals and nursing note reviewed.  Constitutional:      Appearance: Normal appearance.  HENT:     Head: Normocephalic and atraumatic.  Cardiovascular:     Rate and Rhythm: Normal rate and regular rhythm.     Heart sounds: Normal heart sounds.  Pulmonary:     Effort: Pulmonary effort is normal.     Breath sounds: Normal breath sounds.  Skin:    General: Skin is warm.  Neurological:     General: No focal deficit present.     Mental Status: She is alert.     Comments: UE tremor  Psychiatric:         Mood and Affect: Mood normal.        Behavior: Behavior normal.         Assessment And Plan:     1. hypertensive nephropathy  Chronic, fair control. She will continue with current meds. She is encouraged to avoid adding salt to her foods.   2. Stage 3 chronic kidney disease, unspecified whether stage 3a or 3b CKD  Chronic, I will check renal function today. I will check other pertinent labs as well.  I did explain to her importance of achieving optimal BP control to prevent progression of CKD.   - BMP8+EGFR - Protein electrophoresis, serum - Phosphorus  3. Tremor  She agrees to Neuro eval for further evaluation.   - Ambulatory referral to Neurology  4. Memory difficulties  Previous bloodwork including TSH, vitamin B12 reviewed, these are normal. She agrees to Neuro referral.   - Ambulatory referral to Neurology  5. Lupus ([place])  She reports h/o lupus. She is no longer followed by Rheumatology. I will check labs as listed below.      - ANA, IFA (with reflex) - CYCLIC CITRUL PEPTIDE ANTIBODY, IGG/IGA - Rheumatoid factor - Sedimentation rate - Uric acid  6. Other constipation  Patient was advised that this could exacerbate her back pain. She is advised to increase her water intake and to try stool softener. If no relief, advised to use Dulcolax suppository if needed. Pt advised to empty rectal vault prior to taking any laxative, to decrease abdominal cramps.    Maximino Greenland, MD    THE PATIENT IS ENCOURAGED TO PRACTICE SOCIAL DISTANCING DUE TO THE COVID-19 PANDEMIC.

## 2019-05-24 ENCOUNTER — Ambulatory Visit: Payer: Self-pay

## 2019-05-24 DIAGNOSIS — I10 Essential (primary) hypertension: Secondary | ICD-10-CM

## 2019-05-24 DIAGNOSIS — M810 Age-related osteoporosis without current pathological fracture: Secondary | ICD-10-CM

## 2019-05-24 DIAGNOSIS — N183 Chronic kidney disease, stage 3 unspecified: Secondary | ICD-10-CM

## 2019-05-24 NOTE — Chronic Care Management (AMB) (Signed)
Chronic Care Management   Follow Up Note   05/21/2019 Name: Whitney Henderson MRN: [id number] DOB: [birth year]  Referred by: [name], MD Reason for referral : Chronic Care Management (FU RN Call )   Whitney Henderson is a 86 y.o. year old female who is a primary care patient of [name], MD. The CCM team was consulted for assistance with chronic disease management and care coordination needs.    Review of patient status, including review of consultants reports, relevant laboratory and other test results, and collaboration with appropriate care team members and the patient's provider was performed as part of comprehensive patient evaluation and provision of chronic care management services.    SDOH (Social Determinants of Health) assessments performed: Yes - No Acute Challenges identified at this time See Care Plan activities for detailed interventions related to [place])    Placed outbound call to patient for a CCM RNCM follow up.   Outpatient Encounter Medications as of 05/20/2019  Medication Sig  . Alpha-D-Galactosidase (BEANO MELTAWAYS) TABS Take 1 tablet by mouth daily as needed (gas).   [name] amLODipine (NORVASC) 2.5 MG tablet TAKE 1 TABLET(2.5 MG) BY MOUTH TWICE DAILY  . ASPIRIN 81 PO Take 81 mg by mouth as needed (for pain or headache).   . betamethasone dipropionate (DIPROLENE) 0.05 % cream APPLY THIN LAYER EXTERNALLY TO THE AFFECTED AREA EVERY DAY AS NEEDED  . cholecalciferol (VITAMIN D) 1000 units tablet Take 1,000 Units by mouth daily.   . Ginkgo Biloba 120 MG CAPS Take 1 capsule by mouth daily at 12 noon.  . Multiple Vitamins-Minerals (CENTRUM SILVER PO) Take by mouth.  . Multiple Vitamins-Minerals (PRESERVISION AREDS 2 PO) Take 1 tablet by mouth daily.   [name] Propylene Glycol (SYSTANE BALANCE OP) Place 1 drop into both eyes daily as needed (for itchy eyes). Uses when needed.   . sertraline (ZOLOFT) 25 MG tablet Take 1 tablet (25 mg total) by mouth daily.  . Theanine 100 MG  CAPS Take 1 capsule by mouth daily at 12 noon.  . vitamin C (ASCORBIC ACID) 500 MG tablet Take 500 mg by mouth daily.   No facility-administered encounter medications on file as of 05/20/2019.     Objective:  No results found for: HGBA1C Lab Results  Component Value Date   MICROALBUR 10 02/18/2019   LDLCALC 103 (H) 11/05/2018   CREATININE 1.44 (H) 05/19/2019   BP Readings from Last 3 Encounters:  05/19/19 (!) 144/76  02/18/19 134/72  12/01/18 (!) 150/76    Goals Addressed      Patient Stated   . COMPLETED: "I have a lot pain in my back, hips and legs" (pt-stated)       Current Barriers:  [name] Knowledge Deficits related to Self Health management for Osteoarthritis . Impaired Physical Mobility  . Poor gait/balance . Risk for self care deficit  . Risk for falls  Nurse Case Manager Clinical Goal(s):  [name] Over the next 30 days, patient will verbalize understanding of plan for in home PT . Over the next 90 days, patient will verbalize improved physical mobility and improved Self Health management of pain related to Osteoarthritis  .  CCM RN CM Interventions:  05/21/19 call completed with patient  . Evaluation of current treatment plan related to Osteoarthritis and patient's adherence to plan as established by provider . Determined patient has completed in home PT/OT to help with strengthening and balance . Determined patient found the therapy to be very effective for her back, hip  and leg pain and she is adhering to her HEP as instructed by PT with ongoing effectiveness for her pain and ROM  . Discussed plans with patient for ongoing care management follow up and provided patient with direct contact information for care management team  Patient Self Care Activities:  . Attends all scheduled provider appointments . Calls pharmacy for medication refills . Attends church or other social activities . Performs ADL's independently . Performs IADL's independently . Calls provider office  for new concerns or questions  Please see past updates related to this goal by clicking on the "Past Updates" button in the selected goal      . "I would like to improve my kidney function" (pt-stated)       CARE PLAN ENTRY (see longitudinal plan of care for additional care plan information)  Current Barriers:  [name] Knowledge Deficits related to disease process and Self Health prevention and management of CKD . Chronic Disease Management support and education needs related to HTN, Chronic Renal Disease stage III, Hypertensive Nephropathy, osteoporosis  Nurse Case Manager Clinical Goal(s):  [name] Over the next 90 days, patient will work with CCM RN CM and PCP to address needs related to disease education and support to improve Self Health management of CKD   CCM RN CM Interventions:  05/21/19 call completed with patient  . Inter-disciplinary care team collaboration (see longitudinal plan of care) . Evaluation of current treatment plan related to CKD and patient's adherence to plan as established by provider . Reviewed and educated on stages of CKD, discussed most recent GFR for patient obtained on 05/19/19 is 37 . Provided education to patient [id number] water to 7-8 8 oz glasses or bottles per day unless otherwise directed by MD; limit Sodium in her diet by avoiding table salt, avoiding can foods and deli meats; educated on importance of keeping BP <130/80 consistently  . Discussed plans with patient for ongoing care management follow up and provided patient with direct contact information for care management team . Reviewed scheduled/upcoming provider appointments including: next PCP f/u with Dr. Baird Cancer scheduled for 08/16/19 @11 :87 AM . Mailed printed patient educational material; 6 Ways to be Water Wise  Patient Self Care Activities:  . Self administers medications as prescribed . Attends all scheduled provider appointments . Calls pharmacy for medication refills . Performs ADL's  independently . Performs IADL's independently . Calls provider office for new concerns or questions  Initial goal documentation     . COMPLETED: "I would like to learn more about nutrition" (pt-stated)       Current Barriers:  [name] Knowledge Deficits related to nutritional requirements for the elderly . Chronic Renal Disease Stage III  Nurse Case Manager Clinical Goal(s):  [name] Over the next 60 days, patient will work with the CCM team to address needs related to nutrition and hydration  CCM RN CM Interventions:  05/21/19 call completed with patient and daughter Whitney Henderson  . Evaluation of current treatment plan related to current nutritional/caloric intake and patient's adherence to plan as established by provider . Provided education to patient re: the importance of staying well hydrated and eating a well balanced diet; discussed age related challenges; discussed healthy food choices for the elderly . Discussed with daughter the importance of patient eating a low Sodium diet and drinking plenty of water to help manage BP and improve kidney function . Encouraged to have patient eating fresh or frozen food and to avoid processed and or canned foods . Determined patient eats  more can food than her daughter would like and Whitney Henderson feels her mother may benefit from having home delivered meals . Sent in basket message to embedded [place] with request to contact daughter Whitney Henderson to discuss home delivered meals for patient  . Determined patient received the printed educational materials related to choosing healthy meals; Elderly nutrition 40; Discussed daughter and patient will review this information to help with nutritional guidance and will call with questions . Discussed plans with patient for ongoing care management follow up and provided patient with direct contact information for care management team  Patient Self Care Activities:  . Self administers medications as prescribed . Attends all  scheduled provider appointments . Calls pharmacy for medication refills . Attends church or other social activities . Performs ADL's independently . Performs IADL's independently . Calls provider office for new concerns or questions  Please see past updates related to this goal by clicking on the "Past Updates" button in the selected goal     . "My BP often fluxerates to the high side" (pt-stated)       CARE PLAN ENTRY (see longitudinal plan of care for additional care plan information)  Current Barriers:  [name] Knowledge Deficits related to disease process and Self Health management of HTN . Chronic Disease Management support and education needs related to HTN, Chronic Renal Disease stage III, Hypertensive Nephropathy, osteoporosis  Nurse Case Manager Clinical Goal(s):  [name] Over the next 90 days, patient will work with [place] and PCP to address needs related to disease education and support to improve Self Health management of HTN  CCM RN CM Interventions:  05/21/19 call completed with patient  . Inter-disciplinary care team collaboration (see longitudinal plan of care) . Evaluation of current treatment plan related to HTN and patient's adherence to plan as established by provider. . Provided education to patient re: potential complications of uncontrolled HTN including CKD; reviewed and discussed patient is able to monitor her BP at home and notes her BP fluctuates on the higher side; patient is unable to locate her BP log at this time; Educated patient and daughter on ways to improve Self Health management of HTN, including keeping BP <130/80 consistently, limiting Sodium, improving diet by eating fresh or frozen foods, avoiding processed foods, and or deli meats; increased water to 7-8 8 oz glasses per day unless otherwise directed by MD, staying active and participating in routine exercise, maintaining a healthy weight  . Reviewed medications with patient and discussed indication, dosage  and frequency of prescribed Hypertensive medications, patient reports understanding and adherence  . Discussed plans with patient for ongoing care management follow up and provided patient with direct contact information for care management team . Advised patient, providing education and rationale, to monitor blood pressure daily and record, calling CCM RN CM and or PCP for findings outside established parameters.  . Provided patient with printed educational materials related to What is High Blood Pressure?; African Americans and High Blood Pressure; High Blood Pressure and Stroke; Why Should I Lower Sodium? BP Log  Patient Self Care Activities:  . Self administers medications as prescribed . Attends all scheduled provider appointments . Calls pharmacy for medication refills . Performs ADL's independently . Performs IADL's independently . Calls provider office for new concerns or questions  Initial goal documentation     . COMPLETED: "My Prolia is too expensive & I need a medication for my osteoporosis (pt-stated)       Current Barriers:  . Inability  to pay high monthly co-pays  Clinical Social Work Clinical Goal(s):  [name] Over the next 45 days the patient will work with embedded PharmD to address problems related to medication costs.  CCM RN CM Interventions: 05/21/19 call completed with patient  . Evaluation of current treatment plan related to Osteoporosis and patient's adherence to plan as established by provider . Determined patient decided not to take Prolia due to fear of SE . Discussed and determined will have PCP continue to monitor her osteoporosis but will not pursue treatment at this time  . Discussed plans with patient for ongoing care management follow up and provided patient with direct contact information for care management team  Patient Self Care Activities:  . Attends all scheduled provider appointments . Calls pharmacy for medication refills . Performs ADL's  independently  Please see past updates related to this goal by clicking on the "Past Updates" button in the selected goal       Other   . Neurology Referral for tremor & memory difficulty       CARE PLAN ENTRY (see longitudinal plan of care for additional care plan information)  Current Barriers:  [name] Knowledge Deficits related to evaluation and treatment for tremor and memory difficulties . Chronic Disease Management support and education needs related to HTN, Chronic Renal Disease stage III, Hypertensive Nephropathy, osteoporosis, tremor and memory difficulties  Nurse Case Manager Clinical Goal(s):  [name] Over the next 90 days, patient will work with CCM RN CM and PCP  to address needs related to disease education and support to improve Self Health management of tremors and memory difficulties   CCM RN CM Interventions:  05/21/19 call completed with patient  . Inter-disciplinary care team collaboration (see longitudinal plan of care) . Evaluation of current treatment plan related to tremors and memory difficulties and patient's adherence to plan as established by provider . Determined PCP sent referral for Neurology for evaluation of and treatment for tremors and memory difficulties  . Discussed plans with patient for ongoing care management follow up and provided patient with direct contact information for care management team  Patient Self Care Activities:  . Self administers medications as prescribed . Attends all scheduled provider appointments . Calls pharmacy for medication refills . Performs ADL's independently . Performs IADL's independently . Calls provider office for new concerns or questions  Initial goal documentation        Plan:   Telephone follow up appointment with care management team member scheduled for: 06/04/19  Barb Merino, RN, BSN, CCM Care Management Coordinator [facility]  Direct Phone: [phone]

## 2019-05-24 NOTE — Patient Instructions (Signed)
Visit Information  Goals Addressed      Patient Stated   . COMPLETED: "I have a lot pain in my back, hips and legs" (pt-stated)       Current Barriers:  [name] Knowledge Deficits related to Self Health management for Osteoarthritis . Impaired Physical Mobility  . Poor gait/balance . Risk for self care deficit  . Risk for falls  Nurse Case Manager Clinical Goal(s):  [name] Over the next 30 days, patient will verbalize understanding of plan for in home PT . Over the next 90 days, patient will verbalize improved physical mobility and improved Self Health management of pain related to Osteoarthritis  .  CCM RN CM Interventions:  05/21/19 call completed with patient  . Evaluation of current treatment plan related to Osteoarthritis and patient's adherence to plan as established by provider . Determined patient has completed in home PT/OT to help with strengthening and balance . Determined patient found the therapy to be very effective for her back, hip and leg pain and she is adhering to her HEP as instructed by PT with ongoing effectiveness for her pain and ROM  . Discussed plans with patient for ongoing care management follow up and provided patient with direct contact information for care management team  Patient Self Care Activities:  . Attends all scheduled provider appointments . Calls pharmacy for medication refills . Attends church or other social activities . Performs ADL's independently . Performs IADL's independently . Calls provider office for new concerns or questions  Please see past updates related to this goal by clicking on the "Past Updates" button in the selected goal      . "I would like to improve my kidney function" (pt-stated)       CARE PLAN ENTRY (see longitudinal plan of care for additional care plan information)  Current Barriers:  [name] Knowledge Deficits related to disease process and Self Health prevention and management of CKD . Chronic Disease Management support  and education needs related to HTN, Chronic Renal Disease stage III, Hypertensive Nephropathy, osteoporosis  Nurse Case Manager Clinical Goal(s):  [name] Over the next 90 days, patient will work with CCM RN CM and PCP to address needs related to disease education and support to improve Self Health management of CKD   CCM RN CM Interventions:  05/21/19 call completed with patient  . Inter-disciplinary care team collaboration (see longitudinal plan of care) . Evaluation of current treatment plan related to CKD and patient's adherence to plan as established by provider . Reviewed and educated on stages of CKD, discussed most recent GFR for patient obtained on 05/19/19 is 37 . Provided education to patient [id number] water to 7-8 8 oz glasses or bottles per day unless otherwise directed by MD; limit Sodium in her diet by avoiding table salt, avoiding can foods and deli meats; educated on importance of keeping BP <130/80 consistently  . Discussed plans with patient for ongoing care management follow up and provided patient with direct contact information for care management team . Reviewed scheduled/upcoming provider appointments including: next PCP f/u with Dr. Baird Cancer scheduled for 08/16/19 @11 :19 AM . Mailed printed patient educational material; 6 Ways to be Water Wise  Patient Self Care Activities:  . Self administers medications as prescribed . Attends all scheduled provider appointments . Calls pharmacy for medication refills . Performs ADL's independently . Performs IADL's independently . Calls provider office for new concerns or questions  Initial goal documentation     . COMPLETED: "I would like to  learn more about nutrition" (pt-stated)       Current Barriers:  [name] Knowledge Deficits related to nutritional requirements for the elderly . Chronic Renal Disease Stage III  Nurse Case Manager Clinical Goal(s):  [name] Over the next 60 days, patient will work with the CCM team to address needs  related to nutrition and hydration  CCM RN CM Interventions:  05/21/19 call completed with patient and daughter [name]  . Evaluation of current treatment plan related to current nutritional/caloric intake and patient's adherence to plan as established by provider . Provided education to patient re: the importance of staying well hydrated and eating a well balanced diet; discussed age related challenges; discussed healthy food choices for the elderly . Discussed with daughter the importance of patient eating a low Sodium diet and drinking plenty of water to help manage BP and improve kidney function . Encouraged to have patient eating fresh or frozen food and to avoid processed and or canned foods . Determined patient eats more can food than her daughter would like and [name] feels her mother may benefit from having home delivered meals . Sent in basket message to embedded [place] with request to contact daughter [name] to discuss home delivered meals for patient  . Determined patient received the printed educational materials related to choosing healthy meals; Elderly nutrition 5; Discussed daughter and patient will review this information to help with nutritional guidance and will call with questions . Discussed plans with patient for ongoing care management follow up and provided patient with direct contact information for care management team  Patient Self Care Activities:  . Self administers medications as prescribed . Attends all scheduled provider appointments . Calls pharmacy for medication refills . Attends church or other social activities . Performs ADL's independently . Performs IADL's independently . Calls provider office for new concerns or questions  Please see past updates related to this goal by clicking on the "Past Updates" button in the selected goal     . "My BP often fluxerates to the high side" (pt-stated)       CARE PLAN ENTRY (see longitudinal plan of care  for additional care plan information)  Current Barriers:  [name] Knowledge Deficits related to disease process and Self Health management of HTN . Chronic Disease Management support and education needs related to HTN, Chronic Renal Disease stage III, Hypertensive Nephropathy, osteoporosis  Nurse Case Manager Clinical Goal(s):  [name] Over the next 90 days, patient will work with [place] and PCP to address needs related to disease education and support to improve Self Health management of HTN  CCM RN CM Interventions:  05/21/19 call completed with patient  . Inter-disciplinary care team collaboration (see longitudinal plan of care) . Evaluation of current treatment plan related to HTN and patient's adherence to plan as established by provider. . Provided education to patient re: potential complications of uncontrolled HTN including CKD; reviewed and discussed patient is able to monitor her BP at home and notes her BP fluctuates on the higher side; patient is unable to locate her BP log at this time; Educated patient and daughter on ways to improve Self Health management of HTN, including keeping BP <130/80 consistently, limiting Sodium, improving diet by eating fresh or frozen foods, avoiding processed foods, and or deli meats; increased water to 7-8 8 oz glasses per day unless otherwise directed by MD, staying active and participating in routine exercise, maintaining a healthy weight  . Reviewed medications with patient and discussed indication,  dosage and frequency of prescribed Hypertensive medications, patient reports understanding and adherence  . Discussed plans with patient for ongoing care management follow up and provided patient with direct contact information for care management team . Advised patient, providing education and rationale, to monitor blood pressure daily and record, calling CCM RN CM and or PCP for findings outside established parameters.  . Provided patient with printed educational  materials related to What is High Blood Pressure?; African Americans and High Blood Pressure; High Blood Pressure and Stroke; Why Should I Lower Sodium? BP Log  Patient Self Care Activities:  . Self administers medications as prescribed . Attends all scheduled provider appointments . Calls pharmacy for medication refills . Performs ADL's independently . Performs IADL's independently . Calls provider office for new concerns or questions  Initial goal documentation    . COMPLETED: "My Prolia is too expensive & I need a medication for my osteoporosis (pt-stated)       Current Barriers:  . Inability to pay high monthly co-pays  Clinical Social Work Clinical Goal(s):  [name] Over the next 45 days the patient will work with embedded PharmD to address problems related to medication costs.  CCM RN CM Interventions: 05/21/19 call completed with patient  . Evaluation of current treatment plan related to Osteoporosis and patient's adherence to plan as established by provider . Determined patient decided not to take Prolia due to fear of SE . Discussed and determined will have PCP continue to monitor her osteoporosis but will not pursue treatment at this time  . Discussed plans with patient for ongoing care management follow up and provided patient with direct contact information for care management team  Patient Self Care Activities:  . Attends all scheduled provider appointments . Calls pharmacy for medication refills . Performs ADL's independently  Please see past updates related to this goal by clicking on the "Past Updates" button in the selected goal       Other   . Neurology Referral for tremor & memory difficulty       CARE PLAN ENTRY (see longitudinal plan of care for additional care plan information)  Current Barriers:  [name] Knowledge Deficits related to evaluation and treatment for tremor and memory difficulties . Chronic Disease Management support and education needs related to HTN,  Chronic Renal Disease stage III, Hypertensive Nephropathy, osteoporosis, tremor and memory difficulties  Nurse Case Manager Clinical Goal(s):  [name] Over the next 90 days, patient will work with CCM RN CM and PCP  to address needs related to disease education and support to improve Self Health management of tremors and memory difficulties   CCM RN CM Interventions:  05/21/19 call completed with patient  . Inter-disciplinary care team collaboration (see longitudinal plan of care) . Evaluation of current treatment plan related to tremors and memory difficulties and patient's adherence to plan as established by provider . Determined PCP sent referral for Neurology for evaluation of and treatment for tremors and memory difficulties  . Discussed plans with patient for ongoing care management follow up and provided patient with direct contact information for care management team  Patient Self Care Activities:  . Self administers medications as prescribed . Attends all scheduled provider appointments . Calls pharmacy for medication refills . Performs ADL's independently . Performs IADL's independently . Calls provider office for new concerns or questions  Initial goal documentation       Patient verbalizes understanding of instructions provided today.   Telephone follow up appointment with care management team  member scheduled for: 06/04/19  Barb Merino, RN, BSN, CCM Care Management Coordinator [place] [facility]  Direct Phone: [phone]

## 2019-05-25 NOTE — Patient Instructions (Signed)
Social Worker Visit Information  Goals we discussed today:  Goals Addressed            This Visit's Progress   . Collaborate with patients daughter as requested by RN Care Manager to assist with nutritional resources       CARE PLAN ENTRY (see longitudinal plan of care for additional care plan information)  Current Barriers:  . ADL IADL limitations . Limited education about community [employer] on Wheels"* . Ongoing barriers related to ability to prepare own meals in correspondence with HTN, stage 3 renal disease, and osteoporosis  Social Work Clinical Goal(s):  [name] Over the next 15 days, patient will work with SW to address concerns related to barriers surrounding meal preparation.  CCM SW Interventions: Completed 05/24/2019 with Danielle Rankin . Inter-disciplinary care team collaboration (see longitudinal plan of care) . Collaboration with Consulting civil engineer who requests SW assistance with providing education on mobile meal programs . Successful outbound call to the patients daughter and caregiver, Dayamin Munyan . Provided education surrounding Meals on Wheels program offered by [employer] [facility] [place] . Discussed opportunity to place patient on wait list to receive services . Successful referral placed to meal program via [place] . Scheduled follow up call over the next week to confirm referral accepted  Patient Self Care Activities:  . Attends all scheduled provider appointments . Calls pharmacy for medication refills . Calls provider office for new concerns or questions . Supportive family to assist with patient care needs  Initial goal documentation'        Materials Provided: Verbal education about Meals on Wheel program provided by phone  Follow Up Plan: SW will follow up over the next week to confirm referral accepted.  Daneen Schick, BSW, CDP Social Worker, Certified Dementia Practitioner [place] [facility] [place] [facility] [phone]

## 2019-05-25 NOTE — Chronic Care Management (AMB) (Signed)
Chronic Care Management    Social Work Follow Up Note  05/24/2019 Name: Whitney Henderson MRN: [id number] DOB: 27-Apr-1928  Whitney Henderson is a 86 y.o. year old female who is a primary care patient of [name], MD. The CCM team was consulted for assistance with care coordination.   Review of patient status, including review of consultants reports, other relevant assessments, and collaboration with appropriate care team members and the patient's provider was performed as part of comprehensive patient evaluation and provision of chronic care management services.    SDOH (Social Determinants of Health) assessments performed: Yes SDOH Interventions     Most Recent Value  SDOH Interventions  SDOH Interventions for the Following Domains  Food Insecurity [Patient does not have food insecurity but family interested in supportive programs to assist with meal prep]  Food Insecurity Interventions  [id number] Referral [Referred for Meals on Wheels program]       Outpatient Encounter Medications as of 05/24/2019  Medication Sig  . Alpha-D-Galactosidase (BEANO MELTAWAYS) TABS Take 1 tablet by mouth daily as needed (gas).   [name] amLODipine (NORVASC) 2.5 MG tablet TAKE 1 TABLET(2.5 MG) BY MOUTH TWICE DAILY  . ASPIRIN 81 PO Take 81 mg by mouth as needed (for pain or headache).   . betamethasone dipropionate (DIPROLENE) 0.05 % cream APPLY THIN LAYER EXTERNALLY TO THE AFFECTED AREA EVERY DAY AS NEEDED  . cholecalciferol (VITAMIN D) 1000 units tablet Take 1,000 Units by mouth daily.   . Ginkgo Biloba 120 MG CAPS Take 1 capsule by mouth daily at 12 noon.  . Multiple Vitamins-Minerals (CENTRUM SILVER PO) Take by mouth.  . Multiple Vitamins-Minerals (PRESERVISION AREDS 2 PO) Take 1 tablet by mouth daily.   [name] Propylene Glycol (SYSTANE BALANCE OP) Place 1 drop into both eyes daily as needed (for itchy eyes). Uses when needed.   . sertraline (ZOLOFT) 25 MG tablet Take 1 tablet (25 mg total) by mouth daily.  .  Theanine 100 MG CAPS Take 1 capsule by mouth daily at 12 noon.  . vitamin C (ASCORBIC ACID) 500 MG tablet Take 500 mg by mouth daily.   No facility-administered encounter medications on file as of 05/24/2019.     Goals Addressed            This Visit's Progress   . Collaborate with patients daughter as requested by RN Care Manager to assist with nutritional resources       CARE PLAN ENTRY (see longitudinal plan of care for additional care plan information)  Current Barriers:  . ADL IADL limitations . Limited education about community [employer] on Wheels"* . Ongoing barriers related to ability to prepare own meals in correspondence with HTN, stage 3 renal disease, and osteoporosis  Social Work Clinical Goal(s):  [name] Over the next 15 days, patient will work with SW to address concerns related to barriers surrounding meal preparation.  CCM SW Interventions: Completed 05/24/2019 with Whitney Henderson . Inter-disciplinary care team collaboration (see longitudinal plan of care) . Collaboration with Consulting civil engineer who requests SW assistance with providing education on mobile meal programs . Successful outbound call to the patients daughter and caregiver, Whitney Henderson . Provided education surrounding Meals on Wheels program offered by [employer] [facility] [place] . Discussed opportunity to place patient on wait list to receive services . Successful referral placed to meal program via [place] . Scheduled follow up call over the next week to confirm referral accepted  Patient Self Care Activities:  . Attends all  scheduled provider appointments . Calls pharmacy for medication refills . Calls provider office for new concerns or questions . Supportive family to assist with patient care needs  Initial goal documentation'        Follow Up Plan: SW will follow up over the next week to confirm referral accepted.   Daneen Schick, BSW, CDP Social Worker, Certified Dementia  Practitioner [place] [facility] [place] [facility] [phone]  Total time spent performing care coordination and/or care management activities with the patient by phone or face to face = 13 minutes.

## 2019-05-31 ENCOUNTER — Telehealth: Payer: Self-pay

## 2019-05-31 ENCOUNTER — Ambulatory Visit: Payer: Self-pay

## 2019-05-31 DIAGNOSIS — N183 Chronic kidney disease, stage 3 unspecified: Secondary | ICD-10-CM

## 2019-05-31 NOTE — Chronic Care Management (AMB) (Signed)
Chronic Care Management    Social Work Follow Up Note  05/31/2019 Name: Whitney Henderson MRN: [id number] DOB: 06-09-1928  Whitney Henderson is a 86 y.o. year old female who is a primary care patient of Whitney Chard, MD. The CCM team was consulted for assistance with care coordination.   Review of patient status, including review of consultants reports, other relevant assessments, and collaboration with appropriate care team members and the patient's provider was performed as part of comprehensive patient evaluation and provision of chronic care management services.    SDOH (Social Determinants of Health) assessments performed: No    Outpatient Encounter Medications as of 05/31/2019  Medication Sig  . Alpha-D-Galactosidase (BEANO MELTAWAYS) TABS Take 1 tablet by mouth daily as needed (gas).   [name] amLODipine (NORVASC) 2.5 MG tablet TAKE 1 TABLET(2.5 MG) BY MOUTH TWICE DAILY  . ASPIRIN 81 PO Take 81 mg by mouth as needed (for pain or headache).   . betamethasone dipropionate (DIPROLENE) 0.05 % cream APPLY THIN LAYER EXTERNALLY TO THE AFFECTED AREA EVERY DAY AS NEEDED  . cholecalciferol (VITAMIN D) 1000 units tablet Take 1,000 Units by mouth daily.   . Ginkgo Biloba 120 MG CAPS Take 1 capsule by mouth daily at 12 noon.  . Multiple Vitamins-Minerals (CENTRUM SILVER PO) Take by mouth.  . Multiple Vitamins-Minerals (PRESERVISION AREDS 2 PO) Take 1 tablet by mouth daily.   [name] Propylene Glycol (SYSTANE BALANCE OP) Place 1 drop into both eyes daily as needed (for itchy eyes). Uses when needed.   . sertraline (ZOLOFT) 25 MG tablet Take 1 tablet (25 mg total) by mouth daily.  . Theanine 100 MG CAPS Take 1 capsule by mouth daily at 12 noon.  . vitamin C (ASCORBIC ACID) 500 MG tablet Take 500 mg by mouth daily.   No facility-administered encounter medications on file as of 05/31/2019.     Goals Addressed            This Visit's Progress   . COMPLETED: Collaborate with patients daughter as requested  by Whitney Henderson to assist with nutritional resources       CARE PLAN ENTRY (see longitudinal plan of care for additional care plan information)  Current Barriers:  . ADL IADL limitations . Limited education about community [employer] on Wheels"* . Ongoing barriers related to ability to prepare own meals in correspondence with HTN, stage 3 renal disease, and osteoporosis  Social Work Clinical Goal(s):  [name] Over the next 15 days, patient will work with SW to address concerns related to barriers surrounding meal preparation.  CCM SW Interventions: Completed 05/31/19 . Inter-disciplinary care team collaboration (see longitudinal plan of care) . Reviewed [id number] platform to confirm patient referral accepted into mobile meals program . Goal Met  Patient Self Care Activities:  . Attends all scheduled provider appointments . Calls pharmacy for medication refills . Calls provider office for new concerns or questions . Supportive family to assist with patient care needs  Please see past updates related to this goal by clicking on the "Past Updates" button in the selected goal         Follow Up Plan: No SW follow up planned at this time. The patient will remain active with RN Care Manager.   Whitney Henderson, BSW, CDP Social Worker, Certified Dementia Practitioner [place] [facility] [place] [facility] [phone]  Total time spent performing care coordination and/or care management activities with the patient by phone or face to face = 5 minutes.

## 2019-05-31 NOTE — Patient Instructions (Signed)
Social Worker Visit Information  Goals we discussed today:  Goals Addressed            This Visit's Progress   . COMPLETED: Collaborate with patients daughter as requested by [place] to assist with nutritional resources       CARE PLAN ENTRY (see longitudinal plan of care for additional care plan information)  Current Barriers:  . ADL IADL limitations . Limited education about community [employer] on Wheels"* . Ongoing barriers related to ability to prepare own meals in correspondence with HTN, stage 3 renal disease, and osteoporosis  Social Work Clinical Goal(s):  [name] Over the next 15 days, patient will work with SW to address concerns related to barriers surrounding meal preparation.  CCM SW Interventions: Completed 05/31/19 . Inter-disciplinary care team collaboration (see longitudinal plan of care) . Reviewed [id number] platform to confirm patient referral accepted into mobile meals program . Goal Met  Patient Self Care Activities:  . Attends all scheduled provider appointments . Calls pharmacy for medication refills . Calls provider office for new concerns or questions . Supportive family to assist with patient care needs  Please see past updates related to this goal by clicking on the "Past Updates" button in the selected goal         Follow Up Plan: No SW follow up needed at this time. The patient will remain active with RN Care Manager.   Daneen Schick, BSW, CDP Social Worker, Certified Dementia Practitioner [place] [facility] [place] [facility] [phone]

## 2019-06-04 ENCOUNTER — Telehealth: Payer: Self-pay

## 2019-06-08 ENCOUNTER — Telehealth: Payer: Self-pay

## 2019-06-08 NOTE — Telephone Encounter (Signed)
Spoke with rep from [facility] and pt is going to be responsible for cost of medication since she decided she wasn't going to take it after she agreed to have it ordered.   Spoke with daughter she did not know that mother had agreed to take this medication. She now understands why her mother is responsible for the cost.   Daughter would like to discuss with provider before any changes that may result in a cost are made with for her mother.

## 2019-07-12 ENCOUNTER — Telehealth: Payer: Self-pay

## 2019-07-12 ENCOUNTER — Ambulatory Visit (INDEPENDENT_AMBULATORY_CARE_PROVIDER_SITE_OTHER): Payer: Medicare Other

## 2019-07-12 ENCOUNTER — Other Ambulatory Visit: Payer: Self-pay

## 2019-07-12 DIAGNOSIS — N183 Chronic kidney disease, stage 3 unspecified: Secondary | ICD-10-CM | POA: Diagnosis not present

## 2019-07-12 DIAGNOSIS — I10 Essential (primary) hypertension: Secondary | ICD-10-CM

## 2019-07-12 DIAGNOSIS — R413 Other amnesia: Secondary | ICD-10-CM

## 2019-07-14 ENCOUNTER — Ambulatory Visit: Payer: Medicare Other | Admitting: Neurology

## 2019-07-14 ENCOUNTER — Other Ambulatory Visit: Payer: Self-pay

## 2019-07-14 ENCOUNTER — Encounter: Payer: Self-pay | Admitting: Neurology

## 2019-07-14 VITALS — BP 163/75 | HR 65 | Ht 61.0 in | Wt 124.0 lb

## 2019-07-14 DIAGNOSIS — R413 Other amnesia: Secondary | ICD-10-CM

## 2019-07-14 MED ORDER — DONEPEZIL HCL 5 MG PO TABS: 5.0000 mg | ORAL_TABLET | Freq: Every day | ORAL | 3 refills | Status: DC

## 2019-07-14 NOTE — Patient Instructions (Signed)
Visit Information  Goals Addressed      Patient Stated   .  "I would like to improve my kidney function" (pt-stated)   On track     [place] (see longitudinal plan of care for additional care plan information)  Current Barriers:  [name] Knowledge Deficits related to disease process and Self Health prevention and management of CKD . Chronic Disease Management support and education needs related to HTN, Chronic Renal Disease stage III, Hypertensive Nephropathy, osteoporosis  Nurse Case Manager Clinical Goal(s):  [name] Over the next 90 days, patient will work with CCM RN CM and PCP to address needs related to disease education and support to improve Self Health management of CKD   CCM RN CM Interventions:  07/13/19 call completed with patient  . Inter-disciplinary care team collaboration (see longitudinal plan of care) . Evaluation of current treatment plan related to CKD and patient's adherence to plan as established by provider . Reviewed and educated on stages of CKD, discussed most recent GFR for patient obtained on 05/19/19 is 37 . Provided education to patient [id number] water to 7-8 8 oz glasses or bottles per day unless otherwise directed by MD; limit Sodium in her diet by avoiding table salt, avoiding can foods and deli meats; educated on importance of keeping BP <130/80 consistently  . Discussed PCP previous recommendation for Nephrology referral, patient is agreeable . Determined per chart review Nephrology referral has not been entered . Sent in basket message to Dr. Baird Cancer advising patient is agreeable to Nephrology referral  . Discussed plans with patient for ongoing care management follow up and provided patient with direct contact information for care management team . Reviewed scheduled/upcoming provider appointments including: next PCP f/u with Dr. Baird Cancer scheduled for 08/16/19 @11 :68 AM  Patient Self Care Activities:  . Self administers medications as prescribed . Attends  all scheduled provider appointments . Calls pharmacy for medication refills . Performs ADL's independently . Performs IADL's independently . Calls provider office for new concerns or questions  Please see past updates related to this goal by clicking on the "Past Updates" button in the selected goal      .  "My BP often fluxerates to the high side" (pt-stated)   On track     [place] (see longitudinal plan of care for additional care plan information)  Current Barriers:  [name] Knowledge Deficits related to disease process and Self Health management of HTN . Chronic Disease Management support and education needs related to HTN, Chronic Renal Disease stage III, Hypertensive Nephropathy, osteoporosis  Nurse Case Manager Clinical Goal(s):  [name] Over the next 90 days, patient will work with [place] and PCP to address needs related to disease education and support to improve Self Health management of HTN  CCM RN CM Interventions:  07/13/19 call completed with patient  . Inter-disciplinary care team collaboration (see longitudinal plan of care) . Evaluation of current treatment plan related to HTN and patient's adherence to plan as established by provider. . Reinforced education to patient re: potential complications of uncontrolled HTN; reviewed and discussed most recent recorded home BP readings;  o 6/09 102/65  o 6/12 112/59 o 6/20 112/64 o 6/14 128/78 o 6/22 126/73 . Discussed the recorded BP readings are within target range and positive reinforcement was provided to patient for adhering to MD recommendations and Self monitoring  . Reinforced importance of limiting Sodium, and improving diet by eating fresh or frozen foods, avoiding processed foods, and or deli  meats; increased water to 7-8 8 oz glasses per day unless otherwise directed by MD, staying active and participating in routine exercise, maintaining a healthy weight  . Reviewed medications with patient and discussed indication,  dosage and frequency of prescribed Hypertensive medications, patient reports understanding and adherence  . Advised patient, providing education and rationale, to monitor blood pressure daily and record, calling CCM RN CM and or PCP for findings outside established parameters . Discussed plans with patient for ongoing care management follow up and provided patient with direct contact information for care management team  Patient Self Care Activities:  . Self administers medications as prescribed . Attends all scheduled provider appointments . Calls pharmacy for medication refills . Performs ADL's independently . Performs IADL's independently . Calls provider office for new concerns or questions  Please see past updates related to this goal by clicking on the "Past Updates" button in the selected goal        Other   .  Neurology Referral for tremor & memory difficulty   On track     [place] (see longitudinal plan of care for additional care plan information)  Current Barriers:  [name] Knowledge Deficits related to evaluation and treatment for tremor and memory difficulties . Chronic Disease Management support and education needs related to HTN, Chronic Renal Disease stage III, Hypertensive Nephropathy, osteoporosis, tremor and memory difficulties  Nurse Case Manager Clinical Goal(s):  [name] Over the next 90 days, patient will work with CCM RN CM and PCP  to address needs related to disease education and support to improve Self Health management of tremors and memory difficulties   CCM RN CM Interventions:  07/13/19 call completed with patient  . Inter-disciplinary care team collaboration (see longitudinal plan of care) . Evaluation of current treatment plan related to tremors and memory difficulties and patient's adherence to plan as established by provider . Determined patient has a new patient appointment scheduled with Neurology, Dr. Jannifer Franklin set for 07/14/19 @11 :30 AM for evaluation of  memory impairment and tremor . Discussed plans with patient for ongoing care management follow up and provided patient with direct contact information for care management team 07/14/19 Per review of completed Neuro visit with Dr. Jannifer Franklin, the following Assessment/Plan is noted; . Assessment/Plan: . 1.  Memory disturbance . 2.  Resting tremor, right upper extremity . 3.  Left frontal headache . The patient does have a resting tremor but she has no other findings suggestive of parkinsonism.  She will need to be followed over time for this.  The patient will be started on low-dose Aricept for the memory issues, she will start 5 mg at night.  She will follow-up here in 6 months. Jill Alexanders MD . 07/14/2019 11:19 AM . [facility] . [address] [place], [place] [address]  Patient Self Care Activities:  . Self administers medications as prescribed . Attends all scheduled provider appointments . Calls pharmacy for medication refills . Performs ADL's independently . Performs IADL's independently . Calls provider office for new concerns or questions  Please see past updates related to this goal by clicking on the "Past Updates" button in the selected goal         Patient verbalizes understanding of instructions provided today.   Telephone follow up appointment with care management team member scheduled for: 08/24/19  Barb Merino, RN, BSN, CCM Care Management Coordinator [facility]  Direct Phone: [phone]

## 2019-07-14 NOTE — Progress Notes (Addendum)
Reason for visit: Memory disturbance, tremor  Referring physician: Dr. Lucien Mons Whitney Henderson is a 86 y.o. female  History of present illness:  Whitney Henderson is an 86 year old right-handed black female with a history of headaches affecting the left brow with a sharp quality that would come and go.  The patient has also been seen in the past for some troubles with memory, last seen about 3 years ago.  The patient comes in with a new problem with a resting tremor that developed on the right arm sometime within the last 2 years.  The patient comes in with her daughter today.  The patient denies any significant change in her walking, she has not had any falls.  She has not had any change in speech.  She does note that her handwriting is somewhat sloppy.  She still has occasional headaches.  She denies any significant changes in vision.  She currently lives alone but is not driving.  She still handles some of her finances, and keeps up with medications and appointments.  The daughter indicates that she is somewhat forgetful.  She returns to the office today for further evaluation.  Past Medical History:  Diagnosis Date  . Anxiety   . Cataracts, bilateral   . Headache 04/27/2015   Left>right periorbital  . Hypertension   . Memory difficulties 09/04/2016    Past Surgical History:  Procedure Laterality Date  . ABDOMINAL HYSTERECTOMY    . BREAST EXCISIONAL BIOPSY Left over 81 ys ago   benign  . CHOLECYSTECTOMY    . EXCISIONAL HEMORRHOIDECTOMY      Family History  Problem Relation Age of Onset  . Early death Mother   . Cancer Father   . Breast cancer Daughter        47s    Social history:  reports that she has quit smoking. Her smoking use included cigarettes. She quit after 2.00 years of use. She has never used smokeless tobacco. She reports that she does not drink alcohol and does not use drugs.  Medications:  Prior to Admission medications   Medication Sig Start Date End Date  Taking? Authorizing Provider  Alpha-D-Galactosidase (BEANO MELTAWAYS) TABS Take 1 tablet by mouth daily as needed (gas).    Yes [provider]  amLODipine (NORVASC) 2.5 MG tablet TAKE 1 TABLET(2.5 MG) BY MOUTH TWICE DAILY 05/04/19  Yes [name], MD  Apoaequorin (PREVAGEN PO) Take by mouth daily at 6 (six) AM.   Yes [provider]  ASPIRIN 81 PO Take 81 mg by mouth as needed (for pain or headache).    Yes [provider]  betamethasone dipropionate (DIPROLENE) 0.05 % cream APPLY THIN LAYER EXTERNALLY TO THE AFFECTED AREA EVERY DAY AS NEEDED 05/25/18  Yes Minette Brine, FNP  cholecalciferol (VITAMIN D) 1000 units tablet Take 1,000 Units by mouth daily.    Yes [provider]  Ginkgo Biloba 120 MG CAPS Take 1 capsule by mouth daily at 12 noon.    Yes [provider]  Multiple Vitamins-Minerals (CENTRUM SILVER PO) Take by mouth.   Yes [provider]  Multiple Vitamins-Minerals (PRESERVISION AREDS 2 PO) Take 1 tablet by mouth daily.    Yes [provider]  Propylene Glycol (SYSTANE BALANCE OP) Place 1 drop into both eyes daily as needed (for itchy eyes). Uses when needed.    Yes [provider]  sertraline (ZOLOFT) 25 MG tablet Take 1 tablet (25 mg total) by mouth daily. 05/19/19 05/18/20 Yes  [name], MD  Theanine 100 MG CAPS Take 1 capsule by mouth daily at 12 noon.   Yes [provider]  vitamin C (ASCORBIC ACID) 500 MG tablet Take 500 mg by mouth daily.   Yes [provider]      Allergies  Allergen Reactions  . Codeine Rash    ROS:  Out of a complete 14 system review of symptoms, the patient complains only of the following symptoms, and all other reviewed systems are negative.  Memory problems Right hand tremor Headache Blurred vision Feeling cold Hearing loss Allergies Generalized weakness Anxiety  Blood pressure (!) 163/75, pulse 65, height 5\' 1"  (1.549 m), weight 124 lb (56.2  kg).  Physical Exam  General: The patient is alert and cooperative at the time of the examination.  Eyes: Pupils are equal, round, and reactive to light. Discs are flat bilaterally.  Neck: The neck is supple, no carotid bruits are noted.  Respiratory: The respiratory examination is clear.  Cardiovascular: The cardiovascular examination reveals a regular rate and rhythm, no obvious murmurs or rubs are noted.  Skin: Extremities are without significant edema.  Neurologic Exam  Mental status: The patient is alert and oriented x 3 at the time of the examination. The Mini-Mental status examination done today shows a total score of 24/30.  Cranial nerves: Facial symmetry is present. There is good sensation of the face to pinprick and soft touch bilaterally. The strength of the facial muscles and the muscles to head turning and shoulder shrug are normal bilaterally. Speech is well enunciated, no aphasia or dysarthria is noted. Extraocular movements are full. Visual fields are full. The tongue is midline, and the patient has symmetric elevation of the soft palate. No obvious hearing deficits are noted.  Motor: The motor testing reveals 5 over 5 strength of all 4 extremities. Good symmetric motor tone is noted throughout.  Sensory: Sensory testing is intact to pinprick, soft touch, vibration sensation, and position sense on all 4 extremities. No evidence of extinction is noted.  Coordination: Cerebellar testing reveals good finger-nose-finger bilaterally.  The patient has apraxia when trying to perform heel-to-shin bilaterally.  Occasional mild resting tremor seen with the right upper extremity.  Gait and station: Gait is normal. Tandem gait is slightly unsteady. Romberg is negative. No drift is seen.  Reflexes: Deep tendon reflexes are symmetric and normal bilaterally. Toes are downgoing bilaterally.   Assessment/Plan:  1.  Memory disturbance  2.  Resting tremor, right upper  extremity  3.  Left frontal headache  The patient does have a resting tremor but she has no other findings suggestive of parkinsonism.  She will need to be followed over time for this.  The patient will be started on low-dose Aricept for the memory issues, she will start 5 mg at night.  She will follow-up here in 6 months.  Jill Alexanders MD 07/14/2019 11:19 AM  [facility] [address] [place],  [address]  Phone [phone] Fax [phone]

## 2019-07-14 NOTE — Patient Instructions (Signed)
We will start Aricept at night for memory.  Begin Aricept (donepezil) at 5 mg at night for one month. If this medication is well-tolerated, please call our office and we will call in a prescription for the 10 mg tablets. Look out for side effects that may include nausea, diarrhea, weight loss, or stomach cramps. This medication will also cause a runny nose, therefore there is no need for allergy medications for this purpose.

## 2019-07-14 NOTE — Chronic Care Management (AMB) (Signed)
Chronic Care Management   Follow Up Note   07/13/2019 Name: Whitney Henderson MRN: [id number] DOB: 1928/12/05  Referred by: [name], MD Reason for referral : Chronic Care Management (FU RN CM Call - BP)   Whitney Henderson is a 86 y.o. year old female who is a primary care patient of [name], MD. The CCM team was consulted for assistance with chronic disease management and care coordination needs.    Review of patient status, including review of consultants reports, relevant laboratory and other test results, and collaboration with appropriate care team members and the patient's provider was performed as part of comprehensive patient evaluation and provision of chronic care management services.    SDOH (Social Determinants of Health) assessments performed: Yes - no acute needs  See Care Plan activities for detailed interventions related to [place])   Placed outbound call to patient for a CCM RN CM care plan follow up.     Outpatient Encounter Medications as of 07/12/2019  Medication Sig  . Apoaequorin (PREVAGEN PO) Take by mouth daily at 6 (six) AM.  . Theanine 100 MG CAPS Take 1 capsule by mouth daily at 12 noon.  . Alpha-D-Galactosidase (BEANO MELTAWAYS) TABS Take 1 tablet by mouth daily as needed (gas).   [name] amLODipine (NORVASC) 2.5 MG tablet TAKE 1 TABLET(2.5 MG) BY MOUTH TWICE DAILY  . ASPIRIN 81 PO Take 81 mg by mouth as needed (for pain or headache).   . betamethasone dipropionate (DIPROLENE) 0.05 % cream APPLY THIN LAYER EXTERNALLY TO THE AFFECTED AREA EVERY DAY AS NEEDED  . cholecalciferol (VITAMIN D) 1000 units tablet Take 1,000 Units by mouth daily.   . Ginkgo Biloba 120 MG CAPS Take 1 capsule by mouth daily at 12 noon.   . Multiple Vitamins-Minerals (CENTRUM SILVER PO) Take by mouth.  . Multiple Vitamins-Minerals (PRESERVISION AREDS 2 PO) Take 1 tablet by mouth daily.   [name] Propylene Glycol (SYSTANE BALANCE OP) Place 1 drop into both eyes daily as needed (for itchy  eyes). Uses when needed.   . sertraline (ZOLOFT) 25 MG tablet Take 1 tablet (25 mg total) by mouth daily.  . vitamin C (ASCORBIC ACID) 500 MG tablet Take 500 mg by mouth daily.   No facility-administered encounter medications on file as of 07/12/2019.     Objective:  No results found for: HGBA1C Lab Results  Component Value Date   MICROALBUR 10 02/18/2019   LDLCALC 103 (H) 11/05/2018   CREATININE 1.44 (H) 05/19/2019   BP Readings from Last 3 Encounters:  07/14/19 (!) 163/75  05/19/19 (!) 144/76  02/18/19 134/72    Goals Addressed      Patient Stated   .  "I would like to improve my kidney function" (pt-stated)   On track     [place] (see longitudinal plan of care for additional care plan information)  Current Barriers:  [name] Knowledge Deficits related to disease process and Self Health prevention and management of CKD . Chronic Disease Management support and education needs related to HTN, Chronic Renal Disease stage III, Hypertensive Nephropathy, osteoporosis  Nurse Case Manager Clinical Goal(s):  [name] Over the next 90 days, patient will work with CCM RN CM and PCP to address needs related to disease education and support to improve Self Health management of CKD   CCM RN CM Interventions:  07/13/19 call completed with patient  . Inter-disciplinary care team collaboration (see longitudinal plan of care) . Evaluation of current treatment plan related to CKD  and patient's adherence to plan as established by provider . Reviewed and educated on stages of CKD, discussed most recent GFR for patient obtained on 05/19/19 is 37 . Provided education to patient [id number] water to 7-8 8 oz glasses or bottles per day unless otherwise directed by MD; limit Sodium in her diet by avoiding table salt, avoiding can foods and deli meats; educated on importance of keeping BP <130/80 consistently  . Discussed PCP previous recommendation for Nephrology referral, patient is  agreeable . Determined per chart review Nephrology referral has not been entered . Sent in basket message to Dr. Baird Cancer advising patient is agreeable to Nephrology referral  . Discussed plans with patient for ongoing care management follow up and provided patient with direct contact information for care management team . Reviewed scheduled/upcoming provider appointments including: next PCP f/u with Dr. Baird Cancer scheduled for 08/16/19 @11 :2 AM  Patient Self Care Activities:  . Self administers medications as prescribed . Attends all scheduled provider appointments . Calls pharmacy for medication refills . Performs ADL's independently . Performs IADL's independently . Calls provider office for new concerns or questions  Please see past updates related to this goal by clicking on the "Past Updates" button in the selected goal      .  "My BP often fluxerates to the high side" (pt-stated)   On track     [place] (see longitudinal plan of care for additional care plan information)  Current Barriers:  [name] Knowledge Deficits related to disease process and Self Health management of HTN . Chronic Disease Management support and education needs related to HTN, Chronic Renal Disease stage III, Hypertensive Nephropathy, osteoporosis  Nurse Case Manager Clinical Goal(s):  [name] Over the next 90 days, patient will work with [place] and PCP to address needs related to disease education and support to improve Self Health management of HTN  CCM RN CM Interventions:  07/13/19 call completed with patient  . Inter-disciplinary care team collaboration (see longitudinal plan of care) . Evaluation of current treatment plan related to HTN and patient's adherence to plan as established by provider. . Reinforced education to patient re: potential complications of uncontrolled HTN; reviewed and discussed most recent recorded home BP readings;  o 6/09 102/65  o 6/12 112/59 o 6/20 112/64 o 6/14 128/78 o 6/22  126/73 . Discussed the recorded BP readings are within target range and positive reinforcement was provided to patient for adhering to MD recommendations and Self monitoring  . Reinforced importance of limiting Sodium, and improving diet by eating fresh or frozen foods, avoiding processed foods, and or deli meats; increased water to 7-8 8 oz glasses per day unless otherwise directed by MD, staying active and participating in routine exercise, maintaining a healthy weight  . Reviewed medications with patient and discussed indication, dosage and frequency of prescribed Hypertensive medications, patient reports understanding and adherence  . Advised patient, providing education and rationale, to monitor blood pressure daily and record, calling CCM RN CM and or PCP for findings outside established parameters . Discussed plans with patient for ongoing care management follow up and provided patient with direct contact information for care management team  Patient Self Care Activities:  . Self administers medications as prescribed . Attends all scheduled provider appointments . Calls pharmacy for medication refills . Performs ADL's independently . Performs IADL's independently . Calls provider office for new concerns or questions  Please see past updates related to this goal by clicking on the "Past Updates" button  in the selected goal        Other   .  Neurology Referral for tremor & memory difficulty   On track     [place] (see longitudinal plan of care for additional care plan information)  Current Barriers:  [name] Knowledge Deficits related to evaluation and treatment for tremor and memory difficulties . Chronic Disease Management support and education needs related to HTN, Chronic Renal Disease stage III, Hypertensive Nephropathy, osteoporosis, tremor and memory difficulties  Nurse Case Manager Clinical Goal(s):  [name] Over the next 90 days, patient will work with CCM RN CM and PCP  to  address needs related to disease education and support to improve Self Health management of tremors and memory difficulties   CCM RN CM Interventions:  07/13/19 call completed with patient  . Inter-disciplinary care team collaboration (see longitudinal plan of care) . Evaluation of current treatment plan related to tremors and memory difficulties and patient's adherence to plan as established by provider . Determined patient has a new patient appointment scheduled with Neurology, Dr. Jannifer Franklin set for 07/14/19 @11 :30 AM for evaluation of memory impairment and tremor . Discussed plans with patient for ongoing care management follow up and provided patient with direct contact information for care management team 07/14/19 Per review of completed Neuro visit with Dr. Jannifer Franklin, the following Assessment/Plan is noted; . Assessment/Plan: . 1.  Memory disturbance . 2.  Resting tremor, right upper extremity . 3.  Left frontal headache . The patient does have a resting tremor but she has no other findings suggestive of parkinsonism.  She will need to be followed over time for this.  The patient will be started on low-dose Aricept for the memory issues, she will start 5 mg at night.  She will follow-up here in 6 months. Jill Alexanders MD . 07/14/2019 11:19 AM . [facility] . [address] [place], [place] [address]  Patient Self Care Activities:  . Self administers medications as prescribed . Attends all scheduled provider appointments . Calls pharmacy for medication refills . Performs ADL's independently . Performs IADL's independently . Calls provider office for new concerns or questions  Please see past updates related to this goal by clicking on the "Past Updates" button in the selected goal        Plan:   Telephone follow up appointment with care management team member scheduled for: 08/24/19  Barb Merino, RN, BSN, CCM Care Management Coordinator [place] [facility]  Direct Phone: [phone]

## 2019-08-16 ENCOUNTER — Ambulatory Visit (INDEPENDENT_AMBULATORY_CARE_PROVIDER_SITE_OTHER): Payer: Medicare Other | Admitting: Internal Medicine

## 2019-08-16 ENCOUNTER — Other Ambulatory Visit: Payer: Self-pay

## 2019-08-16 ENCOUNTER — Encounter: Payer: Self-pay | Admitting: Internal Medicine

## 2019-08-16 VITALS — BP 118/74 | HR 68 | Temp 98.0°F | Ht 61.0 in | Wt 125.6 lb

## 2019-08-16 DIAGNOSIS — Z6823 Body mass index (BMI) 23.0-23.9, adult: Secondary | ICD-10-CM | POA: Diagnosis not present

## 2019-08-16 DIAGNOSIS — N183 Chronic kidney disease, stage 3 unspecified: Secondary | ICD-10-CM | POA: Diagnosis not present

## 2019-08-16 DIAGNOSIS — N644 Mastodynia: Secondary | ICD-10-CM

## 2019-08-16 DIAGNOSIS — R413 Other amnesia: Secondary | ICD-10-CM

## 2019-08-16 DIAGNOSIS — I10 Essential (primary) hypertension: Secondary | ICD-10-CM | POA: Diagnosis not present

## 2019-08-16 MED ORDER — CYANOCOBALAMIN 1000 MCG/ML IJ SOLN
1000.0000 ug | Freq: Once | INTRAMUSCULAR | Status: AC
  Administered 2019-08-16: 1000 ug via INTRAMUSCULAR

## 2019-08-16 NOTE — Progress Notes (Signed)
I,Katawbba Wiggins,acting as a Neurosurgeon for [name], MD.,have documented all relevant documentation on the behalf of [name], MD,as directed by  [name], MD while in the presence of [name], MD.  This visit occurred during the SARS-CoV-2 public health emergency.  Safety protocols were in place, including screening questions prior to the visit, additional usage of staff PPE, and extensive cleaning of exam room while observing appropriate contact time as indicated for disinfecting solutions.  Subjective:     Patient ID: Whitney Henderson , female    DOB: [birth year] , 86 y.o.   MRN: [id number]   Chief Complaint  Patient presents with  . Hypertension    HPI  She presents for BP check. She reports compliance with meds. She states her home BP readings are controlled as well. Denies headaches, visual disturbances and nausea.   Hypertension This is a chronic problem. The current episode started more than 1 year ago. The problem has been gradually improving since onset. The problem is uncontrolled. Pertinent negatives include no blurred vision, chest pain, headaches, neck pain, orthopnea, palpitations or shortness of breath. The current treatment provides moderate improvement. Compliance problems include exercise.      Past Medical History:  Diagnosis Date  . Anxiety   . Cataracts, bilateral   . Discoid lupus   . Headache 04/27/2015   Left>right periorbital  . Hypertension   . Memory difficulties 09/04/2016     Family History  Problem Relation Age of Onset  . Early death Mother   . Cancer Father   . Breast cancer Daughter        23s     Current Outpatient Medications:  .  amLODipine (NORVASC) 2.5 MG tablet, TAKE 1 TABLET(2.5 MG) BY MOUTH TWICE DAILY, Disp: 180 tablet, Rfl: 1 .  Apoaequorin (PREVAGEN PO), Take by mouth daily at 6 (six) AM., Disp: , Rfl:  .  ASPIRIN 81 PO, Take 81 mg by mouth as needed (for pain or headache). , Disp: , Rfl:  .   betamethasone dipropionate (DIPROLENE) 0.05 % cream, APPLY THIN LAYER EXTERNALLY TO THE AFFECTED AREA EVERY DAY AS NEEDED, Disp: 60 g, Rfl: 2 .  Calcium Carb-Cholecalciferol (CALCIUM 500 +D PO), Take by mouth. With 400 mg of vitd Doesn't take every day, only sometimes, Disp: , Rfl:  .  donepezil (ARICEPT) 5 MG tablet, Take 1 tablet (5 mg total) by mouth at bedtime., Disp: 30 tablet, Rfl: 3 .  Multiple Vitamins-Minerals (CENTRUM SILVER PO), Take by mouth., Disp: , Rfl:  .  Multiple Vitamins-Minerals (PRESERVISION AREDS 2 PO), Take 1 tablet by mouth daily. , Disp: , Rfl:  .  Propylene Glycol (SYSTANE BALANCE OP), Place 1 drop into both eyes daily as needed (for itchy eyes). Uses when needed. , Disp: , Rfl:  .  sertraline (ZOLOFT) 25 MG tablet, Take 1 tablet (25 mg total) by mouth daily., Disp: 90 tablet, Rfl: 1 .  Theanine 100 MG CAPS, Take 1 capsule by mouth daily at 12 noon., Disp: , Rfl:  .  vitamin C (ASCORBIC ACID) 500 MG tablet, Take 500 mg by mouth daily., Disp: , Rfl:  .  Alpha-D-Galactosidase (BEANO MELTAWAYS) TABS, Take 1 tablet by mouth daily as needed (gas).  (Patient not taking: Reported on 08/16/2019), Disp: , Rfl:  .  cholecalciferol (VITAMIN D) 1000 units tablet, Take 1,000 Units by mouth daily.  (Patient not taking: Reported on 08/16/2019), Disp: , Rfl:  .  Ginkgo Biloba 120 MG CAPS,  Take 1 capsule by mouth daily at 12 noon.  (Patient not taking: Reported on 08/16/2019), Disp: , Rfl:    Allergies  Allergen Reactions  . Codeine Rash     Review of Systems  Constitutional: Negative.   Eyes: Negative for blurred vision.  Respiratory: Negative.  Negative for shortness of breath.   Cardiovascular: Negative.  Negative for chest pain, palpitations and orthopnea.  Gastrointestinal: Negative.   Genitourinary:       She c/o left breast pain. Sx have been going on for several weeks. Denies nipple discharge. Unable to describe the pain. Comes and goes. Unable to identify what triggers the  pain.   Musculoskeletal: Negative for neck pain.  Neurological: Negative.  Negative for headaches.       She reports being prescribed medication by neurologist for her memory. States she has not been taking regularly b/c it "makes her sick". She has been taking at bedtime.   Psychiatric/Behavioral: Negative.      Today's Vitals   08/16/19 1159  BP: 118/74  Pulse: 68  Temp: 98 F (36.7 C)  TempSrc: Oral  Weight: 125 lb 9.6 oz (57 kg)  Height: _0  (1.549 m)  PainSc: 0-No pain   Body mass index is 23.73 kg/m.  Wt Readings from Last 3 Encounters:  08/16/19 125 lb 9.6 oz (57 kg)  07/14/19 124 lb (56.2 kg)  05/19/19 126 lb 9.6 oz (57.4 kg)   Objective:  Physical Exam Vitals and nursing note reviewed.  Constitutional:      Appearance: Normal appearance.  HENT:     Head: Normocephalic and atraumatic.  Cardiovascular:     Rate and Rhythm: Normal rate and regular rhythm.     Heart sounds: Normal heart sounds.  Pulmonary:     Effort: Pulmonary effort is normal.     Breath sounds: Normal breath sounds.  Chest:     Breasts: Tanner Score is 5.       Comments: Healed surgical scars. Point tenderness. No mass palpated. No nipple discharge.  Skin:    General: Skin is warm.  Neurological:     General: No focal deficit present.     Mental Status: She is alert.  Psychiatric:        Mood and Affect: Mood normal.        Behavior: Behavior normal.         Assessment And Plan:     1. Hypertensive nephropathy Comments: Chronic, well controlled. She will continue with current meds for now. Encouraged to avoid adding salt to her foods. I will check renal function today.   - BMP8+EGFR  2. Stage 3 chronic kidney disease, unspecified whether stage 3a or 3b CKD Comments: Chronic, this has been stable. I will recheck renal function today. The importance of adequate hydration and maintenance of optimal BP to prevent progression of CKD was discussed with the patient in detail. Her  daughter was also contacted via phone during the visit. She also had another daughter on the phone, who lives in [place], who had some questions. All questions were answered to their satisfaction.   3. Mastodynia of left breast  I will schedule her for diagnostic mammogram of left breast. I will place order for u/s; in case radiology thinks it is feasible.   - [place] Unlisted Procedure Breast; Future - MM Digital Diagnostic Unilat L; Future  4. Memory difficulties  Chronic, encouraged to take donepezil daily. However, advised to take with dinner, instead of bedtime since this is  making her sick. She is encouraged to contact me next week to let me know if she has improved tolerance with this. I will check vitamin B12 level and I she was also given vitamin B12 IM x 1.   - Vitamin B12 - cyanocobalamin ((VITAMIN B-12)) injection 1,000 mcg  5. BMI 23.0-23.9, adult   Her weight is stable. She is encouraged to engage in weight-bearing exercise at least 2 - 3 days per week.   Patient was given opportunity to ask questions. Patient verbalized understanding of the plan and was able to repeat key elements of the plan. All questions were answered to their satisfaction.  Maximino Greenland, MD   I, Maximino Greenland, MD, have reviewed all documentation for this visit. The documentation on 08/16/19 for the exam, diagnosis, procedures, and orders are all accurate and complete.   THE PATIENT IS ENCOURAGED TO PRACTICE SOCIAL DISTANCING DUE TO THE COVID-19 PANDEMIC.

## 2019-08-16 NOTE — Patient Instructions (Signed)

## 2019-08-17 LAB — BMP8+EGFR
BUN/Creatinine Ratio: 21 (ref 12–28)
BUN: 29 mg/dL — ABNORMAL HIGH (ref 8–27)
CO2: 22 mmol/L (ref 20–29)
Calcium: 9.4 mg/dL (ref 8.7–10.3)
Chloride: 101 mmol/L (ref 96–106)
Creatinine, Ser: 1.35 mg/dL — ABNORMAL HIGH (ref 0.57–1.00)
GFR calc Af Amer: 40 mL/min/{1.73_m2} — ABNORMAL LOW (ref >59)
GFR calc non Af Amer: 35 mL/min/{1.73_m2} — ABNORMAL LOW (ref >59)
Glucose: 102 mg/dL — ABNORMAL HIGH (ref 65–99)
Potassium: 4.4 mmol/L (ref 3.5–5.2)
Sodium: 139 mmol/L (ref 134–144)

## 2019-08-17 LAB — VITAMIN B12: Vitamin B-12: 752 pg/mL (ref 232–1245)

## 2019-08-24 ENCOUNTER — Ambulatory Visit (INDEPENDENT_AMBULATORY_CARE_PROVIDER_SITE_OTHER): Payer: Medicare Other

## 2019-08-24 ENCOUNTER — Other Ambulatory Visit: Payer: Self-pay

## 2019-08-24 ENCOUNTER — Telehealth: Payer: Medicare Other

## 2019-08-24 DIAGNOSIS — I10 Essential (primary) hypertension: Secondary | ICD-10-CM

## 2019-08-24 DIAGNOSIS — R413 Other amnesia: Secondary | ICD-10-CM

## 2019-08-24 DIAGNOSIS — N183 Chronic kidney disease, stage 3 unspecified: Secondary | ICD-10-CM

## 2019-08-24 DIAGNOSIS — M810 Age-related osteoporosis without current pathological fracture: Secondary | ICD-10-CM | POA: Diagnosis not present

## 2019-08-25 NOTE — Chronic Care Management (AMB) (Signed)
Chronic Care Management   Follow Up Note   08/25/2019 Name: Whitney Henderson MRN: [id number] DOB: 01/23/1928  Referred by: [name], MD Reason for referral : No chief complaint on file.   Whitney Henderson is a 86 y.o. year old female who is a primary care patient of [name], MD. The CCM team was consulted for assistance with chronic disease management and care coordination needs.    Review of patient status, including review of consultants reports, relevant laboratory and other test results, and collaboration with appropriate care team members and the patient's provider was performed as part of comprehensive patient evaluation and provision of chronic care management services.    SDOH (Social Determinants of Health) assessments performed: Yes - no acute challenges noted  See Care Plan activities for detailed interventions related to [place])   Placed outbound call to patient for a CCM RN CM care plan update.     Outpatient Encounter Medications as of 08/24/2019  Medication Sig  . Alpha-D-Galactosidase (BEANO MELTAWAYS) TABS Take 1 tablet by mouth daily as needed (gas).  (Patient not taking: Reported on 08/16/2019)  . amLODipine (NORVASC) 2.5 MG tablet TAKE 1 TABLET(2.5 MG) BY MOUTH TWICE DAILY  . Apoaequorin (PREVAGEN PO) Take by mouth daily at 6 (six) AM.  . ASPIRIN 81 PO Take 81 mg by mouth as needed (for pain or headache).   . betamethasone dipropionate (DIPROLENE) 0.05 % cream APPLY THIN LAYER EXTERNALLY TO THE AFFECTED AREA EVERY DAY AS NEEDED  . Calcium Carb-Cholecalciferol (CALCIUM 500 +D PO) Take by mouth. With 400 mg of vitd Doesn't take every day, only sometimes  . cholecalciferol (VITAMIN D) 1000 units tablet Take 1,000 Units by mouth daily.  (Patient not taking: Reported on 08/16/2019)  . donepezil (ARICEPT) 5 MG tablet Take 1 tablet (5 mg total) by mouth at bedtime.  . Ginkgo Biloba 120 MG CAPS Take 1 capsule by mouth daily at 12 noon.  (Patient not taking: Reported on  08/16/2019)  . Multiple Vitamins-Minerals (CENTRUM SILVER PO) Take by mouth.  . Multiple Vitamins-Minerals (PRESERVISION AREDS 2 PO) Take 1 tablet by mouth daily.   Whitney Henderson Propylene Glycol (SYSTANE BALANCE OP) Place 1 drop into both eyes daily as needed (for itchy eyes). Uses when needed.   . sertraline (ZOLOFT) 25 MG tablet Take 1 tablet (25 mg total) by mouth daily.  . Theanine 100 MG CAPS Take 1 capsule by mouth daily at 12 noon.  . vitamin C (ASCORBIC ACID) 500 MG tablet Take 500 mg by mouth daily.   No facility-administered encounter medications on file as of 08/24/2019.     Objective:  No results found for: HGBA1C Lab Results  Component Value Date   MICROALBUR 10 02/18/2019   LDLCALC 103 (H) 11/05/2018   CREATININE 1.35 (H) 08/16/2019   BP Readings from Last 3 Encounters:  08/16/19 118/74  07/14/19 (!) 163/75  05/19/19 (!) 144/76    Goals Addressed      Patient Stated   .  "I would like to improve my kidney function" (pt-stated)   On track     [place] (see longitudinal plan of care for additional care plan information)  Current Barriers:  Whitney Henderson Knowledge Deficits related to disease process and Self Health prevention and management of CKD . Chronic Disease Management support and education needs related to HTN, Chronic Renal Disease stage III, Hypertensive Nephropathy, osteoporosis  Nurse Case Manager Clinical Goal(s):  Whitney Henderson Over the next 90 days, patient will work with  CCM RN CM and PCP to address needs related to disease education and support to improve Self Health management of CKD   CCM RN CM Interventions:  08/24/19 call completed with patient  . Inter-disciplinary care team collaboration (see longitudinal plan of care) . Evaluation of current treatment plan related to CKD and patient's adherence to plan as established by provider . Reviewed and discussed patients recent GFR; Re-educated on importance of increasing water to 7-8 8 oz glasses or bottles per day unless  otherwise directed by MD; limit Sodium in her diet by avoiding table salt, avoiding can foods and deli meats; educated on importance of keeping BP <130/80 consistently  . Discussed plans with patient for ongoing care management follow up and provided patient with direct contact information for care management team  Patient Self Care Activities:  . Self administers medications as prescribed . Attends all scheduled provider appointments . Calls pharmacy for medication refills . Performs ADL's independently . Performs IADL's independently . Calls provider office for new concerns or questions  Please see past updates related to this goal by clicking on the "Past Updates" button in the selected goal      .  "My BP often fluxerates to the high side" (pt-stated)   On track     [place] (see longitudinal plan of care for additional care plan information)  Current Barriers:  Whitney Henderson Knowledge Deficits related to disease process and Self Health management of HTN . Chronic Disease Management support and education needs related to HTN, Chronic Renal Disease stage III, Hypertensive Nephropathy, osteoporosis  Nurse Case Manager Clinical Goal(s):  Whitney Henderson Over the next 90 days, patient will work with [place] and PCP to address needs related to disease education and support to improve Self Health management of HTN  CCM RN CM Interventions:  08/24/19 call completed with patient  . Inter-disciplinary care team collaboration (see longitudinal plan of care) . Evaluation of current treatment plan related to HTN and patient's adherence to plan as established by provider . Determined patient's BP is within target range; discussed she is limiting Sodium and has increased her water intake  . Advised patient, providing education and rationale, to monitor blood pressure daily and record, calling CCM RN CM and or PCP for findings outside established parameters; Reviewed target BP is <130/80 . Discussed plans with  patient for ongoing care management follow up and provided patient with direct contact information for care management team  Patient Self Care Activities:  . Self administers medications as prescribed . Attends all scheduled provider appointments . Calls pharmacy for medication refills . Performs ADL's independently . Performs IADL's independently . Calls provider office for new concerns or questions  Please see past updates related to this goal by clicking on the "Past Updates" button in the selected goal        Other   .  Neurology Referral for tremor & memory difficulty   On track     [place] (see longitudinal plan of care for additional care plan information)  Current Barriers:  Whitney Henderson Knowledge Deficits related to evaluation and treatment for tremor and memory difficulties . Chronic Disease Management support and education needs related to HTN, Chronic Renal Disease stage III, Hypertensive Nephropathy, osteoporosis, tremor and memory difficulties  Nurse Case Manager Clinical Goal(s):  Whitney Henderson Over the next 90 days, patient will work with CCM RN CM and PCP  to address needs related to disease education and support to improve Self Health management of  tremors and memory difficulties   CCM RN CM Interventions:  08/24/19 call completed with patient  . Inter-disciplinary care team collaboration (see longitudinal plan of care) . Evaluation of current treatment plan related to tremors and memory difficulties and patient's adherence to plan as established by provider . Determined patient was experiencing GI upset when taking her Donepezil; Discussed patient was advised by Dr. Baird Cancer to take this medication with dinner; Determined Ms. Rosevear followed this advise and is no longer experiencing GI SE . Discussed her next Neurology f/u is scheduled with Butler Denmark NP on 12/07/19 @ 11:15 AM . Discussed plans with patient for ongoing care management follow up and provided patient with direct  contact information for care management team  Patient Self Care Activities:  . Self administers medications as prescribed . Attends all scheduled provider appointments . Calls pharmacy for medication refills . Performs ADL's independently . Performs IADL's independently . Calls provider office for new concerns or questions  Please see past updates related to this goal by clicking on the "Past Updates" button in the selected goal      .  Patient Stated   On track     12/01/2018, wants to keep up with health       Plan:   Telephone follow up appointment with care management team member scheduled for: 12/03/19  Barb Merino, RN, BSN, CCM Care Management Coordinator [place] [facility]  Direct Phone: [phone]

## 2019-08-25 NOTE — Patient Instructions (Addendum)
Visit Information  Goals Addressed      Patient Stated   .  "I would like to improve my kidney function" (pt-stated)   On track     [place] (see longitudinal plan of care for additional care plan information)  Current Barriers:  [name] Knowledge Deficits related to disease process and Self Health prevention and management of CKD . Chronic Disease Management support and education needs related to HTN, Chronic Renal Disease stage III, Hypertensive Nephropathy, osteoporosis  Nurse Case Manager Clinical Goal(s):  [name] Over the next 90 days, patient will work with CCM RN CM and PCP to address needs related to disease education and support to improve Self Health management of CKD   CCM RN CM Interventions:  08/24/19 call completed with patient  . Inter-disciplinary care team collaboration (see longitudinal plan of care) . Evaluation of current treatment plan related to CKD and patient's adherence to plan as established by provider . Reviewed and discussed patients recent GFR; Re-educated on importance of increasing water to 7-8 8 oz glasses or bottles per day unless otherwise directed by MD; limit Sodium in her diet by avoiding table salt, avoiding can foods and deli meats; educated on importance of keeping BP <130/80 consistently  . Discussed plans with patient for ongoing care management follow up and provided patient with direct contact information for care management team  Patient Self Care Activities:  . Self administers medications as prescribed . Attends all scheduled provider appointments . Calls pharmacy for medication refills . Performs ADL's independently . Performs IADL's independently . Calls provider office for new concerns or questions  Please see past updates related to this goal by clicking on the "Past Updates" button in the selected goal      .  "My BP often fluxerates to the high side" (pt-stated)   On track     [place] (see longitudinal plan of care for  additional care plan information)  Current Barriers:  [name] Knowledge Deficits related to disease process and Self Health management of HTN . Chronic Disease Management support and education needs related to HTN, Chronic Renal Disease stage III, Hypertensive Nephropathy, osteoporosis  Nurse Case Manager Clinical Goal(s):  [name] Over the next 90 days, patient will work with [place] and PCP to address needs related to disease education and support to improve Self Health management of HTN  CCM RN CM Interventions:  08/24/19 call completed with patient  . Inter-disciplinary care team collaboration (see longitudinal plan of care) . Evaluation of current treatment plan related to HTN and patient's adherence to plan as established by provider . Determined patient's BP is within target range; discussed she is limiting Sodium and has increased her water intake  . Advised patient, providing education and rationale, to monitor blood pressure daily and record, calling CCM RN CM and or PCP for findings outside established parameters; Reviewed target BP is <130/80 . Discussed plans with patient for ongoing care management follow up and provided patient with direct contact information for care management team  Patient Self Care Activities:  . Self administers medications as prescribed . Attends all scheduled provider appointments . Calls pharmacy for medication refills . Performs ADL's independently . Performs IADL's independently . Calls provider office for new concerns or questions  Please see past updates related to this goal by clicking on the "Past Updates" button in the selected goal        Other   .  Neurology Referral for tremor & memory  difficulty   On track     [place] (see longitudinal plan of care for additional care plan information)  Current Barriers:  [name] Knowledge Deficits related to evaluation and treatment for tremor and memory difficulties . Chronic Disease Management support  and education needs related to HTN, Chronic Renal Disease stage III, Hypertensive Nephropathy, osteoporosis, tremor and memory difficulties  Nurse Case Manager Clinical Goal(s):  [name] Over the next 90 days, patient will work with CCM RN CM and PCP  to address needs related to disease education and support to improve Self Health management of tremors and memory difficulties   CCM RN CM Interventions:  08/24/19 call completed with patient  . Inter-disciplinary care team collaboration (see longitudinal plan of care) . Evaluation of current treatment plan related to tremors and memory difficulties and patient's adherence to plan as established by provider . Determined patient was experiencing GI upset when taking her Donepezil; Discussed patient was advised by Dr. Baird Cancer to take this medication with dinner; Determined Ms. Boozer followed this advise and is no longer experiencing GI SE . Discussed her next Neurology f/u is scheduled with Butler Denmark NP on 12/07/19 @ 11:15 AM . Discussed plans with patient for ongoing care management follow up and provided patient with direct contact information for care management team  Patient Self Care Activities:  . Self administers medications as prescribed . Attends all scheduled provider appointments . Calls pharmacy for medication refills . Performs ADL's independently . Performs IADL's independently . Calls provider office for new concerns or questions  Please see past updates related to this goal by clicking on the "Past Updates" button in the selected goal      .  Patient Stated   On track     12/01/2018, wants to keep up with health       Patient verbalizes understanding of instructions provided today.   Telephone follow up appointment with care management team member scheduled for: 12/03/19  Barb Merino, RN, BSN, CCM Care Management Coordinator [place] [facility]  Direct Phone: [phone]

## 2019-09-03 ENCOUNTER — Other Ambulatory Visit: Payer: Self-pay

## 2019-09-03 ENCOUNTER — Ambulatory Visit: Payer: Medicare Other

## 2019-09-03 ENCOUNTER — Ambulatory Visit
Admission: RE | Admit: 2019-09-03 | Discharge: 2019-09-03 | Disposition: A | Discharge status: Home / Self Care | Payer: Medicare Other | Source: Ambulatory Visit | Attending: Internal Medicine | Admitting: Internal Medicine

## 2019-09-03 DIAGNOSIS — N644 Mastodynia: Secondary | ICD-10-CM | POA: Diagnosis not present

## 2019-10-12 ENCOUNTER — Telehealth: Payer: Self-pay

## 2019-10-12 ENCOUNTER — Ambulatory Visit: Payer: Medicare Other

## 2019-10-12 DIAGNOSIS — I10 Essential (primary) hypertension: Secondary | ICD-10-CM

## 2019-10-12 DIAGNOSIS — N183 Chronic kidney disease, stage 3 unspecified: Secondary | ICD-10-CM

## 2019-10-12 NOTE — Telephone Encounter (Cosign Needed Addendum)
  Chronic Care Management   Outreach Note  10/11/2019 Name: Whitney Henderson MRN: [id number] DOB: 04-07-1928  Referred by: [name], MD Reason for referral : Chronic Care Management (Inbound call from patient/daughter )   Inbound call received from patient's daughter Marcelia Petersen requesting f/u on patient's wait status for meals on wheels. The patient was referred to the case management team for assistance with care management and care coordination. Sent in basket message to embedded BSW Daneen Schick requesting her assistance concerning the status of meals on wheels for [name].   Follow Up Plan: Telephone follow up appointment with care management team member scheduled for: 10/12/19  Barb Merino, RN, BSN, CCM Care Management Coordinator [place] [facility]  Direct Phone: [phone]

## 2019-10-12 NOTE — Patient Instructions (Signed)
Social Worker Visit Information  Goals we discussed today:  Goals Addressed            This Visit's Progress   . COMPLETED: Collaborate with patients daughter as requested by [employer] to assist with nutritional resources       CARE PLAN ENTRY (see longitudinal plan of care for additional care plan information)  Current Barriers:  . ADL IADL limitations . Limited education about community [employer]"* . Ongoing barriers related to ability to prepare own meals in correspondence with HTN, stage 3 renal disease, and osteoporosis  Social Work Clinical Goal(s):  [name] Over the next 15 days, patient will work with SW to address concerns related to barriers surrounding meal preparation.  CCM SW Interventions: Completed 10/12/19 with daughter [name] . Successful outbound call placed to the patients daughter in response to a voice message received  . Determined the patient has yet to receive mobile meals . Chart review performed to note referral was accepted by [facility] in May of 2021 . Discussed the current wait list time is approximately 6 months or more . Provided [name] with the contact number to [facility] to follow up on patients place on the wait list . Encouraged [name] to contact SW as needed with future care coordination needs  Completed 05/31/19 . Inter-disciplinary care team collaboration (see longitudinal plan of care) . Reviewed [id number] platform to confirm patient referral accepted into mobile meals program . Goal Met  Patient Self Care Activities:  . Attends all scheduled provider appointments . Calls pharmacy for medication refills . Calls provider office for new concerns or questions . Supportive family to assist with patient care needs  Please see past updates related to this goal by clicking on the "Past Updates" button in the selected goal         Follow Up Plan: SW will follow up with patient by phone over  the next three months.   Daneen Schick, BSW, CDP Social Worker, Certified Dementia Practitioner [facility] [phone]

## 2019-10-12 NOTE — Chronic Care Management (AMB) (Signed)
Chronic Care Management    Social Work Follow Up Note  10/12/2019 Name: Whitney Henderson MRN: [id number] DOB: 06-Aug-1928  Whitney Henderson is a 86 y.o. year old female who is a primary care patient of [name], MD. The CCM team was consulted for assistance with care coordination.   Review of patient status, including review of consultants reports, other relevant assessments, and collaboration with appropriate care team members and the patient's provider was performed as part of comprehensive patient evaluation and provision of chronic care management services.    SDOH (Social Determinants of Health) assessments performed: No    Outpatient Encounter Medications as of 10/12/2019  Medication Sig  . Alpha-D-Galactosidase (BEANO MELTAWAYS) TABS Take 1 tablet by mouth daily as needed (gas).  (Patient not taking: Reported on 08/16/2019)  . amLODipine (NORVASC) 2.5 MG tablet TAKE 1 TABLET(2.5 MG) BY MOUTH TWICE DAILY  . Apoaequorin (PREVAGEN PO) Take by mouth daily at 6 (six) AM.  . ASPIRIN 81 PO Take 81 mg by mouth as needed (for pain or headache).   . betamethasone dipropionate (DIPROLENE) 0.05 % cream APPLY THIN LAYER EXTERNALLY TO THE AFFECTED AREA EVERY DAY AS NEEDED  . Calcium Carb-Cholecalciferol (CALCIUM 500 +D PO) Take by mouth. With 400 mg of vitd Doesn't take every day, only sometimes  . cholecalciferol (VITAMIN D) 1000 units tablet Take 1,000 Units by mouth daily.  (Patient not taking: Reported on 08/16/2019)  . donepezil (ARICEPT) 5 MG tablet Take 1 tablet (5 mg total) by mouth at bedtime.  . Ginkgo Biloba 120 MG CAPS Take 1 capsule by mouth daily at 12 noon.  (Patient not taking: Reported on 08/16/2019)  . Multiple Vitamins-Minerals (CENTRUM SILVER PO) Take by mouth.  . Multiple Vitamins-Minerals (PRESERVISION AREDS 2 PO) Take 1 tablet by mouth daily.   [name] Propylene Glycol (SYSTANE BALANCE OP) Place 1 drop into both eyes daily as needed (for itchy eyes). Uses when needed.   .  sertraline (ZOLOFT) 25 MG tablet Take 1 tablet (25 mg total) by mouth daily.  . Theanine 100 MG CAPS Take 1 capsule by mouth daily at 12 noon.  . vitamin C (ASCORBIC ACID) 500 MG tablet Take 500 mg by mouth daily.   No facility-administered encounter medications on file as of 10/12/2019.     Goals Addressed            This Visit's Progress   . COMPLETED: Collaborate with patients daughter as requested by [place] to assist with nutritional resources       CARE PLAN ENTRY (see longitudinal plan of care for additional care plan information)  Current Barriers:  . ADL IADL limitations . Limited education about community [employer]"* . Ongoing barriers related to ability to prepare own meals in correspondence with HTN, stage 3 renal disease, and osteoporosis  Social Work Clinical Goal(s):  [name] Over the next 15 days, patient will work with SW to address concerns related to barriers surrounding meal preparation.  CCM SW Interventions: Completed 10/12/19 with daughter [name] . Successful outbound call placed to the patients daughter in response to a voice message received  . Determined the patient has yet to receive mobile meals . Chart review performed to note referral was accepted by [facility] in May of 2021 . Discussed the current wait list time is approximately 6 months or more . Provided [name] with the contact number to [facility] to follow up on patients place on the wait  list . Encouraged [name] to contact SW as needed with future care coordination needs  Completed 05/31/19 . Inter-disciplinary care team collaboration (see longitudinal plan of care) . Reviewed [id number] platform to confirm patient referral accepted into mobile meals program . Goal Met  Patient Self Care Activities:  . Attends all scheduled provider appointments . Calls pharmacy for medication refills . Calls provider office for new concerns or  questions . Supportive family to assist with patient care needs  Please see past updates related to this goal by clicking on the "Past Updates" button in the selected goal         Follow Up Plan: SW will follow up with patient by phone over the next 3 months to assess for ongoing care coordination needs.   Daneen Schick, BSW, CDP Social Worker, Certified Dementia Practitioner [facility] [phone]  Total time spent performing care coordination and/or care management activities with the patient by phone or face to face = 8 minutes.

## 2019-10-31 ENCOUNTER — Other Ambulatory Visit: Payer: Self-pay | Admitting: Internal Medicine

## 2019-10-31 DIAGNOSIS — I10 Essential (primary) hypertension: Secondary | ICD-10-CM

## 2019-11-04 ENCOUNTER — Other Ambulatory Visit: Payer: Self-pay | Admitting: Neurology

## 2019-12-02 ENCOUNTER — Ambulatory Visit (INDEPENDENT_AMBULATORY_CARE_PROVIDER_SITE_OTHER): Payer: Medicare Other

## 2019-12-02 ENCOUNTER — Encounter: Payer: Self-pay | Admitting: Internal Medicine

## 2019-12-02 ENCOUNTER — Ambulatory Visit (INDEPENDENT_AMBULATORY_CARE_PROVIDER_SITE_OTHER): Payer: Medicare Other | Admitting: Internal Medicine

## 2019-12-02 ENCOUNTER — Other Ambulatory Visit: Payer: Self-pay

## 2019-12-02 VITALS — BP 126/74 | HR 66 | Temp 98.1°F | Ht 61.0 in | Wt 119.0 lb

## 2019-12-02 VITALS — BP 126/74 | HR 66 | Temp 98.1°F | Ht 61.0 in | Wt 119.6 lb

## 2019-12-02 DIAGNOSIS — R251 Tremor, unspecified: Secondary | ICD-10-CM | POA: Diagnosis not present

## 2019-12-02 DIAGNOSIS — N183 Chronic kidney disease, stage 3 unspecified: Secondary | ICD-10-CM

## 2019-12-02 DIAGNOSIS — I129 Hypertensive chronic kidney disease with stage 1 through stage 4 chronic kidney disease, or unspecified chronic kidney disease: Secondary | ICD-10-CM

## 2019-12-02 DIAGNOSIS — I10 Essential (primary) hypertension: Secondary | ICD-10-CM | POA: Diagnosis not present

## 2019-12-02 DIAGNOSIS — Z23 Encounter for immunization: Secondary | ICD-10-CM

## 2019-12-02 DIAGNOSIS — Z0000 Encounter for general adult medical examination without abnormal findings: Secondary | ICD-10-CM | POA: Diagnosis not present

## 2019-12-02 DIAGNOSIS — R5383 Other fatigue: Secondary | ICD-10-CM | POA: Diagnosis not present

## 2019-12-02 DIAGNOSIS — F419 Anxiety disorder, unspecified: Secondary | ICD-10-CM

## 2019-12-02 MED ORDER — BOOSTRIX 5-2.5-18.5 LF-MCG/0.5 IM SUSP: 0.5000 mL | Freq: Once | INTRAMUSCULAR | 0 refills | Status: AC

## 2019-12-02 NOTE — Patient Instructions (Addendum)
Take 1/2 amlodipine in AM and 1 tablet at dinner   Orthostatic Hypotension Blood pressure is a measurement of how strongly, or weakly, your blood is pressing against the walls of your arteries. Orthostatic hypotension is a sudden drop in blood pressure that happens when you quickly change positions, such as when you get up from sitting or lying down. Arteries are blood vessels that carry blood from your heart throughout your body. When blood pressure is too low, you may not get enough blood to your brain or to the rest of your organs. This can cause weakness, light-headedness, rapid heartbeat, and fainting. This can last for just a few seconds or for up to a few minutes. Orthostatic hypotension is usually not a serious problem. However, if it happens frequently or gets worse, it may be a sign of something more serious. What are the causes? This condition may be caused by:  Sudden changes in posture, such as standing up quickly after you have been sitting or lying down.  Blood loss.  Loss of body fluids (dehydration).  Heart problems.  Hormone (endocrine) problems.  Pregnancy.  Severe infection.  Lack of certain nutrients.  Severe allergic reactions (anaphylaxis).  Certain medicines, such as blood pressure medicine or medicines that make the body lose excess fluids (diuretics). Sometimes, this condition can be caused by not taking medicine as directed, such as taking too much of a certain medicine. What increases the risk? The following factors may make you more likely to develop this condition:  Age. Risk increases as you get older.  Conditions that affect the heart or the central nervous system.  Taking certain medicines, such as blood pressure medicine or diuretics.  Being pregnant. What are the signs or symptoms? Symptoms of this condition may include:  Weakness.  Light-headedness.  Dizziness.  Blurred vision.  Fatigue.  Rapid heartbeat.  Fainting, in severe  cases. How is this diagnosed? This condition is diagnosed based on:  Your medical history.  Your symptoms.  Your blood pressure measurement. Your health care provider will check your blood pressure when you are: ? Lying down. ? Sitting. ? Standing. A blood pressure reading is recorded as two numbers, such as "120 over 80" (or 120/80). The first ("top") number is called the systolic pressure. It is a measure of the pressure in your arteries as your heart beats. The second ("bottom") number is called the diastolic pressure. It is a measure of the pressure in your arteries when your heart relaxes between beats. Blood pressure is measured in a unit called mm Hg. Healthy blood pressure for most adults is 120/80. If your blood pressure is below 90/60, you may be diagnosed with hypotension. Other information or tests that may be used to diagnose orthostatic hypotension include:  Your other vital signs, such as your heart rate and temperature.  Blood tests.  Tilt table test. For this test, you will be safely secured to a table that moves you from a lying position to an upright position. Your heart rhythm and blood pressure will be monitored during the test. How is this treated? This condition may be treated by:  Changing your diet. This may involve eating more salt (sodium) or drinking more water.  Taking medicines to raise your blood pressure.  Changing the dosage of certain medicines you are taking that might be lowering your blood pressure.  Wearing compression stockings. These stockings help to prevent blood clots and reduce swelling in your legs. In some cases, you may need to  go to the hospital for:  Fluid replacement. This means you will receive fluids through an IV.  Blood replacement. This means you will receive donated blood through an IV (transfusion).  Treating an infection or heart problems, if this applies.  Monitoring. You may need to be monitored while medicines that you  are taking wear off. Follow these instructions at home: Eating and drinking   Drink enough fluid to keep your urine pale yellow.  Eat a healthy diet, and follow instructions from your health care provider about eating or drinking restrictions. A healthy diet includes: ? Fresh fruits and vegetables. ? Whole grains. ? Lean meats. ? Low-fat dairy products.  Eat extra salt only as directed. Do not add extra salt to your diet unless your health care provider told you to do that.  Eat frequent, small meals.  Avoid standing up suddenly after eating. Medicines  Take over-the-counter and prescription medicines only as told by your health care provider. ? Follow instructions from your health care provider about changing the dosage of your current medicines, if this applies. ? Do not stop or adjust any of your medicines on your own. General instructions   Wear compression stockings as told by your health care provider.  Get up slowly from lying down or sitting positions. This gives your blood pressure a chance to adjust.  Avoid hot showers and excessive heat as directed by your health care provider.  Return to your normal activities as told by your health care provider. Ask your health care provider what activities are safe for you.  Do not use any products that contain nicotine or tobacco, such as cigarettes, e-cigarettes, and chewing tobacco. If you need help quitting, ask your health care provider.  Keep all follow-up visits as told by your health care provider. This is important. Contact a health care provider if you:  Vomit.  Have diarrhea.  Have a fever for more than 2-3 days.  Feel more thirsty than usual.  Feel weak and tired. Get help right away if you:  Have chest pain.  Have a fast or irregular heartbeat.  Develop numbness in any part of your body.  Cannot move your arms or your legs.  Have trouble speaking.  Become sweaty or feel  light-headed.  Faint.  Feel short of breath.  Have trouble staying awake.  Feel confused. Summary  Orthostatic hypotension is a sudden drop in blood pressure that happens when you quickly change positions.  Orthostatic hypotension is usually not a serious problem.  It is diagnosed by having your blood pressure taken lying down, sitting, and then standing.  It may be treated by changing your diet or adjusting your medicines. This information is not intended to replace advice given to you by your health care provider. Make sure you discuss any questions you have with your health care provider. Document Revised: 07/03/2017 Document Reviewed: 07/03/2017 Elsevier Patient Education  [place].

## 2019-12-02 NOTE — Patient Instructions (Signed)
Ms. Hoobler , Thank you for taking time to come for your Medicare Wellness Visit. I appreciate your ongoing commitment to your health goals. Please review the following plan we discussed and let me know if I can assist you in the future.   Screening recommendations/referrals: Colonoscopy: not required Mammogram: completed 09/03/2019, due 09/02/2020 Bone Density: completed 08/12/2018, due 08/11/2020 Recommended yearly ophthalmology/optometry visit for glaucoma screening and checkup Recommended yearly dental visit for hygiene and checkup  Vaccinations: Influenza vaccine: completed 10/14/2019, due 08/21/2020 Pneumococcal vaccine: completed 12/04/2016 Tdap vaccine: sent to pharmacy Shingles vaccine: completed    Covid-19: 03/01/2019, 02/09/2019  Advanced directives: Please bring a copy of your POA (Power of Attorney) and/or Living Will to your next appointment.   Conditions/risks identified: none  Next appointment: 02/28/2020 at 10:30 Follow up in one year for your annual wellness visit    Preventive Care 65 Years and Older, Female Preventive care refers to lifestyle choices and visits with your health care provider that can promote health and wellness. What does preventive care include?  A yearly physical exam. This is also called an annual well check.  Dental exams once or twice a year.  Routine eye exams. Ask your health care provider how often you should have your eyes checked.  Personal lifestyle choices, including:  Daily care of your teeth and gums.  Regular physical activity.  Eating a healthy diet.  Avoiding tobacco and drug use.  Limiting alcohol use.  Practicing safe sex.  Taking low-dose aspirin every day.  Taking vitamin and mineral supplements as recommended by your health care provider. What happens during an annual well check? The services and screenings done by your health care provider during your annual well check will depend on your age, overall health,  lifestyle risk factors, and family history of disease. Counseling  Your health care provider may ask you questions about your:  Alcohol use.  Tobacco use.  Drug use.  Emotional well-being.  Home and relationship well-being.  Sexual activity.  Eating habits.  History of falls.  Memory and ability to understand (cognition).  Work and work Statistician.  Reproductive health. Screening  You may have the following tests or measurements:  Height, weight, and BMI.  Blood pressure.  Lipid and cholesterol levels. These may be checked every 5 years, or more frequently if you are over 47 years old.  Skin check.  Lung cancer screening. You may have this screening every year starting at age 76 if you have a 30-pack-year history of smoking and currently smoke or have quit within the past 15 years.  Fecal occult blood test (FOBT) of the stool. You may have this test every year starting at age 24.  Flexible sigmoidoscopy or colonoscopy. You may have a sigmoidoscopy every 5 years or a colonoscopy every 10 years starting at age 77.  Hepatitis C blood test.  Hepatitis B blood test.  Sexually transmitted disease (STD) testing.  Diabetes screening. This is done by checking your blood sugar (glucose) after you have not eaten for a while (fasting). You may have this done every 1-3 years.  Bone density scan. This is done to screen for osteoporosis. You may have this done starting at age 89.  Mammogram. This may be done every 1-2 years. Talk to your health care provider about how often you should have regular mammograms. Talk with your health care provider about your test results, treatment options, and if necessary, the need for more tests. Vaccines  Your health care provider may recommend  certain vaccines, such as:  Influenza vaccine. This is recommended every year.  Tetanus, diphtheria, and acellular pertussis (Tdap, Td) vaccine. You may need a Td booster every 10 years.  Zoster  vaccine. You may need this after age 55.  Pneumococcal 13-valent conjugate (PCV13) vaccine. One dose is recommended after age 45.  Pneumococcal polysaccharide (PPSV23) vaccine. One dose is recommended after age 29. Talk to your health care provider about which screenings and vaccines you need and how often you need them. This information is not intended to replace advice given to you by your health care provider. Make sure you discuss any questions you have with your health care provider. Document Released: 02/03/2015 Document Revised: 09/27/2015 Document Reviewed: 11/08/2014 Elsevier Interactive Patient Education  2017 [place] Prevention in the Home Falls can cause injuries. They can happen to people of all ages. There are many things you can do to make your home safe and to help prevent falls. What can I do on the outside of my home?  Regularly fix the edges of walkways and driveways and fix any cracks.  Remove anything that might make you trip as you walk through a door, such as a raised step or threshold.  Trim any bushes or trees on the path to your home.  Use bright outdoor lighting.  Clear any walking paths of anything that might make someone trip, such as rocks or tools.  Regularly check to see if handrails are loose or broken. Make sure that both sides of any steps have handrails.  Any raised decks and porches should have guardrails on the edges.  Have any leaves, snow, or ice cleared regularly.  Use sand or salt on walking paths during winter.  Clean up any spills in your garage right away. This includes oil or grease spills. What can I do in the bathroom?  Use night lights.  Install grab bars by the toilet and in the tub and shower. Do not use towel bars as grab bars.  Use non-skid mats or decals in the tub or shower.  If you need to sit down in the shower, use a plastic, non-slip stool.  Keep the floor dry. Clean up any water that spills on the  floor as soon as it happens.  Remove soap buildup in the tub or shower regularly.  Attach bath mats securely with double-sided non-slip rug tape.  Do not have throw rugs and other things on the floor that can make you trip. What can I do in the bedroom?  Use night lights.  Make sure that you have a light by your bed that is easy to reach.  Do not use any sheets or blankets that are too big for your bed. They should not hang down onto the floor.  Have a firm chair that has side arms. You can use this for support while you get dressed.  Do not have throw rugs and other things on the floor that can make you trip. What can I do in the kitchen?  Clean up any spills right away.  Avoid walking on wet floors.  Keep items that you use a lot in easy-to-reach places.  If you need to reach something above you, use a strong step stool that has a grab bar.  Keep electrical cords out of the way.  Do not use floor polish or wax that makes floors slippery. If you must use wax, use non-skid floor wax.  Do not have throw rugs and other  things on the floor that can make you trip. What can I do with my stairs?  Do not leave any items on the stairs.  Make sure that there are handrails on both sides of the stairs and use them. Fix handrails that are broken or loose. Make sure that handrails are as long as the stairways.  Check any carpeting to make sure that it is firmly attached to the stairs. Fix any carpet that is loose or worn.  Avoid having throw rugs at the top or bottom of the stairs. If you do have throw rugs, attach them to the floor with carpet tape.  Make sure that you have a light switch at the top of the stairs and the bottom of the stairs. If you do not have them, ask someone to add them for you. What else can I do to help prevent falls?  Wear shoes that:  Do not have high heels.  Have rubber bottoms.  Are comfortable and fit you well.  Are closed at the toe. Do not wear  sandals.  If you use a stepladder:  Make sure that it is fully opened. Do not climb a closed stepladder.  Make sure that both sides of the stepladder are locked into place.  Ask someone to hold it for you, if possible.  Clearly mark and make sure that you can see:  Any grab bars or handrails.  First and last steps.  Where the edge of each step is.  Use tools that help you move around (mobility aids) if they are needed. These include:  Canes.  Walkers.  Scooters.  Crutches.  Turn on the lights when you go into a dark area. Replace any light bulbs as soon as they burn out.  Set up your furniture so you have a clear path. Avoid moving your furniture around.  If any of your floors are uneven, fix them.  If there are any pets around you, be aware of where they are.  Review your medicines with your doctor. Some medicines can make you feel dizzy. This can increase your chance of falling. Ask your doctor what other things that you can do to help prevent falls. This information is not intended to replace advice given to you by your health care provider. Make sure you discuss any questions you have with your health care provider. Document Released: 11/03/2008 Document Revised: 06/15/2015 Document Reviewed: 02/11/2014 Elsevier Interactive Patient Education  2017 [employer].

## 2019-12-02 NOTE — Progress Notes (Signed)
I,Katawbba Wiggins,acting as a Education administrator for Maximino Greenland, MD.,have documented all relevant documentation on the behalf of Maximino Greenland, MD,as directed by  Maximino Greenland, MD while in the presence of Maximino Greenland, MD.  This visit occurred during the SARS-CoV-2 public health emergency.  Safety protocols were in place, including screening questions prior to the visit, additional usage of staff PPE, and extensive cleaning of exam room while observing appropriate contact time as indicated for disinfecting solutions.  Subjective:     Patient ID: Whitney Henderson , female    DOB: January 25, 1928 , 86 y.o.   MRN: [id number]   Chief Complaint  Patient presents with  . Hypertension    HPI  She presents for BP check. She reports compliance with meds.  She reports she doesn't feel too good in the mornings. She thinks it may be due to bp medication. Reports feeling a little dizzy. She is accompanied by her daughter today.   Hypertension This is a chronic problem. The current episode started more than 1 year ago. The problem has been gradually improving since onset. The problem is uncontrolled. Pertinent negatives include no blurred vision, chest pain, headaches, neck pain, orthopnea, palpitations or shortness of breath. The current treatment provides moderate improvement. Compliance problems include exercise.      Past Medical History:  Diagnosis Date  . Anxiety   . Cataracts, bilateral   . Discoid lupus   . Headache 04/27/2015   Left>right periorbital  . Hypertension   . Memory difficulties 09/04/2016     Family History  Problem Relation Age of Onset  . Early death Mother   . Cancer Father   . Breast cancer Daughter        41s     Current Outpatient Medications:  .  amLODipine (NORVASC) 2.5 MG tablet, TAKE 1 TABLET(2.5 MG) BY MOUTH TWICE DAILY, Disp: 180 tablet, Rfl: 1 .  Apoaequorin (PREVAGEN PO), Take by mouth daily at 6 (six) AM., Disp: , Rfl:  .  B Complex Vitamins (B COMPLEX PO),  Take by mouth., Disp: , Rfl:  .  Calcium Carb-Cholecalciferol (CALCIUM 500 +D PO), Take by mouth. With 400 mg of vitd Doesn't take every day, only sometimes, Disp: , Rfl:  .  cholecalciferol (VITAMIN D) 1000 units tablet, Take 1,000 Units by mouth daily. , Disp: , Rfl:  .  donepezil (ARICEPT) 5 MG tablet, TAKE 1 TABLET(5 MG) BY MOUTH AT BEDTIME, Disp: 30 tablet, Rfl: 3 .  Ginkgo Biloba 120 MG CAPS, Take 1 capsule by mouth daily at 12 noon. , Disp: , Rfl:  .  Multiple Vitamins-Minerals (CENTRUM SILVER PO), Take by mouth., Disp: , Rfl:  .  Multiple Vitamins-Minerals (PRESERVISION AREDS 2 PO), Take 1 tablet by mouth daily. , Disp: , Rfl:  .  Propylene Glycol (SYSTANE BALANCE OP), Place 1 drop into both eyes daily as needed (for itchy eyes). Uses when needed. , Disp: , Rfl:  .  sertraline (ZOLOFT) 25 MG tablet, TAKE 1 TABLET(25 MG) BY MOUTH DAILY, Disp: 90 tablet, Rfl: 1 .  Theanine 100 MG CAPS, Take 1 capsule by mouth daily at 12 noon., Disp: , Rfl:  .  vitamin C (ASCORBIC ACID) 500 MG tablet, Take 500 mg by mouth daily., Disp: , Rfl:  .  Alpha-D-Galactosidase (BEANO MELTAWAYS) TABS, Take 1 tablet by mouth daily as needed (gas).  (Patient not taking: Reported on 08/16/2019), Disp: , Rfl:  .  ASPIRIN 81 PO, Take 81 mg by mouth as  needed (for pain or headache). , Disp: , Rfl:  .  betamethasone dipropionate (DIPROLENE) 0.05 % cream, APPLY THIN LAYER EXTERNALLY TO THE AFFECTED AREA EVERY DAY AS NEEDED, Disp: 60 g, Rfl: 2   Allergies  Allergen Reactions  . Codeine Rash     Review of Systems  Constitutional: Negative.   Eyes: Negative for blurred vision.  Respiratory: Negative.  Negative for shortness of breath.   Cardiovascular: Negative.  Negative for chest pain, palpitations and orthopnea.  Gastrointestinal: Negative.   Musculoskeletal: Negative for neck pain.  Neurological: Negative.  Negative for headaches.  Psychiatric/Behavioral: The patient is nervous/anxious.      Today's Vitals    12/02/19 0850  BP: 126/74  Pulse: 66  Temp: 98.1 F (36.7 C)  TempSrc: Oral  Weight: 119 lb 9.6 oz (54.3 kg)  Height: 5' 1" (1.549 m)  PainSc: 0-No pain   Body mass index is 22.6 kg/m.  Wt Readings from Last 3 Encounters:  12/02/19 119 lb (54 kg)  12/02/19 119 lb 9.6 oz (54.3 kg)  08/16/19 125 lb 9.6 oz (57 kg)   Objective:  Physical Exam Vitals and nursing note reviewed.  Constitutional:      Appearance: Normal appearance.  HENT:     Head: Normocephalic and atraumatic.  Cardiovascular:     Rate and Rhythm: Normal rate and regular rhythm.     Heart sounds: Normal heart sounds.  Pulmonary:     Effort: Pulmonary effort is normal.     Breath sounds: Normal breath sounds.  Musculoskeletal:     Cervical back: Normal range of motion.  Skin:    General: Skin is warm.  Neurological:     General: No focal deficit present.     Mental Status: She is alert.     Comments: RUE resting tremor  Psychiatric:        Mood and Affect: Mood normal.        Behavior: Behavior normal.         Assessment And Plan:     1. Hypertensive nephropathy Comments: Chronic, well controlled. Orthostatics performed, her BP did drop when standing. Pt advised to take 1/2 tablet in am and one tablet amlodipine with evening meal. I also stressed importance of adequate hydration.  She will let me know in a week or two how she is tolerating this. I will check her renal function today.  - BMP8+EGFR  2. Stage 3 chronic kidney disease, unspecified whether stage 3a or 3b CKD (HCC) Comments: Chornic, this has been stable.   3. Tremor Comments: Chronic, also followed by Neurology.   4. Fatigue, unspecified type Comments: Chronic, encouraged to stay well hydrated. Daughter confirms pt does not drink many liquids.  - CBC no Diff - TSH  5. Anxiety Comments: Advised to consider magnesium supplementation. Reminded this is good for cardiovascular health, and can help to reduce anxiety.      Patient was  given opportunity to ask questions. Patient verbalized understanding of the plan and was able to repeat key elements of the plan. All questions were answered to their satisfaction.  Maximino Greenland, MD   I, Maximino Greenland, MD, have reviewed all documentation for this visit. The documentation on 01/01/20 for the exam, diagnosis, procedures, and orders are all accurate and complete.  THE PATIENT IS ENCOURAGED TO PRACTICE SOCIAL DISTANCING DUE TO THE COVID-19 PANDEMIC.

## 2019-12-02 NOTE — Progress Notes (Signed)
This visit occurred during the SARS-CoV-2 public health emergency.  Safety protocols were in place, including screening questions prior to the visit, additional usage of staff PPE, and extensive cleaning of exam room while observing appropriate contact time as indicated for disinfecting solutions.  Subjective:   Whitney Henderson is a 86 y.o. female who presents for Medicare Annual (Subsequent) preventive examination.  Review of Systems     Cardiac Risk Factors include: advanced age (>76men, >59 women);hypertension     Objective:    Today's Vitals   12/02/19 0917  BP: 126/74  Pulse: 66  Temp: 98.1 F (36.7 C)  TempSrc: Oral  Weight: 119 lb (54 kg)  Height: 5\' 1"  (1.549 m)   Body mass index is 22.48 kg/m.  Advanced Directives 12/02/2019 12/01/2018 10/13/2018 02/22/2018 10/30/2017 11/14/2016 04/27/2015  Does Patient Have a Medical Advance Directive? Yes Yes Yes No Yes No No  Type of Paramedic of [place];Living will [place];Living will Healthcare Power of [place];Living will - -  Does patient want to make changes to medical advance directive? - - No - Patient declined - No - Patient declined - -  Copy of [place] in Chart? No - copy requested No - copy requested No - copy requested - No - copy requested - -  Would patient like information on creating a medical advance directive? - - - No - Patient declined - No - Patient declined Yes - Educational materials given    Current Medications (verified) Outpatient Encounter Medications as of 12/02/2019  Medication Sig  . Alpha-D-Galactosidase (BEANO MELTAWAYS) TABS Take 1 tablet by mouth daily as needed (gas).  (Patient not taking: Reported on 08/16/2019)  . amLODipine (NORVASC) 2.5 MG tablet TAKE 1 TABLET(2.5 MG) BY MOUTH TWICE DAILY  . Apoaequorin (PREVAGEN PO) Take by mouth daily at 6 (six) AM.  . ASPIRIN 81 PO Take 81 mg by mouth as needed  (for pain or headache).   . betamethasone dipropionate (DIPROLENE) 0.05 % cream APPLY THIN LAYER EXTERNALLY TO THE AFFECTED AREA EVERY DAY AS NEEDED  . Calcium Carb-Cholecalciferol (CALCIUM 500 +D PO) Take by mouth. With 400 mg of vitd Doesn't take every day, only sometimes  . cholecalciferol (VITAMIN D) 1000 units tablet Take 1,000 Units by mouth daily.   [name] donepezil (ARICEPT) 5 MG tablet TAKE 1 TABLET(5 MG) BY MOUTH AT BEDTIME  . Ginkgo Biloba 120 MG CAPS Take 1 capsule by mouth daily at 12 noon.   . Multiple Vitamins-Minerals (CENTRUM SILVER PO) Take by mouth.  . Multiple Vitamins-Minerals (PRESERVISION AREDS 2 PO) Take 1 tablet by mouth daily.   [name] Propylene Glycol (SYSTANE BALANCE OP) Place 1 drop into both eyes daily as needed (for itchy eyes). Uses when needed.   . sertraline (ZOLOFT) 25 MG tablet TAKE 1 TABLET(25 MG) BY MOUTH DAILY  . Tdap (BOOSTRIX) 5-2.5-18.5 LF-MCG/0.5 injection Inject 0.5 mLs into the muscle once for 1 dose.  . Theanine 100 MG CAPS Take 1 capsule by mouth daily at 12 noon.  . vitamin C (ASCORBIC ACID) 500 MG tablet Take 500 mg by mouth daily.   No facility-administered encounter medications on file as of 12/02/2019.    Allergies (verified) Codeine   History: Past Medical History:  Diagnosis Date  . Anxiety   . Cataracts, bilateral   . Discoid lupus   . Headache 04/27/2015   Left>right periorbital  . Hypertension   . Memory difficulties 09/04/2016  Past Surgical History:  Procedure Laterality Date  . ABDOMINAL HYSTERECTOMY    . BREAST EXCISIONAL BIOPSY Left over 73 ys ago   benign  . CHOLECYSTECTOMY    . EXCISIONAL HEMORRHOIDECTOMY     Family History  Problem Relation Age of Onset  . Early death Mother   . Cancer Father   . Breast cancer Daughter        77s   Social History   Socioeconomic History  . Marital status: Widowed    Spouse name: Izell [place]   . Number of children: 4  . Years of education: Education officer, environmental  . Highest education level:  Not on file  Occupational History  . Not on file  Tobacco Use  . Smoking status: Former Smoker    Years: 2.00    Types: Cigarettes  . Smokeless tobacco: Never Used  . Tobacco comment: smoked sometimes not daily  Vaping Use  . Vaping Use: Never used  Substance and Sexual Activity  . Alcohol use: No  . Drug use: No  . Sexual activity: Not Currently  Other Topics Concern  . Not on file  Social History Narrative   Live with husband,  4 children.    Caffeine: 1 cup daily    Social Determinants of Health   Financial Resource Strain: Low Risk   . Difficulty of Paying Living Expenses: Not hard at all  Food Insecurity: No Food Insecurity  . Worried About Charity fundraiser in the Last Year: Never true  . Ran Out of Food in the Last Year: Never true  Transportation Needs: No Transportation Needs  . Lack of Transportation (Medical): No  . Lack of Transportation (Non-Medical): No  Physical Activity: Sufficiently Active  . Days of Exercise per Week: 3 days  . Minutes of Exercise per Session: 60 min  Stress: No Stress Concern Present  . Feeling of Stress : Only a little  Social Connections:   . Frequency of Communication with Friends and Family: Not on file  . Frequency of Social Gatherings with Friends and Family: Not on file  . Attends Religious Services: Not on file  . Active Member of Clubs or Organizations: Not on file  . Attends Archivist Meetings: Not on file  . Marital Status: Not on file    Tobacco Counseling Counseling given: Not Answered Comment: smoked sometimes not daily   Clinical Intake:  Pre-visit preparation completed: Yes  Pain : No/denies pain     Nutritional Status: BMI of 19-24  Normal Nutritional Risks: Nausea/ vomitting/ diarrhea (diarrhea this morning) Diabetes: No  How often do you need to have someone help you when you read instructions, pamphlets, or other written materials from your doctor or pharmacy?: 1 - Never What is the  last grade level you completed in school?: college  Diabetic? no  Interpreter Needed?: No  Information entered by :: NAllen LPN   Activities of Daily Living In your present state of health, do you have any difficulty performing the following activities: 12/02/2019 12/02/2019  Hearing? Y Y  Comment sometimes the pt states some times  Vision? Y Y  Comment sometimes -  Difficulty concentrating or making decisions? Y Y  Comment sometimes -  Walking or climbing stairs? N N  Dressing or bathing? N N  Doing errands, shopping? N Y  Comment - yes because she is still mourning her husband  Conservation officer, nature and eating ? N -  Using the Toilet? N -  In the past six  months, have you accidently leaked urine? Y -  Do you have problems with loss of bowel control? N -  Managing your Medications? N -  Managing your Finances? N -  Housekeeping or managing your Housekeeping? N -  Some recent data might be hidden    Patient Care Team: [name], MD as PCP - General (Internal Medicine) Monna Fam, MD as Consulting Physician (Ophthalmology) Rex Kras, Claudette Stapler, RN as Case Manager Daneen Schick as Social Worker  Indicate any recent [place] you may have received from other than [facility] providers in the past year (date may be approximate).     Assessment:   This is a routine wellness examination for [place].  Hearing/Vision screen  Hearing Screening   125Hz  250Hz  500Hz  1000Hz  2000Hz  3000Hz  4000Hz  6000Hz  8000Hz   Right ear:           Left ear:           Vision Screening Comments: Regular eye exams, Dr. Herbert Deaner  Dietary issues and exercise activities discussed: Current Exercise Habits: Home exercise routine, Type of exercise: walking, Time (Minutes): 60  Goals    .  "I would like to improve my kidney function" (pt-stated)      CARE PLAN ENTRY (see longitudinal plan of care for additional care plan information)  Current Barriers:  [name] Knowledge Deficits related to disease process  and Self Health prevention and management of CKD . Chronic Disease Management support and education needs related to HTN, Chronic Renal Disease stage III, Hypertensive Nephropathy, osteoporosis  Nurse Case Manager Clinical Goal(s):  [name] Over the next 90 days, patient will work with CCM RN CM and PCP to address needs related to disease education and support to improve Self Health management of CKD   CCM RN CM Interventions:  08/24/19 call completed with patient  . Inter-disciplinary care team collaboration (see longitudinal plan of care) . Evaluation of current treatment plan related to CKD and patient's adherence to plan as established by provider . Reviewed and discussed patients recent GFR; Re-educated on importance of increasing water to 7-8 8 oz glasses or bottles per day unless otherwise directed by MD; limit Sodium in her diet by avoiding table salt, avoiding can foods and deli meats; educated on importance of keeping BP <130/80 consistently  . Discussed plans with patient for ongoing care management follow up and provided patient with direct contact information for care management team  Patient Self Care Activities:  . Self administers medications as prescribed . Attends all scheduled provider appointments . Calls pharmacy for medication refills . Performs ADL's independently . Performs IADL's independently . Calls provider office for new concerns or questions  Please see past updates related to this goal by clicking on the "Past Updates" button in the selected goal      .  "My BP often fluxerates to the high side" (pt-stated)      CARE PLAN ENTRY (see longitudinal plan of care for additional care plan information)  Current Barriers:  [name] Knowledge Deficits related to disease process and Self Health management of HTN . Chronic Disease Management support and education needs related to HTN, Chronic Renal Disease stage III, Hypertensive Nephropathy, osteoporosis  Nurse Case Manager  Clinical Goal(s):  [name] Over the next 90 days, patient will work with [place] and PCP to address needs related to disease education and support to improve Self Health management of HTN  CCM RN CM Interventions:  08/24/19 call completed with patient  . Inter-disciplinary care team collaboration (  see longitudinal plan of care) . Evaluation of current treatment plan related to HTN and patient's adherence to plan as established by provider . Determined patient's BP is within target range; discussed she is limiting Sodium and has increased her water intake  . Advised patient, providing education and rationale, to monitor blood pressure daily and record, calling CCM RN CM and or PCP for findings outside established parameters; Reviewed target BP is <130/80 . Discussed plans with patient for ongoing care management follow up and provided patient with direct contact information for care management team  Patient Self Care Activities:  . Self administers medications as prescribed . Attends all scheduled provider appointments . Calls pharmacy for medication refills . Performs ADL's independently . Performs IADL's independently . Calls provider office for new concerns or questions  Please see past updates related to this goal by clicking on the "Past Updates" button in the selected goal      .  Neurology Referral for tremor & memory difficulty      CARE PLAN ENTRY (see longitudinal plan of care for additional care plan information)  Current Barriers:  [name] Knowledge Deficits related to evaluation and treatment for tremor and memory difficulties . Chronic Disease Management support and education needs related to HTN, Chronic Renal Disease stage III, Hypertensive Nephropathy, osteoporosis, tremor and memory difficulties  Nurse Case Manager Clinical Goal(s):  [name] Over the next 90 days, patient will work with CCM RN CM and PCP  to address needs related to disease education and support to improve Self Health  management of tremors and memory difficulties   CCM RN CM Interventions:  08/24/19 call completed with patient  . Inter-disciplinary care team collaboration (see longitudinal plan of care) . Evaluation of current treatment plan related to tremors and memory difficulties and patient's adherence to plan as established by provider . Determined patient was experiencing GI upset when taking her Donepezil; Discussed patient was advised by Dr. Baird Cancer to take this medication with dinner; Determined Ms. Mercado followed this advise and is no longer experiencing GI SE . Discussed her next Neurology f/u is scheduled with Butler Denmark NP on 12/07/19 @ 11:15 AM . Discussed plans with patient for ongoing care management follow up and provided patient with direct contact information for care management team  Patient Self Care Activities:  . Self administers medications as prescribed . Attends all scheduled provider appointments . Calls pharmacy for medication refills . Performs ADL's independently . Performs IADL's independently . Calls provider office for new concerns or questions  Please see past updates related to this goal by clicking on the "Past Updates" button in the selected goal      .  Patient Stated      12/01/2018, wants to keep up with health    .  Patient Stated      12/02/2019, wants to feel good      Depression Screen PHQ 2/9 Scores 12/02/2019 05/19/2019 12/01/2018 10/13/2018 05/26/2018 02/09/2018 10/30/2017  PHQ - 2 Score 3 1 1 1  0 6 2  PHQ- 9 Score 6 2 2 2  - 8 4  Exception Documentation Other- indicate reason in comment box - - - - - -  Not completed lready completed by CMA - - - - - -    Fall Risk Fall Risk  12/02/2019 12/02/2019 12/01/2018 11/05/2018 10/13/2018  Falls in the past year? 0 0 0 0 0  Number falls in past yr: - - 0 - -  Risk for fall due to :  Medication side effect - Medication side effect - -  Follow up Falls evaluation completed;Education provided;Falls prevention  discussed - Falls evaluation completed;Education provided;Falls prevention discussed - -    Any stairs in or around the home? Yes  If so, are there any without handrails? No  Home free of loose throw rugs in walkways, pet beds, electrical cords, etc? Yes  Adequate lighting in your home to reduce risk of falls? Yes   ASSISTIVE DEVICES UTILIZED TO PREVENT FALLS:  Life alert? No  Use of a cane, walker or w/c? No  Grab bars in the bathroom? Yes  Shower chair or bench in shower? Yes  Elevated toilet seat or a handicapped toilet? No   TIMED UP AND GO:  Was the test performed? No .    Gait slow and steady without use of assistive device  Cognitive Function: MMSE - Mini Mental State Exam 07/14/2019 09/04/2016  Orientation to time 4 4  Orientation to Place 5 5  Registration 3 3  Attention/ Calculation 3 5  Recall 1 0  Language- name 2 objects 2 2  Language- repeat 1 1  Language- follow 3 step command 3 3  Language- read & follow direction 1 1  Write a sentence 1 1  Copy design 0 1  Total score 24 26     6CIT Screen 12/02/2019 12/01/2018 10/30/2017  What Year? 0 points 0 points 0 points  What month? 0 points 0 points 0 points  What time? 0 points 0 points 0 points  Count back from 20 0 points 0 points 0 points  Months in reverse 4 points 4 points 2 points  Repeat phrase 8 points 10 points 2 points  Total Score 12 14 4     Immunizations Immunization History  Administered Date(s) Administered  . Fluad Quad(high Dose 65+) 09/25/2018  . Influenza, High Dose Seasonal PF 10/02/2016, 10/14/2019  . Influenza-Unspecified 10/22/2017  . PFIZER SARS-COV-2 Vaccination 02/09/2019, 03/01/2019  . Pneumococcal Conjugate-13 11/28/2015  . Pneumococcal Polysaccharide-23 12/04/2016  . Zoster Recombinat (Shingrix) 09/25/2018, 12/04/2018    TDAP status: Due, Education has been provided regarding the importance of this vaccine. Advised may receive this vaccine at local pharmacy or Health  Dept. Aware to provide a copy of the vaccination record if obtained from local pharmacy or Health Dept. Verbalized acceptance and understanding. Flu Vaccine status: Up to date Pneumococcal vaccine status: Up to date Covid-19 vaccine status: Completed vaccines  Qualifies for Shingles Vaccine? Yes   Zostavax completed No   Shingrix Completed?: Yes  Screening Tests Health Maintenance  Topic Date Due  . TETANUS/TDAP  Never done  . INFLUENZA VACCINE  Completed  . DEXA SCAN  Completed  . COVID-19 Vaccine  Completed  . PNA vac Low Risk Adult  Completed    Health Maintenance  Health Maintenance Due  Topic Date Due  . TETANUS/TDAP  Never done    Colorectal cancer screening: No longer required.  Mammogram status: Completed 09/03/2019. Repeat every year Bone Density status: Completed 7/22/20202. Results reflect: Bone density results: OSTEOPOROSIS. Repeat every 2 years.  Lung Cancer Screening: (Low Dose CT Chest recommended if Age 3-80 years, 30 pack-year currently smoking OR have quit w/in 15years.) does not qualify.   Lung Cancer Screening Referral: no  Additional Screening:  Hepatitis C Screening: does not qualify;   Vision Screening: Recommended annual ophthalmology exams for early detection of glaucoma and other disorders of the eye. Is the patient up to date with their annual eye exam?  Yes  Who is the provider or what is the name of the office in which the patient attends annual eye exams? Dr. Herbert Deaner If pt is not established with a provider, would they like to be referred to a provider to establish care? No .   Dental Screening: Recommended annual dental exams for proper oral hygiene  Community Resource Referral / Chronic Care Management: CRR required this visit?  No   CCM required this visit?  No      Plan:     I have personally reviewed and noted the following in the patient's chart:   . Medical and social history . Use of alcohol, tobacco or illicit drugs   . Current medications and supplements . Functional ability and status . Nutritional status . Physical activity . Advanced directives . List of other physicians . Hospitalizations, surgeries, and ER visits in previous 12 months . Vitals . Screenings to include cognitive, depression, and falls . Referrals and appointments  In addition, I have reviewed and discussed with patient certain preventive protocols, quality metrics, and best practice recommendations. A written personalized care plan for preventive services as well as general preventive health recommendations were provided to patient.     Kellie Simmering, LPN   [id number]   Nurse Notes:

## 2019-12-03 ENCOUNTER — Telehealth: Payer: Medicare Other

## 2019-12-03 LAB — BMP8+EGFR
BUN/Creatinine Ratio: 21 (ref 12–28)
BUN: 28 mg/dL — ABNORMAL HIGH (ref 8–27)
CO2: 22 mmol/L (ref 20–29)
Calcium: 9.5 mg/dL (ref 8.7–10.3)
Chloride: 105 mmol/L (ref 96–106)
Creatinine, Ser: 1.33 mg/dL — ABNORMAL HIGH (ref 0.57–1.00)
GFR calc Af Amer: 41 mL/min/{1.73_m2} — ABNORMAL LOW (ref >59)
GFR calc non Af Amer: 35 mL/min/{1.73_m2} — ABNORMAL LOW (ref >59)
Glucose: 82 mg/dL (ref 65–99)
Potassium: 4.6 mmol/L (ref 3.5–5.2)
Sodium: 142 mmol/L (ref 134–144)

## 2019-12-03 LAB — CBC
Hematocrit: 36.6 % (ref 34.0–46.6)
Hemoglobin: 11.7 g/dL (ref 11.1–15.9)
MCH: 27.9 pg (ref 26.6–33.0)
MCHC: 32 g/dL (ref 31.5–35.7)
MCV: 87 fL (ref 79–97)
Platelets: 193 10*3/uL (ref 150–450)
RBC: 4.19 x10E6/uL (ref 3.77–5.28)
RDW: 13.8 % (ref 11.7–15.4)
WBC: 4.4 10*3/uL (ref 3.4–10.8)

## 2019-12-03 LAB — TSH: TSH: 2.55 u[IU]/mL (ref 0.450–4.500)

## 2019-12-09 ENCOUNTER — Other Ambulatory Visit: Payer: Self-pay | Admitting: Internal Medicine

## 2019-12-09 DIAGNOSIS — Z0000 Encounter for general adult medical examination without abnormal findings: Secondary | ICD-10-CM

## 2019-12-27 ENCOUNTER — Ambulatory Visit: Payer: Medicare Other

## 2019-12-27 DIAGNOSIS — I10 Essential (primary) hypertension: Secondary | ICD-10-CM

## 2019-12-27 DIAGNOSIS — N183 Chronic kidney disease, stage 3 unspecified: Secondary | ICD-10-CM

## 2019-12-28 ENCOUNTER — Encounter: Payer: Self-pay | Admitting: Internal Medicine

## 2019-12-28 NOTE — Patient Instructions (Signed)
Goals we discussed today:  Goals Addressed            This Visit's Progress   . Work with SW to manage care coordination needs       Timeframe:  Long-Range Goal Priority:  Low Start Date:  12.6.21                           Expected End Date: 3.1.22  Next expected date of contact: 2.22.22                      Patient Goals/Self-Care Activities Over the next 90 days, patient will:  . Patient will self administer medications as prescribed . Patient will attend all scheduled provider appointments . Patient will call pharmacy for medication refills . Patient will call provider office for new concerns or questions . Patient will work with BSW to address care coordination needs and will continue to work with the clinical team to address health care and disease management related needs.          Follow Up Plan: SW will follow up with patient by phone over the next 90 days.   Daneen Schick, BSW, CDP Social Worker, Certified Dementia Practitioner [place] [facility] [place] [facility] [phone]

## 2019-12-28 NOTE — Chronic Care Management (AMB) (Signed)
Chronic Care Management    Social Work Follow Up Note  12/27/2019 Name: Whitney Henderson MRN: [id number] DOB: 1928/09/18  Whitney Henderson is a 86 y.o. year old female who is a primary care patient of [name], MD. The CCM team was consulted for assistance with care coordination.   Review of patient status, including review of consultants reports, other relevant assessments, and collaboration with appropriate care team members and the patient's provider was performed as part of comprehensive patient evaluation and provision of chronic care management services.    SDOH (Social Determinants of Health) assessments performed: No SDOH Interventions     Most Recent Value  SDOH Interventions  Food Insecurity Interventions Intervention Not Indicated  Housing Interventions Intervention Not Indicated  Transportation Interventions Intervention Not Indicated       Outpatient Encounter Medications as of 12/27/2019  Medication Sig  . Alpha-D-Galactosidase (BEANO MELTAWAYS) TABS Take 1 tablet by mouth daily as needed (gas).  (Patient not taking: Reported on 08/16/2019)  . amLODipine (NORVASC) 2.5 MG tablet TAKE 1 TABLET(2.5 MG) BY MOUTH TWICE DAILY  . Apoaequorin (PREVAGEN PO) Take by mouth daily at 6 (six) AM.  . ASPIRIN 81 PO Take 81 mg by mouth as needed (for pain or headache).   . B Complex Vitamins (B COMPLEX PO) Take by mouth.  . betamethasone dipropionate (DIPROLENE) 0.05 % cream APPLY THIN LAYER EXTERNALLY TO THE AFFECTED AREA EVERY DAY AS NEEDED  . Calcium Carb-Cholecalciferol (CALCIUM 500 +D PO) Take by mouth. With 400 mg of vitd Doesn't take every day, only sometimes  . cholecalciferol (VITAMIN D) 1000 units tablet Take 1,000 Units by mouth daily.   [name] donepezil (ARICEPT) 5 MG tablet TAKE 1 TABLET(5 MG) BY MOUTH AT BEDTIME  . Ginkgo Biloba 120 MG CAPS Take 1 capsule by mouth daily at 12 noon.   . Multiple Vitamins-Minerals (CENTRUM SILVER PO) Take by mouth.  . Multiple  Vitamins-Minerals (PRESERVISION AREDS 2 PO) Take 1 tablet by mouth daily.   [name] Propylene Glycol (SYSTANE BALANCE OP) Place 1 drop into both eyes daily as needed (for itchy eyes). Uses when needed.   . sertraline (ZOLOFT) 25 MG tablet TAKE 1 TABLET(25 MG) BY MOUTH DAILY  . Theanine 100 MG CAPS Take 1 capsule by mouth daily at 12 noon.  . vitamin C (ASCORBIC ACID) 500 MG tablet Take 500 mg by mouth daily.   No facility-administered encounter medications on file as of 12/27/2019.     Patient Care Plan: Social Work Care Plan    Problem Identified: Care Coordination   Priority: Low  Onset Date: 12/27/2019    Long-Range Goal: Collaborate with RN Care Manager to perform appropriate assessments to assist with care coordination needs   Start Date: 12/27/2019  Expected End Date: 03/21/2020  This Visit's Progress: On track  Priority: Low  Note:   Current Barriers:  . Chronic disease management support and education needs related to HTN, CKD Stage III, and Hypertensive Nephropathy    Social Worker Clinical Goal(s):  [name] Over the next 120 days, patient will work with SW to identify and address any acute and/or chronic care coordination needs related to the self health management of HTN, CKD Stage III, and Hypertensive Nephropathy    CCM SW Interventions:  . Inter-disciplinary care team collaboration (see longitudinal plan of care) . Successful outbound call placed to the patient to assist with care coordination needs . Determined the patient has decided to un-enroll from [facility] program . Patient  reports her daughter is available to assist with food needs and has been cooking and freezing meals for the patient . Assessed for acute care coordination needs- none identified during today's call . Discussed long term follow up with SW while patient remains actively involved with  RN Case Manager  to address care management needs . Scheduled follow up call over the next 90 days  Patient  Goals/Self-Care Activities Over the next 90 days, patient will:   Patient will self administer medications as prescribed  Patient will attend all scheduled provider appointments  Patient will call pharmacy for medication refills  Patient will call provider office for new concerns or questions  Patient will work with BSW to address care coordination needs and will continue to work with the clinical team to address health care and disease management related needs.    Follow Up Plan: The patient has been provided with contact information for the care management team and has been advised to call with any health related questions or concerns.  The care management team will reach out to the patient again over the next 90 days.        Follow Up Plan: SW will follow up with patient by phone over the next 90 days.   Daneen Schick, BSW, CDP Social Worker, Certified Dementia Practitioner [facility] [facility] [phone]  Total time spent performing care coordination and/or care management activities with the patient by phone or face to face = 15 minutes.

## 2020-01-06 ENCOUNTER — Other Ambulatory Visit: Payer: Self-pay

## 2020-01-06 ENCOUNTER — Ambulatory Visit: Payer: Medicare Other | Admitting: Neurology

## 2020-01-06 ENCOUNTER — Encounter: Payer: Self-pay | Admitting: Neurology

## 2020-01-06 VITALS — BP 138/80 | HR 66 | Ht 61.0 in | Wt 121.8 lb

## 2020-01-06 DIAGNOSIS — R251 Tremor, unspecified: Secondary | ICD-10-CM

## 2020-01-06 DIAGNOSIS — R413 Other amnesia: Secondary | ICD-10-CM

## 2020-01-06 MED ORDER — DONEPEZIL HCL 5 MG PO TABS: ORAL_TABLET | ORAL | 5 refills | Status: DC

## 2020-01-06 NOTE — Patient Instructions (Signed)
Continue Aricept dosing Continue to follow tremor  See you back in 8 months

## 2020-01-06 NOTE — Progress Notes (Signed)
PATIENT: Whitney Henderson DOB: May 25, 1928  REASON FOR VISIT: follow up HISTORY FROM: patient  HISTORY OF PRESENT ILLNESS: Today 01/06/20 Whitney Henderson is an 86 year old female with history of headaches, memory trouble, and resting tremor to the right arm.  Tremor has been present for about the last 2 years.  Denies any changes to her walking, no falls.  She has occasional headaches affecting the left brow with sharp quality that comes and goes.  She lives alone at home, does not drive.  She manages her own affairs, household, and medications.  Her daughter prepares meals.  They go walking 3 days a week in the park.  She is sleeping well.  Tolerating Aricept 5 mg at bedtime.  At times, feels sad thinking about the loss of her husband 2 years ago.  Tremor to the right arm comes and goes, exacerbated during times of stress, "outside world".  Notes some difficulty with handwriting, it may be small.  Memory is overall stable, daughter notes she is forgetful, will ask the same question she asked the day before.  Here today accompanied by her daughter.  HISTORY 07/14/2019 Dr. Jannifer Henderson: Whitney Henderson is an 86 year old right-handed black female with a history of headaches affecting the left brow with a sharp quality that would come and go.  The patient has also been seen in the past for some troubles with memory, last seen about 3 years ago.  The patient comes in with a new problem with a resting tremor that developed on the right arm sometime within the last 2 years.  The patient comes in with her daughter today.  The patient denies any significant change in her walking, she has not had any falls.  She has not had any change in speech.  She does note that her handwriting is somewhat sloppy.  She still has occasional headaches.  She denies any significant changes in vision.  She currently lives alone but is not driving.  She still handles some of her finances, and keeps up with medications and appointments.  The daughter  indicates that she is somewhat forgetful.  She returns to the office today for further evaluation.   REVIEW OF SYSTEMS: Out of a complete 14 system review of symptoms, the patient complains only of the following symptoms, and all other reviewed systems are negative.  Tremor, memory loss  ALLERGIES: Allergies  Allergen Reactions  . Codeine Rash    HOME MEDICATIONS: Outpatient Medications Prior to Visit  Medication Sig Dispense Refill  . Alpha-D-Galactosidase (BEANO MELTAWAYS) TABS Take 1 tablet by mouth daily as needed (gas).    [name] amLODipine (NORVASC) 2.5 MG tablet TAKE 1 TABLET(2.5 MG) BY MOUTH TWICE DAILY 180 tablet 1  . Apoaequorin (PREVAGEN PO) Take by mouth daily at 6 (six) AM.    . ASPIRIN 81 PO Take 81 mg by mouth as needed (for pain or headache).     . B Complex Vitamins (B COMPLEX PO) Take by mouth.    . betamethasone dipropionate (DIPROLENE) 0.05 % cream APPLY THIN LAYER EXTERNALLY TO THE AFFECTED AREA EVERY DAY AS NEEDED 60 g 2  . Calcium Carb-Cholecalciferol (CALCIUM 500 +D PO) Take by mouth. With 400 mg of vitd Doesn't take every day, only sometimes    . cholecalciferol (VITAMIN D) 1000 units tablet Take 1,000 Units by mouth daily.     . Ginkgo Biloba 120 MG CAPS Take 1 capsule by mouth daily at 12 noon.     . Multiple Vitamins-Minerals (CENTRUM  SILVER PO) Take by mouth.    . Multiple Vitamins-Minerals (PRESERVISION AREDS 2 PO) Take 1 tablet by mouth daily.     [name] Propylene Glycol (SYSTANE BALANCE OP) Place 1 drop into both eyes daily as needed (for itchy eyes). Uses when needed.    . sertraline (ZOLOFT) 25 MG tablet TAKE 1 TABLET(25 MG) BY MOUTH DAILY 90 tablet 1  . Theanine 100 MG CAPS Take 1 capsule by mouth daily at 12 noon.    . vitamin C (ASCORBIC ACID) 500 MG tablet Take 500 mg by mouth daily.    [name] donepezil (ARICEPT) 5 MG tablet TAKE 1 TABLET(5 MG) BY MOUTH AT BEDTIME 30 tablet 3   No facility-administered medications prior to visit.    PAST MEDICAL  HISTORY: Past Medical History:  Diagnosis Date  . Anxiety   . Cataracts, bilateral   . Discoid lupus   . Headache 04/27/2015   Left>right periorbital  . Hypertension   . Memory difficulties 09/04/2016    PAST SURGICAL HISTORY: Past Surgical History:  Procedure Laterality Date  . ABDOMINAL HYSTERECTOMY    . BREAST EXCISIONAL BIOPSY Left over 16 ys ago   benign  . CHOLECYSTECTOMY    . EXCISIONAL HEMORRHOIDECTOMY      FAMILY HISTORY: Family History  Problem Relation Age of Onset  . Early death Mother   . Cancer Father   . Breast cancer Daughter        19s    SOCIAL HISTORY: Social History   Socioeconomic History  . Marital status: Widowed    Spouse name: Whitney Henderson   . Number of children: 4  . Years of education: Education officer, environmental  . Highest education level: Not on file  Occupational History  . Not on file  Tobacco Use  . Smoking status: Former Smoker    Years: 2.00    Types: Cigarettes  . Smokeless tobacco: Never Used  . Tobacco comment: smoked sometimes not daily  Vaping Use  . Vaping Use: Never used  Substance and Sexual Activity  . Alcohol use: No  . Drug use: No  . Sexual activity: Not Currently  Other Topics Concern  . Not on file  Social History Narrative   Live with husband,  4 children.    Caffeine: 1 cup daily    Social Determinants of Health   Financial Resource Strain: Low Risk   . Difficulty of Paying Living Expenses: Not hard at all  Food Insecurity: No Food Insecurity  . Worried About Charity fundraiser in the Last Year: Never true  . Ran Out of Food in the Last Year: Never true  Transportation Needs: No Transportation Needs  . Lack of Transportation (Medical): No  . Lack of Transportation (Non-Medical): No  Physical Activity: Sufficiently Active  . Days of Exercise per Week: 3 days  . Minutes of Exercise per Session: 60 min  Stress: No Stress Concern Present  . Feeling of Stress : Only a little  Social Connections: Not on file   Intimate Partner Violence: Not on file   PHYSICAL EXAM  Vitals:   01/06/20 1125  BP: 138/80  Pulse: 66  Weight: 121 lb 12.8 oz (55.2 kg)  Height: 5\' 1"  (1.549 m)   Body mass index is 23.01 kg/m.  Generalized: Well developed, in no acute distress  MMSE - Mini Mental State Exam 01/06/2020 07/14/2019 09/04/2016  Orientation to time 4 4 4   Orientation to Place 5 5 5   Registration 3 3 3   Attention/ Calculation  1 3 5   Recall 3 1 0  Language- name 2 objects 2 2 2   Language- repeat 0 1 1  Language- follow 3 step command 3 3 3   Language- read & follow direction 1 1 1   Write a sentence 1 1 1   Copy design 1 0 1  Total score 24 24 26     Neurological examination  Mentation: Alert oriented to time, place, history taking. Follows all commands speech and language fluent Cranial nerve II-XII: Pupils were equal round reactive to light. Extraocular movements were full, visual field were full on confrontational test. Facial sensation and strength were normal. Head turning and shoulder shrug  were normal and symmetric. Motor: The motor testing reveals 5 over 5 strength of all 4 extremities. Good symmetric motor tone is noted throughout.  Intermittent resting tremor seen to right hand.  Handwriting sample does show micrographia, with spiral draw, tremor is mildly translated. Sensory: Sensory testing is intact to soft touch on all 4 extremities. No evidence of extinction is noted.  Coordination: Cerebellar testing reveals good finger-nose-finger and heel-to-shin bilaterally.  No tremor seen. Gait and station: Can stand from seated position with arms crossed, but had to rock once, gait is normal,  good turns Reflexes: Deep tendon reflexes are symmetric and normal bilaterally.   DIAGNOSTIC DATA (LABS, IMAGING, TESTING) - I reviewed patient records, labs, notes, testing and imaging myself where available.  Lab Results  Component Value Date   WBC 4.4 12/02/2019   HGB 11.7 12/02/2019   HCT 36.6  12/02/2019   MCV 87 12/02/2019   PLT 193 12/02/2019      Component Value Date/Time   NA 142 12/02/2019 1107   K 4.6 12/02/2019 1107   CL 105 12/02/2019 1107   CO2 22 12/02/2019 1107   GLUCOSE 82 12/02/2019 1107   GLUCOSE 74 02/22/2018 1438   BUN 28 (H) 12/02/2019 1107   CREATININE 1.33 (H) 12/02/2019 1107   CALCIUM 9.5 12/02/2019 1107   PROT 7.2 05/19/2019 1116   ALBUMIN 4.0 02/18/2019 1153   AST 23 02/18/2019 1153   ALT 12 02/18/2019 1153   ALKPHOS 70 02/18/2019 1153   BILITOT 0.3 02/18/2019 1153   GFRNONAA 35 (L) 12/02/2019 1107   GFRAA 41 (L) 12/02/2019 1107   Lab Results  Component Value Date   CHOL 194 11/05/2018   HDL 80 11/05/2018   LDLCALC 103 (H) 11/05/2018   TRIG 57 11/05/2018   CHOLHDL 2.4 11/05/2018   No results found for: HGBA1C Lab Results  Component Value Date   VITAMINB12 752 08/16/2019   Lab Results  Component Value Date   TSH 2.550 12/02/2019      ASSESSMENT AND PLAN 86 y.o. year old female  has a past medical history of Anxiety, Cataracts, bilateral, Discoid lupus, Headache (04/27/2015), Hypertension, and Memory difficulties (09/04/2016). here with:  1.  Memory disturbance (MMSE 24/30) 2.  Resting tremor, right upper extremity 3.  Left frontal headache  Memory is overall stable, will continue on Aricept 5 mg at bedtime, desires to keep current dosing.  Resting tremor was present, also noted micrographia with handwriting sample, otherwise no findings suggestive of parkinsonism.  We will follow over time.  She will follow-up in 8 months or sooner if needed.  I spent 30 minutes of face-to-face and non-face-to-face time with patient.  This included previsit chart review, lab review, study review, order entry, electronic health record documentation, patient education.  Butler Denmark, AGNP-C, DNP 01/06/2020, 11:55 AM [facility] [phone]  [address], [place], [place] [address] [phone]

## 2020-01-07 NOTE — Progress Notes (Signed)
I have read the note, and I agree with the clinical assessment and plan.  Burgundy Matuszak K Birl Lobello   

## 2020-01-13 DIAGNOSIS — N183 Chronic kidney disease, stage 3 unspecified: Secondary | ICD-10-CM | POA: Diagnosis not present

## 2020-01-13 DIAGNOSIS — M329 Systemic lupus erythematosus, unspecified: Secondary | ICD-10-CM | POA: Diagnosis not present

## 2020-01-13 DIAGNOSIS — I129 Hypertensive chronic kidney disease with stage 1 through stage 4 chronic kidney disease, or unspecified chronic kidney disease: Secondary | ICD-10-CM | POA: Diagnosis not present

## 2020-01-24 ENCOUNTER — Telehealth: Payer: Medicare Other

## 2020-01-24 ENCOUNTER — Other Ambulatory Visit: Payer: Self-pay

## 2020-01-24 ENCOUNTER — Ambulatory Visit: Payer: Self-pay

## 2020-01-24 DIAGNOSIS — N183 Chronic kidney disease, stage 3 unspecified: Secondary | ICD-10-CM

## 2020-01-24 DIAGNOSIS — M810 Age-related osteoporosis without current pathological fracture: Secondary | ICD-10-CM

## 2020-01-24 DIAGNOSIS — I10 Essential (primary) hypertension: Secondary | ICD-10-CM

## 2020-01-25 ENCOUNTER — Telehealth: Payer: Self-pay | Admitting: *Deleted

## 2020-01-25 NOTE — Chronic Care Management (AMB) (Signed)
  Chronic Care Management   Note  01/25/2020 Name: Whitney Henderson MRN: [id number] DOB: [birth year]  Whitney Henderson is a 87 y.o. year old female who is a primary care patient of [name], MD. Whitney Henderson is currently enrolled in care management services. An additional referral for Pharmacy was placed.   Follow up plan: Telephone appointment with care management team member scheduled for: 01/27/2020  Gwenevere Ghazi  Care Guide, Embedded Care Coordination [facility] Management  Direct Dial [phone]

## 2020-01-25 NOTE — Chronic Care Management (AMB) (Signed)
  Chronic Care Management   Note  01/25/2020 Name: Whitney Henderson MRN: [id number] DOB: 1928-06-02  [name] is a 87 y.o. year old female who is a primary care patient of [name], MD. [name] is currently enrolled in care management services. An additional referral for Pharmacy was placed.   Follow up plan: A telephone outreach attempt made spoke to patient she is not able to schedule pharmacy appointment at this time she took down return call information and will call back. The care management team will reach out to the patient again over the next 7 days. If patient returns call to provider office, please advise to call Embedded Care Management Care Guide Gwenevere Ghazi at [phone].  Gwenevere Ghazi  Care Guide, Embedded Care Coordination [facility] Management

## 2020-01-26 ENCOUNTER — Telehealth: Payer: Self-pay

## 2020-01-26 NOTE — Chronic Care Management (AMB) (Signed)
Chronic Care Management Pharmacy Assistant   Name: Whitney Henderson  MRN: [id number] DOB: December 04, 1928  Reason for Encounter: Medication Review - Initial Questions for Pharmacist Visit on 01/27/2020 @ 10:30.  Questions:  Have you seen any other providers since your last visit? Yes. 12/02/2019 - [name], MD (PCP) Hypertensive Nephropathy  Any changes in your medications or health? No   Any side effects from any medications? No  Do you have an symptoms or problems not managed by your medications? Patient states she has issues with blood pressure fluctuations.  Any concerns about your health right now? No   Has your provider asked that you check blood pressure, blood sugar, or follow special diet at home? Patient was not clear about if she takes at home.  Do you get any type of exercise on a regular basis No.  Can you think of a goal you would like to reach for your health? No  Do you have any problems getting your medications? No  Is there anything that you would like to discuss during the appointment? No   Patient Is aware to have  medications and supplements available at phone appointment  PCP : [name], MD  Allergies:   Allergies  Allergen Reactions   Codeine Rash    Medications: Outpatient Encounter Medications as of 01/26/2020  Medication Sig   Alpha-D-Galactosidase (BEANO MELTAWAYS) TABS Take 1 tablet by mouth daily as needed (gas).   amLODipine (NORVASC) 2.5 MG tablet TAKE 1 TABLET(2.5 MG) BY MOUTH TWICE DAILY   Apoaequorin (PREVAGEN PO) Take by mouth daily at 6 (six) AM.   ASPIRIN 81 PO Take 81 mg by mouth as needed (for pain or headache).    B Complex Vitamins (B COMPLEX PO) Take by mouth.   betamethasone dipropionate (DIPROLENE) 0.05 % cream APPLY THIN LAYER EXTERNALLY TO THE AFFECTED AREA EVERY DAY AS NEEDED   Calcium Carb-Cholecalciferol (CALCIUM 500 +D PO) Take by mouth. With 400 mg of vitd Doesn't take every day, only sometimes    cholecalciferol (VITAMIN D) 1000 units tablet Take 1,000 Units by mouth daily.    donepezil (ARICEPT) 5 MG tablet TAKE 1 TABLET(5 MG) BY MOUTH AT BEDTIME   Ginkgo Biloba 120 MG CAPS Take 1 capsule by mouth daily at 12 noon.    Multiple Vitamins-Minerals (CENTRUM SILVER PO) Take by mouth.   Multiple Vitamins-Minerals (PRESERVISION AREDS 2 PO) Take 1 tablet by mouth daily.    Propylene Glycol (SYSTANE BALANCE OP) Place 1 drop into both eyes daily as needed (for itchy eyes). Uses when needed.   sertraline (ZOLOFT) 25 MG tablet TAKE 1 TABLET(25 MG) BY MOUTH DAILY   Theanine 100 MG CAPS Take 1 capsule by mouth daily at 12 noon.   vitamin C (ASCORBIC ACID) 500 MG tablet Take 500 mg by mouth daily.   No facility-administered encounter medications on file as of 01/26/2020.    Current Diagnosis: Patient Active Problem List   Diagnosis Date Noted   Tremor 01/06/2020   Age-related osteoporosis with current pathological fracture 11/05/2018   Lupus (HCC) 11/05/2018   Grief reaction 03/31/2018   Hypertensive nephropathy 03/03/2018   Chronic renal disease, stage III (HCC) 12/11/2017   Hypertension, essential, benign 10/30/2017   Anxiety 10/11/2017   Memory disorder 09/04/2016   Headache 04/27/2015   Hypertension 01/16/2013   UTI (lower urinary tract infection) 01/16/2013      Follow-Up:  Pharmacist Review  I also reminded her of her visit 01/27/2019@ 10:30 .  Loma Boston  Pearson , CPP Notified  Judithann Sheen, [place] Pharmacist Assistant [phone]

## 2020-01-26 NOTE — Chronic Care Management (AMB) (Signed)
Chronic Care Management   Follow Up Note   01/24/2020 Name: Whitney Henderson MRN: [id number] DOB: 07/23/1928  Referred by: [name], MD Reason for referral : Chronic Care Management (RNCM FU Call )   Whitney Henderson is a 87 y.o. year old female who is a primary care patient of [name], MD. The CCM team was consulted for assistance with chronic disease management and care coordination needs.    Review of patient status, including review of consultants reports, relevant laboratory and other test results, and collaboration with appropriate care team members and the patient's provider was performed as part of comprehensive patient evaluation and provision of chronic care management services.    SDOH (Social Determinants of Health) assessments performed: Yes - no acute challenges identified See Care Plan activities for detailed interventions related to [place])   Placed outbound CCM RN CM follow up call to patient for a care plan update.     Outpatient Encounter Medications as of 01/24/2020  Medication Sig  . Alpha-D-Galactosidase (BEANO MELTAWAYS) TABS Take 1 tablet by mouth daily as needed (gas).  [name] amLODipine (NORVASC) 2.5 MG tablet TAKE 1 TABLET(2.5 MG) BY MOUTH TWICE DAILY  . Apoaequorin (PREVAGEN PO) Take by mouth daily at 6 (six) AM.  . ASPIRIN 81 PO Take 81 mg by mouth as needed (for pain or headache).   . B Complex Vitamins (B COMPLEX PO) Take by mouth.  . betamethasone dipropionate (DIPROLENE) 0.05 % cream APPLY THIN LAYER EXTERNALLY TO THE AFFECTED AREA EVERY DAY AS NEEDED  . Calcium Carb-Cholecalciferol (CALCIUM 500 +D PO) Take by mouth. With 400 mg of vitd Doesn't take every day, only sometimes  . cholecalciferol (VITAMIN D) 1000 units tablet Take 1,000 Units by mouth daily.   [name] donepezil (ARICEPT) 5 MG tablet TAKE 1 TABLET(5 MG) BY MOUTH AT BEDTIME  . Ginkgo Biloba 120 MG CAPS Take 1 capsule by mouth daily at 12 noon.   . Multiple Vitamins-Minerals (CENTRUM SILVER PO)  Take by mouth.  . Multiple Vitamins-Minerals (PRESERVISION AREDS 2 PO) Take 1 tablet by mouth daily.   [name] Propylene Glycol (SYSTANE BALANCE OP) Place 1 drop into both eyes daily as needed (for itchy eyes). Uses when needed.  . sertraline (ZOLOFT) 25 MG tablet TAKE 1 TABLET(25 MG) BY MOUTH DAILY  . Theanine 100 MG CAPS Take 1 capsule by mouth daily at 12 noon.  . vitamin C (ASCORBIC ACID) 500 MG tablet Take 500 mg by mouth daily.   No facility-administered encounter medications on file as of 01/24/2020.     Objective:  No results found for: HGBA1C Lab Results  Component Value Date   MICROALBUR 10 02/18/2019   LDLCALC 103 (H) 11/05/2018   CREATININE 1.33 (H) 12/02/2019   BP Readings from Last 3 Encounters:  01/06/20 138/80  12/02/19 126/74  12/02/19 126/74    Goals Addressed       Patient Care Plan: Social Work Care Plan    Problem Identified: Care Coordination   Priority: Low  Onset Date: 12/27/2019    Long-Range Goal: Collaborate with RN Care Manager to perform appropriate assessments to assist with care coordination needs   Start Date: 12/27/2019  Expected End Date: 03/21/2020  This Visit's Progress: On track  Priority: Low  Note:   Current Barriers:  . Chronic disease management support and education needs related to HTN, CKD Stage III, and Hypertensive Nephropathy   Social Worker Clinical Goal(s):  [name] Over the next 120 days, patient will work with  SW to identify and address any acute and/or chronic care coordination needs related to the self health management of HTN, CKD Stage III, and Hypertensive Nephropathy   CCM SW Interventions:  . Inter-disciplinary care team collaboration (see longitudinal plan of care) . Successful outbound call placed to the patient to assist with care coordination needs . Determined the patient has decided to un-enroll from [facility] program . Patient reports her daughter is available to assist with food needs and has been  cooking and freezing meals for the patient . Assessed for acute care coordination needs- none identified during today's call . Discussed long term follow up with SW while patient remains actively involved with  RN Case Manager  to address care management needs . Scheduled follow up call over the next 90 days Patient Goals/Self-Care Activities . Over the next 90 days, patient will:   - Patient will self administer medications as prescribed Patient will attend all scheduled provider appointments Patient will call pharmacy for medication refills Patient will call provider office for new concerns or questions Patient will work with BSW to address care coordination needs and will continue to work with the clinical team to address health care and disease management related needs.   Follow Up Plan: The patient has been provided with contact information for the care management team and has been advised to call with any health related questions or concerns.  The care management team will reach out to the patient again over the next 90 days.     Patient Care Plan: Chronic Kidney (Adult)    Problem Identified: Disease Progression   Priority: High    Long-Range Goal: Disease Progression Prevented or Minimized   Start Date: 01/24/2020  Expected End Date: 04/24/2020  This Visit's Progress: On track  Priority: High  Note:   Current Barriers:   Ineffective Self Health Maintenance  Impaired memory   Currently UNABLE TO independently self manage needs related to chronic health conditions.   Knowledge Deficits related to short term plan for care coordination needs and long term plans for chronic disease management needs Nurse Case Manager Clinical Goal(s):   Over the next 90 days, patient will work with care management team to address care coordination and chronic disease management needs related to Disease Management  Educational Needs  Care Coordination  Medication Management and  Education  Psychosocial Support   Interventions:   Educated patient and daughter about basic chronic kidney disease including stages of chronic kidney disease  Reviewed patient's current GFR and stage of CKD  Determined patient is established with Nephrology and is being monitored every 3 months   Educated on importance of staying well hydrated and to follow Nephrology recommendations for any fluid restrictions  Educated daughter on dietary recommendations  Sent printed educational materials related to diet and salt substitutes for kidney patients  Patient Goals/Self Care Activities:  Over the next 90 days, patient will  - choose foods low in fat and sugar - choose foods that are low in sodium (salt) - follow diet recommendations provided by kidney doctor  - watch for swelling in feet, ankles and legs every day  - review printed educational materials related to what foods to eat with kidney disease; best salt substitutes for kidney patients  - stay well hydrated and follow fluid restrictions if any provided by kidney doctor   Follow Up Plan: Telephone follow up appointment with care management team member scheduled for: 03/01/20    Patient Care Plan: Hypertension (  Adult)    Problem Identified: Hypertension (Hypertension)   Priority: High    Goal: Hypertension Monitored   Start Date: 01/24/2020  Expected End Date: 03/09/2020  This Visit's Progress: On track  Priority: High  Note:   Objective:  . Last practice recorded BP readings:  BP Readings from Last 3 Encounters:  01/06/20 138/80  12/02/19 126/74  12/02/19 126/74 .   [name] Most recent eGFR/CrCl: No results found for: EGFR  No components found for: CRCL Current Barriers:  [name] Knowledge Deficits related to basic understanding of hypertension pathophysiology and self care management . Knowledge Deficits related to understanding of medications prescribed for management of hypertension . Cognitive Deficits Case Manager Clinical  Goal(s):  [name] Over the next 30 days, patient will verbalize understanding of plan for hypertension management Interventions:  . Evaluation of current treatment plan related to hypertension self management and patient's adherence to plan as established by provider. . Provided education to patient re: stroke prevention, s/s of heart attack and stroke, DASH diet, complications of uncontrolled blood pressure . Reviewed medications with patient and discussed importance of compliance . Discussed plans with patient for ongoing care management follow up and provided patient with direct contact information for care management team . Advised patient, providing education and rationale, to monitor blood pressure daily and record, calling PCP for findings outside established parameters.  . Reviewed scheduled/upcoming provider appointments including: f/u with embedded Pharm D for review of medications and discuss issue with cutting Amlodipine tablet . Sent embedded Pharm D referral marked Urgent due to patient is having difficulty cutting her Amlodipine tablet as directed by PCP Patient Goals/Self-Care Activities . Over the next 30 days, patient will:  - check blood pressure 3 times per week - write blood pressure results in a log or diary  - speak with the embedded Pharm D for review of current pharmacological BP regimen and address problem with cutting Amlodipine  - change positions slowly when standing and or changing positions - check [employer] OTC benefit for purchase of a reacher and use this device to pick things up from the floor to help avoid bending over  - keep PCP well informed of abnormal BP readings and or persistent "light headedness" when changing positions Follow Up Plan: Telephone follow up appointment with care management team member scheduled for: 03/01/20     Plan:   Telephone follow up appointment with care management team member scheduled for: 03/01/20  Barb Merino, RN, BSN, CCM Care  Management Coordinator [facility]  Direct Phone: [phone]

## 2020-01-26 NOTE — Patient Instructions (Signed)
Visit Information  Goals    . Manage My Diet for CKD     Timeframe:  Long-Range Goal Priority:  High Start Date:  01/24/20                            Expected End Date:  04/23/20                     Follow Up Date: 03/01/20    - choose foods low in fat and sugar - choose foods that are low in sodium (salt) - follow diet recommendations provided by kidney doctor  - watch for swelling in feet, ankles and legs every day  - review printed educational materials related to what foods to eat with kidney disease; best salt substitutes for kidney patients  - stay well hydrated and follow fluid restrictions if any provided by kidney doctor    Why is this important?    A healthy diet is important for mental and physical health.   Healthy food helps repair damaged body tissue and maintains strong bones and muscles.   No single food is just right so eating a variety of proteins, fruits, vegetables and grains is best.   You may need to change what you eat or drink to manage kidney disease.   A dietitian is the best person to guide you.     Notes:     . Track and Manage My Blood Pressure-Hypertension     Timeframe:  Long-Range Goal Priority:  High Start Date:   01/24/20                          Expected End Date: 03/09/20                     Follow Up Date: 03/01/20   Over the next 30 days, patient will:  - check blood pressure 3 times per week - write blood pressure results in a log or diary  - speak with the embedded Pharm D for review of current pharmacological BP regimen and address problem with cutting Amlodipine  - change positions slowly when standing and or changing positions - check [employer] OTC benefit for purchase of a reacher and use this device to pick things up from the floor to help avoid bending over  - keep PCP well informed of abnormal BP readings and or persistent "light headedness" when changing positions   Why is this important?    You won't feel high blood pressure, but it  can still hurt your blood vessels.   High blood pressure can cause heart or kidney problems. It can also cause a stroke.   Making lifestyle changes like losing a Jaiden [place]ahab weight or eating less salt will help.   Checking your blood pressure at home and at different times of the day can help to control blood pressure.   If the doctor prescribes medicine remember to take it the way the doctor ordered.   Call the office if you cannot afford the medicine or if there are questions about it.     Notes:     . Work with SW to manage care coordination needs     Timeframe:  Long-Range Goal Priority:  Low Start Date:  12.6.21  Expected End Date: 3.1.22  Next expected date of contact: 2.22.22                      Patient Goals/Self-Care Activities Over the next 90 days, patient will:  . Patient will self administer medications as prescribed . Patient will attend all scheduled provider appointments . Patient will call pharmacy for medication refills . Patient will call provider office for new concerns or questions . Patient will work with BSW to address care coordination needs and will continue to work with the clinical team to address health care and disease management related needs.      The patient verbalized understanding of instructions, educational materials, and care plan provided today and declined offer to receive copy of patient instructions, educational materials, and care plan.   Telephone follow up appointment with care management team member scheduled for: 03/01/20  Lynne Logan, RN

## 2020-01-26 NOTE — Chronic Care Management (AMB) (Signed)
Chronic Care Management Pharmacy  Name: Whitney Henderson  MRN: [id number] DOB: Oct 15, 1928    Chief Complaint/ HPI  Whitney Henderson,  87 y.o. , female presents for their Initial CCM visit with the clinical pharmacist via telephone due to COVID-19 Pandemic. 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 She got her Master's at [employer]. She was married for over 65 years, her husband is deceased and passed 2 years ago. She will soon be 87 years old. Whitney Henderson her daughter moved to [place] and got her masters at the [employer], she retired from [employer] and moved back 5 years ago. She moved back to be closer to her parents. She and her parents traveled often. They are able to do whatever they would like to do. [facility], and they are in different organizations. Whitney Henderson was sleeping well, but now she gets up at night to go to the bathroom, she has been trying to stop going to the bathroom. She usually has to go to the bathroom around 3 AM. She usually has her last glass of water around 6 PM or 7 PM. She reports that she is not drinking a lot of water about a half a glass water. She eats dinner at 6 PM and has a cup of ginger ale, or a sip of water with dinner. Then right before bed she might have a 1/2 glass of water she feels like she has to go to the bathroom at 3 am.   PCP : [name], MD  Their chronic conditions include: Hypertension, Chronic Renal Disease, Memory Disorder , Lupus  Office Visits:  12/02/2019 MD OV: Advised to consider magnesium supplementation   08/16/2019 MD OV:  Vitamin B12 1000 mcg injection given  Consult Visit: 07/14/2019: [facility]: Patient to continue on Aricept 5 mg tablet at bedtime, MMSE score 24/30-stable to be seen in 8 months   CCM Consult:  12/27/2019: CCM SW  12/02/2019 LPN: Medicare Wellness visit completed  10/12/19: CCM SW  08/24/19: CCM  RN  Medications: Outpatient Encounter Medications as of 01/27/2020  Medication Sig  . Alpha-D-Galactosidase (BEANO MELTAWAYS) TABS Take 1 tablet by mouth daily as needed (gas).  [name] amLODipine (NORVASC) 2.5 MG tablet TAKE 1 TABLET(2.5 MG) BY MOUTH TWICE DAILY  . Apoaequorin (PREVAGEN PO) Take by mouth daily at 6 (six) AM.  . ASPIRIN 81 PO Take 81 mg by mouth as needed (for pain or headache).   . B Complex Vitamins (B COMPLEX PO) Take by mouth.  . betamethasone dipropionate (DIPROLENE) 0.05 % cream APPLY THIN LAYER EXTERNALLY TO THE AFFECTED AREA EVERY DAY AS NEEDED  . Calcium Carb-Cholecalciferol (CALCIUM 500 +D PO) Take by mouth. With 400 mg of vitd Doesn't take every day, only sometimes  . cholecalciferol (VITAMIN D) 1000 units tablet Take 1,000 Units by mouth daily.   [name] donepezil (ARICEPT) 5 MG tablet TAKE 1 TABLET(5 MG) BY MOUTH AT BEDTIME  . Ginkgo Biloba 120 MG CAPS Take 1 capsule by mouth daily at 12 noon.   . Multiple Vitamins-Minerals (CENTRUM SILVER PO) Take by mouth.  . Multiple Vitamins-Minerals (PRESERVISION AREDS 2 PO) Take 1 tablet by mouth daily.   [name] Propylene Glycol (SYSTANE BALANCE OP) Place 1 drop into both eyes daily as needed (for itchy eyes). Uses when needed.  . sertraline (ZOLOFT) 25 MG tablet TAKE 1 TABLET(25 MG) BY MOUTH DAILY  . Theanine 100 MG  CAPS Take 1 capsule by mouth daily at 12 noon.  . vitamin C (ASCORBIC ACID) 500 MG tablet Take 500 mg by mouth daily.   No facility-administered encounter medications on file as of 01/27/2020.     Current Diagnosis/Assessment:  Goals Addressed            This Visit's Progress   . Pharmacy Care Plan       CARE PLAN ENTRY (see longitudinal plan of care for additional care plan information)  Current Barriers:  . Chronic Disease Management support, education, and care coordination needs related to Hypertension, Hyperlipidemia, Depression, and Osteoporosis   Hypertension BP Readings from Last 3 Encounters:  01/06/20  138/80  12/02/19 126/74  12/02/19 126/74   . Pharmacist Clinical Goal(s): o Over the next 90 days, patient will work with PharmD and providers to maintain BP goal <140/90 . Current regimen:  o Amlodipine 2.5 mg tablet daily . Interventions: o Patient will take medication at same time each day o Will help patient determine which BP monitor is covered through [facility]  catalog with a smaller cuff  . Patient self care activities - Over the next 90 days, patient will: o Check BP once a day, document, and provide at future appointments o Ensure daily salt intake < 2300 mg/day  Hyperlipidemia Lab Results  Component Value Date/Time   LDLCALC 103 (H) 11/05/2018 10:59 AM   . Pharmacist Clinical Goal(s): o Over the next 90 days, patient will work with PharmD and providers to maintain LDL goal < 100 . Current regimen:  o Not taking any medication  . Interventions: o Increasing the amount of exercise that she does by walking with daughter 3 or more times per week o Decreasing the amount of fried fatty food the patient eats  . Patient self care activities - Over the next 90 days, patient will: o Walk with her daughter at least three times per week   Anxiety . Pharmacist Clinical Goal(s): o Over the next 90 days, patient will work with PharmD and providers to maintain energy to do things  . Current regimen:  o Sertraline 25 mg tablet daily . Interventions: o The importance of medication adherence everyday  . Patient self care activities - Over the next 90 days, patient will: o Take medication everyday Osteoperosis . Pharmacist Clinical Goal(s) o Over the next 90 days, patient will work with PharmD and providers to take medication everyday . Current regimen:  o Calcium 500 + D- taking 1 tablet every other day  o Cholecalciferol- Taking 1 tablet by mouth at bedtime. . Interventions: o Consider intake of 1232m of calcium daily through diet and/or supplementation o DEXA scan o Consider  intake of [phone] units of vitamin D through supplementation  . Patient self care activities - Over the next 90 days, patient will: o Continue to take her medication every day  o Have a DEXA scan  Medication management . Pharmacist Clinical Goal(s): o Over the next 90 days, patient will work with PharmD and providers to maintain optimal medication adherence . [facility] . Interventions o Comprehensive medication review performed. o Continue current medication management strategy . Patient self care activities - Over the next 90 days, patient will: o Focus on medication adherence by using pill packs and keeping a routine o Take medications as prescribed o Report any questions or concerns to PharmD and/or provider(s)  Initial goal documentation       Social Determinants of Health with Concerns   Tobacco  Use: Medium Risk  . Smoking Tobacco Use: Former Smoker  . Smokeless Tobacco Use: Never Used  Social Connections: Not on file  Intimate Partner Violence: Not on file  Depression (PHQ2-9): Medium Risk  . PHQ-2 Score: 6  Alcohol Screen: Not on file     Hypertension   BP today is:  <140/90  Office blood pressures are  BP Readings from Last 3 Encounters:  01/06/20 138/80  12/02/19 126/74  12/02/19 126/74    Patient has failed these meds in the past: Olmesartan 40 mg, Amlodipine 5 mg  At goal on: Amlodipine 2.5 mg tablet twice per day   Patient checks BP at home several times per month   We discussed the following: Patient home BP readings are ranging:  -She checks her BP when she feels it is elevated -Patient was taking one tablet in the morning and one at night.   -Dr. Baird Cancer would like her to take a whole tablet in the morning and a 1/2 tablet at night.  -Has BP monitor but she is concerned about the size of the cuff because her arm is small  -[facility] - Find out about where to order a smaller size cuff -Pt has access to [place] and will be ordering a BP cuff from the catalog  -They have a contact [phone]              - Credits expire every 3 months.    -Her daughter will find out more  About credits  -Pt's daughter reports that sometimes she has a drop in her BP   -Pt to start checking her BP in the morning after she takes her medicine  Plan  Continue current medications  CPA to call the patient [facility] insurance to follow up on BP monitor options and to check on patients BP for a smaller arm   Hyperlipidemia   LDL goal < 100  Last lipids Lab Results  Component Value Date   CHOL 194 11/05/2018   HDL 80 11/05/2018   LDLCALC 103 (H) 11/05/2018   TRIG 57 11/05/2018   CHOLHDL 2.4 11/05/2018   Hepatic Function Latest Ref Rng & Units 05/19/2019 02/18/2019 10/30/2017  Total Protein 6.0 - 8.5 g/dL 7.2 7.3 7.6  Albumin 3.6 - 4.6 g/dL - 4.0 4.3  AST 0 - 40 IU/L - 23 19  ALT 0 - 32 IU/L - 12 12  Alk Phosphatase 39 - 117 IU/L - 70 65  Total Bilirubin 0.0 - 1.2 mg/dL - 0.3 0.5     The ASCVD Risk score (Sal[place]., et al., 2013) failed to calculate for the following reasons:   The 2013 ASCVD risk score is only valid for ages 33 to 37   Patient has failed these meds in past: none noted  Patient is currently uncontrolled on the following medications:  -Not taking any medication   We discussed  the following:   Exercise and Diet extensively   Patient is eating a lot of fruits and vegetables  Is walking with daughter at least three times per week.   Plan  Continue control with diet and exercise   Chronic Kidney Disease 3b   Lab Results  Component Value Date   CREATININE 1.33 (H) 12/02/2019   BUN 28 (H) 12/02/2019   GFRNONAA 35 (L) 12/02/2019   GFRAA 41 (L) 12/02/2019   NA 142 12/02/2019   K 4.6 12/02/2019   CALCIUM 9.5 12/02/2019   CO2 22 12/02/2019  Patient has failed these meds in past: Olmesartan 40, amlodipine 5 mg Patient is currently uncontrolled on the following  medications:  . Amlodipine 2.5 mg - take daily   We discussed:   -Drinking 6 glasses of water a day  -Angel CCM RN has sent information on the kidney diet and what she should be eating.   Plan  Continue current medications   Anxiety    Depression screen [facility] 2/9 12/02/2019 05/19/2019 12/01/2018  Decreased Interest 1 0 0  Down, Depressed, Hopeless _0 PHQ - 2 Score _1 Altered sleeping 0 0 0  Tired, decreased energy _2 Change in appetite 0 0 0  Feeling bad or failure about yourself  0 0 0  Trouble concentrating 0 0 0  Moving slowly or fidgety/restless 0 0 0  Suicidal thoughts 0 0 0  PHQ-9 Score _3 Difficult doing work/chores Not difficult at all Not difficult at all Not difficult at all    Patient has failed these meds in past: none Patient is currently controlled on the following medications:  . Sertraline 25 mg- take daily  We discussed:  -Will discuss at follow up visit.   Plan  Continue current medications   Osteopenia / Osteoporosis   Last DEXA Scan: 08/12/2018  T-Score femoral neck: -2.8  T-Score lumbar spine: -2.4    Last vitamin D/Calcium Lab Results  Component Value Date   CALCIUM 9.5 12/02/2019    Patient is a candidate for pharmacologic treatment due to T-Score < -2.5 in femoral neck   Patient has failed these meds in past: Ibandronate 150 mg - take every 30 days  Patient is currently uncontrolled on the following medications:   Calcium Carbonate-Cholecalciferol 500 MG +D take everyday   Cholecalciferol 1000 unit tablet take daily  We discussed:   -Consider intake of 1272m of calcium daily through diet and/or supplementation -Consider intake of [phone] units of vitamin D through supplementation  Plan -Saw a plan to start Prolia in 2020 at that time medication was to expensive, will revisit or give other options  -Patient to have another DEXA scan completed this year   Continue current medications  Dementia     MMSE -  Mini Mental State Exam 01/06/2020 07/14/2019 09/04/2016  Orientation to time _4 Orientation to Place _5 Registration _6 Attention/ Calculation _7 Recall 3 1 0  Language- name 2 objects _8 Language- repeat 0 1 1  Language- follow 3 step command _9 Language- read & follow direction _10 Write a sentence _11 Copy design 1 0 1  Total score _12 Patient has failed these meds in past: none noted  Patient is currently controlled on the following medications:  . Donepezil 5 mg take daily  We discussed:    Patient taking medication at bedtime  [facility]   Taking walks three times per week with her daughter   Maybe doing puzzles and games with her daughter   Plan  Continue current medications  Health Maintenance   Patient is currently controlled on the following medications:  . Prevagen - take 1 tablet daily . B Complex- take daily . Centrum Silver -take daily . Preservision (AREDS)- take dialy . Theanine 100 mg- take 1 capsule by mouth daily . Vitamin C  500 mg -take daily  We discussed:   -will discuss at next office visit  Plan  Continue current medications   Medication Management   Patient's preferred pharmacy is:  [facility] [place], [address] [place] [address] Phone: [phone] Fax: [phone]  Uses pill box? Yes - uses a weekly pill organizer Sunday - Saturday  Pt endorses 99 % compliance  We discussed: Discussed benefits of medication synchronization, packaging and delivery as well as enhanced pharmacist oversight with [facility].  Plan  Utilize [facility] pharmacy for medication synchronization, packaging and delivery    Follow up:  3 month phone visit  [name], PharmD Clinical Pharmacist [facility] [phone]

## 2020-01-27 ENCOUNTER — Telehealth: Payer: Medicare Other

## 2020-01-27 ENCOUNTER — Ambulatory Visit: Payer: Medicare Other

## 2020-01-27 DIAGNOSIS — N183 Chronic kidney disease, stage 3 unspecified: Secondary | ICD-10-CM

## 2020-01-27 DIAGNOSIS — I129 Hypertensive chronic kidney disease with stage 1 through stage 4 chronic kidney disease, or unspecified chronic kidney disease: Secondary | ICD-10-CM

## 2020-01-27 DIAGNOSIS — M8000XA Age-related osteoporosis with current pathological fracture, unspecified site, initial encounter for fracture: Secondary | ICD-10-CM

## 2020-01-27 DIAGNOSIS — F419 Anxiety disorder, unspecified: Secondary | ICD-10-CM

## 2020-01-27 DIAGNOSIS — I10 Essential (primary) hypertension: Secondary | ICD-10-CM

## 2020-01-27 DIAGNOSIS — R413 Other amnesia: Secondary | ICD-10-CM

## 2020-02-01 ENCOUNTER — Telehealth: Payer: Medicare Other

## 2020-02-09 NOTE — Patient Instructions (Addendum)
Visit Information  Goals Addressed            This Visit's Progress   . Pharmacy Care Plan       CARE PLAN ENTRY (see longitudinal plan of care for additional care plan information)  Current Barriers:  . Chronic Disease Management support, education, and care coordination needs related to Hypertension, Hyperlipidemia, Depression, and Osteoporosis   Hypertension BP Readings from Last 3 Encounters:  01/06/20 138/80  12/02/19 126/74  12/02/19 126/74   . Pharmacist Clinical Goal(s): o Over the next 90 days, patient will work with PharmD and providers to maintain BP goal <140/90 . Current regimen:  o Amlodipine 2.5 mg tablet daily . Interventions: o Patient will take medication at same time each day o Will help patient determine which BP monitor is covered through [facility]  catalog with a smaller cuff  . Patient self care activities - Over the next 90 days, patient will: o Check BP once a day, document, and provide at future appointments o Ensure daily salt intake < 2300 mg/day  Hyperlipidemia Lab Results  Component Value Date/Time   LDLCALC 103 (H) 11/05/2018 10:59 AM   . Pharmacist Clinical Goal(s): o Over the next 90 days, patient will work with PharmD and providers to maintain LDL goal < 100 . Current regimen:  o Not taking any medication  . Interventions: o Increasing the amount of exercise that she does by walking with daughter 3 or more times per week o Decreasing the amount of fried fatty food the patient eats  . Patient self care activities - Over the next 90 days, patient will: o Walk with her daughter at least three times per week   Anxiety . Pharmacist Clinical Goal(s): o Over the next 90 days, patient will work with PharmD and providers to maintain energy to do things  . Current regimen:  o Sertraline 25 mg tablet daily . Interventions: o The importance of medication adherence everyday  . Patient self care activities - Over the next 90 days, patient  will: o Take medication everyday Osteoperosis . Pharmacist Clinical Goal(s) o Over the next 90 days, patient will work with PharmD and providers to take medication everyday . Current regimen:  o Calcium 500 + D- taking 1 tablet every other day  o Cholecalciferol- Taking 1 tablet by mouth at bedtime. . Interventions: o Consider intake of 1200mg  of calcium daily through diet and/or supplementation o DEXA scan o Consider intake of [phone] units of vitamin D through supplementation  . Patient self care activities - Over the next 90 days, patient will: o Continue to take her medication every day  o Have a DEXA scan  Medication management . Pharmacist Clinical Goal(s): o Over the next 90 days, patient will work with PharmD and providers to maintain optimal medication adherence . [facility] . Interventions o Comprehensive medication review performed. o Continue current medication management strategy . Patient self care activities - Over the next 90 days, patient will: o Focus on medication adherence by using pill packs and keeping a routine o Take medications as prescribed o Report any questions or concerns to PharmD and/or provider(s)  Initial goal documentation        Ms. Eiben was given information about Chronic Care Management services today including:  1. CCM service includes personalized support from designated clinical staff supervised by her physician, including individualized plan of care and coordination with other care providers 2. 24/7 contact phone numbers for assistance for urgent and routine  care needs. 3. Standard insurance, coinsurance, copays and deductibles apply for chronic care management only during months in which we provide at least 20 minutes of these services. Most insurances cover these services at 100%, however patients may be responsible for any copay, coinsurance and/or deductible if applicable. This service may help you avoid the need  for more expensive face-to-face services. 4. Only one practitioner may furnish and bill the service in a calendar month. 5. The patient may stop CCM services at any time (effective at the end of the month) by phone call to the office staff.  Patient agreed to services and verbal consent obtained.   The patient verbalized understanding of instructions, educational materials, and care plan provided today and agreed to receive a mailed copy of patient instructions, educational materials, and care plan.  The pharmacy team will reach out to the patient again over the next 90 days.   Mayford Knife, [facility]

## 2020-02-10 ENCOUNTER — Telehealth: Payer: Self-pay

## 2020-02-10 NOTE — Chronic Care Management (AMB) (Signed)
° ° °  Chronic Care Management Pharmacy Assistant   Name: Whitney Henderson  MRN: [id number] DOB: Apr 10, 1928  Reason for Encounter: Medication Review   Allergies:   Allergies  Allergen Reactions   Codeine Rash    Medications: Outpatient Encounter Medications as of 02/10/2020  Medication Sig   Alpha-D-Galactosidase (BEANO MELTAWAYS) TABS Take 1 tablet by mouth daily as needed (gas).   amLODipine (NORVASC) 2.5 MG tablet TAKE 1 TABLET(2.5 MG) BY MOUTH TWICE DAILY   Apoaequorin (PREVAGEN PO) Take by mouth daily at 6 (six) AM.   ASPIRIN 81 PO Take 81 mg by mouth as needed (for pain or headache).    B Complex Vitamins (B COMPLEX PO) Take by mouth.   betamethasone dipropionate (DIPROLENE) 0.05 % cream APPLY THIN LAYER EXTERNALLY TO THE AFFECTED AREA EVERY DAY AS NEEDED   Calcium Carb-Cholecalciferol (CALCIUM 500 +D PO) Take by mouth. With 400 mg of vitd Doesn't take every day, only sometimes   cholecalciferol (VITAMIN D) 1000 units tablet Take 1,000 Units by mouth daily.    donepezil (ARICEPT) 5 MG tablet TAKE 1 TABLET(5 MG) BY MOUTH AT BEDTIME   Ginkgo Biloba 120 MG CAPS Take 1 capsule by mouth daily at 12 noon.    Multiple Vitamins-Minerals (CENTRUM SILVER PO) Take by mouth.   Multiple Vitamins-Minerals (PRESERVISION AREDS 2 PO) Take 1 tablet by mouth daily.    Propylene Glycol (SYSTANE BALANCE OP) Place 1 drop into both eyes daily as needed (for itchy eyes). Uses when needed.   sertraline (ZOLOFT) 25 MG tablet TAKE 1 TABLET(25 MG) BY MOUTH DAILY   Theanine 100 MG CAPS Take 1 capsule by mouth daily at 12 noon.   vitamin C (ASCORBIC ACID) 500 MG tablet Take 500 mg by mouth daily.   No facility-administered encounter medications on file as of 02/10/2020.    Current Diagnosis: Patient Active Problem List   Diagnosis Date Noted   Tremor 01/06/2020   Age-related osteoporosis with current pathological fracture 11/05/2018   Lupus ([place]) 11/05/2018   Grief reaction  03/31/2018   Hypertensive nephropathy 03/03/2018   Chronic renal disease, stage III ([place]) 12/11/2017   Hypertension, essential, benign 10/30/2017   Anxiety 10/11/2017   Memory disorder 09/04/2016   Headache 04/27/2015   Hypertension 01/16/2013   UTI (lower urinary tract infection) 01/16/2013      Follow-Up:  Pharmacist Review Called [employer] about small blood pressure cuff, per representative patient does qualify for a blood pressure cuff the cost is 37.99 but patient has enough points to get for free. Per representative they will mail out as soon as order is placed.  Called daughter to give her number of [employer], so she can order small blood pressure cuff, I told her that she would just have to tell representative the size she has now, and tell her that she needs a smaller size and they will make sure patient get correct size. Daughter states she will call them because they need some other things ordered. I told her if we could be of any more help to give [place] a call. Voiced understanding.  Vallie Pearson,CPP Notified  Judithann Sheen, [facility] Clinical Pharmacist Assistant [phone]

## 2020-02-16 ENCOUNTER — Telehealth: Payer: Self-pay

## 2020-02-16 NOTE — Chronic Care Management (AMB) (Signed)
° ° °  Chronic Care Management Pharmacy Assistant   Name: Whitney Henderson  MRN: [id number] DOB: April 15, 1928  Reason for Encounter: Medication Review    PCP : [name], MD  Allergies:   Allergies  Allergen Reactions   Codeine Rash    Medications: Outpatient Encounter Medications as of 02/16/2020  Medication Sig   Alpha-D-Galactosidase (BEANO MELTAWAYS) TABS Take 1 tablet by mouth daily as needed (gas).   amLODipine (NORVASC) 2.5 MG tablet TAKE 1 TABLET(2.5 MG) BY MOUTH TWICE DAILY   Apoaequorin (PREVAGEN PO) Take by mouth daily at 6 (six) AM.   ASPIRIN 81 PO Take 81 mg by mouth as needed (for pain or headache).    B Complex Vitamins (B COMPLEX PO) Take by mouth.   betamethasone dipropionate (DIPROLENE) 0.05 % cream APPLY THIN LAYER EXTERNALLY TO THE AFFECTED AREA EVERY DAY AS NEEDED   Calcium Carb-Cholecalciferol (CALCIUM 500 +D PO) Take by mouth. With 400 mg of vitd Doesn't take every day, only sometimes   cholecalciferol (VITAMIN D) 1000 units tablet Take 1,000 Units by mouth daily.    donepezil (ARICEPT) 5 MG tablet TAKE 1 TABLET(5 MG) BY MOUTH AT BEDTIME   Ginkgo Biloba 120 MG CAPS Take 1 capsule by mouth daily at 12 noon.    Multiple Vitamins-Minerals (CENTRUM SILVER PO) Take by mouth.   Multiple Vitamins-Minerals (PRESERVISION AREDS 2 PO) Take 1 tablet by mouth daily.    Propylene Glycol (SYSTANE BALANCE OP) Place 1 drop into both eyes daily as needed (for itchy eyes). Uses when needed.   sertraline (ZOLOFT) 25 MG tablet TAKE 1 TABLET(25 MG) BY MOUTH DAILY   Theanine 100 MG CAPS Take 1 capsule by mouth daily at 12 noon.   vitamin C (ASCORBIC ACID) 500 MG tablet Take 500 mg by mouth daily.   No facility-administered encounter medications on file as of 02/16/2020.    Current Diagnosis: Patient Active Problem List   Diagnosis Date Noted   Tremor 01/06/2020   Age-related osteoporosis with current pathological fracture 11/05/2018   Lupus ([place])  11/05/2018   Grief reaction 03/31/2018   Hypertensive nephropathy 03/03/2018   Chronic renal disease, stage III ([place]) 12/11/2017   Hypertension, essential, benign 10/30/2017   Anxiety 10/11/2017   Memory disorder 09/04/2016   Headache 04/27/2015   Hypertension 01/16/2013   UTI (lower urinary tract infection) 01/16/2013      Follow-Up:  Pharmacist Review  Reviewed chart and adherence measures. Per [employer] data Total Gaps - All measures are (1) . Total Gaps - Star Measures are ()) 1. Annual Wellness not performed .  Vallie Pearson,CPP Notified  Judithann Sheen, [facility] Clinical Pharmacist Assistant [phone]

## 2020-02-28 ENCOUNTER — Ambulatory Visit (INDEPENDENT_AMBULATORY_CARE_PROVIDER_SITE_OTHER): Payer: Medicare Other | Admitting: Internal Medicine

## 2020-02-28 ENCOUNTER — Encounter: Payer: Self-pay | Admitting: Internal Medicine

## 2020-02-28 ENCOUNTER — Other Ambulatory Visit: Payer: Self-pay

## 2020-02-28 VITALS — BP 118/74 | HR 64 | Temp 98.4°F | Ht 59.6 in | Wt 121.0 lb

## 2020-02-28 DIAGNOSIS — N390 Urinary tract infection, site not specified: Secondary | ICD-10-CM | POA: Diagnosis not present

## 2020-02-28 DIAGNOSIS — I129 Hypertensive chronic kidney disease with stage 1 through stage 4 chronic kidney disease, or unspecified chronic kidney disease: Secondary | ICD-10-CM | POA: Diagnosis not present

## 2020-02-28 DIAGNOSIS — H6121 Impacted cerumen, right ear: Secondary | ICD-10-CM | POA: Diagnosis not present

## 2020-02-28 DIAGNOSIS — R8290 Unspecified abnormal findings in urine: Secondary | ICD-10-CM | POA: Diagnosis not present

## 2020-02-28 DIAGNOSIS — M7918 Myalgia, other site: Secondary | ICD-10-CM | POA: Diagnosis not present

## 2020-02-28 DIAGNOSIS — N183 Chronic kidney disease, stage 3 unspecified: Secondary | ICD-10-CM | POA: Diagnosis not present

## 2020-02-28 DIAGNOSIS — E559 Vitamin D deficiency, unspecified: Secondary | ICD-10-CM | POA: Diagnosis not present

## 2020-02-28 DIAGNOSIS — Z0000 Encounter for general adult medical examination without abnormal findings: Secondary | ICD-10-CM | POA: Diagnosis not present

## 2020-02-28 LAB — POCT URINALYSIS DIPSTICK
Bilirubin, UA: NEGATIVE
Blood, UA: NEGATIVE
Glucose, UA: NEGATIVE
Ketones, UA: NEGATIVE
Nitrite, UA: POSITIVE
Protein, UA: NEGATIVE
Spec Grav, UA: 1.015 (ref 1.010–1.025)
Urobilinogen, UA: 0.2 E.U./dL
pH, UA: 6.5 (ref 5.0–8.0)

## 2020-02-28 LAB — POCT UA - MICROALBUMIN
Albumin/Creatinine Ratio, Urine, POC: 300
Creatinine, POC: 100 mg/dL
Microalbumin Ur, POC: 150 mg/L

## 2020-02-28 NOTE — Patient Instructions (Signed)
Hypertension, Adult Hypertension is another name for high blood pressure. High blood pressure forces your heart to work harder to pump blood. This can cause problems over time. There are two numbers in a blood pressure reading. There is a top number (systolic) over a bottom number (diastolic). It is best to have a blood pressure that is below 120/80. Healthy choices can help lower your blood pressure, or you may need medicine to help lower it. What are the causes? The cause of this condition is not known. Some conditions may be related to high blood pressure. What increases the risk?  Smoking.  Having type 2 diabetes mellitus, high cholesterol, or both.  Not getting enough exercise or physical activity.  Being overweight.  Having too much fat, sugar, calories, or salt (sodium) in your diet.  Drinking too much alcohol.  Having long-term (chronic) kidney disease.  Having a family history of high blood pressure.  Age. Risk increases with age.  Race. You may be at higher risk if you are African American.  Gender. Men are at higher risk than women before age 45. After age 65, women are at higher risk than men.  Having obstructive sleep apnea.  Stress. What are the signs or symptoms?  High blood pressure may not cause symptoms. Very high blood pressure (hypertensive crisis) may cause: ? Headache. ? Feelings of worry or nervousness (anxiety). ? Shortness of breath. ? Nosebleed. ? A feeling of being sick to your stomach (nausea). ? Throwing up (vomiting). ? Changes in how you see. ? Very bad chest pain. ? Seizures. How is this treated?  This condition is treated by making healthy lifestyle changes, such as: ? Eating healthy foods. ? Exercising more. ? Drinking less alcohol.  Your health care provider may prescribe medicine if lifestyle changes are not enough to get your blood pressure under control, and if: ? Your top number is above 130. ? Your bottom number is above  80.  Your personal target blood pressure may vary. Follow these instructions at home: Eating and drinking  If told, follow the DASH eating plan. To follow this plan: ? Fill one half of your plate at each meal with fruits and vegetables. ? Fill one fourth of your plate at each meal with whole grains. Whole grains include whole-wheat pasta, brown rice, and whole-grain bread. ? Eat or drink low-fat dairy products, such as skim milk or low-fat yogurt. ? Fill one fourth of your plate at each meal with low-fat (lean) proteins. Low-fat proteins include fish, chicken without skin, eggs, beans, and tofu. ? Avoid fatty meat, cured and processed meat, or chicken with skin. ? Avoid pre-made or processed food.  Eat less than 1,500 mg of salt each day.  Do not drink alcohol if: ? Your doctor tells you not to drink. ? You are pregnant, may be pregnant, or are planning to become pregnant.  If you drink alcohol: ? Limit how much you use to:  0-1 drink a day for women.  0-2 drinks a day for men. ? Be aware of how much alcohol is in your drink. In the [place] one drink equals one 12 oz bottle of beer (355 mL), one 5 oz glass of wine (148 mL), or one 1 oz glass of hard liquor (44 mL).   Lifestyle  Work with your doctor to stay at a healthy weight or to lose weight. Ask your doctor what the best weight is for you.  Get at least 30 minutes of exercise most   days of the week. This may include walking, swimming, or biking.  Get at least 30 minutes of exercise that strengthens your muscles (resistance exercise) at least 3 days a week. This may include lifting weights or doing Pilates.  Do not use any products that contain nicotine or tobacco, such as cigarettes, e-cigarettes, and chewing tobacco. If you need help quitting, ask your doctor.  Check your blood pressure at home as told by your doctor.  Keep all follow-up visits as told by your doctor. This is important.   Medicines  Take over-the-counter  and prescription medicines only as told by your doctor. Follow directions carefully.  Do not skip doses of blood pressure medicine. The medicine does not work as well if you skip doses. Skipping doses also puts you at risk for problems.  Ask your doctor about side effects or reactions to medicines that you should watch for. Contact a doctor if you:  Think you are having a reaction to the medicine you are taking.  Have headaches that keep coming back (recurring).  Feel dizzy.  Have swelling in your ankles.  Have trouble with your vision. Get help right away if you:  Get a very bad headache.  Start to feel mixed up (confused).  Feel weak or numb.  Feel faint.  Have very bad pain in your: ? Chest. ? Belly (abdomen).  Throw up more than once.  Have trouble breathing. Summary  Hypertension is another name for high blood pressure.  High blood pressure forces your heart to work harder to pump blood.  For most people, a normal blood pressure is less than 120/80.  Making healthy choices can help lower blood pressure. If your blood pressure does not get lower with healthy choices, you may need to take medicine. This information is not intended to replace advice given to you by your health care provider. Make sure you discuss any questions you have with your health care provider. Document Revised: 09/17/2017 Document Reviewed: 09/17/2017 Elsevier Patient Education  2021 [employer] Health Maintenance, Female Adopting a healthy lifestyle and getting preventive care are important in promoting health and wellness. Ask your health care provider about:  The right schedule for you to have regular tests and exams.  Things you can do on your own to prevent diseases and keep yourself healthy. What should I know about diet, weight, and exercise? Eat a healthy diet  Eat a diet that includes plenty of vegetables, fruits, low-fat dairy products, and lean protein.  Do not eat a lot  of foods that are high in solid fats, added sugars, or sodium.   Maintain a healthy weight Body mass index (BMI) is used to identify weight problems. It estimates body fat based on height and weight. Your health care provider can help determine your BMI and help you achieve or maintain a healthy weight. Get regular exercise Get regular exercise. This is one of the most important things you can do for your health. Most adults should:  Exercise for at least 150 minutes each week. The exercise should increase your heart rate and make you sweat (moderate-intensity exercise).  Do strengthening exercises at least twice a week. This is in addition to the moderate-intensity exercise.  Spend less time sitting. Even light physical activity can be beneficial. Watch cholesterol and blood lipids Have your blood tested for lipids and cholesterol at 85 years of age, then have this test every 5 years. Have your cholesterol levels checked more often if:  Your lipid or   cholesterol levels are high.  You are older than 85 years of age.  You are at high risk for heart disease. What should I know about cancer screening? Depending on your health history and family history, you may need to have cancer screening at various ages. This may include screening for:  Breast cancer.  Cervical cancer.  Colorectal cancer.  Skin cancer.  Lung cancer. What should I know about heart disease, diabetes, and high blood pressure? Blood pressure and heart disease  High blood pressure causes heart disease and increases the risk of stroke. This is more likely to develop in people who have high blood pressure readings, are of African descent, or are overweight.  Have your blood pressure checked: ? Every 3-5 years if you are 18-39 years of age. ? Every year if you are 40 years old or older. Diabetes Have regular diabetes screenings. This checks your fasting blood sugar level. Have the screening done:  Once every three  years after age 40 if you are at a normal weight and have a low risk for diabetes.  More often and at a younger age if you are overweight or have a high risk for diabetes. What should I know about preventing infection? Hepatitis B If you have a higher risk for hepatitis B, you should be screened for this virus. Talk with your health care provider to find out if you are at risk for hepatitis B infection. Hepatitis C Testing is recommended for:  Everyone born from 1945 through 1965.  Anyone with known risk factors for hepatitis C. Sexually transmitted infections (STIs)  Get screened for STIs, including gonorrhea and chlamydia, if: ? You are sexually active and are younger than 85 years of age. ? You are older than 85 years of age and your health care provider tells you that you are at risk for this type of infection. ? Your sexual activity has changed since you were last screened, and you are at increased risk for chlamydia or gonorrhea. Ask your health care provider if you are at risk.  Ask your health care provider about whether you are at high risk for HIV. Your health care provider may recommend a prescription medicine to help prevent HIV infection. If you choose to take medicine to prevent HIV, you should first get tested for HIV. You should then be tested every 3 months for as long as you are taking the medicine. Pregnancy  If you are about to stop having your period (premenopausal) and you may become pregnant, seek counseling before you get pregnant.  Take 400 to 800 micrograms (mcg) of folic acid every day if you become pregnant.  Ask for birth control (contraception) if you want to prevent pregnancy. Osteoporosis and menopause Osteoporosis is a disease in which the bones lose minerals and strength with aging. This can result in bone fractures. If you are 65 years old or older, or if you are at risk for osteoporosis and fractures, ask your health care provider if you should:  Be  screened for bone loss.  Take a calcium or vitamin D supplement to lower your risk of fractures.  Be given hormone replacement therapy (HRT) to treat symptoms of menopause. Follow these instructions at home: Lifestyle  Do not use any products that contain nicotine or tobacco, such as cigarettes, e-cigarettes, and chewing tobacco. If you need help quitting, ask your health care provider.  Do not use street drugs.  Do not share needles.  Ask your health care provider for   help if you need support or information about quitting drugs. Alcohol use  Do not drink alcohol if: ? Your health care provider tells you not to drink. ? You are pregnant, may be pregnant, or are planning to become pregnant.  If you drink alcohol: ? Limit how much you use to 0-1 drink a day. ? Limit intake if you are breastfeeding.  Be aware of how much alcohol is in your drink. In the [place] one drink equals one 12 oz bottle of beer (355 mL), one 5 oz glass of wine (148 mL), or one 1 oz glass of hard liquor (44 mL). General instructions  Schedule regular health, dental, and eye exams.  Stay current with your vaccines.  Tell your health care provider if: ? You often feel depressed. ? You have ever been abused or do not feel safe at home. Summary  Adopting a healthy lifestyle and getting preventive care are important in promoting health and wellness.  Follow your health care provider's instructions about healthy diet, exercising, and getting tested or screened for diseases.  Follow your health care provider's instructions on monitoring your cholesterol and blood pressure. This information is not intended to replace advice given to you by your health care provider. Make sure you discuss any questions you have with your health care provider. Document Revised: 12/31/2017 Document Reviewed: 12/31/2017 Elsevier Patient Education  2021 Elsevier Inc.  

## 2020-02-28 NOTE — Progress Notes (Signed)
Rutherford Nail as a scribe for Maximino Greenland, MD.,have documented all relevant documentation on the behalf of Maximino Greenland, MD,as directed by  Maximino Greenland, MD while in the presence of Maximino Greenland, MD. This visit occurred during the SARS-CoV-2 public health emergency.  Safety protocols were in place, including screening questions prior to the visit, additional usage of staff PPE, and extensive cleaning of exam room while observing appropriate contact time as indicated for disinfecting solutions.  Subjective:     Patient ID: Whitney Henderson , female    DOB: 04-Nov-1928 , 87 y.o.   MRN: [id number]   Chief Complaint  Patient presents with  . Annual Exam  . Hypertension    HPI  She is here today for a full physical examination. She is no longer followed by GYN. She reports compliance with meds. She denies headaches, chest pain and shortness of breath. She is accompanied by her daughter today. Pt states she feels fine and is without complaints.   Hypertension This is a chronic problem. The current episode started more than 1 year ago. The problem has been gradually improving since onset. The problem is controlled. Pertinent negatives include no blurred vision, chest pain, palpitations or shortness of breath. Risk factors for coronary artery disease include post-menopausal state. Past treatments include calcium channel blockers. The current treatment provides moderate improvement. Compliance problems include exercise.  Hypertensive end-organ damage includes kidney disease.     Past Medical History:  Diagnosis Date  . Anxiety   . Cataracts, bilateral   . Discoid lupus   . Headache 04/27/2015   Left>right periorbital  . Hypertension   . Memory difficulties 09/04/2016     Family History  Problem Relation Age of Onset  . Early death Mother   . Cancer Father   . Breast cancer Daughter        81s     Current Outpatient Medications:  .  Alpha-D-Galactosidase (BEANO  MELTAWAYS) TABS, Take 1 tablet by mouth daily as needed (gas)., Disp: , Rfl:  .  amLODipine (NORVASC) 2.5 MG tablet, TAKE 1 TABLET(2.5 MG) BY MOUTH TWICE DAILY, Disp: 180 tablet, Rfl: 1 .  ASPIRIN 81 PO, Take 81 mg by mouth as needed (for pain or headache). , Disp: , Rfl:  .  B Complex Vitamins (B COMPLEX PO), Take by mouth., Disp: , Rfl:  .  betamethasone dipropionate (DIPROLENE) 0.05 % cream, APPLY THIN LAYER EXTERNALLY TO THE AFFECTED AREA EVERY DAY AS NEEDED, Disp: 60 g, Rfl: 2 .  Calcium Carb-Cholecalciferol (CALCIUM 500 +D PO), Take by mouth. With 400 mg of vitd Doesn't take every day, only sometimes, Disp: , Rfl:  .  cholecalciferol (VITAMIN D) 1000 units tablet, Take 1,000 Units by mouth daily. , Disp: , Rfl:  .  donepezil (ARICEPT) 5 MG tablet, TAKE 1 TABLET(5 MG) BY MOUTH AT BEDTIME, Disp: 30 tablet, Rfl: 5 .  Ginkgo Biloba 120 MG CAPS, Take 1 capsule by mouth daily at 12 noon. , Disp: , Rfl:  .  Multiple Vitamins-Minerals (CENTRUM SILVER PO), Take by mouth., Disp: , Rfl:  .  Multiple Vitamins-Minerals (PRESERVISION AREDS 2 PO), Take 1 tablet by mouth daily. , Disp: , Rfl:  .  Propylene Glycol (SYSTANE BALANCE OP), Place 1 drop into both eyes daily as needed (for itchy eyes). Uses when needed., Disp: , Rfl:  .  sertraline (ZOLOFT) 25 MG tablet, TAKE 1 TABLET(25 MG) BY MOUTH DAILY, Disp: 90 tablet, Rfl: 1 .  Theanine  100 MG CAPS, Take 1 capsule by mouth daily at 12 noon., Disp: , Rfl:  .  vitamin C (ASCORBIC ACID) 500 MG tablet, Take 500 mg by mouth daily., Disp: , Rfl:  .  Apoaequorin (PREVAGEN PO), Take by mouth daily at 6 (six) AM., Disp: , Rfl:    Allergies  Allergen Reactions  . Codeine Rash      The patient states she uses post menopausal status for birth control. Last LMP was No LMP recorded. Patient has had a hysterectomy.. Negative for Dysmenorrhea. Negative for: breast discharge, breast lump(s), breast pain and breast self exam. Associated symptoms include abnormal vaginal  bleeding. Pertinent negatives include abnormal bleeding (hematology), anxiety, decreased libido, depression, difficulty falling sleep, dyspareunia, history of infertility, nocturia, sexual dysfunction, sleep disturbances, urinary incontinence, urinary urgency, vaginal discharge and vaginal itching. Diet regular.The patient states her exercise level is  intermittent.   . The patient's tobacco use is:  Social History   Tobacco Use  Smoking Status Former Smoker  . Years: 2.00  . Types: Cigarettes  Smokeless Tobacco Never Used  Tobacco Comment   smoked sometimes not daily  . She has been exposed to passive smoke. The patient's alcohol use is:  Social History   Substance and Sexual Activity  Alcohol Use No   Review of Systems  Constitutional: Negative.   HENT: Negative.   Eyes: Negative.  Negative for blurred vision.  Respiratory: Negative.  Negative for shortness of breath.   Cardiovascular: Negative.  Negative for chest pain and palpitations.  Gastrointestinal: Negative.   Endocrine: Negative.   Genitourinary: Negative.   Musculoskeletal: Positive for arthralgias.  Skin: Negative.   Allergic/Immunologic: Negative.   Neurological: Negative.   Hematological: Negative.   Psychiatric/Behavioral: Negative.      Today's Vitals   02/28/20 1048  BP: 118/74  Pulse: 64  Temp: 98.4 F (36.9 C)  TempSrc: Oral  Weight: 121 lb (54.9 kg)  Height: 4' 11.6" (1.514 m)   Body mass index is 23.95 kg/m.   Wt Readings from Last 3 Encounters:  02/28/20 121 lb (54.9 kg)  01/06/20 121 lb 12.8 oz (55.2 kg)  12/02/19 119 lb (54 kg)    Objective:  Physical Exam Vitals and nursing note reviewed.  Constitutional:      Appearance: Normal appearance.  HENT:     Head: Normocephalic and atraumatic.     Right Ear: Ear canal and external ear normal. There is impacted cerumen.     Left Ear: Tympanic membrane, ear canal and external ear normal.     Nose:     Comments: masked    Mouth/Throat:      Comments: masked Eyes:     Extraocular Movements: Extraocular movements intact.     Conjunctiva/sclera: Conjunctivae normal.     Pupils: Pupils are equal, round, and reactive to light.  Cardiovascular:     Rate and Rhythm: Normal rate and regular rhythm.     Pulses: Normal pulses.     Heart sounds: Normal heart sounds.  Pulmonary:     Effort: Pulmonary effort is normal.     Breath sounds: Normal breath sounds.  Chest:  Breasts:     Tanner Score is 5.     Right: Normal.     Left: Normal.    Abdominal:     General: Abdomen is flat. Bowel sounds are normal.     Palpations: Abdomen is soft.  Genitourinary:    Comments: deferred Musculoskeletal:        General:  Normal range of motion.     Cervical back: Normal range of motion and neck supple.  Skin:    General: Skin is warm and dry.  Neurological:     General: No focal deficit present.     Mental Status: She is alert. Mental status is at baseline.  Psychiatric:        Mood and Affect: Mood normal.        Behavior: Behavior normal.         Assessment And Plan:     1. Routine general medical examination at health care facility Comments: A full exam was performed. Importance of monthly self breast exams was discussed with the patient. PATIENT IS ADVISED TO GET 30-45 MINUTES REGULAR EXERCISE NO LESS THAN FOUR TO FIVE DAYS PER WEEK - BOTH WEIGHTBEARING EXERCISES AND AEROBIC ARE RECOMMENDED.  PATIENT IS ADVISED TO FOLLOW A HEALTHY DIET WITH AT LEAST SIX FRUITS/VEGGIES PER DAY, DECREASE INTAKE OF RED MEAT, AND TO INCREASE FISH INTAKE TO TWO DAYS PER WEEK.  MEATS/FISH SHOULD NOT BE FRIED, BAKED OR BROILED IS PREFERABLE.  I SUGGEST WEARING SPF 50 SUNSCREEN ON EXPOSED PARTS AND ESPECIALLY WHEN IN THE DIRECT SUNLIGHT FOR AN EXTENDED PERIOD OF TIME.  PLEASE AVOID FAST FOOD RESTAURANTS AND INCREASE YOUR WATER INTAKE.  2. Hypertensive nephropathy Comments: Chronic, well controlled. She will c/w amlodipine. She is encouraged to follow a  low salt diet. EKG performed, SB w/ nonspecific T wave abnormality. She will f/u in four to six months for re-evaluation.  - POCT Urinalysis Dipstick (81002) - POCT UA - Microalbumin - EKG 12-Lead - CMP14+EGFR - Lipid panel  3. Stage 3 chronic kidney disease, unspecified whether stage 3a or 3b CKD (HCC) Comments: Chronic. I will check renal labs as listed below.  She verbally acknowledges understanding that adequate hydration and continued BP control are important to decrease risk of progression of CKD.  - Protein electrophoresis, serum - Parathyroid Hormone, Intact w/Ca - Phosphorus  4. Right ear impacted cerumen AFTER OBTAINING VERBAL CONSENT, RIGHT EAR WAS FLUSHED BY IRRIGATION. SHE TOLERATED PROCEDURE WELL WITHOUT ANY COMPLICATIONS. NO TM ABNORMALITIES WERE NOTED.  - Ear Lavage  5. Musculoskeletal pain Comments: Advised to take Tylenol prn. She will let me know if her sx persist.   6. Vitamin D deficiency disease Comments: I will check vitamin D level and supplement as needed.  - VITAMIN D 25 Hydroxy (Vit-D Deficiency, Fractures)  7. Abnormal urine Comments: I will send off a urine culture.  - Culture, Urine  Patient was given opportunity to ask questions. Patient verbalized understanding of the plan and was able to repeat key elements of the plan. All questions were answered to their satisfaction.   I, Maximino Greenland, MD, have reviewed all documentation for this visit. The documentation on 03/11/20 for the exam, diagnosis, procedures, and orders are all accurate and complete.  THE PATIENT IS ENCOURAGED TO PRACTICE SOCIAL DISTANCING DUE TO THE COVID-19 PANDEMIC.

## 2020-02-29 ENCOUNTER — Telehealth: Payer: Self-pay

## 2020-02-29 NOTE — Chronic Care Management (AMB) (Signed)
Chronic Care Management Pharmacy Assistant   Name: Whitney Henderson  MRN: [id number] DOB: 1928-06-07  Reason for Encounter: Hypertension/Hyperlipidemia Adherence Call  Patient Questions:  1.  Have you seen any other providers since your last visit? Yes, 02/28/20-Sanders, Bailey Mech, MD (OV).     2.  Any changes in your medicines or health? No    PCP : [name], MD  Allergies:   Allergies  Allergen Reactions   Codeine Rash    Medications: Outpatient Encounter Medications as of 02/29/2020  Medication Sig   Alpha-D-Galactosidase (BEANO MELTAWAYS) TABS Take 1 tablet by mouth daily as needed (gas).   amLODipine (NORVASC) 2.5 MG tablet TAKE 1 TABLET(2.5 MG) BY MOUTH TWICE DAILY   Apoaequorin (PREVAGEN PO) Take by mouth daily at 6 (six) AM.   ASPIRIN 81 PO Take 81 mg by mouth as needed (for pain or headache).    B Complex Vitamins (B COMPLEX PO) Take by mouth.   betamethasone dipropionate (DIPROLENE) 0.05 % cream APPLY THIN LAYER EXTERNALLY TO THE AFFECTED AREA EVERY DAY AS NEEDED   Calcium Carb-Cholecalciferol (CALCIUM 500 +D PO) Take by mouth. With 400 mg of vitd Doesn't take every day, only sometimes   cholecalciferol (VITAMIN D) 1000 units tablet Take 1,000 Units by mouth daily.    donepezil (ARICEPT) 5 MG tablet TAKE 1 TABLET(5 MG) BY MOUTH AT BEDTIME   Ginkgo Biloba 120 MG CAPS Take 1 capsule by mouth daily at 12 noon.    Multiple Vitamins-Minerals (CENTRUM SILVER PO) Take by mouth.   Multiple Vitamins-Minerals (PRESERVISION AREDS 2 PO) Take 1 tablet by mouth daily.    Propylene Glycol (SYSTANE BALANCE OP) Place 1 drop into both eyes daily as needed (for itchy eyes). Uses when needed.   sertraline (ZOLOFT) 25 MG tablet TAKE 1 TABLET(25 MG) BY MOUTH DAILY   Theanine 100 MG CAPS Take 1 capsule by mouth daily at 12 noon.   vitamin C (ASCORBIC ACID) 500 MG tablet Take 500 mg by mouth daily.   No facility-administered encounter medications on file as of 02/29/2020.     Current Diagnosis: Patient Active Problem List   Diagnosis Date Noted   Tremor 01/06/2020   Age-related osteoporosis with current pathological fracture 11/05/2018   Grief reaction 03/31/2018   Hypertensive nephropathy 03/03/2018   Chronic renal disease, stage III ([place]) 12/11/2017   Hypertension, essential, benign 10/30/2017   Anxiety 10/11/2017   Memory disorder 09/04/2016   Headache 04/27/2015   Hypertension 01/16/2013   UTI (lower urinary tract infection) 01/16/2013   Recent Relevant Labs: Lab Results  Component Value Date/Time   MICROALBUR 150 02/28/2020 12:49 PM   MICROALBUR 10 02/18/2019 01:02 PM    Kidney Function Lab Results  Component Value Date/Time   CREATININE 1.33 (H) 12/02/2019 11:07 AM   CREATININE 1.35 (H) 08/16/2019 12:38 PM   GFRNONAA 35 (L) 12/02/2019 11:07 AM   GFRAA 41 (L) 12/02/2019 11:07 AM    03/01/2020 Name: Whitney Henderson MRN: [id number] DOB: 06/15/1928 [name] is a 87 y.o. year old female who is a primary care patient of [name], MD.  Comprehensive medication review performed; Spoke to patient regarding cholesterol  Lipid Panel    Component Value Date/Time   CHOL 194 11/05/2018 1059   TRIG 57 11/05/2018 1059   HDL 80 11/05/2018 1059   [place] 103 (H) 11/05/2018 1059    10-year ASCVD risk score: The ASCVD Risk score Mikey Bussing DC Jr., et al., 2013) failed to calculate for the following  reasons:   The 2013 ASCVD risk score is only valid for ages 16 to 32   Current antihyperlipidemic regimen:  o None   Previous antihyperlipidemic medications tried: none noted.   ASCVD risk enhancing conditions: age >6 and HTN    What recent interventions/DTPs have been made by any provider to improve Cholesterol control since last CPP Visit:  - Recent interventions was to increasing the amount of exercise that she does by walking with daughter 3 or more times per week, Decreasing the amount of fried fatty food the patient  eats.   Any recent hospitalizations or ED visits since last visit with CPP? No    What diet changes have been made to improve Cholesterol?  o Patient reports she had a boost drink this morning. Patient typically eats egg whites, drink a cup of tea, drinks plenty of water, eats cereal during the day.   What exercise is being done to improve Cholesterol?  o Patient reports she walks in the park with daughter, and goes to the [facility].  Adherence Review: Does the patient have >5 day gap between last estimated fill dates? No   Reviewed chart prior to disease state call. Spoke with patient regarding BP  Recent Office Vitals: BP Readings from Last 3 Encounters:  02/28/20 118/74  01/06/20 138/80  12/02/19 126/74   Pulse Readings from Last 3 Encounters:  02/28/20 64  01/06/20 66  12/02/19 66    Wt Readings from Last 3 Encounters:  02/28/20 121 lb (54.9 kg)  01/06/20 121 lb 12.8 oz (55.2 kg)  12/02/19 119 lb (54 kg)     Kidney Function Lab Results  Component Value Date/Time   CREATININE 1.33 (H) 12/02/2019 11:07 AM   CREATININE 1.35 (H) 08/16/2019 12:38 PM   GFRNONAA 35 (L) 12/02/2019 11:07 AM   GFRAA 41 (L) 12/02/2019 11:07 AM    BMP Latest Ref Rng & Units 12/02/2019 08/16/2019 05/19/2019  Glucose 65 - 99 mg/dL 82 102(H) 101(H)  BUN 8 - 27 mg/dL 28(H) 29(H) 26  Creatinine 0.57 - 1.00 mg/dL 1.33(H) 1.35(H) 1.44(H)  BUN/Creat Ratio 12 - 28 21 21 18   Sodium 134 - 144 mmol/L 142 139 139  Potassium 3.5 - 5.2 mmol/L 4.6 4.4 4.6  Chloride 96 - 106 mmol/L 105 101 102  CO2 20 - 29 mmol/L 22 22 22   Calcium 8.7 - 10.3 mg/dL 9.5 9.4 9.4     Current antihypertensive regimen:  ? Amlodipine 2.5 mg tablet daily   How often are you checking your Blood Pressure? Patient reports she checks every other day.    Current home BP readings: 135/79. Patient reports her blood pressure fluctuates.    What recent interventions/DTPs have been made by any provider to improve Blood Pressure  control since last CPP Visit:  - Recent interventions was to check BP once a day, document, and provide at future appointments, Ensure daily salt intake < 2300 mg/day. - Patient reports she is taking medications as directed.   Any recent hospitalizations or ED visits since last visit with CPP? No    What diet changes have been made to improve Blood Pressure Control?  o Patient reports she had a boost drink this morning. Patient typically eats egg whites, drink a cup of tea, drinks plenty of water, eats cereal during the day.   What exercise is being done to improve your Blood Pressure Control?  o Patient reports she walks in the park with daughter, and goes to the [facility].  Adherence Review: Is the patient currently on ACE/ARB medication? No Does the patient have >5 day gap between last estimated fill dates? Yes    Goals Addressed            This [place]   Not on track    [place] (see longitudinal plan of care for additional care plan information)  Current Barriers:   Chronic Disease Management support, education, and care coordination needs related to Hypertension, Hyperlipidemia, Depression, and Osteoporosis   Hypertension BP Readings from Last 3 Encounters:  01/06/20 138/80  12/02/19 126/74  12/02/19 126/74    Pharmacist Clinical Goal(s): o Over the next 90 days, patient will work with PharmD and providers to maintain BP goal <140/90  Current regimen:  o Amlodipine 2.5 mg tablet daily  Interventions: o Patient will take medication at same time each day o Will help patient determine which BP monitor is covered through [facility]  catalog with a smaller cuff   Patient self care activities - Over the next 90 days, patient will: o Check BP once a day, document, and provide at future appointments o Ensure daily salt intake < 2300 mg/day  Hyperlipidemia Lab Results  Component Value Date/Time   LDLCALC 103 (H) 11/05/2018 10:59 AM     Pharmacist Clinical Goal(s): o Over the next 90 days, patient will work with PharmD and providers to maintain LDL goal < 100  Current regimen:  o Not taking any medication   Interventions: o Increasing the amount of exercise that she does by walking with daughter 3 or more times per week o Decreasing the amount of fried fatty food the patient eats   Patient self care activities - Over the next 90 days, patient will: o Walk with her daughter at least three times per week   Anxiety  Pharmacist Clinical Goal(s): o Over the next 90 days, patient will work with PharmD and providers to maintain energy to do things   Current regimen:  o Sertraline 25 mg tablet daily  Interventions: o The importance of medication adherence everyday   Patient self care activities - Over the next 90 days, patient will: o Take medication everyday Osteoperosis  Pharmacist Clinical Goal(s) o Over the next 90 days, patient will work with PharmD and providers to take medication everyday  Current regimen:  o Calcium 500 + D- taking 1 tablet every other day  o Cholecalciferol- Taking 1 tablet by mouth at bedtime.  Interventions: o Consider intake of 1200mg  of calcium daily through diet and/or supplementation o DEXA scan o Consider intake of [phone] units of vitamin D through supplementation   Patient self care activities - Over the next 90 days, patient will: o Continue to take her medication every day  o Have a DEXA scan  Medication management  Pharmacist Clinical Goal(s): o Over the next 90 days, patient will work with PharmD and providers to maintain optimal medication adherence  Current pharmacy: [facility]  Interventions o Comprehensive medication review performed. o Continue current medication management strategy  Patient self care activities - Over the next 90 days, patient will: o Focus on medication adherence by using pill packs and keeping a routine o Take medications as  prescribed o Report any questions or concerns to PharmD and/or provider(s)  Initial goal documentation       02/29/20-Unsuccessful telephone outreach to the patient regarding her blood pressure and hyperlipidemia monitoring. Left a HIPAA compliant voicemail.  Follow-Up:  Pharmacist Review-Spoke with  the patient and patient's daughter. Patient is not checking blood pressure once daily but is checking every other day. Patient voiced everything is fine and she has no new issues or concerns regarding her blood pressure monitoring.  [name], CPP Notified.  Raynelle Highland, [place] Pharmacist Assistant [phone]

## 2020-03-01 ENCOUNTER — Telehealth: Payer: Medicare Other

## 2020-03-01 LAB — CMP14+EGFR
ALT: 9 IU/L (ref 0–32)
AST: 23 IU/L (ref 0–40)
Albumin/Globulin Ratio: 1.4 (ref 1.2–2.2)
Albumin: 4.5 g/dL (ref 3.6–4.6)
Alkaline Phosphatase: 75 IU/L (ref 44–121)
BUN/Creatinine Ratio: 21 (ref 12–28)
BUN: 28 mg/dL — ABNORMAL HIGH (ref 8–27)
Bilirubin Total: <0.2 mg/dL (ref 0.0–1.2)
CO2: 25 mmol/L (ref 20–29)
Calcium: 9.7 mg/dL (ref 8.7–10.3)
Chloride: 102 mmol/L (ref 96–106)
Creatinine, Ser: 1.35 mg/dL — ABNORMAL HIGH (ref 0.57–1.00)
GFR calc Af Amer: 40 mL/min/{1.73_m2} — ABNORMAL LOW (ref >59)
GFR calc non Af Amer: 35 mL/min/{1.73_m2} — ABNORMAL LOW (ref >59)
Globulin, Total: 3.3 g/dL (ref 1.5–4.5)
Glucose: 83 mg/dL (ref 65–99)
Potassium: 4.6 mmol/L (ref 3.5–5.2)
Sodium: 139 mmol/L (ref 134–144)
Total Protein: 7.8 g/dL (ref 6.0–8.5)

## 2020-03-01 LAB — PHOSPHORUS: Phosphorus: 3.5 mg/dL (ref 3.0–4.3)

## 2020-03-01 LAB — PROTEIN ELECTROPHORESIS, SERUM
A/G Ratio: 1 (ref 0.7–1.7)
Albumin ELP: 3.9 g/dL (ref 2.9–4.4)
Alpha 1: 0.3 g/dL (ref 0.0–0.4)
Alpha 2: 0.9 g/dL (ref 0.4–1.0)
Beta: 1.2 g/dL (ref 0.7–1.3)
Gamma Globulin: 1.5 g/dL (ref 0.4–1.8)
Globulin, Total: 3.9 g/dL (ref 2.2–3.9)

## 2020-03-01 LAB — PTH, INTACT AND CALCIUM: PTH: 21 pg/mL (ref 15–65)

## 2020-03-01 LAB — LIPID PANEL
Chol/HDL Ratio: 2.5 ratio (ref 0.0–4.4)
Cholesterol, Total: 238 mg/dL — ABNORMAL HIGH (ref 100–199)
HDL: 94 mg/dL (ref >39)
LDL Chol Calc (NIH): 131 mg/dL — ABNORMAL HIGH (ref 0–99)
Triglycerides: 77 mg/dL (ref 0–149)
VLDL Cholesterol Cal: 13 mg/dL (ref 5–40)

## 2020-03-01 LAB — URINE CULTURE

## 2020-03-01 LAB — VITAMIN D 25 HYDROXY (VIT D DEFICIENCY, FRACTURES): Vit D, 25-Hydroxy: 58.9 ng/mL (ref 30.0–100.0)

## 2020-03-02 ENCOUNTER — Other Ambulatory Visit: Payer: Self-pay

## 2020-03-02 DIAGNOSIS — N390 Urinary tract infection, site not specified: Secondary | ICD-10-CM

## 2020-03-02 MED ORDER — CEPHALEXIN 500 MG PO CAPS: 500.0000 mg | ORAL_CAPSULE | Freq: Two times a day (BID) | ORAL | 0 refills | Status: DC

## 2020-03-14 ENCOUNTER — Ambulatory Visit (INDEPENDENT_AMBULATORY_CARE_PROVIDER_SITE_OTHER): Payer: Medicare Other

## 2020-03-14 DIAGNOSIS — N183 Chronic kidney disease, stage 3 unspecified: Secondary | ICD-10-CM | POA: Diagnosis not present

## 2020-03-14 DIAGNOSIS — I10 Essential (primary) hypertension: Secondary | ICD-10-CM | POA: Diagnosis not present

## 2020-03-14 NOTE — Chronic Care Management (AMB) (Signed)
Chronic Care Management    Social Work Note  03/14/2020 Name: Whitney Henderson MRN: [id number] DOB: 1928-09-14  Whitney Henderson is a 87 y.o. year old female who is a primary care patient of Whitney Chard, MD. The CCM team was consulted to assist the patient with chronic disease management and/or care coordination needs related to: [employer] .   Engaged with patient by telephone for follow up visit in response to provider referral for social work chronic care management and care coordination services.   Consent to Services:  The patient was given information about Chronic Care Management services, agreed to services, and gave verbal consent prior to initiation of services.  Please see initial visit note for detailed documentation.   Patient agreed to services and consent obtained.   Assessment: Review of patient past medical history, allergies, medications, and health status, including review of relevant consultants reports was performed today as part of a comprehensive evaluation and provision of chronic care management and care coordination services.     SDOH (Social Determinants of Health) assessments and interventions performed:  SDOH Interventions   Flowsheet Row Most Recent Value  SDOH Interventions   Food Insecurity Interventions Intervention Not Indicated  Housing Interventions Intervention Not Indicated  Transportation Interventions Intervention Not Indicated       Advanced Directives Status: Not addressed in this encounter.  CCM Care Plan  Allergies  Allergen Reactions  . Codeine Rash    Outpatient Encounter Medications as of 03/14/2020  Medication Sig  . Alpha-D-Galactosidase (BEANO MELTAWAYS) TABS Take 1 tablet by mouth daily as needed (gas).  [name] amLODipine (NORVASC) 2.5 MG tablet TAKE 1 TABLET(2.5 MG) BY MOUTH TWICE DAILY  . Apoaequorin (PREVAGEN PO) Take by mouth daily at 6 (six) AM.  . ASPIRIN 81 PO Take 81 mg by mouth as needed (for pain or headache).    . B Complex Vitamins (B COMPLEX PO) Take by mouth.  . betamethasone dipropionate (DIPROLENE) 0.05 % cream APPLY THIN LAYER EXTERNALLY TO THE AFFECTED AREA EVERY DAY AS NEEDED  . Calcium Carb-Cholecalciferol (CALCIUM 500 +D PO) Take by mouth. With 400 mg of vitd Doesn't take every day, only sometimes  . cholecalciferol (VITAMIN D) 1000 units tablet Take 1,000 Units by mouth daily.   [name] donepezil (ARICEPT) 5 MG tablet TAKE 1 TABLET(5 MG) BY MOUTH AT BEDTIME  . Ginkgo Biloba 120 MG CAPS Take 1 capsule by mouth daily at 12 noon.   . Multiple Vitamins-Minerals (CENTRUM SILVER PO) Take by mouth.  . Multiple Vitamins-Minerals (PRESERVISION AREDS 2 PO) Take 1 tablet by mouth daily.   [name] Propylene Glycol (SYSTANE BALANCE OP) Place 1 drop into both eyes daily as needed (for itchy eyes). Uses when needed.  . sertraline (ZOLOFT) 25 MG tablet TAKE 1 TABLET(25 MG) BY MOUTH DAILY  . Theanine 100 MG CAPS Take 1 capsule by mouth daily at 12 noon.  . vitamin C (ASCORBIC ACID) 500 MG tablet Take 500 mg by mouth daily.   No facility-administered encounter medications on file as of 03/14/2020.    Patient Active Problem List   Diagnosis Date Noted  . Tremor 01/06/2020  . Age-related osteoporosis with current pathological fracture 11/05/2018  . Grief reaction 03/31/2018  . Hypertensive nephropathy 03/03/2018  . Chronic renal disease, stage III ([place]) 12/11/2017  . Hypertension, essential, benign 10/30/2017  . Anxiety 10/11/2017  . Memory disorder 09/04/2016  . Headache 04/27/2015  . Hypertension 01/16/2013  . UTI (lower urinary tract infection) 01/16/2013    Conditions  to be addressed/monitored: HTN and CKD Stage III; Care Coordination  Care Plan : Social Work Care Plan  Updates made by Whitney Henderson since 03/14/2020 12:00 AM    Problem: Care Coordination   Priority: Low  Onset Date: 12/27/2019    Long-Range Goal: Collaborate with RN Care Manager to perform appropriate assessments to assist with care  coordination needs   Start Date: 12/27/2019  Expected End Date: 03/21/2020  This Visit's Progress: On track  Recent Progress: On track  Priority: Low  Note:   Current Barriers:  . Chronic disease management support and education needs related to HTN, CKD Stage III, and Hypertensive Nephropathy   Social Worker Clinical Goal(s):  [name] Over the next 120 days, patient will work with SW to identify and address any acute and/or chronic care coordination needs related to the self health management of HTN, CKD Stage III, and Hypertensive Nephropathy   CCM SW Interventions:  . Inter-disciplinary care team collaboration (see longitudinal plan of care) . Successful outbound call placed to the patient to assist with care coordination needs . Discussed the patient continues to do well in the home with support from her daughter, Whitney Henderson . Performed chart review to note recent PCP visit completed with new prescription for Keflex ordered to treat UTI ; patient no longer having complaints or symptoms since obtaining medication . Performed SDoH screening - no acute challenges noted at this time . Discussed long term follow up with SW while patient remains actively involved with  RN Case Manager  to address care management needs . Scheduled follow up call over the next 90 days Patient Goals/Self-Care Activities . Over the next 90 days, patient will:   - Patient will self administer medications as prescribed Patient will attend all scheduled provider appointments Patient will call pharmacy for medication refills Patient will call provider office for new concerns or questions Contact SW as needed prior to next scheduled call Follow Up Plan: The patient has been provided with contact information for the care management team and has been advised to call with any health related questions or concerns.  The care management team will reach out to the patient again over the next 90 days.        Follow Up Plan: SW will  follow up with patient by phone over the next 90 days.      Whitney Henderson, BSW, CDP Social Worker, Certified Dementia Practitioner [place] [facility] [place] Management [phone]  Total time spent performing care coordination and/or care management activities with the patient by phone or face to face = 21 minutes.

## 2020-03-14 NOTE — Patient Instructions (Signed)
Goals we discussed today:  Goals Addressed            This Visit's Progress   . Work with SW to manage care coordination needs   On track    Timeframe:  Long-Range Goal Priority:  Low Start Date:  12.6.21                           Expected End Date: 3.1.22  Next expected date of contact: 2.22.22                      Patient Goals/Self-Care Activities . Over the next 90 days, patient will:   - Patient will self administer medications as prescribed Patient will attend all scheduled provider appointments Patient will call pharmacy for medication refills Patient will call provider office for new concerns or questions Contact SW as needed prior to next scheduled call

## 2020-03-16 DIAGNOSIS — H35033 Hypertensive retinopathy, bilateral: Secondary | ICD-10-CM | POA: Diagnosis not present

## 2020-03-16 DIAGNOSIS — H353131 Nonexudative age-related macular degeneration, bilateral, early dry stage: Secondary | ICD-10-CM | POA: Diagnosis not present

## 2020-03-16 DIAGNOSIS — H43811 Vitreous degeneration, right eye: Secondary | ICD-10-CM | POA: Diagnosis not present

## 2020-03-16 DIAGNOSIS — H40013 Open angle with borderline findings, low risk, bilateral: Secondary | ICD-10-CM | POA: Diagnosis not present

## 2020-03-28 ENCOUNTER — Other Ambulatory Visit: Payer: Self-pay | Admitting: Internal Medicine

## 2020-03-28 DIAGNOSIS — I10 Essential (primary) hypertension: Secondary | ICD-10-CM

## 2020-04-04 ENCOUNTER — Telehealth: Payer: Medicare Other

## 2020-04-10 ENCOUNTER — Telehealth: Payer: Self-pay

## 2020-04-10 ENCOUNTER — Telehealth: Payer: Medicare Other

## 2020-04-10 NOTE — Telephone Encounter (Signed)
  Chronic Care Management   Outreach Note  04/10/2020 Name: [name] MRN: [id number] DOB: 12/26/1928  Referred by: [name], MD Reason for referral : Chronic Care Management (RN CM FU Call Attempt )   An unsuccessful telephone outreach was attempted today. The patient was referred to the case management team for assistance with care management and care coordination.   Follow Up Plan: A HIPAA compliant phone message was left for the patient providing contact information and requesting a return call.  The care management team will reach out to the patient again over the next 30-45 days.   Barb Merino, RN, BSN, CCM Care Management Coordinator [place] [facility]  Direct Phone: [phone]

## 2020-04-12 ENCOUNTER — Encounter: Payer: Self-pay | Admitting: Internal Medicine

## 2020-04-15 ENCOUNTER — Other Ambulatory Visit: Payer: Self-pay | Admitting: Internal Medicine

## 2020-04-25 ENCOUNTER — Emergency Department (HOSPITAL_COMMUNITY): Payer: Medicare Other

## 2020-04-25 ENCOUNTER — Other Ambulatory Visit: Payer: Self-pay

## 2020-04-25 ENCOUNTER — Telehealth: Payer: Self-pay

## 2020-04-25 ENCOUNTER — Emergency Department (HOSPITAL_COMMUNITY)
Admission: EM | Admit: 2020-04-25 | Discharge: 2020-04-25 | Disposition: A | Discharge status: Home / Self Care | Payer: Medicare Other | Attending: Emergency Medicine | Admitting: Emergency Medicine

## 2020-04-25 ENCOUNTER — Encounter (HOSPITAL_COMMUNITY): Payer: Self-pay | Admitting: Pharmacy Technician

## 2020-04-25 DIAGNOSIS — I129 Hypertensive chronic kidney disease with stage 1 through stage 4 chronic kidney disease, or unspecified chronic kidney disease: Secondary | ICD-10-CM | POA: Insufficient documentation

## 2020-04-25 DIAGNOSIS — R001 Bradycardia, unspecified: Secondary | ICD-10-CM | POA: Insufficient documentation

## 2020-04-25 DIAGNOSIS — R519 Headache, unspecified: Secondary | ICD-10-CM | POA: Insufficient documentation

## 2020-04-25 DIAGNOSIS — Z7982 Long term (current) use of aspirin: Secondary | ICD-10-CM | POA: Insufficient documentation

## 2020-04-25 DIAGNOSIS — Z79899 Other long term (current) drug therapy: Secondary | ICD-10-CM | POA: Insufficient documentation

## 2020-04-25 DIAGNOSIS — Z87891 Personal history of nicotine dependence: Secondary | ICD-10-CM | POA: Diagnosis not present

## 2020-04-25 DIAGNOSIS — R0602 Shortness of breath: Secondary | ICD-10-CM | POA: Diagnosis not present

## 2020-04-25 DIAGNOSIS — N183 Chronic kidney disease, stage 3 unspecified: Secondary | ICD-10-CM | POA: Insufficient documentation

## 2020-04-25 DIAGNOSIS — R0789 Other chest pain: Secondary | ICD-10-CM | POA: Diagnosis not present

## 2020-04-25 DIAGNOSIS — R079 Chest pain, unspecified: Secondary | ICD-10-CM | POA: Diagnosis not present

## 2020-04-25 LAB — URINALYSIS, ROUTINE W REFLEX MICROSCOPIC
Bilirubin Urine: NEGATIVE
Glucose, UA: NEGATIVE mg/dL
Hgb urine dipstick: NEGATIVE
Ketones, ur: NEGATIVE mg/dL
Nitrite: NEGATIVE
Protein, ur: NEGATIVE mg/dL
Specific Gravity, Urine: 1.008 (ref 1.005–1.030)
pH: 6 (ref 5.0–8.0)

## 2020-04-25 LAB — BASIC METABOLIC PANEL
Anion gap: 5 (ref 5–15)
BUN: 21 mg/dL (ref 8–23)
CO2: 27 mmol/L (ref 22–32)
Calcium: 8.9 mg/dL (ref 8.9–10.3)
Chloride: 105 mmol/L (ref 98–111)
Creatinine, Ser: 1.41 mg/dL — ABNORMAL HIGH (ref 0.44–1.00)
GFR, Estimated: 35 mL/min — ABNORMAL LOW (ref >60)
Glucose, Bld: 97 mg/dL (ref 70–99)
Potassium: 4.1 mmol/L (ref 3.5–5.1)
Sodium: 137 mmol/L (ref 135–145)

## 2020-04-25 LAB — CBC WITH DIFFERENTIAL/PLATELET
Abs Immature Granulocytes: 0 10*3/uL (ref 0.00–0.07)
Basophils Absolute: 0.1 10*3/uL (ref 0.0–0.1)
Basophils Relative: 1 %
Eosinophils Absolute: 0.1 10*3/uL (ref 0.0–0.5)
Eosinophils Relative: 2 %
HCT: 34.9 % — ABNORMAL LOW (ref 36.0–46.0)
Hemoglobin: 11.2 g/dL — ABNORMAL LOW (ref 12.0–15.0)
Immature Granulocytes: 0 %
Lymphocytes Relative: 39 %
Lymphs Abs: 1.5 10*3/uL (ref 0.7–4.0)
MCH: 28.4 pg (ref 26.0–34.0)
MCHC: 32.1 g/dL (ref 30.0–36.0)
MCV: 88.6 fL (ref 80.0–100.0)
Monocytes Absolute: 0.5 10*3/uL (ref 0.1–1.0)
Monocytes Relative: 12 %
Neutro Abs: 1.8 10*3/uL (ref 1.7–7.7)
Neutrophils Relative %: 46 %
Platelets: 180 10*3/uL (ref 150–400)
RBC: 3.94 MIL/uL (ref 3.87–5.11)
RDW: 14.5 % (ref 11.5–15.5)
WBC: 4 10*3/uL (ref 4.0–10.5)
nRBC: 0 % (ref 0.0–0.2)

## 2020-04-25 LAB — HEPATIC FUNCTION PANEL
ALT: 12 U/L (ref 0–44)
AST: 21 U/L (ref 15–41)
Albumin: 3.4 g/dL — ABNORMAL LOW (ref 3.5–5.0)
Alkaline Phosphatase: 54 U/L (ref 38–126)
Bilirubin, Direct: <0.1 mg/dL (ref 0.0–0.2)
Total Bilirubin: 0.7 mg/dL (ref 0.3–1.2)
Total Protein: 6.8 g/dL (ref 6.5–8.1)

## 2020-04-25 LAB — TROPONIN I (HIGH SENSITIVITY)
Troponin I (High Sensitivity): 6 ng/L (ref <18)
Troponin I (High Sensitivity): 6 ng/L (ref <18)

## 2020-04-25 MED ORDER — SODIUM CHLORIDE 0.9 % IV BOLUS
500.0000 mL | Freq: Once | INTRAVENOUS | Status: AC
  Administered 2020-04-25: 500 mL via INTRAVENOUS

## 2020-04-25 MED ORDER — AMLODIPINE BESYLATE 5 MG PO TABS
2.5000 mg | ORAL_TABLET | Freq: Once | ORAL | Status: AC
  Administered 2020-04-25: 2.5 mg via ORAL
  Filled 2020-04-25: qty 1

## 2020-04-25 NOTE — ED Triage Notes (Signed)
Pt here with reports of frontal head pain "periodically". Pt reports some chest pain but is not able to give a good history. Pt with no facial droop, no unilateral weakness, aphasia or vision issues.

## 2020-04-25 NOTE — ED Notes (Signed)
Patient transported to CT 
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
## 2020-04-25 NOTE — ED Notes (Signed)
Discharge instructions discussed with pt. Pt verbalized understanding with no questions at this time. Pt going home with daughter at this time
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
## 2020-04-25 NOTE — Discharge Instructions (Addendum)
Your laboratory results are within normal limits today.  The CT of your head did not show any acute pathology.  Follow-up with your primary care physician in 1 week for reevaluation of symptoms.

## 2020-04-25 NOTE — Telephone Encounter (Signed)
Spoke to the pt's daughter [name] and notified her that the office got the message that the pt is at the hospital and we will wait for hospital records.
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
## 2020-04-26 LAB — URINE CULTURE: Culture: <10000 — AB

## 2020-05-08 ENCOUNTER — Telehealth: Payer: Self-pay

## 2020-05-08 NOTE — Chronic Care Management (AMB) (Signed)
Called patient to remind her of ccm appointment on 05/09/20 at 1:00PM. Patient requested to change appointment to 05/18/20 at 11:00.Rescheduled patient for 05/18/20 at 11:00.  Lizbeth Bark Clinical Pharmacist Assistant [phone]

## 2020-05-09 ENCOUNTER — Telehealth: Payer: Self-pay

## 2020-05-12 ENCOUNTER — Telehealth: Payer: Medicare Other

## 2020-05-17 ENCOUNTER — Telehealth: Payer: Self-pay

## 2020-05-17 NOTE — Chronic Care Management (AMB) (Addendum)
Patient is aware of phone appointment with [name] on 05-18-2020 @ 11:00am. Informed patient to have medications and logs available.  Whitney Henderson  [phone]

## 2020-05-18 ENCOUNTER — Ambulatory Visit (INDEPENDENT_AMBULATORY_CARE_PROVIDER_SITE_OTHER): Payer: Medicare Other

## 2020-05-18 DIAGNOSIS — I10 Essential (primary) hypertension: Secondary | ICD-10-CM | POA: Diagnosis not present

## 2020-05-18 DIAGNOSIS — F419 Anxiety disorder, unspecified: Secondary | ICD-10-CM

## 2020-05-18 NOTE — Progress Notes (Signed)
Chronic Care Management Pharmacy Note  05/18/2020 Name:  Whitney Henderson MRN:  [id number] DOB:  1928-06-28  Subjective: Whitney Henderson is an 87 y.o. year old female who is a primary patient of [name], MD.  The CCM team was consulted for assistance with disease management and care coordination needs.  Patient reports that she went to the hospital for a headache. Speaking with Whitney Henderson today without her daughter.   Engaged with patient by telephone for follow up visit in response to provider referral for pharmacy case management and/or care coordination services.   Consent to Services:  The patient was given information about Chronic Care Management services, agreed to services, and gave verbal consent prior to initiation of services.  Please see initial visit note for detailed documentation.   Patient Care Team: [name], MD as PCP - General (Internal Medicine) Monna Fam, MD as Consulting Physician (Ophthalmology) Lynne Logan, RN as Case Manager Daneen Schick as Social Worker Mayford Knife, [facility] (Pharmacist)  Recent office visits: 02/29/2020  ED visit: 04/25/2020  Hospital visits: None in previous 6 months  Objective:  Lab Results  Component Value Date   CREATININE 1.41 (H) 04/25/2020   BUN 21 04/25/2020   GFRNONAA 35 (L) 04/25/2020   GFRAA 40 (L) 02/28/2020   NA 137 04/25/2020   K 4.1 04/25/2020   CALCIUM 8.9 04/25/2020   CO2 27 04/25/2020   GLUCOSE 97 04/25/2020    Lab Results  Component Value Date/Time   MICROALBUR 150 02/28/2020 12:49 PM   MICROALBUR 10 02/18/2019 01:02 PM    Last diabetic Eye exam:  Lab Results  Component Value Date/Time   HMDIABEYEEXA Retinopathy (A) 12/15/2017 12:00 AM    Last diabetic Foot exam: No results found for: HMDIABFOOTEX   Lab Results  Component Value Date   CHOL 238 (H) 02/28/2020   HDL 94 02/28/2020   LDLCALC 131 (H) 02/28/2020   TRIG 77 02/28/2020   CHOLHDL 2.5 02/28/2020    Hepatic  Function Latest Ref Rng & Units 04/25/2020 02/28/2020 05/19/2019  Total Protein 6.5 - 8.1 g/dL 6.8 7.8 7.2  Albumin 3.5 - 5.0 g/dL 3.4(L) 4.5 -  AST 15 - 41 U/L 21 23 -  ALT 0 - 44 U/L 12 9 -  Alk Phosphatase 38 - 126 U/L 54 75 -  Total Bilirubin 0.3 - 1.2 mg/dL 0.7 <0.2 -  Bilirubin, Direct 0.0 - 0.2 mg/dL <0.1 - -    Lab Results  Component Value Date/Time   TSH 2.550 12/02/2019 11:07 AM   TSH 2.620 11/05/2018 10:59 AM   FREET4 1.36 11/05/2018 10:59 AM   FREET4 1.35 10/30/2017 12:43 PM    CBC Latest Ref Rng & Units 04/25/2020 12/02/2019 02/18/2019  WBC 4.0 - 10.5 K/uL 4.0 4.4 4.6  Hemoglobin 12.0 - 15.0 g/dL 11.2(L) 11.7 12.4  Hematocrit 36.0 - 46.0 % 34.9(L) 36.6 39.6  Platelets 150 - 400 K/uL 180 193 213    Lab Results  Component Value Date/Time   VD25OH 58.9 02/28/2020 04:48 PM    Clinical ASCVD: No  The ASCVD Risk score Mikey Bussing DC Jr., et al., 2013) failed to calculate for the following reasons:   The 2013 ASCVD risk score is only valid for ages 34 to 19    Depression screen PHQ 2/9 12/02/2019 05/19/2019 12/01/2018  Decreased Interest 1 0 0  Down, Depressed, Hopeless 2 1 1   PHQ - 2 Score 3 1 1   Altered sleeping 0 0 0  Tired,  decreased energy 3 1 1   Change in appetite 0 0 0  Feeling bad or failure about yourself  0 0 0  Trouble concentrating 0 0 0  Moving slowly or fidgety/restless 0 0 0  Suicidal thoughts 0 0 0  PHQ-9 Score 6 2 2   Difficult doing work/chores Not difficult at all Not difficult at all Not difficult at all     Social History   Tobacco Use  Smoking Status Former Smoker  . Years: 2.00  . Types: Cigarettes  Smokeless Tobacco Never Used  Tobacco Comment   smoked sometimes not daily   BP Readings from Last 3 Encounters:  04/25/20 (!) 199/74  02/28/20 118/74  01/06/20 138/80   Pulse Readings from Last 3 Encounters:  04/25/20 (!) 50  02/28/20 64  01/06/20 66   Wt Readings from Last 3 Encounters:  02/28/20 121 lb (54.9 kg)  01/06/20 121 lb 12.8  oz (55.2 kg)  12/02/19 119 lb (54 kg)   BMI Readings from Last 3 Encounters:  02/28/20 23.95 kg/m  01/06/20 23.01 kg/m  12/02/19 22.48 kg/m    Assessment/Interventions: Review of patient past medical history, allergies, medications, health status, including review of consultants reports, laboratory and other test data, was performed as part of comprehensive evaluation and provision of chronic care management services.   SDOH:  (Social Determinants of Health) assessments and interventions performed: No  SDOH Screenings   Alcohol Screen: Not on file  Depression (PHQ2-9): Medium Risk  . PHQ-2 Score: 6  Financial Resource Strain: Low Risk   . Difficulty of Paying Living Expenses: Not hard at all  Food Insecurity: No Food Insecurity  . Worried About Charity fundraiser in the Last Year: Never true  . Ran Out of Food in the Last Year: Never true  Housing: Low Risk   . Last Housing Risk Score: 0  Physical Activity: Sufficiently Active  . Days of Exercise per Week: 3 days  . Minutes of Exercise per Session: 60 min  Social Connections: Not on file  Stress: No Stress Concern Present  . Feeling of Stress : Only a little  Tobacco Use: Medium Risk  . Smoking Tobacco Use: Former Smoker  . Smokeless Tobacco Use: Never Used  Transportation Needs: No Transportation Needs  . Lack of Transportation (Medical): No  . Lack of Transportation (Non-Medical): No    CCM Care Plan  Allergies  Allergen Reactions  . Codeine Rash    Medications Reviewed Today    Reviewed by Mayford Knife, RPH (Pharmacist) on 05/18/20 at 1152  Med List Status: <None>  Medication Order Taking? Sig Documenting Provider Last Dose Status Informant  acetaminophen (TYLENOL) 500 MG tablet [id number]  Take 500 mg by mouth every 6 (six) hours as needed for mild pain or moderate pain. [provider]  Active Child  amLODipine (NORVASC) 2.5 MG tablet [id number] Yes TAKE 1 TABLET(2.5 MG) BY MOUTH TWICE DAILY   Patient taking differently: Take 2.5 mg by mouth 2 (two) times daily.   [name], MD Taking Active   Apoaequorin [facility] PO) [id number] Yes Take 1 capsule by mouth daily at 6 (six) AM. [provider] Taking Active Child  ASPIRIN 81 PO [id number] Yes Take 81 mg by mouth as needed (for pain or headache).  [provider] Taking Active Child           Med Note ([place]   Sun Feb 22, 2018  2:53 PM)    B Complex Vitamins (B  COMPLEX PO) [id number] Yes Take 1 capsule by mouth every other day. [provider] Taking Active Child  betamethasone dipropionate (DIPROLENE) 0.05 % cream [id number] No APPLY THIN LAYER EXTERNALLY TO THE AFFECTED AREA EVERY DAY AS NEEDED  Patient not taking: Reported on 05/18/2020   Minette Brine, FNP Not Taking Active   Calcium Carb-Cholecalciferol (CALCIUM 500 +D PO) [id number] Yes Take 1 tablet by mouth every other day. [provider] Taking Active Child  cholecalciferol (VITAMIN D) 1000 units tablet [id number] Yes Take 1,000 Units by mouth daily.  [provider] Taking Active Child           Med Note ([place]   Sun Feb 22, 2018  2:48 PM)    donepezil (ARICEPT) 5 MG tablet [id number] Yes TAKE 1 TABLET(5 MG) BY MOUTH AT BEDTIME  Patient taking differently: Take 5 mg by mouth at bedtime.   Suzzanne Cloud, NP Taking Active   Multiple Vitamins-Minerals (CENTRUM SILVER PO) [id number] Yes Take 1 tablet by mouth every other day. [provider] Taking Active Child  Multiple Vitamins-Minerals (PRESERVISION AREDS 2 PO) [id number] Yes Take 1 tablet by mouth 2 (two) times daily. [provider] Taking Active Child           Med Note Alfonse Spruce, ANH T   Tue Apr 25, 2020  5:27 PM) Confirmed taking BID and Centrum  Propylene Glycol (SYSTANE BALANCE OP) [id number] Yes Place 1 drop into both eyes daily as needed (for itchy eyes). [provider] Taking Active Child  sertraline (ZOLOFT) 25 MG tablet  [id number] Yes TAKE 1 TABLET(25 MG) BY MOUTH DAILY  Patient taking differently: Take 25 mg by mouth daily.   [name], MD Taking Active   Theanine 100 MG CAPS [id number] Yes Take 1 capsule by mouth daily as needed (sleep). [provider] Taking Active Child  vitamin C (ASCORBIC ACID) 500 MG tablet [id number] Yes Take 500 mg by mouth every other day. [provider] Taking Active Child          Patient Active Problem List   Diagnosis Date Noted  . Tremor 01/06/2020  . Age-related osteoporosis with current pathological fracture 11/05/2018  . Grief reaction 03/31/2018  . Hypertensive nephropathy 03/03/2018  . Chronic renal disease, stage III ([place]) 12/11/2017  . Hypertension, essential, benign 10/30/2017  . Anxiety 10/11/2017  . Memory disorder 09/04/2016  . Headache 04/27/2015  . Hypertension 01/16/2013  . UTI (lower urinary tract infection) 01/16/2013    Immunization History  Administered Date(s) Administered  . Fluad Quad(high Dose 65+) 09/25/2018  . Influenza, High Dose Seasonal PF 10/02/2016, 10/14/2019  . Influenza-Unspecified 10/22/2017  . PFIZER(Purple Top)SARS-COV-2 Vaccination 02/09/2019, 03/01/2019, 12/09/2019  . Pneumococcal Conjugate-13 11/28/2015  . Pneumococcal Polysaccharide-23 12/04/2016  . Zoster Recombinat (Shingrix) 09/25/2018, 12/04/2018    Conditions to be addressed/monitored:  Hypertension and Anxiety  Care Plan : [place]  Updates made by Mayford Knife, RPH since 05/18/2020 12:00 AM    Problem: HTN, Anxiety   Priority: High    Long-Range Goal: Disease Management   This Visit's Progress: Not on track  Priority: High  Note:   Current Barriers:  . Unable to independently monitor therapeutic efficacy  Pharmacist Clinical Goal(s):  [name] Patient will achieve adherence to monitoring guidelines and medication adherence to achieve therapeutic efficacy through collaboration with PharmD and provider.    Interventions: . 1:1 collaboration with [name], MD regarding development and update of comprehensive plan of care as evidenced by  provider attestation and co-signature . Inter-disciplinary care team collaboration (see longitudinal plan of care) . Comprehensive medication review performed; medication list updated in electronic medical record  Hypertension (BP goal <140/90) -Controlled -Current treatment: . Amlodipine 2.5 mg tablet twice per day. -Current home readings: patient is checking her BP here and there but not on a schedule -Current exercise habits: she is exercising at [facility] with her daughter, and she also walks at the [facility].  -Will schedule an appointment with patient when her daughter is available as well to discuss her BP and medication management -Denies hypotensive/hypertensive symptoms -Educated on BP goals and benefits of medications for prevention of heart attack, stroke and kidney damage; Daily salt intake goal < 2300 mg; Importance of home blood pressure monitoring; Proper BP monitoring technique; Symptoms of hypotension and importance of maintaining adequate hydration; -Counseled to monitor BP at home once per day, document, and provide log at future appointments -Recommended to continue current medication Educated on the importance of patient taking BP at least once per day.    Anxiety (Goal: limit anxiety symptoms) -Controlled -Current treatment: . Sertraline 25 mg taking 1 tablet by mouth daily  -GAD7: did not want to do the assessment questions today. -Educated on Benefits of medication for symptom control -Recommended to continue current medication   Patient Goals/Self-Care Activities . Patient will:  - take medications as prescribed  Follow Up Plan: The patient has been provided with contact information for the care management team and has been advised to call with any health related questions or concerns.         Medication  Assistance: None required.  Patient affirms current coverage meets needs.  Patient's preferred pharmacy is:  [facility] [phone] - [place], [address] [place] [address] Phone: [phone] Fax: [phone]  Uses pill box? No - she keeps her medication in the bottles Pt endorses 80% compliance  We discussed: Benefits of medication synchronization, packaging and delivery as well as enhanced pharmacist oversight with [employer]. Patient decided to: Continue current medication management strategy  Care Plan and Follow Up Patient Decision:  Patient agrees to Care Plan and Follow-up.  Plan: The patient has been provided with contact information for the care management team and has been advised to call with any health related questions or concerns.   [name], PharmD Clinical Pharmacist [facility] [phone]

## 2020-05-18 NOTE — Patient Instructions (Signed)
Visit Information It was great speaking with you today!  Please let me know if you have any questions about our visit.  Goals Addressed            This Visit's Progress   . Manage My Medicine       Timeframe:  Long-Range Goal Priority:  High Start Date:                             Expected End Date:                       Follow Up Date 06/07/2020   - call for medicine refill 2 or 3 days before it runs out - keep a list of all the medicines I take; vitamins and herbals too - use a pillbox to sort medicine - use an alarm clock or phone to remind me to take my medicine    Why is this important?   . These steps will help you keep on track with your medicines.        Patient Care Plan: CCM Pharmacy Care Plan    Problem Identified: HTN, Anxiety   Priority: High    Long-Range Goal: Disease Management   This Visit's Progress: Not on track  Priority: High  Note:   Current Barriers:  . Unable to independently monitor therapeutic efficacy  Pharmacist Clinical Goal(s):  [name] Patient will achieve adherence to monitoring guidelines and medication adherence to achieve therapeutic efficacy through collaboration with PharmD and provider.   Interventions: . 1:1 collaboration with [name], MD regarding development and update of comprehensive plan of care as evidenced by provider attestation and co-signature . Inter-disciplinary care team collaboration (see longitudinal plan of care) . Comprehensive medication review performed; medication list updated in electronic medical record  Hypertension (BP goal <140/90) -Controlled -Current treatment: . Amlodipine 2.5 mg tablet twice per day. -Current home readings: patient is checking her BP here and there but not on a schedule -Current exercise habits: she is exercising at [facility] with her daughter, and she also walks at the [facility].  -Will schedule an appointment with patient when her daughter is available as well to discuss her BP  and medication management -Denies hypotensive/hypertensive symptoms -Educated on BP goals and benefits of medications for prevention of heart attack, stroke and kidney damage; Daily salt intake goal < 2300 mg; Importance of home blood pressure monitoring; Proper BP monitoring technique; Symptoms of hypotension and importance of maintaining adequate hydration; -Counseled to monitor BP at home once per day, document, and provide log at future appointments -Recommended to continue current medication Educated on the importance of patient taking BP at least once per day.    Anxiety (Goal: limit anxiety symptoms) -Controlled -Current treatment: . Sertraline 25 mg taking 1 tablet by mouth daily  -GAD7: did not want to do the assessment questions today. -Educated on Benefits of medication for symptom control -Recommended to continue current medication   Patient Goals/Self-Care Activities . Patient will:  - take medications as prescribed  Follow Up Plan: The patient has been provided with contact information for the care management team and has been advised to call with any health related questions or concerns.          Patient agreed to services and verbal consent obtained.   The patient verbalized understanding of instructions, educational materials, and care plan provided today and agreed to receive a mailed copy  of patient instructions, educational materials, and care plan.   [name], PharmD Clinical Pharmacist [facility] [phone]

## 2020-06-06 ENCOUNTER — Telehealth: Payer: Self-pay

## 2020-06-06 NOTE — Chronic Care Management (AMB) (Signed)
Called patient for an appointment reminder with [name], [place] on 06-07-2020 at 3:30. Instructed patient to have all meds/supplements and logs available for review. Patient voiced understanding.   [place]  [phone]

## 2020-06-07 ENCOUNTER — Telehealth: Payer: Self-pay

## 2020-06-07 ENCOUNTER — Telehealth: Payer: Medicare Other

## 2020-06-07 NOTE — Progress Notes (Deleted)
Chronic Care Management Pharmacy Note  06/07/2020 Name:  Whitney Henderson MRN:  [id number] DOB:  06-Jul-1928  Subjective: Whitney Henderson is an 87 y.o. year old female who is a primary patient of [name], MD.  The CCM team was consulted for assistance with disease management and care coordination needs.    {CCMTELEPHONEFACETOFACE:21091510} for {CCMINITIALFOLLOWUPCHOICE:21091511} in response to provider referral for pharmacy case management and/or care coordination services.   Consent to Services:  {CCMCONSENTOPTIONS:25074}  Patient Care Team: [name], MD as PCP - General (Internal Medicine) Monna Fam, MD as Consulting Physician (Ophthalmology) Lynne Logan, RN as Case Manager Daneen Schick as Social Worker Mayford Knife, [facility] (Pharmacist)  Recent office visits: ***  Recent consult visits: [facility] visits: {Hospital DC Yes/No:25215}  Objective:  Lab Results  Component Value Date   CREATININE 1.41 (H) 04/25/2020   BUN 21 04/25/2020   GFRNONAA 35 (L) 04/25/2020   GFRAA 40 (L) 02/28/2020   NA 137 04/25/2020   K 4.1 04/25/2020   CALCIUM 8.9 04/25/2020   CO2 27 04/25/2020   GLUCOSE 97 04/25/2020    Lab Results  Component Value Date/Time   MICROALBUR 150 02/28/2020 12:49 PM   MICROALBUR 10 02/18/2019 01:02 PM    Last diabetic Eye exam:  Lab Results  Component Value Date/Time   HMDIABEYEEXA Retinopathy (A) 12/15/2017 12:00 AM    Last diabetic Foot exam: No results found for: HMDIABFOOTEX   Lab Results  Component Value Date   CHOL 238 (H) 02/28/2020   HDL 94 02/28/2020   LDLCALC 131 (H) 02/28/2020   TRIG 77 02/28/2020   CHOLHDL 2.5 02/28/2020    Hepatic Function Latest Ref Rng & Units 04/25/2020 02/28/2020 05/19/2019  Total Protein 6.5 - 8.1 g/dL 6.8 7.8 7.2  Albumin 3.5 - 5.0 g/dL 3.4(L) 4.5 -  AST 15 - 41 U/L 21 23 -  ALT 0 - 44 U/L 12 9 -  Alk Phosphatase 38 - 126 U/L 54 75 -  Total Bilirubin 0.3 - 1.2 mg/dL 0.7 <0.2 -   Bilirubin, Direct 0.0 - 0.2 mg/dL <0.1 - -    Lab Results  Component Value Date/Time   TSH 2.550 12/02/2019 11:07 AM   TSH 2.620 11/05/2018 10:59 AM   FREET4 1.36 11/05/2018 10:59 AM   FREET4 1.35 10/30/2017 12:43 PM    CBC Latest Ref Rng & Units 04/25/2020 12/02/2019 02/18/2019  WBC 4.0 - 10.5 K/uL 4.0 4.4 4.6  Hemoglobin 12.0 - 15.0 g/dL 11.2(L) 11.7 12.4  Hematocrit 36.0 - 46.0 % 34.9(L) 36.6 39.6  Platelets 150 - 400 K/uL 180 193 213    Lab Results  Component Value Date/Time   VD25OH 58.9 02/28/2020 04:48 PM    Clinical ASCVD: {YES/NO:21197} The ASCVD Risk score Mikey Bussing DC Jr., et al., 2013) failed to calculate for the following reasons:   The 2013 ASCVD risk score is only valid for ages 99 to 62    Depression screen PHQ 2/9 12/02/2019 05/19/2019 12/01/2018  Decreased Interest 1 0 0  Down, Depressed, Hopeless 2 1 1   PHQ - 2 Score 3 1 1   Altered sleeping 0 0 0  Tired, decreased energy 3 1 1   Change in appetite 0 0 0  Feeling bad or failure about yourself  0 0 0  Trouble concentrating 0 0 0  Moving slowly or fidgety/restless 0 0 0  Suicidal thoughts 0 0 0  PHQ-9 Score 6 2 2   Difficult doing work/chores Not difficult at all Not  difficult at all Not difficult at all     ***Other: (CHADS2VASc if Afib, MMRC or CAT for COPD, ACT, DEXA)  Social History   Tobacco Use  Smoking Status Former Smoker  . Years: 2.00  . Types: Cigarettes  Smokeless Tobacco Never Used  Tobacco Comment   smoked sometimes not daily   BP Readings from Last 3 Encounters:  04/25/20 (!) 199/74  02/28/20 118/74  01/06/20 138/80   Pulse Readings from Last 3 Encounters:  04/25/20 (!) 50  02/28/20 64  01/06/20 66   Wt Readings from Last 3 Encounters:  02/28/20 121 lb (54.9 kg)  01/06/20 121 lb 12.8 oz (55.2 kg)  12/02/19 119 lb (54 kg)   BMI Readings from Last 3 Encounters:  02/28/20 23.95 kg/m  01/06/20 23.01 kg/m  12/02/19 22.48 kg/m    Assessment/Interventions: Review of patient  past medical history, allergies, medications, health status, including review of consultants reports, laboratory and other test data, was performed as part of comprehensive evaluation and provision of chronic care management services.   SDOH:  (Social Determinants of Health) assessments and interventions performed: {yes/no:20286}  SDOH Screenings   Alcohol Screen: Not on file  Depression (PHQ2-9): Medium Risk  . PHQ-2 Score: 6  Financial Resource Strain: Low Risk   . Difficulty of Paying Living Expenses: Not hard at all  Food Insecurity: No Food Insecurity  . Worried About Charity fundraiser in the Last Year: Never true  . Ran Out of Food in the Last Year: Never true  Housing: Low Risk   . Last Housing Risk Score: 0  Physical Activity: Sufficiently Active  . Days of Exercise per Week: 3 days  . Minutes of Exercise per Session: 60 min  Social Connections: Not on file  Stress: No Stress Concern Present  . Feeling of Stress : Only a little  Tobacco Use: Medium Risk  . Smoking Tobacco Use: Former Smoker  . Smokeless Tobacco Use: Never Used  Transportation Needs: No Transportation Needs  . Lack of Transportation (Medical): No  . Lack of Transportation (Non-Medical): No    CCM Care Plan  Allergies  Allergen Reactions  . Codeine Rash    Medications Reviewed Today    Reviewed by Mayford Knife, RPH (Pharmacist) on 05/18/20 at 1152  Med List Status: <None>  Medication Order Taking? Sig Documenting Provider Last Dose Status Informant  acetaminophen (TYLENOL) 500 MG tablet [id number]  Take 500 mg by mouth every 6 (six) hours as needed for mild pain or moderate pain. [provider]  Active Child  amLODipine (NORVASC) 2.5 MG tablet [id number] Yes TAKE 1 TABLET(2.5 MG) BY MOUTH TWICE DAILY  Patient taking differently: Take 2.5 mg by mouth 2 (two) times daily.   [name], MD Taking Active   Apoaequorin [facility] PO) [id number] Yes Take 1 capsule by mouth daily at 6 (six)  AM. [provider] Taking Active Child  ASPIRIN 81 PO [id number] Yes Take 81 mg by mouth as needed (for pain or headache).  [provider] Taking Active Child           Med Note ([place]   Sun Feb 22, 2018  2:53 PM)    B Complex Vitamins (B COMPLEX PO) [id number] Yes Take 1 capsule by mouth every other day. [provider] Taking Active Child  betamethasone dipropionate (DIPROLENE) 0.05 % cream [id number] No APPLY THIN LAYER EXTERNALLY TO THE AFFECTED AREA EVERY DAY AS NEEDED  Patient not taking: Reported on 05/18/2020  Minette Brine, FNP Not Taking Active   Calcium Carb-Cholecalciferol (CALCIUM 500 +D PO) [id number] Yes Take 1 tablet by mouth every other day. [provider] Taking Active Child  cholecalciferol (VITAMIN D) 1000 units tablet [id number] Yes Take 1,000 Units by mouth daily.  [provider] Taking Active Child           Med Note ([place]   Sun Feb 22, 2018  2:48 PM)    donepezil (ARICEPT) 5 MG tablet [id number] Yes TAKE 1 TABLET(5 MG) BY MOUTH AT BEDTIME  Patient taking differently: Take 5 mg by mouth at bedtime.   Suzzanne Cloud, NP Taking Active   Multiple Vitamins-Minerals (CENTRUM SILVER PO) [id number] Yes Take 1 tablet by mouth every other day. [provider] Taking Active Child  Multiple Vitamins-Minerals (PRESERVISION AREDS 2 PO) [id number] Yes Take 1 tablet by mouth 2 (two) times daily. [provider] Taking Active Child           Med Note Alfonse Spruce, ANH T   Tue Apr 25, 2020  5:27 PM) Confirmed taking BID and Centrum  Propylene Glycol (SYSTANE BALANCE OP) [id number] Yes Place 1 drop into both eyes daily as needed (for itchy eyes). [provider] Taking Active Child  sertraline (ZOLOFT) 25 MG tablet [id number] Yes TAKE 1 TABLET(25 MG) BY MOUTH DAILY  Patient taking differently: Take 25 mg by mouth daily.   [name], MD Taking Active   Theanine 100 MG CAPS [id number] Yes Take 1 capsule by  mouth daily as needed (sleep). [provider] Taking Active Child  vitamin C (ASCORBIC ACID) 500 MG tablet [id number] Yes Take 500 mg by mouth every other day. [provider] Taking Active Child          Patient Active Problem List   Diagnosis Date Noted  . Tremor 01/06/2020  . Age-related osteoporosis with current pathological fracture 11/05/2018  . Grief reaction 03/31/2018  . Hypertensive nephropathy 03/03/2018  . Chronic renal disease, stage III ([place]) 12/11/2017  . Hypertension, essential, benign 10/30/2017  . Anxiety 10/11/2017  . Memory disorder 09/04/2016  . Headache 04/27/2015  . Hypertension 01/16/2013  . UTI (lower urinary tract infection) 01/16/2013    Immunization History  Administered Date(s) Administered  . Fluad Quad(high Dose 65+) 09/25/2018  . Influenza, High Dose Seasonal PF 10/02/2016, 10/14/2019  . Influenza-Unspecified 10/22/2017  . PFIZER(Purple Top)SARS-COV-2 Vaccination 02/09/2019, 03/01/2019, 12/09/2019  . Pneumococcal Conjugate-13 11/28/2015  . Pneumococcal Polysaccharide-23 12/04/2016  . Zoster Recombinat (Shingrix) 09/25/2018, 12/04/2018    Conditions to be addressed/monitored:  {USCCMDZASSESSMENTOPTIONS:23563}  There are no care plans that you recently modified to display for this patient.    Medication Assistance: {MEDASSISTANCEINFO:25044}  Patient's preferred pharmacy is:  [facility] [phone] - [place], [place] - [address] [place] [address] Phone: [phone] Fax: [phone]  Uses pill box? {Yes or If no, why not?:20788} Pt endorses ***% compliance  We discussed: {Pharmacy options:24294} Patient decided to: {US Pharmacy Plan:23885}  Care Plan and Follow Up Patient Decision:  {FOLLOWUP:24991}  Plan: {CM FOLLOW UP YPPJ:09326}  ***   Current Barriers:  . {pharmacybarriers:24917}  Pharmacist Clinical Goal(s):  [name] Patient will  {PHARMACYGOALCHOICES:24921} through collaboration with PharmD and provider.   Interventions: . 1:1 collaboration with [name], MD regarding development and update of comprehensive plan of care as evidenced by provider attestation and co-signature . Inter-disciplinary care team collaboration (see longitudinal plan of care) .  Comprehensive medication review performed; medication list updated in electronic medical record  {CCM PHARMD DISEASE STATES:25130}  Patient Goals/Self-Care Activities . Patient will:  - {pharmacypatientgoals:24919}  Follow Up Plan: {CM FOLLOW UP ZQUR:15664}

## 2020-06-08 ENCOUNTER — Ambulatory Visit (INDEPENDENT_AMBULATORY_CARE_PROVIDER_SITE_OTHER): Payer: Medicare Other

## 2020-06-08 DIAGNOSIS — I10 Essential (primary) hypertension: Secondary | ICD-10-CM

## 2020-06-08 DIAGNOSIS — N183 Chronic kidney disease, stage 3 unspecified: Secondary | ICD-10-CM

## 2020-06-08 NOTE — Patient Instructions (Signed)
Social Worker Visit Information  Goals we discussed today:  Goals Addressed            This Visit's Progress   . COMPLETED: Work with SW to manage care coordination needs       Timeframe:  Long-Range Goal Priority:  Low Start Date:  12.6.21                           Expected End Date: 3.1.22                 Patient Goals/Self-Care Activities .  patient will:   - Patient will call provider office for new concerns or questions Remain engaged with [place] and PharmD Contact SW as needed        Materials Provided: Verbal education about health plan benefits provided by phone  Follow Up Plan: No SW follow up planned at this time. Please contact me as needed    Daneen Schick, BSW, CDP Social Worker, Certified Dementia Practitioner [place] [facility] [place] [facility] [phone]

## 2020-06-08 NOTE — Chronic Care Management (AMB) (Signed)
Chronic Care Management    Social Work Note  06/08/2020 Name: Whitney Henderson MRN: [id number] DOB: 10/12/1928  Parke Poisson Polsky is a 87 y.o. year old female who is a primary care patient of [name], MD. The CCM team was consulted to assist the patient with chronic disease management and/or care coordination needs related to: [employer] .   Engaged with patient by telephone for follow up visit in response to provider referral for social work chronic care management and care coordination services.   Consent to Services:  The patient was given information about Chronic Care Management services, agreed to services, and gave verbal consent prior to initiation of services.  Please see initial visit note for detailed documentation.   Patient agreed to services and consent obtained.   Assessment: Review of patient past medical history, allergies, medications, and health status, including review of relevant consultants reports was performed today as part of a comprehensive evaluation and provision of chronic care management and care coordination services.     SDOH (Social Determinants of Health) assessments and interventions performed:    Advanced Directives Status: Not addressed in this encounter.  CCM Care Plan  Allergies  Allergen Reactions  . Codeine Rash    Outpatient Encounter Medications as of 06/08/2020  Medication Sig Note  . acetaminophen (TYLENOL) 500 MG tablet Take 500 mg by mouth every 6 (six) hours as needed for mild pain or moderate pain.   [name] amLODipine (NORVASC) 2.5 MG tablet TAKE 1 TABLET(2.5 MG) BY MOUTH TWICE DAILY (Patient taking differently: Take 2.5 mg by mouth 2 (two) times daily.)   . Apoaequorin (PREVAGEN PO) Take 1 capsule by mouth daily at 6 (six) AM.   . ASPIRIN 81 PO Take 81 mg by mouth as needed (for pain or headache).    . B Complex Vitamins (B COMPLEX PO) Take 1 capsule by mouth every other day.   . betamethasone dipropionate (DIPROLENE) 0.05 %  cream APPLY THIN LAYER EXTERNALLY TO THE AFFECTED AREA EVERY DAY AS NEEDED (Patient not taking: Reported on 05/18/2020)   . Calcium Carb-Cholecalciferol (CALCIUM 500 +D PO) Take 1 tablet by mouth every other day.   . cholecalciferol (VITAMIN D) 1000 units tablet Take 1,000 Units by mouth daily.    [name] donepezil (ARICEPT) 5 MG tablet TAKE 1 TABLET(5 MG) BY MOUTH AT BEDTIME (Patient taking differently: Take 5 mg by mouth at bedtime.)   . Multiple Vitamins-Minerals (CENTRUM SILVER PO) Take 1 tablet by mouth every other day.   . Multiple Vitamins-Minerals (PRESERVISION AREDS 2 PO) Take 1 tablet by mouth 2 (two) times daily. 04/25/2020: Confirmed taking BID and Centrum  . Propylene Glycol (SYSTANE BALANCE OP) Place 1 drop into both eyes daily as needed (for itchy eyes).   . sertraline (ZOLOFT) 25 MG tablet TAKE 1 TABLET(25 MG) BY MOUTH DAILY (Patient taking differently: Take 25 mg by mouth daily.)   . Theanine 100 MG CAPS Take 1 capsule by mouth daily as needed (sleep).   . vitamin C (ASCORBIC ACID) 500 MG tablet Take 500 mg by mouth every other day.    No facility-administered encounter medications on file as of 06/08/2020.    Patient Active Problem List   Diagnosis Date Noted  . Tremor 01/06/2020  . Age-related osteoporosis with current pathological fracture 11/05/2018  . Grief reaction 03/31/2018  . Hypertensive nephropathy 03/03/2018  . Chronic renal disease, stage III ([place]) 12/11/2017  . Hypertension, essential, benign 10/30/2017  . Anxiety 10/11/2017  . Memory disorder  09/04/2016  . Headache 04/27/2015  . Hypertension 01/16/2013  . UTI (lower urinary tract infection) 01/16/2013    Conditions to be addressed/monitored: HTN and CKD Stage III; care coordination  Care Plan : Social Work Care Plan  Updates made by Daneen Schick since 06/08/2020 12:00 AM  Completed 06/08/2020  Problem: Care Coordination Resolved 06/08/2020  Priority: Low  Onset Date: 12/27/2019    Long-Range Goal: Collaborate  with RN Care Manager to perform appropriate assessments to assist with care coordination needs Completed 06/08/2020  Start Date: 12/27/2019  Expected End Date: 03/21/2020  Recent Progress: On track  Priority: Low  Note:   Current Barriers:  . Chronic disease management support and education needs related to HTN, CKD Stage III, and Hypertensive Nephropathy   Social Worker Clinical Goal(s):  [name] Over the next 120 days, patient will work with SW to identify and address any acute and/or chronic care coordination needs related to the self health management of HTN, CKD Stage III, and Hypertensive Nephropathy   CCM SW Interventions:   . Inter-disciplinary care team collaboration (see longitudinal plan of care) . Collaboration with [name], MD regarding development and update of comprehensive plan of care as evidenced by provider attestation and co-signature . Successful outbound call placed to the patient to assess for care coordination needs - spoke with the patient and her daughter [name] . Discussed the patient continues to do well in the home as her daughter remains involved and supportive with patient care needs . Reviewed health plan benefits with the patient to determine she is actively utilizing her over the counter benefit . Discussed plan for SW to close at this time with option to become re-involved as needed - patient agreed Patient Goals/Self-Care Activities .  patient will:   - Patient will call provider office for new concerns or questions Remain engaged with [place] and PharmD Contact SW as needed Follow Up Plan: The patient has been provided with contact information for the care management team and has been advised to call with any health related questions or concerns.        Follow Up Plan: No SW follow up planned at this time. The patient will remain engaged with RN Care Manager and PharmD      Daneen Schick, BSW, CDP Social Worker, Certified Dementia  Practitioner [place] [facility] [place] [facility] [phone]  Total time spent performing care coordination and/or care management activities with the patient by phone or face to face = 22 minutes.

## 2020-06-21 ENCOUNTER — Telehealth: Payer: Self-pay

## 2020-06-21 NOTE — Chronic Care Management (AMB) (Signed)
   Patient aware of telephone appointment with [name] [facility] on 06-22-2020. Patient aware to have/bring all medications, supplements, blood pressure and/or blood sugar logs to visit.  Questions: Have you had any recent office visit or specialist visit outside of [place]? Patient stated No.  Are there any concerns you would like to discuss during your office visit? Patient stated not at the moment.  Are you having any problems obtaining your medications? Patient stated no.  Star Rating Drug: None   [place] Clinical Pharmacist Assistant [phone]

## 2020-06-22 ENCOUNTER — Telehealth: Payer: Self-pay

## 2020-06-22 NOTE — Telephone Encounter (Signed)
  Care Management   Follow Up Note   06/22/2020 Name: Whitney Henderson MRN: [id number] DOB: [birth year]   Referred by: [name], MD Reason for referral : Chronic Care Management   Voice message received from patients daughter indicating she missed a call from [facility] during scheduled appointment time due to being at the patients home awaiting the call without her cell. [name] requests pharmacist contact her on the patients home phone number to complete visit.  Follow Up Plan: High priority in basket message sent to [name] PharmD.  Daneen Schick, BSW, CDP Social Worker, Certified Dementia Practitioner [place] [facility] [place] [facility] [phone]

## 2020-06-27 ENCOUNTER — Telehealth: Payer: Self-pay | Admitting: Internal Medicine

## 2020-06-27 NOTE — Telephone Encounter (Signed)
Error

## 2020-06-29 ENCOUNTER — Ambulatory Visit (INDEPENDENT_AMBULATORY_CARE_PROVIDER_SITE_OTHER): Payer: Medicare Other

## 2020-06-29 ENCOUNTER — Other Ambulatory Visit: Payer: Self-pay

## 2020-06-29 ENCOUNTER — Encounter: Payer: Self-pay | Admitting: Internal Medicine

## 2020-06-29 ENCOUNTER — Ambulatory Visit (INDEPENDENT_AMBULATORY_CARE_PROVIDER_SITE_OTHER): Payer: Medicare Other | Admitting: Internal Medicine

## 2020-06-29 VITALS — BP 132/60 | HR 93 | Temp 98.3°F | Ht 59.6 in | Wt 119.0 lb

## 2020-06-29 VITALS — BP 132/60 | Temp 98.3°F | Ht 59.6 in | Wt 119.0 lb

## 2020-06-29 DIAGNOSIS — Z0000 Encounter for general adult medical examination without abnormal findings: Secondary | ICD-10-CM | POA: Diagnosis not present

## 2020-06-29 DIAGNOSIS — I129 Hypertensive chronic kidney disease with stage 1 through stage 4 chronic kidney disease, or unspecified chronic kidney disease: Secondary | ICD-10-CM

## 2020-06-29 DIAGNOSIS — N1832 Chronic kidney disease, stage 3b: Secondary | ICD-10-CM | POA: Diagnosis not present

## 2020-06-29 DIAGNOSIS — F419 Anxiety disorder, unspecified: Secondary | ICD-10-CM

## 2020-06-29 DIAGNOSIS — R6889 Other general symptoms and signs: Secondary | ICD-10-CM | POA: Diagnosis not present

## 2020-06-29 NOTE — Patient Instructions (Signed)

## 2020-06-29 NOTE — Progress Notes (Signed)
I,Katawbba Wiggins,acting as a Neurosurgeon for [name], MD.,have documented all relevant documentation on the behalf of [name], MD,as directed by  [name], MD while in the presence of [name], MD.  This visit occurred during the SARS-CoV-2 public health emergency.  Safety protocols were in place, including screening questions prior to the visit, additional usage of staff PPE, and extensive cleaning of exam room while observing appropriate contact time as indicated for disinfecting solutions.  Subjective:     Patient ID: Whitney Henderson , female    DOB: 1928/08/06 , 87 y.o.   MRN: [id number]   Chief Complaint  Patient presents with   Hypertension    HPI  She presents for BP check. She reports compliance with meds. She denies headaches, chest pain and shortness of breath. She is also scheduled for AWV with [facility].   Hypertension This is a chronic problem. The current episode started more than 1 year ago. The problem has been gradually improving since onset. The problem is uncontrolled. Pertinent negatives include no blurred vision, chest pain, headaches, neck pain, orthopnea, palpitations or shortness of breath. The current treatment provides moderate improvement. Compliance problems include exercise.     Past Medical History:  Diagnosis Date   Anxiety    Cataracts, bilateral    Discoid lupus    Headache 04/27/2015   Left>right periorbital   Hypertension    Memory difficulties 09/04/2016     Family History  Problem Relation Age of Onset   Early death Mother    Cancer Father    Breast cancer Daughter        38s     Current Outpatient Medications:    acetaminophen (TYLENOL) 500 MG tablet, Take 500 mg by mouth every 6 (six) hours as needed for mild pain or moderate pain., Disp: , Rfl:    amLODipine (NORVASC) 2.5 MG tablet, TAKE 1 TABLET(2.5 MG) BY MOUTH TWICE DAILY (Patient taking differently: Take 2.5 mg by mouth 2 (two) times daily.), Disp: 180  tablet, Rfl: 1   Apoaequorin (PREVAGEN PO), Take 1 capsule by mouth daily at 6 (six) AM., Disp: , Rfl:    ASPIRIN 81 PO, Take 81 mg by mouth as needed (for pain or headache). , Disp: , Rfl:    B Complex Vitamins (B COMPLEX PO), Take 1 capsule by mouth every other day., Disp: , Rfl:    betamethasone dipropionate (DIPROLENE) 0.05 % cream, APPLY THIN LAYER EXTERNALLY TO THE AFFECTED AREA EVERY DAY AS NEEDED, Disp: 60 g, Rfl: 2   Calcium Carb-Cholecalciferol (CALCIUM 500 +D PO), Take 1 tablet by mouth every other day., Disp: , Rfl:    cholecalciferol (VITAMIN D) 1000 units tablet, Take 1,000 Units by mouth daily. , Disp: , Rfl:    donepezil (ARICEPT) 5 MG tablet, TAKE 1 TABLET(5 MG) BY MOUTH AT BEDTIME (Patient taking differently: Take 5 mg by mouth at bedtime.), Disp: 30 tablet, Rfl: 5   Multiple Vitamins-Minerals (CENTRUM SILVER PO), Take 1 tablet by mouth every other day., Disp: , Rfl:    Multiple Vitamins-Minerals (PRESERVISION AREDS 2 PO), Take 1 tablet by mouth 2 (two) times daily., Disp: , Rfl:    Propylene Glycol (SYSTANE BALANCE OP), Place 1 drop into both eyes daily as needed (for itchy eyes)., Disp: , Rfl:    sertraline (ZOLOFT) 25 MG tablet, TAKE 1 TABLET(25 MG) BY MOUTH DAILY (Patient taking differently: Take 25 mg by mouth daily.), Disp: 90 tablet, Rfl: 1  vitamin C (ASCORBIC ACID) 500 MG tablet, Take 500 mg by mouth every other day., Disp: , Rfl:    Theanine 100 MG CAPS, Take 1 capsule by mouth daily as needed (sleep). (Patient not taking: Reported on 06/29/2020), Disp: , Rfl:    Allergies  Allergen Reactions   Codeine Rash     Review of Systems  Constitutional: Negative.   Eyes:  Negative for blurred vision.  Respiratory: Negative.  Negative for shortness of breath.   Cardiovascular: Negative.  Negative for chest pain, palpitations and orthopnea.  Gastrointestinal: Negative.   Endocrine: Positive for cold intolerance.       She c/o feeling really cold upon awakening. She is not  sure what is triggering her sx. She does not feel "cold" when going to bed.   Musculoskeletal:  Negative for neck pain.  Neurological:  Negative for headaches.  Psychiatric/Behavioral: Negative.    All other systems reviewed and are negative.   Today's Vitals   06/29/20 1103  BP: 132/60  Pulse: 93  Temp: 98.3 F (36.8 C)  TempSrc: Oral  Weight: 119 lb (54 kg)  Height: 4' 11.6" (1.514 m)  PainSc: 0-No pain   Body mass index is 23.55 kg/m.  Wt Readings from Last 3 Encounters:  06/29/20 119 lb (54 kg)  06/29/20 119 lb (54 kg)  02/28/20 121 lb (54.9 kg)    BP Readings from Last 3 Encounters:  06/29/20 132/60  06/29/20 132/60  04/25/20 (!) 199/74    Objective:  Physical Exam Vitals and nursing note reviewed.  Constitutional:      Appearance: Normal appearance.  HENT:     Head: Normocephalic and atraumatic.  Cardiovascular:     Rate and Rhythm: Normal rate and regular rhythm.     Heart sounds: Normal heart sounds.  Pulmonary:     Effort: Pulmonary effort is normal.     Breath sounds: Normal breath sounds.  Skin:    General: Skin is warm.  Neurological:     General: No focal deficit present.     Mental Status: She is alert.  Psychiatric:        Mood and Affect: Mood normal.        Behavior: Behavior normal.        Assessment And Plan:     1. Hypertensive nephropathy Comments: Chronic, controlled.  She is encouraged to limit her sodium intake. I will check renal function today.  - BMP8+EGFR  2. Stage 3b chronic kidney disease ([place]) Comments: Chronic, this has been stable. She is reminded to stay well hydrated and keep BP controlled.   3. Anxiety Comments: Chronic, encouraged to continue with sertraline $RemoveBeforeD'25mg'imohgdfEepjsHB$  daily. She may also benefit from nightly magnesium supplementation.   4. Cold intolerance Comments: I will check TSH today. CBC normal in April. Encouraged to drink warm beverages upon awakening.    Patient was given opportunity to ask questions. Patient  verbalized understanding of the plan and was able to repeat key elements of the plan. All questions were answered to their satisfaction.   I, Maximino Greenland, MD, have reviewed all documentation for this visit. The documentation on 06/29/20 for the exam, diagnosis, procedures, and orders are all accurate and complete.   IF YOU HAVE BEEN REFERRED TO A SPECIALIST, IT MAY TAKE 1-2 WEEKS TO SCHEDULE/PROCESS THE REFERRAL. IF YOU HAVE NOT HEARD FROM US/SPECIALIST IN TWO WEEKS, PLEASE GIVE [place] A CALL AT [phone].   THE PATIENT IS ENCOURAGED TO PRACTICE SOCIAL DISTANCING DUE  TO THE COVID-19 PANDEMIC.

## 2020-06-29 NOTE — Patient Instructions (Signed)
Whitney Henderson , Thank you for taking time to come for your Medicare Wellness Visit. I appreciate your ongoing commitment to your health goals. Please review the following plan we discussed and let me know if I can assist you in the future.   Screening recommendations/referrals: Colonoscopy: not required Mammogram: completed 09/03/2019 Bone Density: completed 08/12/2018 Recommended yearly ophthalmology/optometry visit for glaucoma screening and checkup Recommended yearly dental visit for hygiene and checkup  Vaccinations: Influenza vaccine: completed 10/14/2019, due 08/21/2020 Pneumococcal vaccine: completed 12/04/2016 Tdap vaccine: decline Shingles vaccine: completed   Covid-19: 06/20/2020, 12/09/2019, 03/01/2019, 02/09/2019  Advanced directives: Please bring a copy of your POA (Power of Attorney) and/or Living Will to your next appointment.   Conditions/risks identified: none  Next appointment: Follow up in one year for your annual wellness visit    Preventive Care 65 Years and Older, Female Preventive care refers to lifestyle choices and visits with your health care provider that can promote health and wellness. What does preventive care include? A yearly physical exam. This is also called an annual well check. Dental exams once or twice a year. Routine eye exams. Ask your health care provider how often you should have your eyes checked. Personal lifestyle choices, including: Daily care of your teeth and gums. Regular physical activity. Eating a healthy diet. Avoiding tobacco and drug use. Limiting alcohol use. Practicing safe sex. Taking low-dose aspirin every day. Taking vitamin and mineral supplements as recommended by your health care provider. What happens during an annual well check? The services and screenings done by your health care provider during your annual well check will depend on your age, overall health, lifestyle risk factors, and family history of disease. Counseling   Your health care provider may ask you questions about your: Alcohol use. Tobacco use. Drug use. Emotional well-being. Home and relationship well-being. Sexual activity. Eating habits. History of falls. Memory and ability to understand (cognition). Work and work Statistician. Reproductive health. Screening  You may have the following tests or measurements: Height, weight, and BMI. Blood pressure. Lipid and cholesterol levels. These may be checked every 5 years, or more frequently if you are over 43 years old. Skin check. Lung cancer screening. You may have this screening every year starting at age 14 if you have a 30-pack-year history of smoking and currently smoke or have quit within the past 15 years. Fecal occult blood test (FOBT) of the stool. You may have this test every year starting at age 29. Flexible sigmoidoscopy or colonoscopy. You may have a sigmoidoscopy every 5 years or a colonoscopy every 10 years starting at age 22. Hepatitis C blood test. Hepatitis B blood test. Sexually transmitted disease (STD) testing. Diabetes screening. This is done by checking your blood sugar (glucose) after you have not eaten for a while (fasting). You may have this done every 1-3 years. Bone density scan. This is done to screen for osteoporosis. You may have this done starting at age 9. Mammogram. This may be done every 1-2 years. Talk to your health care provider about how often you should have regular mammograms. Talk with your health care provider about your test results, treatment options, and if necessary, the need for more tests. Vaccines  Your health care provider may recommend certain vaccines, such as: Influenza vaccine. This is recommended every year. Tetanus, diphtheria, and acellular pertussis (Tdap, Td) vaccine. You may need a Td booster every 10 years. Zoster vaccine. You may need this after age 66. Pneumococcal 13-valent conjugate (PCV13) vaccine. One dose  is recommended  after age 49. Pneumococcal polysaccharide (PPSV23) vaccine. One dose is recommended after age 44. Talk to your health care provider about which screenings and vaccines you need and how often you need them. This information is not intended to replace advice given to you by your health care provider. Make sure you discuss any questions you have with your health care provider. Document Released: 02/03/2015 Document Revised: 09/27/2015 Document Reviewed: 11/08/2014 Elsevier Interactive Patient Education  2017 [place] Prevention in the Home Falls can cause injuries. They can happen to people of all ages. There are many things you can do to make your home safe and to help prevent falls. What can I do on the outside of my home? Regularly fix the edges of walkways and driveways and fix any cracks. Remove anything that might make you trip as you walk through a door, such as a raised step or threshold. Trim any bushes or trees on the path to your home. Use bright outdoor lighting. Clear any walking paths of anything that might make someone trip, such as rocks or tools. Regularly check to see if handrails are loose or broken. Make sure that both sides of any steps have handrails. Any raised decks and porches should have guardrails on the edges. Have any leaves, snow, or ice cleared regularly. Use sand or salt on walking paths during winter. Clean up any spills in your garage right away. This includes oil or grease spills. What can I do in the bathroom? Use night lights. Install grab bars by the toilet and in the tub and shower. Do not use towel bars as grab bars. Use non-skid mats or decals in the tub or shower. If you need to sit down in the shower, use a plastic, non-slip stool. Keep the floor dry. Clean up any water that spills on the floor as soon as it happens. Remove soap buildup in the tub or shower regularly. Attach bath mats securely with double-sided non-slip rug tape. Do not  have throw rugs and other things on the floor that can make you trip. What can I do in the bedroom? Use night lights. Make sure that you have a light by your bed that is easy to reach. Do not use any sheets or blankets that are too big for your bed. They should not hang down onto the floor. Have a firm chair that has side arms. You can use this for support while you get dressed. Do not have throw rugs and other things on the floor that can make you trip. What can I do in the kitchen? Clean up any spills right away. Avoid walking on wet floors. Keep items that you use a lot in easy-to-reach places. If you need to reach something above you, use a strong step stool that has a grab bar. Keep electrical cords out of the way. Do not use floor polish or wax that makes floors slippery. If you must use wax, use non-skid floor wax. Do not have throw rugs and other things on the floor that can make you trip. What can I do with my stairs? Do not leave any items on the stairs. Make sure that there are handrails on both sides of the stairs and use them. Fix handrails that are broken or loose. Make sure that handrails are as long as the stairways. Check any carpeting to make sure that it is firmly attached to the stairs. Fix any carpet that is loose or worn. Avoid  having throw rugs at the top or bottom of the stairs. If you do have throw rugs, attach them to the floor with carpet tape. Make sure that you have a light switch at the top of the stairs and the bottom of the stairs. If you do not have them, ask someone to add them for you. What else can I do to help prevent falls? Wear shoes that: Do not have high heels. Have rubber bottoms. Are comfortable and fit you well. Are closed at the toe. Do not wear sandals. If you use a stepladder: Make sure that it is fully opened. Do not climb a closed stepladder. Make sure that both sides of the stepladder are locked into place. Ask someone to hold it for  you, if possible. Clearly mark and make sure that you can see: Any grab bars or handrails. First and last steps. Where the edge of each step is. Use tools that help you move around (mobility aids) if they are needed. These include: Canes. Walkers. Scooters. Crutches. Turn on the lights when you go into a dark area. Replace any light bulbs as soon as they burn out. Set up your furniture so you have a clear path. Avoid moving your furniture around. If any of your floors are uneven, fix them. If there are any pets around you, be aware of where they are. Review your medicines with your doctor. Some medicines can make you feel dizzy. This can increase your chance of falling. Ask your doctor what other things that you can do to help prevent falls. This information is not intended to replace advice given to you by your health care provider. Make sure you discuss any questions you have with your health care provider. Document Released: 11/03/2008 Document Revised: 06/15/2015 Document Reviewed: 02/11/2014 Elsevier Interactive Patient Education  2017 [employer].

## 2020-06-29 NOTE — Progress Notes (Signed)
This visit occurred during the SARS-CoV-2 public health emergency.  Safety protocols were in place, including screening questions prior to the visit, additional usage of staff PPE, and extensive cleaning of exam room while observing appropriate contact time as indicated for disinfecting solutions.  Subjective:   Whitney Henderson is a 87 y.o. female who presents for Medicare Annual (Subsequent) preventive examination.  Review of Systems     Cardiac Risk Factors include: advanced age (>73men, >19 women);hypertension;sedentary lifestyle     Objective:    Today's Vitals   06/29/20 1125  BP: 132/60  Temp: 98.3 F (36.8 C)  TempSrc: Oral  Weight: 119 lb (54 kg)  Height: 4' 11.6" (1.514 m)   Body mass index is 23.55 kg/m.  Advanced Directives 06/29/2020 04/25/2020 12/02/2019 12/01/2018 10/13/2018 02/22/2018 10/30/2017  Does Patient Have a Medical Advance Directive? Yes No Yes Yes Yes No Yes  Type of Paramedic of [place];Living will - [place];Living will [place];Living will Healthcare Power of [place];Living will  Does patient want to make changes to medical advance directive? - - - - No - Patient declined - No - Patient declined  Copy of [place] in Chart? No - copy requested - No - copy requested No - copy requested No - copy requested - No - copy requested  Would patient like information on creating a medical advance directive? - No - Patient declined - - - No - Patient declined -    Current Medications (verified) Outpatient Encounter Medications as of 06/29/2020  Medication Sig   acetaminophen (TYLENOL) 500 MG tablet Take 500 mg by mouth every 6 (six) hours as needed for mild pain or moderate pain.   amLODipine (NORVASC) 2.5 MG tablet TAKE 1 TABLET(2.5 MG) BY MOUTH TWICE DAILY (Patient taking differently: Take 2.5 mg by mouth 2 (two) times daily.)   Apoaequorin (PREVAGEN  PO) Take 1 capsule by mouth daily at 6 (six) AM.   ASPIRIN 81 PO Take 81 mg by mouth as needed (for pain or headache).    B Complex Vitamins (B COMPLEX PO) Take 1 capsule by mouth every other day.   betamethasone dipropionate (DIPROLENE) 0.05 % cream APPLY THIN LAYER EXTERNALLY TO THE AFFECTED AREA EVERY DAY AS NEEDED   Calcium Carb-Cholecalciferol (CALCIUM 500 +D PO) Take 1 tablet by mouth every other day.   cholecalciferol (VITAMIN D) 1000 units tablet Take 1,000 Units by mouth daily.    donepezil (ARICEPT) 5 MG tablet TAKE 1 TABLET(5 MG) BY MOUTH AT BEDTIME (Patient taking differently: Take 5 mg by mouth at bedtime.)   Multiple Vitamins-Minerals (CENTRUM SILVER PO) Take 1 tablet by mouth every other day.   Multiple Vitamins-Minerals (PRESERVISION AREDS 2 PO) Take 1 tablet by mouth 2 (two) times daily.   Propylene Glycol (SYSTANE BALANCE OP) Place 1 drop into both eyes daily as needed (for itchy eyes).   sertraline (ZOLOFT) 25 MG tablet TAKE 1 TABLET(25 MG) BY MOUTH DAILY (Patient taking differently: Take 25 mg by mouth daily.)   Theanine 100 MG CAPS Take 1 capsule by mouth daily as needed (sleep). (Patient not taking: Reported on 06/29/2020)   vitamin C (ASCORBIC ACID) 500 MG tablet Take 500 mg by mouth every other day.   No facility-administered encounter medications on file as of 06/29/2020.    Allergies (verified) Codeine   History: Past Medical History:  Diagnosis Date   Anxiety    Cataracts, bilateral  Discoid lupus    Headache 04/27/2015   Left>right periorbital   Hypertension    Memory difficulties 09/04/2016   Past Surgical History:  Procedure Laterality Date   ABDOMINAL HYSTERECTOMY     BREAST EXCISIONAL BIOPSY Left over 76 ys ago   benign   CHOLECYSTECTOMY     EXCISIONAL HEMORRHOIDECTOMY     Family History  Problem Relation Age of Onset   Early death Mother    Cancer Father    Breast cancer Daughter        63s   Social History   Socioeconomic History   Marital  status: Widowed    Spouse name: Izell [place]    Number of children: 4   Years of education: College- Masters   [employer] education level: Not on file  Occupational History   Not on file  Tobacco Use   Smoking status: Former    Years: 2.00    Pack years: 0.00    Types: Cigarettes   Smokeless tobacco: Never   Tobacco comments:    smoked sometimes not daily  Vaping Use   Vaping Use: Never used  Substance and Sexual Activity   Alcohol use: No   Drug use: No   Sexual activity: Not Currently  Other Topics Concern   Not on file  Social History Narrative   Live with husband,  4 children.    Caffeine: 1 cup daily    Social Determinants of Health   Financial Resource Strain: Low Risk    Difficulty of Paying Living Expenses: Not hard at all  Food Insecurity: No Food Insecurity   Worried About Charity fundraiser in the Last Year: Never true   Arboriculturist in the Last Year: Never true  Transportation Needs: No Transportation Needs   Lack of Transportation (Medical): No   Lack of Transportation (Non-Medical): No  Physical Activity: Inactive   Days of Exercise per Week: 0 days   Minutes of Exercise per Session: 0 min  Stress: No Stress Concern Present   Feeling of Stress : Not at all  Social Connections: Not on file    Tobacco Counseling Counseling given: Not Answered Tobacco comments: smoked sometimes not daily   Clinical Intake:  Pre-visit preparation completed: Yes  Pain : No/denies pain     Nutritional Status: BMI of 19-24  Normal Nutritional Risks: None Diabetes: No  How often do you need to have someone help you when you read instructions, pamphlets, or other written materials from your doctor or pharmacy?: 1 - Never  Diabetic?no  Interpreter Needed?: No  Information entered by :: NAllen LPN   Activities of Daily Living In your present state of health, do you have any difficulty performing the following activities: 06/29/2020 12/02/2019  Hearing? N Y   Comment - sometimes  Vision? Y Y  Comment - sometimes  Difficulty concentrating or making decisions? N Y  Comment - sometimes  Walking or climbing stairs? N N  Dressing or bathing? N N  Doing errands, shopping? N [place] and eating ? N N  Using the Toilet? N N  In the past six months, have you accidently leaked urine? Y Y  Do you have problems with loss of bowel control? N N  Managing your Medications? N N  Managing your Finances? N N  Housekeeping or managing your Housekeeping? N N  Some recent data might be hidden    Patient Care Team: [name], MD as PCP -  General (Internal Medicine) Monna Fam, MD as Consulting Physician (Ophthalmology) Rex Kras Claudette Stapler, RN as Case Manager Mayford Knife, [facility] (Pharmacist)  Indicate any recent Medical Services you may have received from other than [facility] providers in the past year (date may be approximate).     Assessment:   This is a routine wellness examination for Reilynn.  Hearing/Vision screen No results found.  Dietary issues and exercise activities discussed: Current Exercise Habits: The patient does not participate in regular exercise at present   Goals Addressed             This Visit's Progress    Patient Stated       06/29/2020, feel better        Depression Screen PHQ 2/9 Scores 06/29/2020 12/02/2019 05/19/2019 12/01/2018 10/13/2018 05/26/2018 02/09/2018  PHQ - 2 Score 0 3 1 1 1  0 6  PHQ- 9 Score - 6 2 2 2  - 8  Exception Documentation - Other- indicate reason in comment box - - - - -  Not completed - lready completed by CMA - - - - -    Fall Risk Fall Risk  06/29/2020 12/02/2019 12/02/2019 12/01/2018 11/05/2018  Falls in the past year? 0 0 0 0 0  Number falls in past yr: - - - 0 -  Risk for fall due to : Medication side effect Medication side effect - Medication side effect -  Follow up Falls evaluation completed;Education provided;Falls prevention discussed Falls evaluation  completed;Education provided;Falls prevention discussed - Falls evaluation completed;Education provided;Falls prevention discussed -    FALL RISK PREVENTION PERTAINING TO THE HOME:  Any stairs in or around the home? Yes  If so, are there any without handrails? No  Home free of loose throw rugs in walkways, pet beds, electrical cords, etc? Yes  Adequate lighting in your home to reduce risk of falls? Yes   ASSISTIVE DEVICES UTILIZED TO PREVENT FALLS:  Life alert? No  Use of a cane, walker or w/c? No  Grab bars in the bathroom? Yes  Shower chair or bench in shower? Yes  Elevated toilet seat or a handicapped toilet? No   TIMED UP AND GO:  Was the test performed? No .    Gait slow and steady without use of assistive device  Cognitive Function: MMSE - Mini Mental State Exam 01/06/2020 07/14/2019 09/04/2016  Orientation to time 4 4 4   Orientation to Place 5 5 5   Registration 3 3 3   Attention/ Calculation 1 3 5   Recall 3 1 0  Language- name 2 objects 2 2 2   Language- repeat 0 1 1  Language- follow 3 step command 3 3 3   Language- read & follow direction 1 1 1   Write a sentence 1 1 1   Copy design 1 0 1  Total score 24 24 26      6CIT Screen 06/29/2020 12/02/2019 12/01/2018 10/30/2017  What Year? 4 points 0 points 0 points 0 points  What month? 0 points 0 points 0 points 0 points  What time? 0 points 0 points 0 points 0 points  Count back from 20 0 points 0 points 0 points 0 points  Months in reverse 2 points 4 points 4 points 2 points  Repeat phrase 10 points 8 points 10 points 2 points  Total Score 16 12 14 4     Immunizations Immunization History  Administered Date(s) Administered   Fluad Quad(high Dose 65+) 09/25/2018   Influenza, High Dose Seasonal PF 10/02/2016, 10/14/2019   Influenza-Unspecified 10/22/2017  PFIZER(Purple Top)SARS-COV-2 Vaccination 02/09/2019, 03/01/2019, 12/09/2019   Pneumococcal Conjugate-13 11/28/2015   Pneumococcal Polysaccharide-23 12/04/2016    Zoster Recombinat (Shingrix) 09/25/2018, 12/04/2018    TDAP status: Due, Education has been provided regarding the importance of this vaccine. Advised may receive this vaccine at local pharmacy or Health Dept. Aware to provide a copy of the vaccination record if obtained from local pharmacy or Health Dept. Verbalized acceptance and understanding.  Flu Vaccine status: Up to date  Pneumococcal vaccine status: Up to date  Covid-19 vaccine status: Completed vaccines  Qualifies for Shingles Vaccine? Yes   Zostavax completed No   Shingrix Completed?: Yes  Screening Tests Health Maintenance  Topic Date Due   TETANUS/TDAP  Never done   COVID-19 Vaccine (4 - Booster for Pfizer series) 04/07/2020   INFLUENZA VACCINE  08/21/2020   DEXA SCAN  Completed   PNA vac Low Risk Adult  Completed   Zoster Vaccines- Shingrix  Completed   Pneumococcal Vaccine 37-32 Years old  Aged Out   HPV VACCINES  Aged Out    Health Maintenance  Health Maintenance Due  Topic Date Due   TETANUS/TDAP  Never done   COVID-19 Vaccine (4 - Booster for [place] series) 04/07/2020    Colorectal cancer screening: No longer required.   Mammogram status: Completed 09/03/2019. Repeat every year  Bone Density status: Ordered 08/12/2018. Pt provided with contact info and advised to call to schedule appt.  Lung Cancer Screening: (Low Dose CT Chest recommended if Age 21-80 years, 30 pack-year currently smoking OR have quit w/in 15years.) does not qualify.   Lung Cancer Screening Referral: no  Additional Screening:  Hepatitis C Screening: does not qualify;  Vision Screening: Recommended annual ophthalmology exams for early detection of glaucoma and other disorders of the eye. Is the patient up to date with their annual eye exam?  Yes  Who is the provider or what is the name of the office in which the patient attends annual eye exams? Dr. Herbert Deaner If pt is not established with a provider, would they like to be referred to a  provider to establish care? No .   Dental Screening: Recommended annual dental exams for proper oral hygiene  Community Resource Referral / Chronic Care Management: CRR required this visit?  No   CCM required this visit?  No      Plan:     I have personally reviewed and noted the following in the patient's chart:   Medical and social history Use of alcohol, tobacco or illicit drugs  Current medications and supplements including opioid prescriptions.  Functional ability and status Nutritional status Physical activity Advanced directives List of other physicians Hospitalizations, surgeries, and ER visits in previous 12 months Vitals Screenings to include cognitive, depression, and falls Referrals and appointments  In addition, I have reviewed and discussed with patient certain preventive protocols, quality metrics, and best practice recommendations. A written personalized care plan for preventive services as well as general preventive health recommendations were provided to patient.     Kellie Simmering, LPN   09/23/8099   Nurse Notes:

## 2020-06-30 LAB — BMP8+EGFR
BUN/Creatinine Ratio: 17 (ref 12–28)
BUN: 25 mg/dL (ref 10–36)
CO2: 24 mmol/L (ref 20–29)
Calcium: 9.3 mg/dL (ref 8.7–10.3)
Chloride: 103 mmol/L (ref 96–106)
Creatinine, Ser: 1.45 mg/dL — ABNORMAL HIGH (ref 0.57–1.00)
Glucose: 90 mg/dL (ref 65–99)
Potassium: 4.6 mmol/L (ref 3.5–5.2)
Sodium: 141 mmol/L (ref 134–144)
eGFR: 34 mL/min/{1.73_m2} — ABNORMAL LOW (ref >59)

## 2020-07-03 ENCOUNTER — Telehealth: Payer: Medicare Other

## 2020-07-07 ENCOUNTER — Telehealth: Payer: Self-pay

## 2020-07-07 NOTE — Chronic Care Management (AMB) (Signed)
Chronic Care Management Pharmacy Assistant   Name: MARRY KUSCH  MRN: [id number] DOB: 08/18/1928   Reason for Encounter: Disease State/ Hypertension   Recent office visits:  06-08-2020 Daneen Schick (CCM)  06-29-2020 [name], MD. BMP8+EGFR Creatinine, Ser= 1.45, EFGR= 34.  Recent consult visits:  None  Hospital visits:  None in previous 6 months  Medications: Outpatient Encounter Medications as of 07/07/2020  Medication Sig Note   acetaminophen (TYLENOL) 500 MG tablet Take 500 mg by mouth every 6 (six) hours as needed for mild pain or moderate pain.    amLODipine (NORVASC) 2.5 MG tablet TAKE 1 TABLET(2.5 MG) BY MOUTH TWICE DAILY (Patient taking differently: Take 2.5 mg by mouth 2 (two) times daily.)    Apoaequorin (PREVAGEN PO) Take 1 capsule by mouth daily at 6 (six) AM.    ASPIRIN 81 PO Take 81 mg by mouth as needed (for pain or headache).     B Complex Vitamins (B COMPLEX PO) Take 1 capsule by mouth every other day.    betamethasone dipropionate (DIPROLENE) 0.05 % cream APPLY THIN LAYER EXTERNALLY TO THE AFFECTED AREA EVERY DAY AS NEEDED    Calcium Carb-Cholecalciferol (CALCIUM 500 +D PO) Take 1 tablet by mouth every other day.    cholecalciferol (VITAMIN D) 1000 units tablet Take 1,000 Units by mouth daily.     donepezil (ARICEPT) 5 MG tablet TAKE 1 TABLET(5 MG) BY MOUTH AT BEDTIME (Patient taking differently: Take 5 mg by mouth at bedtime.)    Multiple Vitamins-Minerals (CENTRUM SILVER PO) Take 1 tablet by mouth every other day.    Multiple Vitamins-Minerals (PRESERVISION AREDS 2 PO) Take 1 tablet by mouth 2 (two) times daily. 04/25/2020: Confirmed taking BID and Centrum   Propylene Glycol (SYSTANE BALANCE OP) Place 1 drop into both eyes daily as needed (for itchy eyes).    sertraline (ZOLOFT) 25 MG tablet TAKE 1 TABLET(25 MG) BY MOUTH DAILY (Patient taking differently: Take 25 mg by mouth daily.)    Theanine 100 MG CAPS Take 1 capsule by mouth daily as needed  (sleep). (Patient not taking: Reported on 06/29/2020)    vitamin C (ASCORBIC ACID) 500 MG tablet Take 500 mg by mouth every other day.    No facility-administered encounter medications on file as of 07/07/2020.   Reviewed chart prior to disease state call. Spoke with patient regarding BP  Recent Office Vitals: BP Readings from Last 3 Encounters:  06/29/20 132/60  06/29/20 132/60  04/25/20 (!) 199/74   Pulse Readings from Last 3 Encounters:  06/29/20 93  04/25/20 (!) 50  02/28/20 64    Wt Readings from Last 3 Encounters:  06/29/20 119 lb (54 kg)  06/29/20 119 lb (54 kg)  02/28/20 121 lb (54.9 kg)     Kidney Function Lab Results  Component Value Date/Time   CREATININE 1.45 (H) 06/29/2020 12:44 PM   CREATININE 1.41 (H) 04/25/2020 01:42 PM   GFRNONAA 35 (L) 04/25/2020 01:42 PM   GFRAA 40 (L) 02/28/2020 04:48 PM    BMP Latest Ref Rng & Units 06/29/2020 04/25/2020 02/28/2020  Glucose 65 - 99 mg/dL 90 97 83  BUN 10 - 36 mg/dL 25 21 28(H)  Creatinine 0.57 - 1.00 mg/dL 1.45(H) 1.41(H) 1.35(H)  BUN/Creat Ratio 12 - 28 17 - 21  Sodium 134 - 144 mmol/L 141 137 139  Potassium 3.5 - 5.2 mmol/L 4.6 4.1 4.6  Chloride 96 - 106 mmol/L 103 105 102  CO2 20 - 29 mmol/L 24 27 25  Calcium 8.7 - 10.3 mg/dL 9.3 8.9 9.7    Current antihypertensive regimen:  Amlodipine 2.5 mg tablet twice per day.  How often are you checking your Blood Pressure? 1-2x per week  Current home BP readings: 122/74  What recent interventions/DTPs have been made by any provider to improve Blood Pressure control since last CPP Visit: Daily salt intake goal < 2300 mg, Importance of home blood pressure monitoring,Proper BP monitoring technique. Patient states she is taking medications as directed.  Any recent hospitalizations or ED visits since last visit with CPP? No  What diet changes have been made to improve Blood Pressure Control?  Patient typically eats egg whites, drink a cup of tea, drinks plenty of water, eats  cereal during the day. Patient states she limits her salt intake.  What exercise is being done to improve your Blood Pressure Control?  Patient states she doesn't walk outside during the summer since she can't handle heat well. Patient states she goes to [facility] a few day out of the week.  Adherence Review: Is the patient currently on ACE/ARB medication? No Does the patient have >5 day gap between last estimated fill dates? No  NOTES: Patient states she is managing blood pressure pretty good. Patient states she somehow didn't receive a call from [name] on her recent telephone appointment but has recently seen her PCP. Sent scheduling a message to schedule patient with [name] on 08-22-2020 at 3:00.  Star Rating Drugs: None  [place] Clinical Pharmacist Assistant [phone]

## 2020-08-01 ENCOUNTER — Telehealth: Payer: Self-pay

## 2020-08-01 ENCOUNTER — Telehealth: Payer: Medicare Other

## 2020-08-01 NOTE — Telephone Encounter (Signed)
  Care Management   Follow Up Note   08/01/2020 Name: Whitney Henderson MRN: [id number] DOB: October 14, 1928   Referred by: [name], MD Reason for referral : Chronic Care Management (RN CM Follow up call )   An unsuccessful telephone outreach was attempted today. The patient was referred to the case management team for assistance with care management and care coordination.   Follow Up Plan: Telephone follow up appointment with care management team member scheduled for: 08/02/20  Barb Merino, RN, BSN, CCM Care Management Coordinator [place] [facility]  Direct Phone: [phone]

## 2020-08-02 ENCOUNTER — Telehealth: Payer: Medicare Other

## 2020-08-02 ENCOUNTER — Ambulatory Visit (INDEPENDENT_AMBULATORY_CARE_PROVIDER_SITE_OTHER): Payer: Medicare Other

## 2020-08-02 DIAGNOSIS — I10 Essential (primary) hypertension: Secondary | ICD-10-CM | POA: Diagnosis not present

## 2020-08-02 DIAGNOSIS — I129 Hypertensive chronic kidney disease with stage 1 through stage 4 chronic kidney disease, or unspecified chronic kidney disease: Secondary | ICD-10-CM

## 2020-08-02 DIAGNOSIS — M810 Age-related osteoporosis without current pathological fracture: Secondary | ICD-10-CM | POA: Diagnosis not present

## 2020-08-02 DIAGNOSIS — N1832 Chronic kidney disease, stage 3b: Secondary | ICD-10-CM

## 2020-08-02 DIAGNOSIS — N183 Chronic kidney disease, stage 3 unspecified: Secondary | ICD-10-CM

## 2020-08-03 ENCOUNTER — Ambulatory Visit: Payer: Self-pay

## 2020-08-03 ENCOUNTER — Telehealth: Payer: Medicare Other

## 2020-08-03 ENCOUNTER — Telehealth: Payer: Self-pay

## 2020-08-03 DIAGNOSIS — N183 Chronic kidney disease, stage 3 unspecified: Secondary | ICD-10-CM

## 2020-08-03 DIAGNOSIS — M810 Age-related osteoporosis without current pathological fracture: Secondary | ICD-10-CM

## 2020-08-03 DIAGNOSIS — I10 Essential (primary) hypertension: Secondary | ICD-10-CM

## 2020-08-03 DIAGNOSIS — I129 Hypertensive chronic kidney disease with stage 1 through stage 4 chronic kidney disease, or unspecified chronic kidney disease: Secondary | ICD-10-CM

## 2020-08-03 NOTE — Chronic Care Management (AMB) (Signed)
Chronic Care Management Pharmacy Assistant   Name: Whitney Henderson  MRN: [id number] DOB: Oct 03, 1928   Reason for Encounter: Disease State/ Hypertension   Recent office visits:  08-02-2020 LittleClaudette Stapler, RN (CCM)  08-03-2020 Little, Claudette Stapler, RN (CCM)  Recent consult visits:  None  Hospital visits:  Medication Reconciliation was completed by comparing discharge summary, patient's EMR and Pharmacy list, and upon discussion with patient.  Admitted to the hospital on 04-25-2020 due to bad headache. Discharge date was 04-25-2020. Discharged from [place]?Medications Started at [facility] Discharge:?? None  Medication Changes at Hospital Discharge: None  Medications Discontinued at Hospital Discharge: None  Medications that remain the same after Hospital Discharge:??  -All other medications will remain the same.    Medications: Outpatient Encounter Medications as of 08/03/2020  Medication Sig Note   acetaminophen (TYLENOL) 500 MG tablet Take 500 mg by mouth every 6 (six) hours as needed for mild pain or moderate pain.    amLODipine (NORVASC) 2.5 MG tablet TAKE 1 TABLET(2.5 MG) BY MOUTH TWICE DAILY (Patient taking differently: Take 2.5 mg by mouth 2 (two) times daily.)    Apoaequorin (PREVAGEN PO) Take 1 capsule by mouth daily at 6 (six) AM.    ASPIRIN 81 PO Take 81 mg by mouth as needed (for pain or headache).     B Complex Vitamins (B COMPLEX PO) Take 1 capsule by mouth every other day.    betamethasone dipropionate (DIPROLENE) 0.05 % cream APPLY THIN LAYER EXTERNALLY TO THE AFFECTED AREA EVERY DAY AS NEEDED    Calcium Carb-Cholecalciferol (CALCIUM 500 +D PO) Take 1 tablet by mouth every other day.    cholecalciferol (VITAMIN D) 1000 units tablet Take 1,000 Units by mouth daily.    donepezil (ARICEPT) 5 MG tablet TAKE 1 TABLET(5 MG) BY MOUTH AT BEDTIME (Patient not taking: Reported on 08/02/2020)    Multiple Vitamins-Minerals (CENTRUM SILVER PO) Take 1  tablet by mouth every other day.    Multiple Vitamins-Minerals (PRESERVISION AREDS 2 PO) Take 1 tablet by mouth 2 (two) times daily. 04/25/2020: Confirmed taking BID and Centrum   Propylene Glycol (SYSTANE BALANCE OP) Place 1 drop into both eyes daily as needed (for itchy eyes).    sertraline (ZOLOFT) 25 MG tablet TAKE 1 TABLET(25 MG) BY MOUTH DAILY (Patient taking differently: Take 25 mg by mouth daily.)    Theanine 100 MG CAPS Take 1 capsule by mouth daily as needed (sleep).    vitamin C (ASCORBIC ACID) 500 MG tablet Take 500 mg by mouth every other day.    No facility-administered encounter medications on file as of 08/03/2020.   Reviewed chart prior to disease state call. Spoke with patient regarding BP  Recent Office Vitals: BP Readings from Last 3 Encounters:  06/29/20 132/60  06/29/20 132/60  04/25/20 (!) 199/74   Pulse Readings from Last 3 Encounters:  06/29/20 93  04/25/20 (!) 50  02/28/20 64    Wt Readings from Last 3 Encounters:  06/29/20 119 lb (54 kg)  06/29/20 119 lb (54 kg)  02/28/20 121 lb (54.9 kg)     Kidney Function Lab Results  Component Value Date/Time   CREATININE 1.45 (H) 06/29/2020 12:44 PM   CREATININE 1.41 (H) 04/25/2020 01:42 PM   GFRNONAA 35 (L) 04/25/2020 01:42 PM   GFRAA 40 (L) 02/28/2020 04:48 PM    BMP Latest Ref Rng & Units 06/29/2020 04/25/2020 02/28/2020  Glucose 65 - 99 mg/dL 90 97 83  BUN 10 - 36 mg/dL 25 21 28(H)  Creatinine 0.57 - 1.00 mg/dL 1.45(H) 1.41(H) 1.35(H)  BUN/Creat Ratio 12 - 28 17 - 21  Sodium 134 - 144 mmol/L 141 137 139  Potassium 3.5 - 5.2 mmol/L 4.6 4.1 4.6  Chloride 96 - 106 mmol/L 103 105 102  CO2 20 - 29 mmol/L 24 27 25   Calcium 8.7 - 10.3 mg/dL 9.3 8.9 9.7    Current antihypertensive regimen:  Amlodipine 2.5 mg tablet twice per day  How often are you checking your Blood Pressure? 1-2x per week  Current home BP readings: 125/69  What recent interventions/DTPs have been made by any provider to improve Blood  Pressure control since last CPP Visit:  Daily salt intake goal < 2300 mg Educated on the importance of patient taking BP at least once per day Educated on BP goals and benefits of medications for prevention of heart attack, stroke and kidney damage  Any recent hospitalizations or ED visits since last visit with CPP? No  What diet changes have been made to improve Blood Pressure Control?  Patient daughter states patient typically eats egg whites, drink a cup of tea, drinks plenty of water, eats cereal during the day. Patient daughter states she limits her salt intake.  What exercise is being done to improve your Blood Pressure Control?  Patient daughter states she doesn't walk outside during the summer since she can't handle heat well. Patient daughter states she goes to [facility] a few day out of the week.  Adherence Review: Is the patient currently on ACE/ARB medication? No Does the patient have >5 day gap between last estimated fill dates? No  NOTES: Patient daughter states patient patient's blood pressure has been overall normal. Patient daughter states it was elevated about a week ago because they were out all day in the heat. Daughter is aware of rescheduled telephone appointment with [name] CPP on 08-22-2020 at 3:00 and will like to be present during the visit.    Care Gaps: Tdap overdue Medicare wellness 07-19-2021 RAF= 0.952 %  Star Rating Drugs: None  Jeannette How [place] Clinical Pharmacist Assistant [phone]

## 2020-08-15 NOTE — Chronic Care Management (AMB) (Signed)
Chronic Care Management   CCM RN Visit Note  08/02/2020 Name: Whitney Henderson MRN: [id number] DOB: [birth year]  Subjective: Whitney Henderson is a 87 y.o. year old female who is a primary care patient of Whitney Chard, MD. The care management team was consulted for assistance with disease management and care coordination needs.    Engaged with patient by telephone for follow up visit in response to provider referral for case management and/or care coordination services.   Consent to Services:  The patient was given information about Chronic Care Management services, agreed to services, and gave verbal consent prior to initiation of services.  Please see initial visit note for detailed documentation.   Patient agreed to services and verbal consent obtained.   Assessment: Review of patient past medical history, allergies, medications, health status, including review of consultants reports, laboratory and other test data, was performed as part of comprehensive evaluation and provision of chronic care management services.   SDOH (Social Determinants of Health) assessments and interventions performed:  Yes, no acute challenges   CCM Care Plan  Allergies  Allergen Reactions   Codeine Rash    Outpatient Encounter Medications as of 08/02/2020  Medication Sig Note   acetaminophen (TYLENOL) 500 MG tablet Take 500 mg by mouth every 6 (six) hours as needed for mild pain or moderate pain.    amLODipine (NORVASC) 2.5 MG tablet TAKE 1 TABLET(2.5 MG) BY MOUTH TWICE DAILY (Patient taking differently: Take 2.5 mg by mouth 2 (two) times daily.)    Apoaequorin (PREVAGEN PO) Take 1 capsule by mouth daily at 6 (six) AM.    ASPIRIN 81 PO Take 81 mg by mouth as needed (for pain or headache).     B Complex Vitamins (B COMPLEX PO) Take 1 capsule by mouth every other day.    betamethasone dipropionate (DIPROLENE) 0.05 % cream APPLY THIN LAYER EXTERNALLY TO THE AFFECTED AREA EVERY DAY AS NEEDED    Calcium  Carb-Cholecalciferol (CALCIUM 500 +D PO) Take 1 tablet by mouth every other day.    Multiple Vitamins-Minerals (CENTRUM SILVER PO) Take 1 tablet by mouth every other day.    Multiple Vitamins-Minerals (PRESERVISION AREDS 2 PO) Take 1 tablet by mouth 2 (two) times daily. 04/25/2020: Confirmed taking BID and Centrum   Propylene Glycol (SYSTANE BALANCE OP) Place 1 drop into both eyes daily as needed (for itchy eyes).    sertraline (ZOLOFT) 25 MG tablet TAKE 1 TABLET(25 MG) BY MOUTH DAILY (Patient taking differently: Take 25 mg by mouth daily.)    Theanine 100 MG CAPS Take 1 capsule by mouth daily as needed (sleep).    vitamin C (ASCORBIC ACID) 500 MG tablet Take 500 mg by mouth every other day.    cholecalciferol (VITAMIN D) 1000 units tablet Take 1,000 Units by mouth daily.    donepezil (ARICEPT) 5 MG tablet TAKE 1 TABLET(5 MG) BY MOUTH AT BEDTIME (Patient not taking: Reported on 08/02/2020)    No facility-administered encounter medications on file as of 08/02/2020.    Patient Active Problem List   Diagnosis Date Noted   Tremor 01/06/2020   Age-related osteoporosis with current pathological fracture 11/05/2018   Grief reaction 03/31/2018   Hypertensive nephropathy 03/03/2018   Chronic renal disease, stage III ([place]) 12/11/2017   Hypertension, essential, benign 10/30/2017   Anxiety 10/11/2017   Memory disorder 09/04/2016   Headache 04/27/2015   Hypertension 01/16/2013   UTI (lower urinary tract infection) 01/16/2013    Conditions to be addressed/monitored: HTN, Chronic Renal  Disease stage III, Hypertensive Nephropathy, osteoporosis  Care Plan : Chronic Kidney (Adult)  Updates made by Lynne Logan, RN since 08/02/2020 12:00 AM     Problem: Disease Progression   Priority: High     Long-Range Goal: Disease Progression Prevented or Minimized   Start Date: 01/24/2020  Expected End Date: 01/23/2021  Recent Progress: On track  Priority: High  Note:   Objective:  No results found for:  HGBA1C Lab Results  Component Value Date   CREATININE 1.45 (H) 06/29/2020   CREATININE 1.41 (H) 04/25/2020   CREATININE 1.35 (H) 02/28/2020   Lab Results  Component Value Date   EGFR 34 (L) 06/29/2020  Current Barriers:  Ineffective Self Health Maintenance Impaired memory  Currently UNABLE TO independently self manage needs related to chronic health conditions.  Knowledge Deficits related to short term plan for care coordination needs and long term plans for chronic disease management needs Nurse Case Manager Clinical Goal(s):  Patient will work with care management team to address care coordination and chronic disease management needs related to Disease Management Educational Needs Care Coordination Medication Management and Education Psychosocial Support   Interventions:  08/02/20 completed successful call with patient and daughter Whitney Henderson  Collaboration with Whitney Chard, MD regarding development and update of comprehensive plan of care as evidenced by provider attestation and co-signature Inter-disciplinary care team collaboration (see longitudinal plan of care) Provided education to patient about basic disease process for Chronic Kidney disease Review of patient status, including review of consultant's reports, relevant laboratory and other test results, and medications completed. Reviewed medications with patient and discussed importance of medication adherence Reviewed scheduled/upcoming provider appointments including: next PCP follow up appointment scheduled for 01/01/21 @10 :30 AM  Educated patient, providing rationale for importance of increasing water intake to 64 oz daily unless otherwise directed  Mailed printed educational materials related to management of Chronic Kidney disease   Discussed plans with patient for ongoing care management follow up and provided patient with direct contact information for care management team Patient Self Care Activities:  Continue to  adhere to MD recommendations for CKD  Continue to keep all scheduled follow up appointments Take medications as directed  Let your healthcare team know if you are unable to take your medications Call your pharmacy for refills at least 7 days prior to running out of medication Review mailed printed educational materials related to kidney disease Patient Goals:  - increase water intake to 64 oz daily   Follow Up Plan: Telephone follow up appointment with care management team member scheduled for: 01/03/21    Care Plan : Hypertension (Adult)  Updates made by Lynne Logan, RN since 08/02/2020 12:00 AM     Problem: Hypertension (Hypertension)   Priority: High     Long-Range Goal: Hypertension Monitored   Start Date: 01/24/2020  Expected End Date: 01/23/2021  Recent Progress: On track  Priority: High  Note:   Objective:  Last practice recorded BP readings:  BP Readings from Last 3 Encounters:  06/29/20 132/60  06/29/20 132/60  04/25/20 (!) 199/74   Most recent eGFR/CrCl:  Lab Results  Component Value Date   EGFR 34 (L) 06/29/2020    No components found for: CRCL Current Barriers:  Knowledge Deficits related to basic understanding of hypertension pathophysiology and self care management Knowledge Deficits related to understanding of medications prescribed for management of hypertension Cognitive Deficits Case Manager Clinical Goal(s):  patient will verbalize understanding of plan for hypertension management patient will demonstrate improved adherence  to prescribed treatment plan for hypertension as evidenced by taking all medications as prescribed, monitoring and recording blood pressure as directed, adhering to low sodium/DASH diet Interventions:  08/02/20 completed successful outbound call with patient and daughter Whitney Henderson Collaboration with Whitney Chard, MD regarding development and update of comprehensive plan of care as evidenced by provider attestation and  co-signature Inter-disciplinary care team collaboration (see longitudinal plan of care) Provided education to patient about basic disease process for Hypertension  Review of patient status, including review of consultant's reports, relevant laboratory and other test results, and medications completed. Reviewed medications with patient and discussed importance of medication adherence Educated on target BP <130/80; Educated patient on dietary and exercise recommendations Mailed printed educational materials related management of Hypertension  Educated on importance of Self monitoring BP; discussed patient is self monitoring daily and recording her readings Discussed plans with patient for ongoing care management follow up and provided patient with direct contact information for care management team Patient Goals/Self-Care Activities - check blood pressure 3 times per week - write blood pressure results in a log or diary   Follow Up Plan: Telephone follow up appointment with care management team member scheduled for: 01/03/21    Plan:Telephone follow up appointment with care management team member scheduled for:  01/03/21  Barb Merino, RN, BSN, CCM Care Management Coordinator [place] [facility]  Direct Phone: [phone]

## 2020-08-15 NOTE — Patient Instructions (Signed)
Goals Addressed      Chronic Kidney disease progression prevented or minimized   On track    Timeframe:  Long-Range Goal Priority:  High Start Date:  01/24/20                            Expected End Date: 01/23/21                    Follow Up Date: 01/03/21   Patient Self Care Activities:  Continue to adhere to MD recommendations for CKD  Continue to keep all scheduled follow up appointments Take medications as directed  Let your healthcare team know if you are unable to take your medications Call your pharmacy for refills at least 7 days prior to running out of medication Review mailed printed educational materials related to kidney disease Patient Goals:  - increase water intake to 64 oz daily   Why is this important?   A healthy diet is important for mental and physical health.  Healthy food helps repair damaged body tissue and maintains strong bones and muscles.  No single food is just right so eating a variety of proteins, fruits, vegetables and grains is best.  You may need to change what you eat or drink to manage kidney disease.  A dietitian is the best person to guide you.     Notes:      Track and Manage My Blood Pressure-Hypertension   On track    Timeframe:  Long-Range Goal Priority:  High Start Date:   01/24/20                          Expected End Date: 01/23/21                     Follow Up Date: 01/03/21 - check blood pressure 3 times per week - write blood pressure results in a log or diary  - change positions slowly when standing and or changing positions  Why is this important?   You won't feel high blood pressure, but it can still hurt your blood vessels.  High blood pressure can cause heart or kidney problems. It can also cause a stroke.  Making lifestyle changes like losing a Whitney Henderson weight or eating less salt will help.  Checking your blood pressure at home and at different times of the day can help to control blood pressure.  If the doctor prescribes  medicine remember to take it the way the doctor ordered.  Call the office if you cannot afford the medicine or if there are questions about it.     Notes:

## 2020-08-15 NOTE — Patient Instructions (Signed)
Goals Addressed      Medication Adherence maintained   On track    Timeframe:  Long-Range Goal Priority:  Medium Start Date:  08/03/20                           Expected End Date:  08/03/21       Next follow up date: 01/03/21  Self Care Activities:  Self administers medications as prescribed Attends all scheduled provider appointments Calls pharmacy for medication refills Calls provider office for new concerns or questions Patient Goals: - Medication Adherence Maintained

## 2020-08-15 NOTE — Chronic Care Management (AMB) (Signed)
Chronic Care Management   CCM RN Visit Note  08/03/2020 Name: Whitney Henderson MRN: [id number] DOB: 1928-08-15  Subjective: Whitney Henderson is a 87 y.o. year old female who is a primary care patient of [name], MD. The care management team was consulted for assistance with disease management and care coordination needs.    Engaged with patient by telephone for follow up visit in response to provider referral for case management and/or care coordination services.   Consent to Services:  The patient was given information about Chronic Care Management services, agreed to services, and gave verbal consent prior to initiation of services.  Please see initial visit note for detailed documentation.   Patient agreed to services and verbal consent obtained.   Assessment: Review of patient past medical history, allergies, medications, health status, including review of consultants reports, laboratory and other test data, was performed as part of comprehensive evaluation and provision of chronic care management services.   SDOH (Social Determinants of Health) assessments and interventions performed:    CCM Care Plan  Allergies  Allergen Reactions   Codeine Rash    Outpatient Encounter Medications as of 08/03/2020  Medication Sig Note   acetaminophen (TYLENOL) 500 MG tablet Take 500 mg by mouth every 6 (six) hours as needed for mild pain or moderate pain.    amLODipine (NORVASC) 2.5 MG tablet TAKE 1 TABLET(2.5 MG) BY MOUTH TWICE DAILY (Patient taking differently: Take 2.5 mg by mouth 2 (two) times daily.)    Apoaequorin (PREVAGEN PO) Take 1 capsule by mouth daily at 6 (six) AM.    ASPIRIN 81 PO Take 81 mg by mouth as needed (for pain or headache).     B Complex Vitamins (B COMPLEX PO) Take 1 capsule by mouth every other day.    betamethasone dipropionate (DIPROLENE) 0.05 % cream APPLY THIN LAYER EXTERNALLY TO THE AFFECTED AREA EVERY DAY AS NEEDED    Calcium Carb-Cholecalciferol (CALCIUM  500 +D PO) Take 1 tablet by mouth every other day.    cholecalciferol (VITAMIN D) 1000 units tablet Take 1,000 Units by mouth daily.    donepezil (ARICEPT) 5 MG tablet TAKE 1 TABLET(5 MG) BY MOUTH AT BEDTIME (Patient not taking: Reported on 08/02/2020)    Multiple Vitamins-Minerals (CENTRUM SILVER PO) Take 1 tablet by mouth every other day.    Multiple Vitamins-Minerals (PRESERVISION AREDS 2 PO) Take 1 tablet by mouth 2 (two) times daily. 04/25/2020: Confirmed taking BID and Centrum   Propylene Glycol (SYSTANE BALANCE OP) Place 1 drop into both eyes daily as needed (for itchy eyes).    sertraline (ZOLOFT) 25 MG tablet TAKE 1 TABLET(25 MG) BY MOUTH DAILY (Patient taking differently: Take 25 mg by mouth daily.)    Theanine 100 MG CAPS Take 1 capsule by mouth daily as needed (sleep).    vitamin C (ASCORBIC ACID) 500 MG tablet Take 500 mg by mouth every other day.    No facility-administered encounter medications on file as of 08/03/2020.    Patient Active Problem List   Diagnosis Date Noted   Tremor 01/06/2020   Age-related osteoporosis with current pathological fracture 11/05/2018   Grief reaction 03/31/2018   Hypertensive nephropathy 03/03/2018   Chronic renal disease, stage III ([place]) 12/11/2017   Hypertension, essential, benign 10/30/2017   Anxiety 10/11/2017   Memory disorder 09/04/2016   Headache 04/27/2015   Hypertension 01/16/2013   UTI (lower urinary tract infection) 01/16/2013    Conditions to be addressed/monitored: HTN, Chronic Renal Disease stage III, Hypertensive  Nephropathy, osteoporosis  Care Plan : Wellness (Adult)  Updates made by Lynne Logan, RN since 08/03/2020 12:00 AM     Problem: Medication Adherence (Wellness)   Priority: Medium     Long-Range Goal: Medication Adherence Maintained   Start Date: 08/03/2020  Expected End Date: 08/03/2021  Priority: Medium  Note:   Current Barriers:  Ineffective Self Health Maintenance in a patient with HTN, Chronic Renal  Disease stage III, Hypertensive Nephropathy, osteoporosis Memory Loss  Clinical Goal(s):  Collaboration with [name], MD regarding development and update of comprehensive plan of care as evidenced by provider attestation and co-signature Inter-disciplinary care team collaboration (see longitudinal plan of care) patient will work with care management team to address care coordination and chronic disease management needs related to Disease Management Educational Needs Care Coordination Medication Management and Education Psychosocial Support   Interventions:  08/03/20 completed successful inbound call from daughter [name]  Evaluation of current treatment plan related to HTN, Chronic Renal Disease stage III, Hypertensive Nephropathy, osteoporosis, self-management and patient's adherence to plan as established by provider. Collaboration with [name], MD regarding development and update of comprehensive plan of care as evidenced by provider attestation       and co-signature Inter-disciplinary care team collaboration (see longitudinal plan of care) Medication reconciliation completed with daughter Assessed for adherence and or barriers to prevent adherence Determined daughter ensures patients has OTC medications and reminds the patient to take her medications as prescribed Determined patient uses a pill box and continues to self administer her medications  Discussed plans with patient for ongoing care management follow up and provided patient with direct contact information for care management team Self Care Activities:  Self administers medications as prescribed Attends all scheduled provider appointments Calls pharmacy for medication refills Calls provider office for new concerns or questions Patient Goals: - Medication Adherence Maintained   Follow Up Plan: Telephone follow up appointment with care management team member scheduled for: 01/03/21     Plan:Telephone follow up  appointment with care management team member scheduled for:  01/03/21  Barb Merino, RN, BSN, CCM Care Management Coordinator [place] [facility]  Direct Phone: [phone]

## 2020-08-21 ENCOUNTER — Telehealth: Payer: Self-pay

## 2020-08-21 NOTE — Chronic Care Management (AMB) (Signed)
   Patient's daughter aware of telephone appointment with [name] CPP on 08-22-2020 at 3:00. Patient aware to have/bring all medications, supplements, blood pressure and/or blood sugar logs to visit.  Questions: Have you had any recent office visit or specialist visit outside of [place]? Patient's daughter stated no  Are there any concerns you would like to discuss during your office visit? Patient's daughter stated no. Patient's daughter would like to do appointment on a 3 way call.  Are you having any problems obtaining your medications? (Whether it pharmacy issues or cost)? Patient's daughter stated no.  If patient has any PAP medications ask if they are having any problems getting their PAP medication or refill? None  Care Gaps: Tdap overdue Medicare wellness 07-19-2021 RAF= 0.952 %    Star Rating Drug: None   Any gaps in medications fill history? No  [place] Pharmacist Assistant [phone]

## 2020-08-22 ENCOUNTER — Ambulatory Visit (INDEPENDENT_AMBULATORY_CARE_PROVIDER_SITE_OTHER): Payer: Medicare Other

## 2020-08-22 DIAGNOSIS — F419 Anxiety disorder, unspecified: Secondary | ICD-10-CM

## 2020-08-22 DIAGNOSIS — I10 Essential (primary) hypertension: Secondary | ICD-10-CM | POA: Diagnosis not present

## 2020-08-22 NOTE — Progress Notes (Signed)
Chronic Care Management Pharmacy Note  08/24/2020 Name:  Whitney Henderson MRN:  [id number] DOB:  Nov 14, 1928  Summary: Patient reports that she does not take her medication everyday.   Recommendations/Changes made from today's visit: Recommend patient take her medication everyday on a schedule.   Plan: Patient to start taking her medication everyday.    Subjective: Whitney Henderson is an 87 y.o. year old female who is a primary patient of [name], MD.  The CCM team was consulted for assistance with disease management and care coordination needs.    Engaged with patient by telephone for follow up visit in response to provider referral for pharmacy case management and/or care coordination services.   Consent to Services:  The patient was given information about Chronic Care Management services, agreed to services, and gave verbal consent prior to initiation of services.  Please see initial visit note for detailed documentation.   Patient Care Team: [name], MD as PCP - General (Internal Medicine) Monna Fam, MD as Consulting Physician (Ophthalmology) Lynne Logan, RN as Case Manager Mayford Knife, [facility] (Pharmacist)  Recent office visits: 06/29/2020 PCP OV  Recent consult visits: 03/16/2020 Opthalmology Exam  ED Visit: 04/25/2020    Objective:  Lab Results  Component Value Date   CREATININE 1.45 (H) 06/29/2020   BUN 25 06/29/2020   GFRNONAA 35 (L) 04/25/2020   GFRAA 40 (L) 02/28/2020   NA 141 06/29/2020   K 4.6 06/29/2020   CALCIUM 9.3 06/29/2020   CO2 24 06/29/2020   GLUCOSE 90 06/29/2020    Lab Results  Component Value Date/Time   MICROALBUR 150 02/28/2020 12:49 PM   MICROALBUR 10 02/18/2019 01:02 PM    Last diabetic Eye exam:  Lab Results  Component Value Date/Time   HMDIABEYEEXA Retinopathy (A) 12/15/2017 12:00 AM    Last diabetic Foot exam: No results found for: HMDIABFOOTEX   Lab Results  Component Value Date   CHOL 238 (H)  02/28/2020   HDL 94 02/28/2020   LDLCALC 131 (H) 02/28/2020   TRIG 77 02/28/2020   CHOLHDL 2.5 02/28/2020    Hepatic Function Latest Ref Rng & Units 04/25/2020 02/28/2020 05/19/2019  Total Protein 6.5 - 8.1 g/dL 6.8 7.8 7.2  Albumin 3.5 - 5.0 g/dL 3.4(L) 4.5 -  AST 15 - 41 U/L 21 23 -  ALT 0 - 44 U/L 12 9 -  Alk Phosphatase 38 - 126 U/L 54 75 -  Total Bilirubin 0.3 - 1.2 mg/dL 0.7 <0.2 -  Bilirubin, Direct 0.0 - 0.2 mg/dL <0.1 - -    Lab Results  Component Value Date/Time   TSH 2.550 12/02/2019 11:07 AM   TSH 2.620 11/05/2018 10:59 AM   FREET4 1.36 11/05/2018 10:59 AM   FREET4 1.35 10/30/2017 12:43 PM    CBC Latest Ref Rng & Units 04/25/2020 12/02/2019 02/18/2019  WBC 4.0 - 10.5 K/uL 4.0 4.4 4.6  Hemoglobin 12.0 - 15.0 g/dL 11.2(L) 11.7 12.4  Hematocrit 36.0 - 46.0 % 34.9(L) 36.6 39.6  Platelets 150 - 400 K/uL 180 193 213    Lab Results  Component Value Date/Time   VD25OH 58.9 02/28/2020 04:48 PM    Clinical ASCVD: No  The ASCVD Risk score Mikey Bussing DC Jr., et al., 2013) failed to calculate for the following reasons:   The 2013 ASCVD risk score is only valid for ages 42 to 3    Depression screen PHQ 2/9 06/29/2020 12/02/2019 05/19/2019  Decreased Interest 0 1 0  Down, Depressed,  Hopeless 0 2 1  PHQ - 2 Score 0 3 1  Altered sleeping - 0 0  Tired, decreased energy - 3 1  Change in appetite - 0 0  Feeling bad or failure about yourself  - 0 0  Trouble concentrating - 0 0  Moving slowly or fidgety/restless - 0 0  Suicidal thoughts - 0 0  PHQ-9 Score - 6 2  Difficult doing work/chores - Not difficult at all Not difficult at all     Social History   Tobacco Use  Smoking Status Former   Years: 2.00   Types: Cigarettes  Smokeless Tobacco Never  Tobacco Comments   smoked sometimes not daily   BP Readings from Last 3 Encounters:  06/29/20 132/60  06/29/20 132/60  04/25/20 (!) 199/74   Pulse Readings from Last 3 Encounters:  06/29/20 93  04/25/20 (!) 50  02/28/20 64    Wt Readings from Last 3 Encounters:  06/29/20 119 lb (54 kg)  06/29/20 119 lb (54 kg)  02/28/20 121 lb (54.9 kg)   BMI Readings from Last 3 Encounters:  06/29/20 23.55 kg/m  06/29/20 23.55 kg/m  02/28/20 23.95 kg/m    Assessment/Interventions: Review of patient past medical history, allergies, medications, health status, including review of consultants reports, laboratory and other test data, was performed as part of comprehensive evaluation and provision of chronic care management services.   SDOH:  (Social Determinants of Health) assessments and interventions performed: No  SDOH Screenings   Alcohol Screen: Not on file  Depression (PHQ2-9): Low Risk    PHQ-2 Score: 0  Financial Resource Strain: Low Risk    Difficulty of Paying Living Expenses: Not hard at all  Food Insecurity: No Food Insecurity   Worried About Charity fundraiser in the Last Year: Never true   Ran Out of Food in the Last Year: Never true  Housing: Low Risk    Last Housing Risk Score: 0  Physical Activity: Inactive   Days of Exercise per Week: 0 days   Minutes of Exercise per Session: 0 min  Social Connections: Not on file  Stress: Stress Concern Present   Feeling of Stress : To some extent  Tobacco Use: Medium Risk   Smoking Tobacco Use: Former   Smokeless Tobacco Use: Never  Transportation Needs: No Data processing manager (Medical): No   Lack of Transportation (Non-Medical): No    CCM Care Plan  Allergies  Allergen Reactions   Codeine Rash    Medications Reviewed Today     Reviewed by Mayford Knife, RPH (Pharmacist) on 08/22/20 at 1511  Med List Status: <None>   Medication Order Taking? Sig Documenting Provider Last Dose Status Informant  acetaminophen (TYLENOL) 500 MG tablet [id number] Yes Take 500 mg by mouth every 6 (six) hours as needed for mild pain or moderate pain. [provider] Taking Active Child  amLODipine (NORVASC) 2.5 MG tablet  [id number] Yes TAKE 1 TABLET(2.5 MG) BY MOUTH TWICE DAILY  Patient taking differently: Take 2.5 mg by mouth 2 (two) times daily.   [name], MD Taking Active   Apoaequorin [facility] PO) [id number] Yes Take 1 capsule by mouth daily at 6 (six) AM. [provider] Taking Active Child  ASPIRIN 81 PO [id number] No Take 81 mg by mouth as needed (for pain or headache).   Patient not taking: Reported on 08/22/2020   [provider] Not Taking Active Child  Med Note Ebony Hail, AMBER B   Sun Feb 22, 2018  2:53 PM)    B Complex Vitamins (B COMPLEX PO) [id number] Yes Take 1 capsule by mouth every other day. [provider] Taking Active Child  betamethasone dipropionate (DIPROLENE) 0.05 % cream [id number] Yes APPLY THIN LAYER EXTERNALLY TO THE AFFECTED AREA EVERY DAY AS NEEDED Minette Brine, FNP Taking Active   Calcium Carb-Cholecalciferol (CALCIUM 500 +D PO) [id number] Yes Take 1 tablet by mouth every other day. [provider] Taking Active Child  cholecalciferol (VITAMIN D) 1000 units tablet [id number]  Take 1,000 Units by mouth daily. [provider]  Active            Med Note Ebony Hail, AMBER B   Sun Feb 22, 2018  2:48 PM)    donepezil (ARICEPT) 5 MG tablet [id number] No TAKE 1 TABLET(5 MG) BY MOUTH AT BEDTIME  Patient not taking: No sig reported   Suzzanne Cloud, NP Not Taking Active   Multiple Vitamins-Minerals (CENTRUM SILVER PO) [id number] Yes Take 1 tablet by mouth every other day. [provider] Taking Active Child  Multiple Vitamins-Minerals (PRESERVISION AREDS 2 PO) [id number] Yes Take 1 tablet by mouth 2 (two) times daily. [provider] Taking Active Child           Med Note Alfonse Spruce, ANH T   Tue Apr 25, 2020  5:27 PM) Confirmed taking BID and Centrum  Propylene Glycol (SYSTANE BALANCE OP) [id number]  Place 1 drop into both eyes daily as needed (for itchy eyes). [provider]  Active Child  sertraline (ZOLOFT) 25 MG  tablet [id number] Yes TAKE 1 TABLET(25 MG) BY MOUTH DAILY  Patient taking differently: Take 25 mg by mouth daily.   [name], MD Taking Active   Theanine 100 MG CAPS [id number] Yes Take 1 capsule by mouth daily as needed (sleep). [provider] Taking Active   vitamin C (ASCORBIC ACID) 500 MG tablet [id number] Yes Take 500 mg by mouth every other day. [provider] Taking Active Child            Patient Active Problem List   Diagnosis Date Noted   Tremor 01/06/2020   Age-related osteoporosis with current pathological fracture 11/05/2018   Grief reaction 03/31/2018   Hypertensive nephropathy 03/03/2018   Chronic renal disease, stage III ([place]) 12/11/2017   Hypertension, essential, benign 10/30/2017   Anxiety 10/11/2017   Memory disorder 09/04/2016   Headache 04/27/2015   Hypertension 01/16/2013   UTI (lower urinary tract infection) 01/16/2013    Immunization History  Administered Date(s) Administered   Fluad Quad(high Dose 65+) 09/25/2018   Influenza, High Dose Seasonal PF 10/02/2016, 10/14/2019   Influenza-Unspecified 10/22/2017   PFIZER(Purple Top)SARS-COV-2 Vaccination 02/09/2019, 03/01/2019, 12/09/2019, 06/20/2020   Pneumococcal Conjugate-13 11/28/2015   Pneumococcal Polysaccharide-23 12/04/2016   Zoster Recombinat (Shingrix) 09/25/2018, 12/04/2018    Conditions to be addressed/monitored:  Hypertension and Anxiety  Care Plan : [place]  Updates made by Mayford Knife, [place] since 08/24/2020 12:00 AM     Problem: HTN, Anxiety   Priority: High     Long-Range Goal: Disease Management   Recent Progress: Not on track  Priority: High  Note:   Current Barriers:  Unable to independently monitor therapeutic efficacy  Pharmacist Clinical Goal(s):  Patient will achieve adherence to monitoring guidelines and medication adherence to achieve therapeutic efficacy through collaboration with PharmD and provider.   Interventions: 1:1  collaboration with [name], MD regarding development and update  of comprehensive plan of care as evidenced by provider attestation and co-signature Inter-disciplinary care team collaboration (see longitudinal plan of care) Comprehensive medication review performed; medication list updated in electronic medical record  Hypertension  (Status:Goal on track: YES.)   Med Management Intervention: Increase adherence  (BP goal <130/80) -Controlled -Current treatment: Amlodipine 2.5 mg tablet twice per day.   -Current home readings: Patient is checking hr BP at home, she reports that they fluctuate.  -Current dietary habits: patient reports that she is doing pretty good. She is still mourning the passing of her husband from 2 years ago. Breakfast - a boost, Lunch: chicken salad, Dinner : [place] burger and spinach -Current exercise habits:she is walking and being more active.  -Denies hypotensive/hypertensive symptoms -She is going to try a wrist cuff from [facility].  -Educated on Importance of home blood pressure monitoring; Proper BP monitoring technique; -Counseled to monitor BP at home at least three days per week, document, and provide log at future appointments -Recommended to continue current medication  Anxiety (Goal: Reduce signs and symptoms of anxiety) -Controlled -Current treatment: Sertraline 25 mg tablet once per day.  -Medications previously tried/failed: none noted  -GAD7: 8 -Patient reports that she is not taking her L-Theanine -Educated on Benefits of medication for symptom control -Recommended to continue current medication   Health Maintenance -Vaccine gaps: TDAP, Flu    - Patient ready for TDAP vaccine -Current therapy:  L-Theanine 100 mg capsule once per day  Vitamin C 500 mg tablet every other day  AREDS- take 1 tablet by mouth two times per day  Centrum Silver taking 1 tablet by mouth every other day   -Educated on Cost vs benefit of each product must be  carefully weighed by individual consumer -Patient is satisfied with current therapy and denies issues -Recommended to continue current medication  Patient Goals/Self-Care Activities Patient will:  - take medications as prescribed  Follow Up Plan: The patient has been provided with contact information for the care management team and has been advised to call with any health related questions or concerns.       Medication Assistance: None required.  Patient affirms current coverage meets needs.  Compliance/Adherence/Medication fill history: Care Gaps: TDAP Influenza Vaccine    Patient's preferred pharmacy is:  [facility] [place], [address] [address] Phone: [phone] Fax: [phone]  Uses pill box? Yes , a pill reminder system Pt endorses 90% compliance  We discussed: Benefits of medication synchronization, packaging and delivery as well as enhanced pharmacist oversight with [employer]. Patient decided to: Continue current medication management strategy  Care Plan and Follow Up Patient Decision:  Patient agrees to Care Plan and Follow-up.  Plan: The patient has been provided with contact information for the care management team and has been advised to call with any health related questions or concerns.   [name], PharmD Clinical Pharmacist [facility] [phone]

## 2020-08-23 ENCOUNTER — Telehealth: Payer: Medicare Other

## 2020-08-24 NOTE — Patient Instructions (Signed)
Visit Information It was great speaking with you today!  Please let me know if you have any questions about our visit.   Goals Addressed             This Visit's Progress    Manage My Medicine       Timeframe:  Long-Range Goal Priority:  High Start Date:                             Expected End Date:                       Follow Up Date 12/26/2020  In Progress:   - call for medicine refill 2 or 3 days before it runs out - keep a list of all the medicines I take; vitamins and herbals too - use a pillbox to sort medicine - use an alarm clock or phone to remind me to take my medicine    Why is this important?   These steps will help you keep on track with your medicines.         Patient Care Plan: CCM Pharmacy Care Plan     Problem Identified: HTN, Anxiety   Priority: High     Long-Range Goal: Disease Management   Recent Progress: Not on track  Priority: High  Note:   Current Barriers:  Unable to independently monitor therapeutic efficacy  Pharmacist Clinical Goal(s):  Patient will achieve adherence to monitoring guidelines and medication adherence to achieve therapeutic efficacy through collaboration with PharmD and provider.   Interventions: 1:1 collaboration with [name], MD regarding development and update of comprehensive plan of care as evidenced by provider attestation and co-signature Inter-disciplinary care team collaboration (see longitudinal plan of care) Comprehensive medication review performed; medication list updated in electronic medical record  Hypertension  (Status:Goal on track: YES.)   Med Management Intervention: Increase adherence  (BP goal <130/80) -Controlled -Current treatment: Amlodipine 2.5 mg tablet twice per day.   -Current home readings: Patient is checking hr BP at home, she reports that they fluctuate.  -Current dietary habits: patient reports that she is doing pretty good. She is still mourning the passing of her  husband from 2 years ago. Breakfast - a boost, Lunch: chicken salad, Dinner : [place] burger and spinach -Current exercise habits:she is walking and being more active.  -Denies hypotensive/hypertensive symptoms -She is going to try a wrist cuff from [facility].  -Educated on Importance of home blood pressure monitoring; Proper BP monitoring technique; -Counseled to monitor BP at home at least three days per week, document, and provide log at future appointments -Recommended to continue current medication  Anxiety (Goal: Reduce signs and symptoms of anxiety) -Controlled -Current treatment: Sertraline 25 mg tablet once per day.  -Medications previously tried/failed: none noted  -GAD7: 8 -Patient reports that she is not taking her L-Theanine -Educated on Benefits of medication for symptom control -Recommended to continue current medication   Health Maintenance -Vaccine gaps: TDAP, Flu    - Patient ready for TDAP vaccine -Current therapy:  L-Theanine 100 mg capsule once per day  Vitamin C 500 mg tablet every other day  AREDS- take 1 tablet by mouth two times per day  Centrum Silver taking 1 tablet by mouth every other day   -Educated on Cost vs benefit of each product must be carefully weighed by individual consumer -Patient is satisfied with current therapy and denies issues -Recommended to  continue current medication  Patient Goals/Self-Care Activities Patient will:  - take medications as prescribed  Follow Up Plan: The patient has been provided with contact information for the care management team and has been advised to call with any health related questions or concerns.        Patient agreed to services and verbal consent obtained.   The patient verbalized understanding of instructions, educational materials, and care plan provided today and agreed to receive a mailed copy of patient instructions, educational materials, and care plan.   [name], PharmD Clinical  Pharmacist [facility] [phone]

## 2020-09-07 ENCOUNTER — Ambulatory Visit: Payer: Medicare Other | Admitting: Neurology

## 2020-09-07 ENCOUNTER — Encounter: Payer: Self-pay | Admitting: Neurology

## 2020-09-07 VITALS — BP 175/80 | HR 61 | Ht 61.0 in | Wt 117.0 lb

## 2020-09-07 DIAGNOSIS — R413 Other amnesia: Secondary | ICD-10-CM | POA: Diagnosis not present

## 2020-09-07 DIAGNOSIS — R251 Tremor, unspecified: Secondary | ICD-10-CM | POA: Diagnosis not present

## 2020-09-07 NOTE — Progress Notes (Signed)
Reason for visit: Memory disorder, resting tremor  Whitney Henderson is an 87 y.o. female  History of present illness:  Whitney Henderson is a 87 year old right-handed black female with a history of a mild memory disturbance.  The patient also has history of intermittent headaches that have not been extremely frequent for her.  She was seen in the emergency room on 25 April 2020 with a headache and some chest discomfort, her blood pressures were greater than [id number] systolic at that time.  The patient comes in today after missing her morning dose of blood pressure medication, her blood pressures are elevated today.  She currently lives alone, but her daughter helps her frequently.  She is not operating a motor vehicle.  The daughter will cook for her and then she will heat up leftovers.  She relies on her daughter to help her with the finances and keep up with appointments.  She denies any significant problems of balance or any falls.  She has had some mild weight loss over the last 8 months, she has dropped 4 pounds since December 2021.  She is on Aricept taking 5 mg at night.  She denies any diarrhea or vivid dreams on the medication.  She does report some mild changes in memory over time.  Past Medical History:  Diagnosis Date   Anxiety    Cataracts, bilateral    Discoid lupus    Headache 04/27/2015   Left>right periorbital   Hypertension    Memory difficulties 09/04/2016    Past Surgical History:  Procedure Laterality Date   ABDOMINAL HYSTERECTOMY     BREAST EXCISIONAL BIOPSY Left over 89 ys ago   benign   CHOLECYSTECTOMY     EXCISIONAL HEMORRHOIDECTOMY      Family History  Problem Relation Age of Onset   Early death Mother    Cancer Father    Breast cancer Daughter        45s    Social history:  reports that she has quit smoking. Her smoking use included cigarettes. She has never used smokeless tobacco. She reports that she does not drink alcohol and does not use drugs.    Allergies   Allergen Reactions   Codeine Rash    Medications:  Prior to Admission medications   Medication Sig Start Date End Date Taking? Authorizing Provider  acetaminophen (TYLENOL) 500 MG tablet Take 500 mg by mouth every 6 (six) hours as needed for mild pain or moderate pain.    [provider]  amLODipine (NORVASC) 2.5 MG tablet TAKE 1 TABLET(2.5 MG) BY MOUTH TWICE DAILY Patient taking differently: Take 2.5 mg by mouth 2 (two) times daily. 03/28/20   [name], MD  Apoaequorin (PREVAGEN PO) Take 1 capsule by mouth daily at 6 (six) AM.    [provider]  ASPIRIN 81 PO Take 81 mg by mouth as needed (for pain or headache).  Patient not taking: Reported on 08/22/2020    [provider]  B Complex Vitamins (B COMPLEX PO) Take 1 capsule by mouth every other day.    [provider]  betamethasone dipropionate (DIPROLENE) 0.05 % cream APPLY THIN LAYER EXTERNALLY TO THE AFFECTED AREA EVERY DAY AS NEEDED 05/25/18   Minette Brine, FNP  Calcium Carb-Cholecalciferol (CALCIUM 500 +D PO) Take 1 tablet by mouth every other day.    [provider]  cholecalciferol (VITAMIN D) 1000 units tablet Take 1,000 Units by mouth daily.    [provider]  donepezil (  ARICEPT) 5 MG tablet TAKE 1 TABLET(5 MG) BY MOUTH AT BEDTIME Patient not taking: No sig reported 01/06/20   Suzzanne Cloud, NP  Multiple Vitamins-Minerals (CENTRUM SILVER PO) Take 1 tablet by mouth every other day.    [provider]  Multiple Vitamins-Minerals (PRESERVISION AREDS 2 PO) Take 1 tablet by mouth 2 (two) times daily.    [provider]  Propylene Glycol (SYSTANE BALANCE OP) Place 1 drop into both eyes daily as needed (for itchy eyes).    [provider]  sertraline (ZOLOFT) 25 MG tablet TAKE 1 TABLET(25 MG) BY MOUTH DAILY Patient taking differently: Take 25 mg by mouth daily. 04/17/20   [name], MD  Theanine 100 MG CAPS Take 1 capsule by mouth daily as needed  (sleep).    [provider]  vitamin C (ASCORBIC ACID) 500 MG tablet Take 500 mg by mouth every other day.    [provider]    ROS:  Out of a complete 14 system review of symptoms, the patient complains only of the following symptoms, and all other reviewed systems are negative.  Memory problems Weight loss, decreased appetite Headache  Blood pressure (!) 175/80, pulse 61, height '5\' 1"'$  (1.549 m), weight 117 lb (53.1 kg).  Repeat blood pressure, right arm, sitting is 184/80.  Physical Exam  General: The patient is alert and cooperative at the time of the examination.  Skin: No significant peripheral edema is noted.   Neurologic Exam  Mental status: The patient is alert and oriented x 3 at the time of the examination. The Mini-Mental status examination done today shows a total score of 23/30.   Cranial nerves: Facial symmetry is present. Speech is normal, no aphasia or dysarthria is noted. Extraocular movements are full. Visual fields are full.  Motor: The patient has good strength in all 4 extremities.  Sensory examination: Soft touch sensation is symmetric on the face, arms, and legs.  Coordination: The patient has good finger-nose-finger and heel-to-shin bilaterally.  At times, a mild resting tremors noted with the right upper extremity.  Gait and station: The patient has a normal gait for age, the patient has bilateral arm swing. Tandem gait was attempted, the patient has some apraxia with the lower extremities.  Romberg is negative. No drift is seen.  Reflexes: Deep tendon reflexes are symmetric.   CT head 04/25/20:  IMPRESSION: No acute or traumatic finding. Age related volume loss. Atherosclerotic calcification of the major vessels at the base of the brain.  * CT scan images were reviewed online. I agree with the written report.    Assessment/Plan:  1.  Mild memory disorder  2.  Resting tremor  3.  Intermittent headache  The patient  is progressing slowly with her memory.  She will remain on donepezil for now but they will need to monitor her weights.  If she continues to lose weight, the Aricept will need to be discontinued.  The patient does have occasional headaches, when seen in the emergency room her systolic blood pressure was greater than 200, some of her headaches may be related to hypertension.  The patient will be monitoring her blood pressures at home in the future.  She missed her morning blood pressure medication today.  The patient does have a resting tremor, but no other signs of Parkinson's disease, this will need to be monitored.  She will follow-up here in 8 months.  Jill Alexanders MD 09/07/2020 11:01 AM  [facility] [id number] [place], [place] [address]  Phone [phone] Fax [phone]

## 2020-09-28 ENCOUNTER — Telehealth: Payer: Self-pay

## 2020-09-28 DIAGNOSIS — H40013 Open angle with borderline findings, low risk, bilateral: Secondary | ICD-10-CM | POA: Diagnosis not present

## 2020-09-28 NOTE — Chronic Care Management (AMB) (Signed)
Chronic Care Management Pharmacy Assistant   Name: Whitney Henderson  MRN: [id number] DOB: November 09, 1928  Reason for Encounter: Disease State/ Hypertension  Recent office visits:  None  Recent consult visits:  09-07-2020 [name], MD (Neurology)  Hospital visits:  Medication Reconciliation was completed by comparing discharge summary, patient's EMR and Pharmacy list, and upon discussion with patient.   Admitted to the hospital on 04-25-2020 due to bad headache. Discharge date was 04-25-2020. Discharged from [place]?Medications Started at [facility] Discharge:?? None   Medication Changes at Hospital Discharge: None   Medications Discontinued at Hospital Discharge: None   Medications that remain the same after Hospital Discharge:??  -All other medications will remain the same  Medications: Outpatient Encounter Medications as of 09/28/2020  Medication Sig   acetaminophen (TYLENOL) 500 MG tablet Take 500 mg by mouth every 6 (six) hours as needed for mild pain or moderate pain.   amLODipine (NORVASC) 2.5 MG tablet TAKE 1 TABLET(2.5 MG) BY MOUTH TWICE DAILY (Patient taking differently: Take 2.5 mg by mouth 2 (two) times daily.)   Apoaequorin (PREVAGEN PO) Take 1 capsule by mouth daily at 6 (six) AM.   ASPIRIN 81 PO Take 81 mg by mouth as needed (for pain or headache).   B Complex Vitamins (B COMPLEX PO) Take 1 capsule by mouth every other day.   betamethasone dipropionate (DIPROLENE) 0.05 % cream APPLY THIN LAYER EXTERNALLY TO THE AFFECTED AREA EVERY DAY AS NEEDED   Calcium Carb-Cholecalciferol (CALCIUM 500 +D PO) Take 1 tablet by mouth every other day.   cholecalciferol (VITAMIN D) 1000 units tablet Take 1,000 Units by mouth daily.   donepezil (ARICEPT) 5 MG tablet TAKE 1 TABLET(5 MG) BY MOUTH AT BEDTIME   Multiple Vitamins-Minerals (CENTRUM SILVER PO) Take 1 tablet by mouth every other day.   Propylene Glycol (SYSTANE BALANCE OP) Place 1 drop into both  eyes daily as needed (for itchy eyes).   sertraline (ZOLOFT) 25 MG tablet TAKE 1 TABLET(25 MG) BY MOUTH DAILY (Patient taking differently: Take 25 mg by mouth daily.)   Theanine 100 MG CAPS Take 1 capsule by mouth daily as needed (sleep).   vitamin C (ASCORBIC ACID) 500 MG tablet Take 500 mg by mouth every other day.   No facility-administered encounter medications on file as of 09/28/2020.   Reviewed chart prior to disease state call. Spoke with patient regarding BP  Recent Office Vitals: BP Readings from Last 3 Encounters:  09/07/20 (!) 175/80  06/29/20 132/60  06/29/20 132/60   Pulse Readings from Last 3 Encounters:  09/07/20 61  06/29/20 93  04/25/20 (!) 50    Wt Readings from Last 3 Encounters:  09/07/20 117 lb (53.1 kg)  06/29/20 119 lb (54 kg)  06/29/20 119 lb (54 kg)     Kidney Function Lab Results  Component Value Date/Time   CREATININE 1.45 (H) 06/29/2020 12:44 PM   CREATININE 1.41 (H) 04/25/2020 01:42 PM   GFRNONAA 35 (L) 04/25/2020 01:42 PM   GFRAA 40 (L) 02/28/2020 04:48 PM    BMP Latest Ref Rng & Units 06/29/2020 04/25/2020 02/28/2020  Glucose 65 - 99 mg/dL 90 97 83  BUN 10 - 36 mg/dL 25 21 28(H)  Creatinine 0.57 - 1.00 mg/dL 1.45(H) 1.41(H) 1.35(H)  BUN/Creat Ratio 12 - 28 17 - 21  Sodium 134 - 144 mmol/L 141 137 139  Potassium 3.5 - 5.2 mmol/L 4.6 4.1 4.6  Chloride 96 - 106 mmol/L 103  105 102  CO2 20 - 29 mmol/L '24 27 25  '$ Calcium 8.7 - 10.3 mg/dL 9.3 8.9 9.7      09-28-2020: 1st attempt left VM with daughter 10-04-2020: 2nd attempt left VM with daughter 10-05-2020: 3rd attempt left VM with daughter  Care Gaps: Tdap overdue Medicare wellness 07-19-2021 RAF= 0.952 %  Star Rating Drugs: None  Jeannette How [place] Clinical Pharmacist Assistant [phone]

## 2020-10-06 ENCOUNTER — Other Ambulatory Visit: Payer: Self-pay | Admitting: Internal Medicine

## 2020-10-12 ENCOUNTER — Other Ambulatory Visit: Payer: Self-pay | Admitting: Internal Medicine

## 2020-10-12 DIAGNOSIS — I10 Essential (primary) hypertension: Secondary | ICD-10-CM

## 2020-10-31 ENCOUNTER — Telehealth: Payer: Self-pay

## 2020-10-31 NOTE — Chronic Care Management (AMB) (Signed)
Chronic Care Management Pharmacy Assistant   Name: Whitney Henderson  MRN: [id number] DOB: Feb 25, 1928  Reason for Encounter: Disease State/ Hypertension    Recent office visits:  None  Recent consult visits:  09-07-2020 Whitney Ducking, MD (Neurology). STOP preservision. START multivitamin every other day.  Hospital visits:  None in previous 6 months  Medications: Outpatient Encounter Medications as of 10/31/2020  Medication Sig   acetaminophen (TYLENOL) 500 MG tablet Take 500 mg by mouth every 6 (six) hours as needed for mild pain or moderate pain.   amLODipine (NORVASC) 2.5 MG tablet TAKE 1 TABLET(2.5 MG) BY MOUTH TWICE DAILY   Apoaequorin (PREVAGEN PO) Take 1 capsule by mouth daily at 6 (six) AM.   ASPIRIN 81 PO Take 81 mg by mouth as needed (for pain or headache).   B Complex Vitamins (B COMPLEX PO) Take 1 capsule by mouth every other day.   betamethasone dipropionate (DIPROLENE) 0.05 % cream APPLY THIN LAYER EXTERNALLY TO THE AFFECTED AREA EVERY DAY AS NEEDED   Calcium Carb-Cholecalciferol (CALCIUM 500 +D PO) Take 1 tablet by mouth every other day.   cholecalciferol (VITAMIN D) 1000 units tablet Take 1,000 Units by mouth daily.   donepezil (ARICEPT) 5 MG tablet TAKE 1 TABLET(5 MG) BY MOUTH AT BEDTIME   Multiple Vitamins-Minerals (CENTRUM SILVER PO) Take 1 tablet by mouth every other day.   Propylene Glycol (SYSTANE BALANCE OP) Place 1 drop into both eyes daily as needed (for itchy eyes).   sertraline (ZOLOFT) 25 MG tablet TAKE 1 TABLET(25 MG) BY MOUTH DAILY   Theanine 100 MG CAPS Take 1 capsule by mouth daily as needed (sleep).   vitamin C (ASCORBIC ACID) 500 MG tablet Take 500 mg by mouth every other day.   No facility-administered encounter medications on file as of 10/31/2020.   Reviewed chart prior to disease state call. Spoke with patient regarding BP  Recent Office Vitals: BP Readings from Last 3 Encounters:  09/07/20 (!) 175/80  06/29/20 132/60  06/29/20  132/60   Pulse Readings from Last 3 Encounters:  09/07/20 61  06/29/20 93  04/25/20 (!) 50    Wt Readings from Last 3 Encounters:  09/07/20 117 lb (53.1 kg)  06/29/20 119 lb (54 kg)  06/29/20 119 lb (54 kg)     Kidney Function Lab Results  Component Value Date/Time   CREATININE 1.45 (H) 06/29/2020 12:44 PM   CREATININE 1.41 (H) 04/25/2020 01:42 PM   GFRNONAA 35 (L) 04/25/2020 01:42 PM   GFRAA 40 (L) 02/28/2020 04:48 PM    BMP Latest Ref Rng & Units 06/29/2020 04/25/2020 02/28/2020  Glucose 65 - 99 mg/dL 90 97 83  BUN 10 - 36 mg/dL 25 21 28(H)  Creatinine 0.57 - 1.00 mg/dL 1.45(H) 1.41(H) 1.35(H)  BUN/Creat Ratio 12 - 28 17 - 21  Sodium 134 - 144 mmol/L 141 137 139  Potassium 3.5 - 5.2 mmol/L 4.6 4.1 4.6  Chloride 96 - 106 mmol/L 103 105 102  CO2 20 - 29 mmol/L 24 27 25   Calcium 8.7 - 10.3 mg/dL 9.3 8.9 9.7    Current antihypertensive regimen:  Amlodipine 2.5 mg twice daily  [place] often are you checking your Blood Pressure? daily  Current home BP readings: 113/60 ,103/70, 138/80   What recent interventions/DTPs have been made by any provider to improve Blood Pressure control since last CPP Visit:  Educated on Importance of home blood pressure monitoring; Proper BP monitoring technique; Counseled to monitor BP at home at  least three days per week, document, and provide log at future appointments  Any recent hospitalizations or ED visits since last visit with CPP? No  What diet changes have been made to improve Blood Pressure Control?  Patient states she has limited her salt intake, eats fruits/ vegetables and drinks water daily.  What exercise is being done to improve your Blood Pressure Control?  Patient states she walks daily outside and at the [facility].  Adherence Review: Is the patient currently on ACE/ARB medication? No Does the patient have >5 day gap between last estimated fill dates? No   Care Gaps: Tdap overdue Medicare wellness 07-19-2021  Star Rating  Drugs: None  Whitney Henderson [facility] Clinical Pharmacist Assistant [phone]

## 2020-11-23 ENCOUNTER — Ambulatory Visit (INDEPENDENT_AMBULATORY_CARE_PROVIDER_SITE_OTHER): Payer: Medicare Other

## 2020-11-23 ENCOUNTER — Telehealth: Payer: Medicare Other

## 2020-11-23 DIAGNOSIS — N183 Chronic kidney disease, stage 3 unspecified: Secondary | ICD-10-CM

## 2020-11-23 DIAGNOSIS — I10 Essential (primary) hypertension: Secondary | ICD-10-CM

## 2020-11-23 DIAGNOSIS — M810 Age-related osteoporosis without current pathological fracture: Secondary | ICD-10-CM

## 2020-11-23 DIAGNOSIS — I129 Hypertensive chronic kidney disease with stage 1 through stage 4 chronic kidney disease, or unspecified chronic kidney disease: Secondary | ICD-10-CM

## 2020-11-24 NOTE — Patient Instructions (Signed)
Visit Information   PATIENT GOALS/PLAN OF CARE: Care Plan : RN Care Manager Plan of Care  Updates made by Lynne Logan, RN since 11/23/2020 12:00 AM     Problem: No Plan of care established for management of chronic disease states (HTN, Chronic Renal Disease stage III, Hypertensive Nephropathy, osteoporosis)   Priority: High     Long-Range Goal: Development of plan of care for chroinc disease management for HTN, Chronic Renal Disease stage III, Hypertensive Nephropathy, osteoporosis   Start Date: 11/23/2020  Expected End Date: 11/23/2021  This Visit's Progress: On track  Priority: High  Note:   Current Barriers:  Knowledge Deficits related to plan of care for management of HTN, Chronic Renal Disease stage III, Hypertensive Nephropathy, osteoporosis Chronic Disease Management support and education needs related to HTN, Chronic Renal Disease stage III, Hypertensive Nephropathy, osteoporosis  RNCM Clinical Goal(s):  Patient will verbalize basic understanding of  HTN, Chronic Renal Disease stage III, Hypertensive Nephropathy, osteoporosis disease process and self health management plan   demonstrate Ongoing health management independence   continue to work with RN Care Manager to address care management and care coordination needs related to  HTN, Chronic Renal Disease stage III, Hypertensive Nephropathy, osteoporosis will demonstrate ongoing self health care management ability    through collaboration with RN Care manager, provider, and care team.   Interventions: 1:1 collaboration with primary care provider regarding development and update of comprehensive plan of care as evidenced by provider attestation and co-signature Inter-disciplinary care team collaboration (see longitudinal plan of care) Evaluation of current treatment plan related to  self management and patient's adherence to plan as established by provider  Hypertension Interventions: Last practice recorded BP readings:  BP  Readings from Last 3 Encounters:  09/07/20 (!) 175/80  06/29/20 132/60  06/29/20 132/60  Most recent eGFR/CrCl:  Lab Results  Component Value Date   EGFR 34 (L) 06/29/2020    No components found for: CRCL Completed inbound call with daughter [name] and patient  Evaluation of current treatment plan related to hypertension self management and patient's adherence to plan as established by provider; Reviewed medications with patient and discussed importance of compliance; Counseled on the importance of exercise goals with target of 150 minutes per week Discussed plans with patient for ongoing care management follow up and provided patient with direct contact information for care management team; Advised patient, providing education and rationale, to monitor blood pressure daily and record, calling PCP for findings outside established parameters;  Provided education on prescribed diet low Sodium;  Assessed social determinant of health barriers;    Chronic Kidney Disease Interventions:  (Status:  Goal on track:  Yes Completed inbound call with daughter [name] and patient Discussed patient concerns related to recent urinary frequency with nocturia and incontinence, symptoms started 2 weeks ago Assessed for other symptoms suggestive of UTI and or overactive bladder, patient denies having any other symptoms Educated on symptoms suggestive of UTI, advised a urine sample is needed in order to diagnose/treat; Educated on potential complications if left untreated Added patient to PCP schedule with Minette Brine FNP for 11/29/20 _0 :68 PM for evaluation of symptoms  Evaluation of current treatment plan related to chronic kidney disease self management and patient's adherence to plan as established by provider      Reviewed prescribed diet increase water intake as discussed Counseled on the importance of exercise goals with target of 150 minutes per week     Advised patient, providing education and rationale,  to  monitor blood pressure daily and record, calling PCP for findings outside established parameters    Discussed complications of poorly controlled blood pressure such as heart disease, stroke, circulatory complications, vision complications, kidney impairment, sexual dysfunction    Discussed plans with patient for ongoing care management follow up and provided patient with direct contact information for care management team    Provided education on kidney disease progression    Last practice recorded BP readings:  BP Readings from Last 3 Encounters:  09/07/20 (!) 175/80  06/29/20 132/60  06/29/20 132/60  Most recent eGFR/CrCl:  Lab Results  Component Value Date   EGFR 34 (L) 06/29/2020    No components found for: CRCL  Patient Goals/Self-Care Activities: Patient will self administer medications as prescribed Patient will attend all scheduled provider appointments Patient will call pharmacy for medication refills Patient will attend church or other social activities Patient will continue to perform ADL's independently Patient will continue to perform IADL's independently Patient will call provider office for new concerns or questions  Follow Up Plan:  Telephone follow up appointment with care management team member scheduled for:  01/03/21      Consent to CCM Services: Ms. Headings was given information about Chronic Care Management services including:  CCM service includes personalized support from designated clinical staff supervised by her physician, including individualized plan of care and coordination with other care providers 24/7 contact phone numbers for assistance for urgent and routine care needs. Service will only be billed when office clinical staff spend 20 minutes or more in a month to coordinate care. Only one practitioner may furnish and bill the service in a calendar month. The patient may stop CCM services at any time (effective at the end of the month) by phone  call to the office staff. The patient will be responsible for cost sharing (co-pay) of up to 20% of the service fee (after annual deductible is met).  Patient agreed to services and verbal consent obtained.   The patient verbalized understanding of instructions, educational materials, and care plan provided today and declined offer to receive copy of patient instructions, educational materials, and care plan.   Telephone follow up appointment with care management team member scheduled for: 01/03/21  Barb Merino, RN, BSN, CCM Care Management Coordinator [place] [facility]  Direct Phone: [phone]

## 2020-11-24 NOTE — Chronic Care Management (AMB) (Signed)
Chronic Care Management   CCM RN Visit Note  11/23/2020 Name: Whitney Henderson MRN: [id number] DOB: 07/09/1928  Subjective: Whitney Henderson is a 87 y.o. year old female who is a primary care patient of [name], MD. The care management team was consulted for assistance with disease management and care coordination needs.    Engaged with patient by telephone for initial visit in response to provider referral for case management and/or care coordination services.   Consent to Services:  The patient was given information about Chronic Care Management services, agreed to services, and gave verbal consent prior to initiation of services.  Please see initial visit note for detailed documentation.   Patient agreed to services and verbal consent obtained.   Assessment: Review of patient past medical history, allergies, medications, health status, including review of consultants reports, laboratory and other test data, was performed as part of comprehensive evaluation and provision of chronic care management services.   SDOH (Social Determinants of Health) assessments and interventions performed:  Yes, no acute challenges  CCM Care Plan  Allergies  Allergen Reactions   Codeine Rash    Outpatient Encounter Medications as of 11/23/2020  Medication Sig   acetaminophen (TYLENOL) 500 MG tablet Take 500 mg by mouth every 6 (six) hours as needed for mild pain or moderate pain.   amLODipine (NORVASC) 2.5 MG tablet TAKE 1 TABLET(2.5 MG) BY MOUTH TWICE DAILY   Apoaequorin (PREVAGEN PO) Take 1 capsule by mouth daily at 6 (six) AM.   ASPIRIN 81 PO Take 81 mg by mouth as needed (for pain or headache).   B Complex Vitamins (B COMPLEX PO) Take 1 capsule by mouth every other day.   betamethasone dipropionate (DIPROLENE) 0.05 % cream APPLY THIN LAYER EXTERNALLY TO THE AFFECTED AREA EVERY DAY AS NEEDED   Calcium Carb-Cholecalciferol (CALCIUM 500 +D PO) Take 1 tablet by mouth every other day.    cholecalciferol (VITAMIN D) 1000 units tablet Take 1,000 Units by mouth daily.   donepezil (ARICEPT) 5 MG tablet TAKE 1 TABLET(5 MG) BY MOUTH AT BEDTIME   Multiple Vitamins-Minerals (CENTRUM SILVER PO) Take 1 tablet by mouth every other day.   Propylene Glycol (SYSTANE BALANCE OP) Place 1 drop into both eyes daily as needed (for itchy eyes).   sertraline (ZOLOFT) 25 MG tablet TAKE 1 TABLET(25 MG) BY MOUTH DAILY   Theanine 100 MG CAPS Take 1 capsule by mouth daily as needed (sleep).   vitamin C (ASCORBIC ACID) 500 MG tablet Take 500 mg by mouth every other day.   No facility-administered encounter medications on file as of 11/23/2020.    Patient Active Problem List   Diagnosis Date Noted   Tremor 01/06/2020   Age-related osteoporosis with current pathological fracture 11/05/2018   Grief reaction 03/31/2018   Hypertensive nephropathy 03/03/2018   Chronic renal disease, stage III ([place]) 12/11/2017   Hypertension, essential, benign 10/30/2017   Anxiety 10/11/2017   Memory disorder 09/04/2016   Headache 04/27/2015   Hypertension 01/16/2013   UTI (lower urinary tract infection) 01/16/2013    Conditions to be addressed/monitored:HTN, Chronic Renal Disease stage III, Hypertensive Nephropathy, osteoporosis  Care Plan : RN Care Manager Plan of Care  Updates made by Lynne Logan, RN since 11/23/2020 12:00 AM     Problem: No Plan of care established for management of chronic disease states (HTN, Chronic Renal Disease stage III, Hypertensive Nephropathy, osteoporosis)   Priority: High     Long-Range Goal: Development of plan of care for  chroinc disease management for HTN, Chronic Renal Disease stage III, Hypertensive Nephropathy, osteoporosis   Start Date: 11/23/2020  Expected End Date: 11/23/2021  This Visit's Progress: On track  Priority: High  Note:   Current Barriers:  Knowledge Deficits related to plan of care for management of HTN, Chronic Renal Disease stage III, Hypertensive  Nephropathy, osteoporosis Chronic Disease Management support and education needs related to HTN, Chronic Renal Disease stage III, Hypertensive Nephropathy, osteoporosis  RNCM Clinical Goal(s):  Patient will verbalize basic understanding of  HTN, Chronic Renal Disease stage III, Hypertensive Nephropathy, osteoporosis disease process and self health management plan   demonstrate Ongoing health management independence   continue to work with RN Care Manager to address care management and care coordination needs related to  HTN, Chronic Renal Disease stage III, Hypertensive Nephropathy, osteoporosis will demonstrate ongoing self health care management ability    through collaboration with RN Care manager, provider, and care team.   Interventions: 1:1 collaboration with primary care provider regarding development and update of comprehensive plan of care as evidenced by provider attestation and co-signature Inter-disciplinary care team collaboration (see longitudinal plan of care) Evaluation of current treatment plan related to  self management and patient's adherence to plan as established by provider  Hypertension Interventions: Last practice recorded BP readings:  BP Readings from Last 3 Encounters:  09/07/20 (!) 175/80  06/29/20 132/60  06/29/20 132/60  Most recent eGFR/CrCl:  Lab Results  Component Value Date   EGFR 34 (L) 06/29/2020    No components found for: CRCL Completed inbound call with daughter [name] and patient  Evaluation of current treatment plan related to hypertension self management and patient's adherence to plan as established by provider; Reviewed medications with patient and discussed importance of compliance; Counseled on the importance of exercise goals with target of 150 minutes per week Discussed plans with patient for ongoing care management follow up and provided patient with direct contact information for care management team; Advised patient, providing education  and rationale, to monitor blood pressure daily and record, calling PCP for findings outside established parameters;  Provided education on prescribed diet low Sodium;  Assessed social determinant of health barriers;    Chronic Kidney Disease Interventions:  (Status:  Goal on track:  Yes Completed inbound call with daughter [name] and patient Discussed patient concerns related to recent urinary frequency with nocturia and incontinence, symptoms started 2 weeks ago Assessed for other symptoms suggestive of UTI and or overactive bladder, patient denies having any other symptoms Educated on symptoms suggestive of UTI, advised a urine sample is needed in order to diagnose/treat; Educated on potential complications if left untreated Added patient to PCP schedule with Minette Brine FNP for 11/29/20 @3 :46 PM for evaluation of symptoms  Evaluation of current treatment plan related to chronic kidney disease self management and patient's adherence to plan as established by provider      Reviewed prescribed diet increase water intake as discussed Counseled on the importance of exercise goals with target of 150 minutes per week     Advised patient, providing education and rationale, to monitor blood pressure daily and record, calling PCP for findings outside established parameters    Discussed complications of poorly controlled blood pressure such as heart disease, stroke, circulatory complications, vision complications, kidney impairment, sexual dysfunction    Discussed plans with patient for ongoing care management follow up and provided patient with direct contact information for care management team    Provided education on kidney disease progression  Last practice recorded BP readings:  BP Readings from Last 3 Encounters:  09/07/20 (!) 175/80  06/29/20 132/60  06/29/20 132/60  Most recent eGFR/CrCl:  Lab Results  Component Value Date   EGFR 34 (L) 06/29/2020    No components found for:  CRCL  Patient Goals/Self-Care Activities: Patient will self administer medications as prescribed Patient will attend all scheduled provider appointments Patient will call pharmacy for medication refills Patient will attend church or other social activities Patient will continue to perform ADL's independently Patient will continue to perform IADL's independently Patient will call provider office for new concerns or questions  Follow Up Plan:  Telephone follow up appointment with care management team member scheduled for:  01/03/21     Plan:Telephone follow up appointment with care management team member scheduled for:  01/03/21  Barb Merino, RN, BSN, CCM Care Management Coordinator [place] [facility]  Direct Phone: [phone]

## 2020-11-29 ENCOUNTER — Other Ambulatory Visit: Payer: Self-pay

## 2020-11-29 ENCOUNTER — Encounter: Payer: Self-pay | Admitting: Nurse Practitioner

## 2020-11-29 ENCOUNTER — Ambulatory Visit (INDEPENDENT_AMBULATORY_CARE_PROVIDER_SITE_OTHER): Payer: Medicare Other | Admitting: Nurse Practitioner

## 2020-11-29 VITALS — BP 112/70 | HR 73 | Temp 98.4°F | Ht 61.0 in | Wt 117.0 lb

## 2020-11-29 DIAGNOSIS — R350 Frequency of micturition: Secondary | ICD-10-CM | POA: Diagnosis not present

## 2020-11-29 DIAGNOSIS — N390 Urinary tract infection, site not specified: Secondary | ICD-10-CM | POA: Diagnosis not present

## 2020-11-29 NOTE — Patient Instructions (Signed)
Urinary Frequency, Adult Urinary frequency means urinating more often than usual. You may urinate every 1-2 hours even though you drink a normal amount of fluid and do not have a bladder infection or condition. Although you urinate more often than normal, the total amount of urine produced in a day is normal. With urinary frequency, you may have an urgent need to urinate often. The stress and anxiety of needing to find a bathroom quickly can make this urge worse. This condition may go away on its own, or you may need treatment at home. Home treatment may include bladder training, exercises, taking medicines, or making changes to your diet. Follow these instructions at home: Bladder health Your health care provider will tell you what to do to improve bladder health. You may be told to: Keep a bladder diary. Keep track of: What you eat and drink. How often you urinate. How much you urinate. Follow a bladder training program. This may include: Learning to delay going to the bathroom. Double urinating, also called voiding. This helps if you are not completely emptying your bladder. Scheduled voiding. Do Kegel exercises. Kegel exercises strengthen the muscles that help control urination, which may help the condition.  Eating and drinking Follow instructions from your health care provider about eating or drinking restrictions. You may be told to: Avoid caffeine. Drink fewer fluids, especially alcohol. Avoid drinking in the evening. Avoid foods or drinks that may irritate the bladder. These include coffee, tea, soda, artificial sweeteners, citrus, tomato-based foods, and chocolate. Eat foods that help prevent or treat constipation. Constipation can make urinary frequency worse. You may need to take these actions to prevent or treat constipation: Drink enough fluid to keep your urine pale yellow. Take over-the-counter or prescription medicines. Eat foods that are high in fiber, such as beans, whole  grains, and fresh fruits and vegetables. Limit foods that are high in fat and processed sugars, such as fried or sweet foods. General instructions Take over-the-counter and prescription medicines only as told by your health care provider. Keep all follow-up visits. This is important. Contact a health care provider if: You start urinating more often. You feel pain or irritation when you urinate. You notice blood in your urine. Your urine looks cloudy. You develop a fever. You begin vomiting. Get help right away if: You are unable to urinate. Summary Urinary frequency means urinating more often than usual. With urinary frequency, you may urinate every 1-2 hours even though you drink a normal amount of fluid and do not have a bladder infection or other bladder condition. Your health care provider may recommend that you keep a bladder diary, follow a bladder training program, or make dietary changes. If told by your health care provider, do Kegel exercises to strengthen the muscles that help control urination. Take over-the-counter and prescription medicines only as told by your health care provider. Contact a health care provider if your symptoms do not improve or get worse. This information is not intended to replace advice given to you by your health care provider. Make sure you discuss any questions you have with your health care provider. Document Revised: 08/13/2019 Document Reviewed: 08/13/2019 Elsevier Patient Education  [place].

## 2020-11-29 NOTE — Progress Notes (Signed)
I,Katawbba Wiggins,acting as a Education administrator for [employer], FNP.,have documented all relevant documentation on the behalf of Minette Brine, FNP,as directed by  Minette Brine, FNP while in the presence of Minette Brine, [place].   This visit occurred during the SARS-CoV-2 public health emergency.  Safety protocols were in place, including screening questions prior to the visit, additional usage of staff PPE, and extensive cleaning of exam room while observing appropriate contact time as indicated for disinfecting solutions.  Subjective:     Patient ID: Whitney Henderson , female    DOB: [birth year] , 87 y.o.   MRN: [id number]   Chief Complaint  Patient presents with   Urinary Frequency    HPI  The patient is here today for frequency of urination. She has been having urinary incontinence in the bed for the last few months. She is not able to hold her urine when trying to urinate. Denies burning sensation. She is drinking about 56 oz water a day.  She is wearing depends in the last few months.   Urinary Frequency  This is a chronic problem. She is Not sexually active. There is No history of pyelonephritis. Associated symptoms include frequency.    Past Medical History:  Diagnosis Date   Anxiety    Cataracts, bilateral    Discoid lupus    Headache 04/27/2015   Left>right periorbital   Hypertension    Memory difficulties 09/04/2016     Family History  Problem Relation Age of Onset   Early death Mother    Cancer Father    Breast cancer Daughter        20s     Current Outpatient Medications:    acetaminophen (TYLENOL) 500 MG tablet, Take 500 mg by mouth every 6 (six) hours as needed for mild pain or moderate pain., Disp: , Rfl:    amLODipine (NORVASC) 2.5 MG tablet, TAKE 1 TABLET(2.5 MG) BY MOUTH TWICE DAILY, Disp: 180 tablet, Rfl: 1   Apoaequorin (PREVAGEN PO), Take 1 capsule by mouth daily at 6 (six) AM., Disp: , Rfl:    ASPIRIN 81 PO, Take 81 mg by mouth as needed (for pain or headache).,  Disp: , Rfl:    B Complex Vitamins (B COMPLEX PO), Take 1 capsule by mouth every other day., Disp: , Rfl:    betamethasone dipropionate (DIPROLENE) 0.05 % cream, APPLY THIN LAYER EXTERNALLY TO THE AFFECTED AREA EVERY DAY AS NEEDED, Disp: 60 g, Rfl: 2   Calcium Carb-Cholecalciferol (CALCIUM 500 +D PO), Take 1 tablet by mouth every other day., Disp: , Rfl:    cholecalciferol (VITAMIN D) 1000 units tablet, Take 1,000 Units by mouth daily., Disp: , Rfl:    donepezil (ARICEPT) 5 MG tablet, TAKE 1 TABLET(5 MG) BY MOUTH AT BEDTIME, Disp: 30 tablet, Rfl: 5   Multiple Vitamins-Minerals (CENTRUM SILVER PO), Take 1 tablet by mouth every other day., Disp: , Rfl:    Propylene Glycol (SYSTANE BALANCE OP), Place 1 drop into both eyes daily as needed (for itchy eyes)., Disp: , Rfl:    sertraline (ZOLOFT) 25 MG tablet, TAKE 1 TABLET(25 MG) BY MOUTH DAILY, Disp: 90 tablet, Rfl: 1   Theanine 100 MG CAPS, Take 1 capsule by mouth daily as needed (sleep)., Disp: , Rfl:    vitamin C (ASCORBIC ACID) 500 MG tablet, Take 500 mg by mouth every other day., Disp: , Rfl:    Allergies  Allergen Reactions   Codeine Rash     Review of Systems  Constitutional: Negative.  Respiratory: Negative.    Cardiovascular: Negative.  Negative for chest pain, palpitations and leg swelling.  Genitourinary:  Positive for frequency.  Neurological:  Negative for dizziness and headaches.  Psychiatric/Behavioral: Negative.      Today's Vitals   11/29/20 1557  BP: 112/70  Pulse: 73  Temp: 98.4 F (36.9 C)  Weight: 117 lb (53.1 kg)  Height: 5\' 1"  (1.549 m)  PainSc: 0-No pain   Body mass index is 22.11 kg/m.  Wt Readings from Last 3 Encounters:  11/29/20 117 lb (53.1 kg)  09/07/20 117 lb (53.1 kg)  06/29/20 119 lb (54 kg)    BP Readings from Last 3 Encounters:  11/29/20 112/70  09/07/20 (!) 175/80  06/29/20 132/60    Objective:  Physical Exam Vitals reviewed.  Constitutional:      General: She is not in acute  distress.    Appearance: Normal appearance. She is well-developed.  HENT:     Head: Normocephalic and atraumatic.  Eyes:     Pupils: Pupils are equal, round, and reactive to light.  Cardiovascular:     Rate and Rhythm: Normal rate and regular rhythm.     Pulses: Normal pulses.     Heart sounds: Normal heart sounds. No murmur heard. Pulmonary:     Effort: Pulmonary effort is normal. No respiratory distress.     Breath sounds: Normal breath sounds. No wheezing.  Skin:    General: Skin is warm and dry.     Capillary Refill: Capillary refill takes less than 2 seconds.  Neurological:     General: No focal deficit present.     Mental Status: She is alert and oriented to person, place, and time.     Cranial Nerves: No cranial nerve deficit.  Psychiatric:        Mood and Affect: Mood normal.        Behavior: Behavior normal.        Thought Content: Thought content normal.        Judgment: Judgment normal.        Assessment And Plan:     1. Frequency of urination Comments: She was unable to go to the bathroom while at the office will bring sample back to the office - POCT Urinalysis Dipstick (81002)  2. Urinary tract infection without hematuria, site unspecified - nitrofurantoin, macrocrystal-monohydrate, (MACROBID) 100 MG capsule; Take 1 capsule (100 mg total) by mouth 2 (two) times daily for 5 days.  Dispense: 10 capsule; Refill: 0 - Urine Culture    Patient was given opportunity to ask questions. Patient verbalized understanding of the plan and was able to repeat key elements of the plan. All questions were answered to their satisfaction.  Minette Brine, FNP   I, Minette Brine, FNP, have reviewed all documentation for this visit. The documentation on 12/07/20 for the exam, diagnosis, procedures, and orders are all accurate and complete.   IF YOU HAVE BEEN REFERRED TO A SPECIALIST, IT MAY TAKE 1-2 WEEKS TO SCHEDULE/PROCESS THE REFERRAL. IF YOU HAVE NOT HEARD FROM US/SPECIALIST IN  TWO WEEKS, PLEASE GIVE [place] A CALL AT [phone].   THE PATIENT IS ENCOURAGED TO PRACTICE SOCIAL DISTANCING DUE TO THE COVID-19 PANDEMIC.

## 2020-11-30 ENCOUNTER — Telehealth: Payer: Self-pay | Admitting: Nurse Practitioner

## 2020-11-30 ENCOUNTER — Other Ambulatory Visit: Payer: Self-pay | Admitting: Nurse Practitioner

## 2020-11-30 DIAGNOSIS — R81 Glycosuria: Secondary | ICD-10-CM

## 2020-11-30 DIAGNOSIS — N390 Urinary tract infection, site not specified: Secondary | ICD-10-CM | POA: Diagnosis not present

## 2020-11-30 LAB — POCT URINALYSIS DIPSTICK
Bilirubin, UA: NEGATIVE
Blood, UA: NEGATIVE
Glucose, UA: POSITIVE — AB
Ketones, UA: NEGATIVE
Nitrite, UA: POSITIVE
Protein, UA: NEGATIVE
Spec Grav, UA: <=1.005 — AB (ref 1.010–1.025)
Urobilinogen, UA: 0.2 E.U./dL
pH, UA: 5 (ref 5.0–8.0)

## 2020-11-30 MED ORDER — NITROFURANTOIN MONOHYD MACRO 100 MG PO CAPS: 100.0000 mg | ORAL_CAPSULE | Freq: Two times a day (BID) | ORAL | 0 refills | Status: AC

## 2020-11-30 NOTE — Telephone Encounter (Signed)
Called patient to make aware her urine also had glucose, she is to come in next week for a HgbA1c and BMP, order has been placed and is in the lab. She is to come on Monday around 10 am unless this is not a good time for her daughter

## 2020-12-06 LAB — URINE CULTURE

## 2020-12-07 ENCOUNTER — Encounter: Payer: Self-pay | Admitting: Nurse Practitioner

## 2020-12-11 ENCOUNTER — Other Ambulatory Visit: Payer: Self-pay | Admitting: Nurse Practitioner

## 2020-12-11 ENCOUNTER — Other Ambulatory Visit: Payer: Medicare Other

## 2020-12-11 DIAGNOSIS — R81 Glycosuria: Secondary | ICD-10-CM | POA: Diagnosis not present

## 2020-12-11 DIAGNOSIS — N390 Urinary tract infection, site not specified: Secondary | ICD-10-CM

## 2020-12-11 DIAGNOSIS — R7309 Other abnormal glucose: Secondary | ICD-10-CM | POA: Diagnosis not present

## 2020-12-12 LAB — BMP8+EGFR
BUN/Creatinine Ratio: 17 (ref 12–28)
BUN: 25 mg/dL (ref 10–36)
CO2: 23 mmol/L (ref 20–29)
Calcium: 9.4 mg/dL (ref 8.7–10.3)
Chloride: 99 mmol/L (ref 96–106)
Creatinine, Ser: 1.46 mg/dL — ABNORMAL HIGH (ref 0.57–1.00)
Glucose: 134 mg/dL — ABNORMAL HIGH (ref 70–99)
Potassium: 4.1 mmol/L (ref 3.5–5.2)
Sodium: 136 mmol/L (ref 134–144)
eGFR: 34 mL/min/{1.73_m2} — ABNORMAL LOW (ref >59)

## 2020-12-12 LAB — HEMOGLOBIN A1C
Est. average glucose Bld gHb Est-mCnc: 120 mg/dL
Hgb A1c MFr Bld: 5.8 % — ABNORMAL HIGH (ref 4.8–5.6)

## 2020-12-15 LAB — URINE CULTURE

## 2020-12-19 ENCOUNTER — Telehealth: Payer: Medicare Other

## 2020-12-19 ENCOUNTER — Telehealth: Payer: Self-pay

## 2020-12-19 NOTE — Telephone Encounter (Signed)
  Care Management   Follow Up Note   12/19/2020 Name: Whitney Henderson MRN: [id number] DOB: 1928-12-04   Referred by: [name], MD Reason for referral : Chronic Care Management (Inbound call from daughter [name])  Voice message received from daughter [name] requesting a return call.  An unsuccessful telephone outreach was attempted today. The patient was referred to the case management team for assistance with care management and care coordination.   Follow Up Plan: A HIPPA compliant phone message was left for the patient providing contact information and requesting a return call.   Barb Merino, RN, BSN, CCM Care Management Coordinator [place] [facility]  Direct Phone: [phone]

## 2020-12-20 ENCOUNTER — Emergency Department (HOSPITAL_COMMUNITY): Payer: Medicare Other

## 2020-12-20 ENCOUNTER — Ambulatory Visit: Payer: Medicare Other

## 2020-12-20 ENCOUNTER — Other Ambulatory Visit: Payer: Self-pay

## 2020-12-20 ENCOUNTER — Emergency Department (HOSPITAL_COMMUNITY)
Admission: EM | Admit: 2020-12-20 | Discharge: 2020-12-20 | Disposition: A | Discharge status: Home / Self Care | Payer: Medicare Other | Attending: Emergency Medicine | Admitting: Emergency Medicine

## 2020-12-20 ENCOUNTER — Encounter (HOSPITAL_COMMUNITY): Payer: Self-pay

## 2020-12-20 DIAGNOSIS — I672 Cerebral atherosclerosis: Secondary | ICD-10-CM | POA: Diagnosis not present

## 2020-12-20 DIAGNOSIS — Z79899 Other long term (current) drug therapy: Secondary | ICD-10-CM | POA: Insufficient documentation

## 2020-12-20 DIAGNOSIS — M25511 Pain in right shoulder: Secondary | ICD-10-CM | POA: Diagnosis not present

## 2020-12-20 DIAGNOSIS — I129 Hypertensive chronic kidney disease with stage 1 through stage 4 chronic kidney disease, or unspecified chronic kidney disease: Secondary | ICD-10-CM

## 2020-12-20 DIAGNOSIS — R413 Other amnesia: Secondary | ICD-10-CM | POA: Diagnosis not present

## 2020-12-20 DIAGNOSIS — K5730 Diverticulosis of large intestine without perforation or abscess without bleeding: Secondary | ICD-10-CM | POA: Diagnosis not present

## 2020-12-20 DIAGNOSIS — R0789 Other chest pain: Secondary | ICD-10-CM | POA: Insufficient documentation

## 2020-12-20 DIAGNOSIS — S3993XA Unspecified injury of pelvis, initial encounter: Secondary | ICD-10-CM | POA: Diagnosis not present

## 2020-12-20 DIAGNOSIS — I10 Essential (primary) hypertension: Secondary | ICD-10-CM

## 2020-12-20 DIAGNOSIS — I700 Atherosclerosis of aorta: Secondary | ICD-10-CM | POA: Diagnosis not present

## 2020-12-20 DIAGNOSIS — N390 Urinary tract infection, site not specified: Secondary | ICD-10-CM

## 2020-12-20 DIAGNOSIS — Z743 Need for continuous supervision: Secondary | ICD-10-CM | POA: Diagnosis not present

## 2020-12-20 DIAGNOSIS — Z7982 Long term (current) use of aspirin: Secondary | ICD-10-CM | POA: Insufficient documentation

## 2020-12-20 DIAGNOSIS — R6889 Other general symptoms and signs: Secondary | ICD-10-CM | POA: Diagnosis not present

## 2020-12-20 DIAGNOSIS — R404 Transient alteration of awareness: Secondary | ICD-10-CM | POA: Diagnosis not present

## 2020-12-20 DIAGNOSIS — S32010A Wedge compression fracture of first lumbar vertebra, initial encounter for closed fracture: Secondary | ICD-10-CM | POA: Diagnosis not present

## 2020-12-20 DIAGNOSIS — I2510 Atherosclerotic heart disease of native coronary artery without angina pectoris: Secondary | ICD-10-CM | POA: Diagnosis not present

## 2020-12-20 DIAGNOSIS — M47812 Spondylosis without myelopathy or radiculopathy, cervical region: Secondary | ICD-10-CM | POA: Diagnosis not present

## 2020-12-20 DIAGNOSIS — Z043 Encounter for examination and observation following other accident: Secondary | ICD-10-CM | POA: Diagnosis not present

## 2020-12-20 DIAGNOSIS — H579 Unspecified disorder of eye and adnexa: Secondary | ICD-10-CM | POA: Diagnosis not present

## 2020-12-20 DIAGNOSIS — S0990XA Unspecified injury of head, initial encounter: Secondary | ICD-10-CM | POA: Diagnosis not present

## 2020-12-20 DIAGNOSIS — M810 Age-related osteoporosis without current pathological fracture: Secondary | ICD-10-CM

## 2020-12-20 DIAGNOSIS — M549 Dorsalgia, unspecified: Secondary | ICD-10-CM | POA: Diagnosis not present

## 2020-12-20 DIAGNOSIS — N183 Chronic kidney disease, stage 3 unspecified: Secondary | ICD-10-CM | POA: Diagnosis not present

## 2020-12-20 DIAGNOSIS — W19XXXA Unspecified fall, initial encounter: Secondary | ICD-10-CM

## 2020-12-20 DIAGNOSIS — R296 Repeated falls: Secondary | ICD-10-CM | POA: Diagnosis present

## 2020-12-20 DIAGNOSIS — Z87891 Personal history of nicotine dependence: Secondary | ICD-10-CM | POA: Insufficient documentation

## 2020-12-20 DIAGNOSIS — Z20822 Contact with and (suspected) exposure to covid-19: Secondary | ICD-10-CM | POA: Diagnosis not present

## 2020-12-20 DIAGNOSIS — S299XXA Unspecified injury of thorax, initial encounter: Secondary | ICD-10-CM | POA: Diagnosis not present

## 2020-12-20 DIAGNOSIS — Z23 Encounter for immunization: Secondary | ICD-10-CM | POA: Insufficient documentation

## 2020-12-20 DIAGNOSIS — M25512 Pain in left shoulder: Secondary | ICD-10-CM | POA: Insufficient documentation

## 2020-12-20 DIAGNOSIS — M545 Low back pain, unspecified: Secondary | ICD-10-CM | POA: Diagnosis not present

## 2020-12-20 DIAGNOSIS — J984 Other disorders of lung: Secondary | ICD-10-CM | POA: Diagnosis not present

## 2020-12-20 DIAGNOSIS — S3991XA Unspecified injury of abdomen, initial encounter: Secondary | ICD-10-CM | POA: Diagnosis not present

## 2020-12-20 DIAGNOSIS — R531 Weakness: Secondary | ICD-10-CM | POA: Diagnosis present

## 2020-12-20 DIAGNOSIS — S0993XA Unspecified injury of face, initial encounter: Secondary | ICD-10-CM | POA: Diagnosis not present

## 2020-12-20 DIAGNOSIS — G319 Degenerative disease of nervous system, unspecified: Secondary | ICD-10-CM | POA: Diagnosis not present

## 2020-12-20 LAB — CK
Total CK: 1133 U/L — ABNORMAL HIGH (ref 38–234)
Total CK: 1047 U/L — ABNORMAL HIGH (ref 38–234)

## 2020-12-20 LAB — URINALYSIS, MICROSCOPIC (REFLEX)

## 2020-12-20 LAB — COMPREHENSIVE METABOLIC PANEL
ALT: 19 U/L (ref 0–44)
AST: 43 U/L — ABNORMAL HIGH (ref 15–41)
Albumin: 3.2 g/dL — ABNORMAL LOW (ref 3.5–5.0)
Alkaline Phosphatase: 59 U/L (ref 38–126)
Anion gap: 10 (ref 5–15)
BUN: 17 mg/dL (ref 8–23)
CO2: 24 mmol/L (ref 22–32)
Calcium: 8.9 mg/dL (ref 8.9–10.3)
Chloride: 101 mmol/L (ref 98–111)
Creatinine, Ser: 1 mg/dL (ref 0.44–1.00)
GFR, Estimated: 54 mL/min — ABNORMAL LOW (ref >60)
Glucose, Bld: 122 mg/dL — ABNORMAL HIGH (ref 70–99)
Potassium: 3.5 mmol/L (ref 3.5–5.1)
Sodium: 135 mmol/L (ref 135–145)
Total Bilirubin: 1 mg/dL (ref 0.3–1.2)
Total Protein: 7.1 g/dL (ref 6.5–8.1)

## 2020-12-20 LAB — PROTIME-INR
INR: 1.1 (ref 0.8–1.2)
Prothrombin Time: 14 seconds (ref 11.4–15.2)

## 2020-12-20 LAB — I-STAT CHEM 8, ED
BUN: 19 mg/dL (ref 8–23)
Calcium, Ion: 1.13 mmol/L — ABNORMAL LOW (ref 1.15–1.40)
Chloride: 101 mmol/L (ref 98–111)
Creatinine, Ser: 0.9 mg/dL (ref 0.44–1.00)
Glucose, Bld: 123 mg/dL — ABNORMAL HIGH (ref 70–99)
HCT: 36 % (ref 36.0–46.0)
Hemoglobin: 12.2 g/dL (ref 12.0–15.0)
Potassium: 3.4 mmol/L — ABNORMAL LOW (ref 3.5–5.1)
Sodium: 136 mmol/L (ref 135–145)
TCO2: 24 mmol/L (ref 22–32)

## 2020-12-20 LAB — CBC
HCT: 34.7 % — ABNORMAL LOW (ref 36.0–46.0)
Hemoglobin: 11.5 g/dL — ABNORMAL LOW (ref 12.0–15.0)
MCH: 28.5 pg (ref 26.0–34.0)
MCHC: 33.1 g/dL (ref 30.0–36.0)
MCV: 85.9 fL (ref 80.0–100.0)
Platelets: 166 10*3/uL (ref 150–400)
RBC: 4.04 MIL/uL (ref 3.87–5.11)
RDW: 14.2 % (ref 11.5–15.5)
WBC: 11.1 10*3/uL — ABNORMAL HIGH (ref 4.0–10.5)
nRBC: 0 % (ref 0.0–0.2)

## 2020-12-20 LAB — RESP PANEL BY RT-PCR (FLU A&B, COVID) ARPGX2
Influenza A by PCR: NEGATIVE
Influenza B by PCR: NEGATIVE
SARS Coronavirus 2 by RT PCR: NEGATIVE

## 2020-12-20 LAB — URINALYSIS, ROUTINE W REFLEX MICROSCOPIC
Bilirubin Urine: NEGATIVE
Glucose, UA: NEGATIVE mg/dL
Ketones, ur: NEGATIVE mg/dL
Nitrite: POSITIVE — AB
Protein, ur: NEGATIVE mg/dL
Specific Gravity, Urine: 1.015 (ref 1.005–1.030)
pH: 6 (ref 5.0–8.0)

## 2020-12-20 LAB — LACTIC ACID, PLASMA: Lactic Acid, Venous: 1 mmol/L (ref 0.5–1.9)

## 2020-12-20 LAB — TROPONIN I (HIGH SENSITIVITY)
Troponin I (High Sensitivity): 23 ng/L — ABNORMAL HIGH (ref <18)
Troponin I (High Sensitivity): 25 ng/L — ABNORMAL HIGH (ref <18)

## 2020-12-20 MED ORDER — AMLODIPINE BESYLATE 5 MG PO TABS
5.0000 mg | ORAL_TABLET | Freq: Once | ORAL | Status: AC
  Administered 2020-12-20: 5 mg via ORAL
  Filled 2020-12-20: qty 1

## 2020-12-20 MED ORDER — ACETAMINOPHEN 325 MG PO TABS
650.0000 mg | ORAL_TABLET | Freq: Once | ORAL | Status: AC
  Administered 2020-12-20: 650 mg via ORAL
  Filled 2020-12-20: qty 2

## 2020-12-20 MED ORDER — CEPHALEXIN 500 MG PO CAPS: 500.0000 mg | ORAL_CAPSULE | Freq: Four times a day (QID) | ORAL | 0 refills | Status: DC

## 2020-12-20 MED ORDER — LACTATED RINGERS IV SOLN: INTRAVENOUS | Status: DC

## 2020-12-20 MED ORDER — LACTATED RINGERS IV BOLUS
500.0000 mL | Freq: Once | INTRAVENOUS | Status: AC
  Administered 2020-12-20: 500 mL via INTRAVENOUS

## 2020-12-20 MED ORDER — TETANUS-DIPHTH-ACELL PERTUSSIS 5-2.5-18.5 LF-MCG/0.5 IM SUSY
0.5000 mL | PREFILLED_SYRINGE | Freq: Once | INTRAMUSCULAR | Status: AC
  Administered 2020-12-20: 0.5 mL via INTRAMUSCULAR
  Filled 2020-12-20: qty 0.5

## 2020-12-20 NOTE — ED Notes (Signed)
Pt ambulated around the bed twice.  She was a little weak at first but improved the longer she walked.  Both pt and daughter agree that they feel good about going home.

## 2020-12-20 NOTE — ED Provider Notes (Signed)
[facility] EMERGENCY DEPARTMENT Provider Note   CSN: [id number] Arrival date & time: 12/20/20  1134     History Chief Complaint  Patient presents with   Whitney Henderson is a 87 y.o. female.   Fall Pertinent negatives include no chest pain, no abdominal pain, no headaches and no shortness of breath. Patient presents after an unwitnessed fall.  History is provided by EMS as well as her daughter.  Patient's daughter states that she speaks with her mother on the phone twice a day.  Last night, she spoke with her on the phone at around 8 PM and patient seen totally normal.  This morning, patient's daughter's phone call went unanswered.  She went to her mother's home and found her on the floor in the kitchen.  There was dried blood present that appears to come from her left eyebrow area.  Patient was awake when her daughter got there.  She was able to talk to her daughter.  Patient's daughter does not feel that she was acutely confused.  EMS was called.  EMS noted some generalized weakness.  Patient was complaining of left shoulder pain.  No interventions were given.  Patient's daughter states that, prior to this fall, her last fall was 3 days ago.  At that time, she required assistance to get up off the ground.  Patient's daughter feels that, at baseline, patient is unable to get off of the ground on her own.     Past Medical History:  Diagnosis Date   Anxiety    Cataracts, bilateral    Discoid lupus    Headache 04/27/2015   Left>right periorbital   Hypertension    Memory difficulties 09/04/2016    Patient Active Problem List   Diagnosis Date Noted   Tremor 01/06/2020   Age-related osteoporosis with current pathological fracture 11/05/2018   Grief reaction 03/31/2018   Hypertensive nephropathy 03/03/2018   Chronic renal disease, stage III ([place]) 12/11/2017   Hypertension, essential, benign 10/30/2017   Anxiety 10/11/2017   Memory disorder 09/04/2016    Headache 04/27/2015   Hypertension 01/16/2013   UTI (lower urinary tract infection) 01/16/2013    Past Surgical History:  Procedure Laterality Date   ABDOMINAL HYSTERECTOMY     BREAST EXCISIONAL BIOPSY Left over 67 ys ago   benign   CHOLECYSTECTOMY     EXCISIONAL HEMORRHOIDECTOMY       OB History   No obstetric history on file.     Family History  Problem Relation Age of Onset   Early death Mother    Cancer Father    Breast cancer Daughter        67s    Social History   Tobacco Use   Smoking status: Former    Years: 2.00    Types: Cigarettes   Smokeless tobacco: Never   Tobacco comments:    smoked sometimes not daily  Vaping Use   Vaping Use: Never used  Substance Use Topics   Alcohol use: No   Drug use: No    Home Medications Prior to Admission medications   Medication Sig Start Date End Date Taking? Authorizing Provider  acetaminophen (TYLENOL) 500 MG tablet Take 500 mg by mouth every 6 (six) hours as needed for mild pain or moderate pain.   Yes [provider]  amLODipine (NORVASC) 2.5 MG tablet TAKE 1 TABLET(2.5 MG) BY MOUTH TWICE DAILY Patient taking differently: Take 2.5 mg by mouth 2 (two) times daily. 10/12/20  Yes [name], MD  Apoaequorin (PREVAGEN PO) Take 1 capsule by mouth every other day.   Yes [provider]  ASPIRIN 81 PO Take 81 mg by mouth as needed (for pain or headache).   Yes [provider]  B Complex Vitamins (B COMPLEX PO) Take 1 capsule by mouth every other day.   Yes [provider]  betamethasone dipropionate (DIPROLENE) 0.05 % cream APPLY THIN LAYER EXTERNALLY TO THE AFFECTED AREA EVERY DAY AS NEEDED Patient taking differently: 1 application daily as needed (skin irritation). 05/25/18  Yes Minette Brine, FNP  Calcium Carb-Cholecalciferol (CALCIUM 500 +D PO) Take 1 tablet by mouth every other day.   Yes [provider]  donepezil (ARICEPT) 5 MG tablet TAKE 1 TABLET(5 MG) BY MOUTH AT  BEDTIME Patient taking differently: Take 5 mg by mouth at bedtime. 01/06/20  Yes Suzzanne Cloud, NP  Multiple Vitamins-Minerals (CENTRUM SILVER PO) Take 1 tablet by mouth every other day.   Yes [provider]  Propylene Glycol (SYSTANE BALANCE OP) Place 1 drop into both eyes daily as needed (for itchy eyes).   Yes [provider]  sertraline (ZOLOFT) 25 MG tablet TAKE 1 TABLET(25 MG) BY MOUTH DAILY Patient taking differently: Take 25 mg by mouth daily. 10/08/20  Yes [name], MD  vitamin C (ASCORBIC ACID) 500 MG tablet Take 500 mg by mouth every other day.   Yes [provider]    Allergies    Codeine  Review of Systems   Review of Systems  Constitutional:  Negative for activity change, appetite change, chills, fatigue and fever.  HENT:  Negative for congestion, ear pain, rhinorrhea, sore throat and trouble swallowing.   Eyes:  Negative for photophobia, pain and visual disturbance.  Respiratory:  Negative for cough, chest tightness, shortness of breath and wheezing.   Cardiovascular:  Negative for chest pain, palpitations and leg swelling.  Gastrointestinal:  Negative for abdominal distention, abdominal pain, diarrhea, nausea and vomiting.  Genitourinary:  Negative for dysuria, flank pain, hematuria and pelvic pain.  Musculoskeletal:  Positive for arthralgias. Negative for back pain, joint swelling, myalgias and neck pain.  Skin:  Positive for wound. Negative for color change and rash.  Neurological:  Negative for dizziness, seizures, syncope, weakness, light-headedness, numbness and headaches.  Hematological:  Does not bruise/bleed easily.  All other systems reviewed and are negative.  Physical Exam Updated Vital Signs BP (!) 188/69   Pulse (!) 59   Temp 98.3 F (36.8 C) (Oral)   Resp 17   Ht 5\' 1"  (1.549 m)   Wt 53 kg   SpO2 100%   BMI 22.08 kg/m   Physical Exam Vitals and nursing note reviewed.  Constitutional:      General: She is not in  acute distress.    Appearance: She is well-developed and normal weight. She is not toxic-appearing or diaphoretic.  HENT:     Head: Normocephalic and atraumatic.     Right Ear: External ear normal.     Left Ear: External ear normal.     Nose: Nose normal.     Mouth/Throat:     Mouth: Mucous membranes are moist.     Pharynx: Oropharynx is clear.  Eyes:     General: No scleral icterus.    Extraocular Movements: Extraocular movements intact.     Conjunctiva/sclera: Conjunctivae normal.     Comments: Swelling to lateral aspect of left eyebrow.  Scab and dried blood present.  Neck:  Comments: Cervical collar in place Cardiovascular:     Heart sounds: No murmur heard. Pulmonary:     Effort: Pulmonary effort is normal. No respiratory distress.     Breath sounds: Normal breath sounds. No wheezing or rales.  Chest:     Chest wall: Tenderness (Right side anterior) present.  Abdominal:     General: Abdomen is flat.     Palpations: Abdomen is soft.     Tenderness: There is no abdominal tenderness.  Musculoskeletal:        General: Tenderness (Posterior left shoulder) present. No swelling.     Cervical back: Neck supple. No tenderness.  Skin:    General: Skin is warm and dry.     Capillary Refill: Capillary refill takes less than 2 seconds.     Coloration: Skin is not jaundiced or pale.  Neurological:     General: No focal deficit present.     Mental Status: She is alert. She is disoriented.     Cranial Nerves: No cranial nerve deficit, dysarthria or facial asymmetry.     Sensory: Sensation is intact.     Motor: Weakness (Generalized) and tremor present. No pronator drift.  Psychiatric:        Mood and Affect: Mood normal.        Speech: Speech is delayed.        Behavior: Behavior is slowed.    ED Results / Procedures / Treatments   Labs (all labs ordered are listed, but only abnormal results are displayed) Labs Reviewed  COMPREHENSIVE METABOLIC PANEL - Abnormal; Notable for  the following components:      Result Value   Glucose, Bld 122 (*)    Albumin 3.2 (*)    AST 43 (*)    GFR, Estimated 54 (*)    All other components within normal limits  CBC - Abnormal; Notable for the following components:   WBC 11.1 (*)    Hemoglobin 11.5 (*)    HCT 34.7 (*)    All other components within normal limits  CK - Abnormal; Notable for the following components:   Total CK 1,133 (*)    All other components within normal limits  CK - Abnormal; Notable for the following components:   Total CK 1,047 (*)    All other components within normal limits  I-STAT CHEM 8, ED - Abnormal; Notable for the following components:   Potassium 3.4 (*)    Glucose, Bld 123 (*)    Calcium, Ion 1.13 (*)    All other components within normal limits  TROPONIN I (HIGH SENSITIVITY) - Abnormal; Notable for the following components:   Troponin I (High Sensitivity) 23 (*)    All other components within normal limits  TROPONIN I (HIGH SENSITIVITY) - Abnormal; Notable for the following components:   Troponin I (High Sensitivity) 25 (*)    All other components within normal limits  RESP PANEL BY RT-PCR (FLU A&B, COVID) [id number]  LACTIC ACID, PLASMA  PROTIME-INR  URINALYSIS, ROUTINE W REFLEX MICROSCOPIC    EKG EKG Interpretation  Date/Time:  Wednesday December 20 2020 12:12:30 EST Ventricular Rate:  56 PR Interval:  159 QRS Duration: 115 QT Interval:  484 QTC Calculation: 468 R Axis:   -17 Text Interpretation: Sinus rhythm Ventricular premature complex Nonspecific intraventricular conduction delay Nonspecific T abnormalities, diffuse leads Confirmed by Godfrey Pick [phone]) on 12/20/2020 12:17:14 PM  Radiology CT HEAD WO CONTRAST  Result Date: 12/20/2020 CLINICAL DATA:  Head trauma, minor EXAM: CT HEAD  WITHOUT CONTRAST TECHNIQUE: Contiguous axial images were obtained from the base of the skull through the vertex without intravenous contrast. COMPARISON:  04/25/2020 FINDINGS: Brain: Generalized  atrophy. No focal abnormality affects the brainstem or cerebellum. Cerebral hemispheres show mild chronic small-vessel ischemic changes of the white matter. No mass, hemorrhage, hydrocephalus or extra-axial collection. Vascular: There is atherosclerotic calcification of the major vessels at the base of the brain. Skull: Normal Sinuses/Orbits: Clear/normal Other: None IMPRESSION: No acute intracranial finding.  Cerebral Atrophy (ICD10-G31.9). Electronically Signed   By: Nelson Chimes M.D.   On: 12/20/2020 13:18   CT CERVICAL SPINE WO CONTRAST  Result Date: 12/20/2020 CLINICAL DATA:  Trauma to the head, face and neck EXAM: CT CERVICAL SPINE WITHOUT CONTRAST TECHNIQUE: Multidetector CT imaging of the cervical spine was performed without intravenous contrast. Multiplanar CT image reconstructions were also generated. COMPARISON:  None. FINDINGS: Alignment: No malalignment. Skull base and vertebrae: No fracture or focal bone lesion. Soft tissues and spinal canal: No traumatic soft tissue finding. Disc levels: Chronic spondylosis at C5-6. No compressive narrowing of the canal or foramina. Upper chest: Minimal apical scarring. Other: None IMPRESSION: No traumatic finding.  Ordinary mild spondylosis C5-6. Electronically Signed   By: Nelson Chimes M.D.   On: 12/20/2020 13:21   CT T-SPINE NO CHARGE  Result Date: 12/20/2020 CLINICAL DATA:  Fall.  Back pain EXAM: CT THORACIC AND LUMBAR SPINE WITHOUT CONTRAST TECHNIQUE: Multidetector CT imaging of the thoracic and lumbar spine was performed without contrast. Multiplanar CT image reconstructions were also generated. COMPARISON:  Lumbar spine radiographs 03/14/2016 FINDINGS: CT THORACIC SPINE FINDINGS Alignment: Normal Vertebrae: Negative for thoracic fracture or mass. L1 fracture described below. Paraspinal and other soft tissues: Negative for paraspinous mass or fluid collection. Atherosclerotic calcification aortic arch. Disc levels: Mild thoracic disc degeneration. No  significant spinal stenosis. CT LUMBAR SPINE FINDINGS Segmentation: 5 lumbar segments. Alignment: Normal Vertebrae: Mild compression fracture of L1 is chronic and unchanged from 2018. No acute fracture Paraspinal and other soft tissues: Negative for paraspinous mass or adenopathy. Mild atherosclerotic calcification in the abdominal aorta. Disc levels: Mild lumbar disc degeneration. Bilateral facet degeneration L4-5 and L5-S1. No significant spinal or foraminal stenosis. IMPRESSION: CT THORACIC SPINE IMPRESSION Negative for thoracic fracture or stenosis CT LUMBAR SPINE IMPRESSION Chronic fracture of L1. No acute fracture lumbar spine. Electronically Signed   By: Franchot Gallo M.D.   On: 12/20/2020 13:21   CT L-SPINE NO CHARGE  Result Date: 12/20/2020 CLINICAL DATA:  Fall.  Back pain EXAM: CT THORACIC AND LUMBAR SPINE WITHOUT CONTRAST TECHNIQUE: Multidetector CT imaging of the thoracic and lumbar spine was performed without contrast. Multiplanar CT image reconstructions were also generated. COMPARISON:  Lumbar spine radiographs 03/14/2016 FINDINGS: CT THORACIC SPINE FINDINGS Alignment: Normal Vertebrae: Negative for thoracic fracture or mass. L1 fracture described below. Paraspinal and other soft tissues: Negative for paraspinous mass or fluid collection. Atherosclerotic calcification aortic arch. Disc levels: Mild thoracic disc degeneration. No significant spinal stenosis. CT LUMBAR SPINE FINDINGS Segmentation: 5 lumbar segments. Alignment: Normal Vertebrae: Mild compression fracture of L1 is chronic and unchanged from 2018. No acute fracture Paraspinal and other soft tissues: Negative for paraspinous mass or adenopathy. Mild atherosclerotic calcification in the abdominal aorta. Disc levels: Mild lumbar disc degeneration. Bilateral facet degeneration L4-5 and L5-S1. No significant spinal or foraminal stenosis. IMPRESSION: CT THORACIC SPINE IMPRESSION Negative for thoracic fracture or stenosis CT LUMBAR SPINE  IMPRESSION Chronic fracture of L1. No acute fracture lumbar spine. Electronically Signed   By:  Franchot Gallo M.D.   On: 12/20/2020 13:21   DG Shoulder Left Port  Result Date: 12/20/2020 CLINICAL DATA:  Fall EXAM: LEFT SHOULDER COMPARISON:  None. FINDINGS: There is no evidence of fracture or dislocation. Acromioclavicular joint space narrowing and subacromial osteophytes. No focal osseous lesion. Soft tissues are unremarkable. IMPRESSION: No evidence of acute fracture or dislocation. Electronically Signed   By: Keane Police D.O.   On: 12/20/2020 12:14   DG Shoulder Right Portable  Result Date: 12/20/2020 CLINICAL DATA:  Right shoulder pain after fall today. EXAM: PORTABLE RIGHT SHOULDER COMPARISON:  None. FINDINGS: There is no evidence of fracture or dislocation. There is no evidence of arthropathy or other focal bone abnormality. Soft tissues are unremarkable. IMPRESSION: Negative. Electronically Signed   By: Marijo Conception M.D.   On: 12/20/2020 12:59   CT CHEST ABDOMEN PELVIS WO CONTRAST  Result Date: 12/20/2020 CLINICAL DATA:  Fall with trauma to the chest, abdomen and pelvis. EXAM: CT CHEST, ABDOMEN AND PELVIS WITHOUT CONTRAST TECHNIQUE: Multidetector CT imaging of the chest, abdomen and pelvis was performed following the standard protocol without IV contrast. COMPARISON:  10/13/2013 FINDINGS: CT CHEST FINDINGS Cardiovascular: Heart size is normal. No pericardial effusion. Coronary artery calcification and aortic atherosclerotic calcification are visible. Mediastinum/Nodes: No mass or lymphadenopathy. Lungs/Pleura: Mild scarring at the lung apices. The lungs are otherwise clear. No pleural fluid. Musculoskeletal: No rib fracture. No thoracic vertebral body fracture. No sternal fracture CT ABDOMEN PELVIS FINDINGS Hepatobiliary: Liver parenchyma appears normal without contrast. No visible gallbladder. Pancreas: Normal Spleen: Normal Adrenals/Urinary Tract: Adrenal glands are normal. Kidneys are  normal. Bladder is normal. Stomach/Bowel: Stomach and small intestine are normal. There is diverticulosis of the colon without visible diverticulitis. Vascular/Lymphatic: Aortic atherosclerosis. No aneurysm. IVC is normal. No adenopathy. Reproductive: Apparent fluid in the vagina. Previous hysterectomy. No pelvic mass. Other: No free fluid or air. Musculoskeletal: Old appearing partial compression fracture of L1 with loss of height of 30%. IMPRESSION: No acute or traumatic finding. Coronary artery calcification. Aortic atherosclerotic calcification. No evidence of abdominal organ injury. There appears to be fluid within the vagina, etiology unknown. Aortic Atherosclerosis (ICD10-I70.0). Electronically Signed   By: Nelson Chimes M.D.   On: 12/20/2020 13:27   CT MAXILLOFACIAL WO CONTRAST  Result Date: 12/20/2020 CLINICAL DATA:  Facial trauma EXAM: CT MAXILLOFACIAL WITHOUT CONTRAST TECHNIQUE: Multidetector CT imaging of the maxillofacial structures was performed. Multiplanar CT image reconstructions were also generated. COMPARISON:  None. FINDINGS: Osseous: No evidence of facial fracture. Orbits: No evidence of orbital injury. Sinuses: Paranasal sinuses are clear. Soft tissues: Negative Limited intracranial: Brain atrophy as shown at head CT. IMPRESSION: No traumatic facial finding. Electronically Signed   By: Nelson Chimes M.D.   On: 12/20/2020 13:20    Procedures Procedures   Medications Ordered in ED Medications  lactated ringers infusion ( Intravenous New Bag/Given 12/20/20 1324)  Tdap (BOOSTRIX) injection 0.5 mL (0.5 mLs Intramuscular Given 12/20/20 1247)  acetaminophen (TYLENOL) tablet 650 mg (650 mg Oral Given 12/20/20 1247)  lactated ringers bolus 500 mL (0 mLs Intravenous Stopped 12/20/20 1430)  amLODipine (NORVASC) tablet 5 mg (5 mg Oral Given 12/20/20 1521)    ED Course  I have reviewed the triage vital signs and the nursing notes.  Pertinent labs & imaging results that were available  during my care of the patient were reviewed by me and considered in my medical decision making (see chart for details).    MDM Rules/Calculators/A&P  Patient is a 87 year old female who arrives in the ED via EMS from home, where she lives alone.  Daughters present at bedside.  Patient had an unwitnessed fall at home sometime after 8 PM last night.  She was found on her kitchen floor.  She has a hemostatic wound to her left eyebrow.  She has generalized weakness.  She is slow to respond to questions.  She is able to follow commands.  On exam, she does not appear to have any focal neurologic deficits.  She does have generalized weakness and does appear to be confused.  Broad work-up, including trauma imaging was ordered.  Patient initially endorsing left shoulder tenderness.  X-ray imaging was obtained.  She was subsequently endorsing right shoulder pain.  Additional x-ray imaging study was ordered.  During ED observation, patient denied any new pain.  Imaging studies were negative for acute injury.  Patient did, however, have an elevated CK at 1133.  Troponin was mildly elevated as well.  Hemoglobin was baseline.  Creatinine was improved from baseline.  Lactic acid was normal.  I spoke with the patient and daughter regarding results of work-up.  Patient, herself, states that she would feel more comfortable going home.  Patient's daughter states that the patient has been at her mental baseline throughout her time in the ED.  Plan will be for repeat CK, check urinalysis, and assess patient's stability when standing and walking prior to determining disposition.  Care of patient was signed out to oncoming ED provider.  Final Clinical Impression(s) / ED Diagnoses Final diagnoses:  Fall    Rx / DC Orders ED Discharge Orders     None        Godfrey Pick, MD 12/20/20 1535

## 2020-12-20 NOTE — ED Notes (Signed)
Pt incontinent in brief. Cleansed of urine, brief changed and purewick placed to obtain UA sample

## 2020-12-20 NOTE — Discharge Instructions (Addendum)
Return for any problem.  ?

## 2020-12-20 NOTE — ED Provider Notes (Signed)
Seen after prior ED provider.  Patient and patient's daughter are still adamant in her refusal of admission.  They desired DC home.  Patient is able to ambulate without difficulty.  Out of an abundance of caution, will prescribe a short course of antibiotics to cover a possible urinary tract infection.  Importance of close follow-up is repeatedly stressed with the patient and family.  Strict return precautions given and understood.  Patient and patient's family repeatedly declined further ED observation and/or admission.   Valarie Merino, MD 12/20/20 [phone]

## 2020-12-20 NOTE — ED Triage Notes (Signed)
Pt BIB [facility] for eval after being found on the floor this AM. Lives alone, family spoke w/ her at 2000 last night. Family came over and found her on kitchen floor this AM in pool of dried blood. Pt does not recall fall. Pt c/o mid thoracic back pain, worse than normal. C/o R shoulder pain. Bleeding around L eye/eyebrow and some bruising. Generalized weakness, slightly worse on L, c/o blurred vision as well. No thinners. Able to answer questions appropriately aside from year.

## 2020-12-21 ENCOUNTER — Telehealth: Payer: Self-pay

## 2020-12-21 NOTE — Telephone Encounter (Signed)
  Care Management   Follow Up Note   12/21/2020 Name: Whitney Henderson MRN: [id number] DOB: [birth year]   Referred by: [name], MD Reason for referral : Chronic Care Management (Unsuccessful call)   An unsuccessful telephone outreach was attempted today. The patient was referred to the case management team for assistance with care management and care coordination. SW left a HIPAA compliant voice message requesting a return call.  Follow Up Plan: The care management team will reach out to the patient again over the next 30 days.   Daneen Schick, BSW, CDP Social Worker, Certified Dementia Practitioner [facility] [phone]

## 2020-12-21 NOTE — Telephone Encounter (Signed)
Left pt vm after visiting the ED on 12/20/2020. Given ext 206.

## 2020-12-22 ENCOUNTER — Telehealth: Payer: Medicare Other

## 2020-12-22 ENCOUNTER — Ambulatory Visit (INDEPENDENT_AMBULATORY_CARE_PROVIDER_SITE_OTHER): Payer: Medicare Other

## 2020-12-22 DIAGNOSIS — M810 Age-related osteoporosis without current pathological fracture: Secondary | ICD-10-CM

## 2020-12-22 DIAGNOSIS — N183 Chronic kidney disease, stage 3 unspecified: Secondary | ICD-10-CM

## 2020-12-22 DIAGNOSIS — I10 Essential (primary) hypertension: Secondary | ICD-10-CM

## 2020-12-22 DIAGNOSIS — I129 Hypertensive chronic kidney disease with stage 1 through stage 4 chronic kidney disease, or unspecified chronic kidney disease: Secondary | ICD-10-CM

## 2020-12-22 NOTE — Chronic Care Management (AMB) (Signed)
Chronic Care Management   CCM RN Visit Note  12/22/2020 Name: Whitney Henderson MRN: [id number] DOB: 08/30/1928  Subjective: Whitney Henderson is a 87 y.o. year old female who is a primary care patient of [name], MD. The care management team was consulted for assistance with disease management and care coordination needs.    Engaged with patient by telephone for follow up visit in response to provider referral for case management and/or care coordination services.   Consent to Services:  The patient was given information about Chronic Care Management services, agreed to services, and gave verbal consent prior to initiation of services.  Please see initial visit note for detailed documentation.   Patient agreed to services and verbal consent obtained.   Assessment: Review of patient past medical history, allergies, medications, health status, including review of consultants reports, laboratory and other test data, was performed as part of comprehensive evaluation and provision of chronic care management services.   SDOH (Social Determinants of Health) assessments and interventions performed:    CCM Care Plan  Allergies  Allergen Reactions   Codeine Rash    Outpatient Encounter Medications as of 12/22/2020  Medication Sig   acetaminophen (TYLENOL) 500 MG tablet Take 500 mg by mouth every 6 (six) hours as needed for mild pain or moderate pain.   amLODipine (NORVASC) 2.5 MG tablet TAKE 1 TABLET(2.5 MG) BY MOUTH TWICE DAILY (Patient taking differently: Take 2.5 mg by mouth 2 (two) times daily.)   Apoaequorin (PREVAGEN PO) Take 1 capsule by mouth every other day.   ASPIRIN 81 PO Take 81 mg by mouth as needed (for pain or headache).   B Complex Vitamins (B COMPLEX PO) Take 1 capsule by mouth every other day.   betamethasone dipropionate (DIPROLENE) 0.05 % cream APPLY THIN LAYER EXTERNALLY TO THE AFFECTED AREA EVERY DAY AS NEEDED (Patient taking differently: 1 application daily as needed  (skin irritation).)   Calcium Carb-Cholecalciferol (CALCIUM 500 +D PO) Take 1 tablet by mouth every other day.   cephALEXin (KEFLEX) 500 MG capsule Take 1 capsule (500 mg total) by mouth 4 (four) times daily.   donepezil (ARICEPT) 5 MG tablet TAKE 1 TABLET(5 MG) BY MOUTH AT BEDTIME (Patient taking differently: Take 5 mg by mouth at bedtime.)   Multiple Vitamins-Minerals (CENTRUM SILVER PO) Take 1 tablet by mouth every other day.   Propylene Glycol (SYSTANE BALANCE OP) Place 1 drop into both eyes daily as needed (for itchy eyes).   sertraline (ZOLOFT) 25 MG tablet TAKE 1 TABLET(25 MG) BY MOUTH DAILY (Patient taking differently: Take 25 mg by mouth daily.)   vitamin C (ASCORBIC ACID) 500 MG tablet Take 500 mg by mouth every other day.   No facility-administered encounter medications on file as of 12/22/2020.    Patient Active Problem List   Diagnosis Date Noted   Tremor 01/06/2020   Age-related osteoporosis with current pathological fracture 11/05/2018   Grief reaction 03/31/2018   Hypertensive nephropathy 03/03/2018   Chronic renal disease, stage III ([place]) 12/11/2017   Hypertension, essential, benign 10/30/2017   Anxiety 10/11/2017   Memory disorder 09/04/2016   Headache 04/27/2015   Hypertension 01/16/2013   UTI (lower urinary tract infection) 01/16/2013    Conditions to be addressed/monitored: HTN, Chronic Renal Disease stage III, Hypertensive Nephropathy, osteoporosis  Care Plan : RN Care Manager Plan of Care  Updates made by Lynne Logan, RN since 12/22/2020 12:00 AM     Problem: No Plan of care established for management of  chronic disease states (HTN, Chronic Renal Disease stage III, Hypertensive Nephropathy, osteoporosis)   Priority: High     Long-Range Goal: Development of plan of care for chroinc disease management for HTN, Chronic Renal Disease stage III, Hypertensive Nephropathy, osteoporosis   Start Date: 11/23/2020  Expected End Date: 11/23/2021  Recent Progress: On  track  Priority: High  Note:   Current Barriers:  Knowledge Deficits related to plan of care for management of HTN, Chronic Renal Disease stage III, Hypertensive Nephropathy, osteoporosis Chronic Disease Management support and education needs related to HTN, Chronic Renal Disease stage III, Hypertensive Nephropathy, osteoporosis  RNCM Clinical Goal(s):  Patient will verbalize basic understanding of  HTN, Chronic Renal Disease stage III, Hypertensive Nephropathy, osteoporosis disease process and self health management plan   demonstrate Ongoing health management independence   continue to work with RN Care Manager to address care management and care coordination needs related to  HTN, Chronic Renal Disease stage III, Hypertensive Nephropathy, osteoporosis will demonstrate ongoing self health care management ability    through collaboration with RN Care manager, provider, and care team.   Interventions: 1:1 collaboration with primary care provider regarding development and update of comprehensive plan of care as evidenced by provider attestation and co-signature Inter-disciplinary care team collaboration (see longitudinal plan of care) Evaluation of current treatment plan related to  self management and patient's adherence to plan as established by provider  Hypertension Interventions: (Status:  Condition stable.  Not addressed this visit.) Last practice recorded BP readings:  BP Readings from Last 3 Encounters:  09/07/20 (!) 175/80  06/29/20 132/60  06/29/20 132/60  Most recent eGFR/CrCl:  Lab Results  Component Value Date   EGFR 34 (L) 06/29/2020    No components found for: CRCL Completed inbound call with daughter [name] and patient  Evaluation of current treatment plan related to hypertension self management and patient's adherence to plan as established by provider; Reviewed medications with patient and discussed importance of compliance; Counseled on the importance of exercise  goals with target of 150 minutes per week Discussed plans with patient for ongoing care management follow up and provided patient with direct contact information for care management team; Advised patient, providing education and rationale, to monitor blood pressure daily and record, calling PCP for findings outside established parameters;  Provided education on prescribed diet low Sodium;  Assessed social determinant of health barriers;    Chronic Kidney Disease Interventions:  (Status:  Condition stable.  Not addressed this visit.) Completed inbound call with daughter [name] and patient Discussed patient concerns related to recent urinary frequency with nocturia and incontinence, symptoms started 2 weeks ago Assessed for other symptoms suggestive of UTI and or overactive bladder, patient denies having any other symptoms Educated on symptoms suggestive of UTI, advised a urine sample is needed in order to diagnose/treat; Educated on potential complications if left untreated Added patient to PCP schedule with Minette Brine FNP for 11/29/20 @3 :81 PM for evaluation of symptoms  Evaluation of current treatment plan related to chronic kidney disease self management and patient's adherence to plan as established by provider      Reviewed prescribed diet increase water intake as discussed Counseled on the importance of exercise goals with target of 150 minutes per week     Advised patient, providing education and rationale, to monitor blood pressure daily and record, calling PCP for findings outside established parameters    Discussed complications of poorly controlled blood pressure such as heart disease, stroke, circulatory complications, vision complications,  kidney impairment, sexual dysfunction    Discussed plans with patient for ongoing care management follow up and provided patient with direct contact information for care management team    Provided education on kidney disease progression    Last  practice recorded BP readings:  BP Readings from Last 3 Encounters:  09/07/20 (!) 175/80  06/29/20 132/60  06/29/20 132/60  Most recent eGFR/CrCl:  Lab Results  Component Value Date   EGFR 34 (L) 06/29/2020    No components found for: CRCL   Urinary Tract Infection Interventions:  (Status:  New goal.)  Short Term Goal Evaluation of current treatment plan related to  UTI ,  self-management and patient's adherence to plan as established by provider Determined patient's daughter called 911 on 12/20/20 after finding her mother had fallen and was bleeding, discussed she noticed patient was confused and having some hallucinations prior to the fall Review of patient status, including review of consultant's reports, relevant laboratory and other test results, and medications completed Advised patient and daughter [name], per urinalysis obtained in the ED on 12/20/20, patient continues to have a urinary tract infection, discussed patient opted not to be admitted but to take an oral antibiotic at home  Determined the hospital MD prescribed Cephalexin 500 mg 4 times daily; Reviewed medications with patient and discussed importance of medication adherence, emphasizing the importance to complete the full course of antibiotics Educated patient/daughter regarding the importance to increase water intake as directed to help keep patient well hydrated and maintain/improve renal function Educated patient/daughter about signs/symptoms suggestive of worsening UTI and when to seek medical attention and or take patient back to the ED Discussed plans with patient for ongoing care management follow up and provided patient with direct contact information for care management team  Patient Goals/Self-Care Activities: Take all medications as prescribed Attend all scheduled provider appointments Call provider office for new concerns or questions   Follow Up Plan:  Telephone follow up appointment with care management  team member scheduled for:  01/03/21     Plan:Telephone follow up appointment with care management team member scheduled for:  01/03/21  Barb Merino, RN, BSN, CCM Care Management Coordinator [place] [facility]  Direct Phone: [phone]

## 2020-12-22 NOTE — Patient Instructions (Addendum)
Visit Information   Thank you for taking time to visit with me today. Please don't hesitate to contact me if I can be of assistance to you before our next scheduled telephone appointment.  Following are the goals we discussed today:  (Copy and paste patient goals from clinical care plan here)  Our next appointment is by telephone on 01/03/21 at 9:45 AM   Please call the care guide team at [phone] if you need to cancel or reschedule your appointment.   Following is a copy of your full care plan:  Care Plan : [place] of Care  Updates made by Lynne Logan, RN since 12/22/2020 12:00 AM     Problem: No Plan of care established for management of chronic disease states (HTN, Chronic Renal Disease stage III, Hypertensive Nephropathy, osteoporosis)   Priority: High     Long-Range Goal: Development of plan of care for chroinc disease management for HTN, Chronic Renal Disease stage III, Hypertensive Nephropathy, osteoporosis   Start Date: 11/23/2020  Expected End Date: 11/23/2021  Recent Progress: On track  Priority: High  Note:   Current Barriers:  Knowledge Deficits related to plan of care for management of HTN, Chronic Renal Disease stage III, Hypertensive Nephropathy, osteoporosis Chronic Disease Management support and education needs related to HTN, Chronic Renal Disease stage III, Hypertensive Nephropathy, osteoporosis  RNCM Clinical Goal(s):  Patient will verbalize basic understanding of  HTN, Chronic Renal Disease stage III, Hypertensive Nephropathy, osteoporosis disease process and self health management plan   demonstrate Ongoing health management independence   continue to work with RN Care Manager to address care management and care coordination needs related to  HTN, Chronic Renal Disease stage III, Hypertensive Nephropathy, osteoporosis will demonstrate ongoing self health care management ability    through collaboration with RN Care manager, provider, and care  team.   Interventions: 1:1 collaboration with primary care provider regarding development and update of comprehensive plan of care as evidenced by provider attestation and co-signature Inter-disciplinary care team collaboration (see longitudinal plan of care) Evaluation of current treatment plan related to  self management and patient's adherence to plan as established by provider  Hypertension Interventions: (Status:  Condition stable.  Not addressed this visit.) Last practice recorded BP readings:  BP Readings from Last 3 Encounters:  09/07/20 (!) 175/80  06/29/20 132/60  06/29/20 132/60  Most recent eGFR/CrCl:  Lab Results  Component Value Date   EGFR 34 (L) 06/29/2020    No components found for: CRCL Completed inbound call with daughter [name] and patient  Evaluation of current treatment plan related to hypertension self management and patient's adherence to plan as established by provider; Reviewed medications with patient and discussed importance of compliance; Counseled on the importance of exercise goals with target of 150 minutes per week Discussed plans with patient for ongoing care management follow up and provided patient with direct contact information for care management team; Advised patient, providing education and rationale, to monitor blood pressure daily and record, calling PCP for findings outside established parameters;  Provided education on prescribed diet low Sodium;  Assessed social determinant of health barriers;    Chronic Kidney Disease Interventions:  (Status:  Condition stable.  Not addressed this visit.) Completed inbound call with daughter [name] and patient Discussed patient concerns related to recent urinary frequency with nocturia and incontinence, symptoms started 2 weeks ago Assessed for other symptoms suggestive of UTI and or overactive bladder, patient denies having any other symptoms Educated on symptoms  suggestive of UTI, advised a urine sample is  needed in order to diagnose/treat; Educated on potential complications if left untreated Added patient to PCP schedule with Minette Brine FNP for 11/29/20 @3 :61 PM for evaluation of symptoms  Evaluation of current treatment plan related to chronic kidney disease self management and patient's adherence to plan as established by provider      Reviewed prescribed diet increase water intake as discussed Counseled on the importance of exercise goals with target of 150 minutes per week     Advised patient, providing education and rationale, to monitor blood pressure daily and record, calling PCP for findings outside established parameters    Discussed complications of poorly controlled blood pressure such as heart disease, stroke, circulatory complications, vision complications, kidney impairment, sexual dysfunction    Discussed plans with patient for ongoing care management follow up and provided patient with direct contact information for care management team    Provided education on kidney disease progression    Last practice recorded BP readings:  BP Readings from Last 3 Encounters:  09/07/20 (!) 175/80  06/29/20 132/60  06/29/20 132/60  Most recent eGFR/CrCl:  Lab Results  Component Value Date   EGFR 34 (L) 06/29/2020    No components found for: CRCL   Urinary Tract Infection Interventions:  (Status:  New goal.)  Short Term Goal Evaluation of current treatment plan related to  UTI ,  self-management and patient's adherence to plan as established by provider Determined patient's daughter called 911 on 12/20/20 after finding her mother had fallen and was bleeding, discussed she noticed patient was confused and having some hallucinations prior to the fall Review of patient status, including review of consultant's reports, relevant laboratory and other test results, and medications completed Advised patient and daughter [name], per urinalysis obtained in the ED on 12/20/20, patient continues to have  a urinary tract infection, discussed patient opted not to be admitted but to take an oral antibiotic at home  Determined the hospital MD prescribed Cephalexin 500 mg 4 times daily; Reviewed medications with patient and discussed importance of medication adherence, emphasizing the importance to complete the full course of antibiotics Educated patient/daughter regarding the importance to increase water intake as directed to help keep patient well hydrated and maintain/improve renal function Educated patient/daughter about signs/symptoms suggestive of worsening UTI and when to seek medical attention and or take patient back to the ED Discussed plans with patient for ongoing care management follow up and provided patient with direct contact information for care management team  Patient Goals/Self-Care Activities: Take all medications as prescribed Attend all scheduled provider appointments Call provider office for new concerns or questions   Follow Up Plan:  Telephone follow up appointment with care management team member scheduled for:  01/03/21      Consent to CCM Services: Ms. Markgraf was given information about Chronic Care Management services including:  CCM service includes personalized support from designated clinical staff supervised by her physician, including individualized plan of care and coordination with other care providers 24/7 contact phone numbers for assistance for urgent and routine care needs. Service will only be billed when office clinical staff spend 20 minutes or more in a month to coordinate care. Only one practitioner may furnish and bill the service in a calendar month. The patient may stop CCM services at any time (effective at the end of the month) by phone call to the office staff. The patient will be responsible for cost sharing (co-pay) of up to  20% of the service fee (after annual deductible is met).  Patient agreed to services and verbal consent obtained.    Patient verbalizes understanding of instructions provided today and agrees to view in [place].   Telephone follow up appointment with care management team member scheduled for: 01/03/21

## 2020-12-25 ENCOUNTER — Telehealth: Payer: Self-pay

## 2020-12-25 NOTE — Chronic Care Management (AMB) (Signed)
   12-25-2020: patient's daughter stated patient has an appointment with another provider and rescheduled 12-26-2020 appointment with [name] CPP to January.  Care Gaps: AWV 07-19-2021 Covid booster overdue Flu vaccine overdue  Star Rating Drug: None  Any gaps in medications fill history? No  [place] Pharmacist Assistant [phone]

## 2020-12-26 ENCOUNTER — Telehealth: Payer: Self-pay

## 2020-12-26 DIAGNOSIS — I129 Hypertensive chronic kidney disease with stage 1 through stage 4 chronic kidney disease, or unspecified chronic kidney disease: Secondary | ICD-10-CM | POA: Diagnosis not present

## 2020-12-26 DIAGNOSIS — M329 Systemic lupus erythematosus, unspecified: Secondary | ICD-10-CM | POA: Diagnosis not present

## 2020-12-26 DIAGNOSIS — N183 Chronic kidney disease, stage 3 unspecified: Secondary | ICD-10-CM | POA: Diagnosis not present

## 2020-12-26 DIAGNOSIS — N390 Urinary tract infection, site not specified: Secondary | ICD-10-CM | POA: Diagnosis not present

## 2020-12-27 ENCOUNTER — Telehealth: Payer: Self-pay

## 2020-12-27 ENCOUNTER — Telehealth: Payer: Medicare Other

## 2020-12-27 NOTE — Telephone Encounter (Signed)
  Care Management   Follow Up Note   12/27/2020 Name: Whitney Henderson MRN: [id number] DOB: 15-Jan-1929   Referred by: [name], MD Reason for referral : Chronic Care Management (Unsuccessful call)   A second unsuccessful telephone outreach was attempted today. The patient was referred to the case management team for assistance with care management and care coordination. HIPAA compliant voice message left requesting a return call.  Follow Up Plan: The care management team will reach out to the patient again over the next 21 days.   Daneen Schick, BSW, CDP Social Worker, Certified Dementia Practitioner [place] [facility] [place] [facility] [phone]

## 2020-12-28 ENCOUNTER — Ambulatory Visit: Payer: Medicare Other

## 2020-12-28 DIAGNOSIS — N183 Chronic kidney disease, stage 3 unspecified: Secondary | ICD-10-CM

## 2020-12-28 DIAGNOSIS — I129 Hypertensive chronic kidney disease with stage 1 through stage 4 chronic kidney disease, or unspecified chronic kidney disease: Secondary | ICD-10-CM

## 2020-12-28 DIAGNOSIS — I10 Essential (primary) hypertension: Secondary | ICD-10-CM

## 2020-12-28 NOTE — Chronic Care Management (AMB) (Signed)
Chronic Care Management    Social Work Note  12/28/2020 Name: Whitney Henderson MRN: [id number] DOB: [birth year]  Whitney Henderson is a 87 y.o. year old female who is a primary care patient of [name], MD. The CCM team was consulted to assist the patient with chronic disease management and/or care coordination needs related to:  HTN, Chronic renal disease stage III, Hypertensive nephropathy .   Engaged with patient by telephone for follow up visit in response to provider referral for social work chronic care management and care coordination services.   Consent to Services:  The patient was given information about Chronic Care Management services, agreed to services, and gave verbal consent prior to initiation of services.  Please see initial visit note for detailed documentation.   Patient agreed to services and consent obtained.   Assessment: Review of patient past medical history, allergies, medications, and health status, including review of relevant consultants reports was performed today as part of a comprehensive evaluation and provision of chronic care management and care coordination services.     SDOH (Social Determinants of Health) assessments and interventions performed:    Advanced Directives Status: Not addressed in this encounter.  CCM Care Plan  Allergies  Allergen Reactions   Codeine Rash    Outpatient Encounter Medications as of 12/28/2020  Medication Sig   acetaminophen (TYLENOL) 500 MG tablet Take 500 mg by mouth every 6 (six) hours as needed for mild pain or moderate pain.   amLODipine (NORVASC) 2.5 MG tablet TAKE 1 TABLET(2.5 MG) BY MOUTH TWICE DAILY (Patient taking differently: Take 2.5 mg by mouth 2 (two) times daily.)   Apoaequorin (PREVAGEN PO) Take 1 capsule by mouth every other day.   ASPIRIN 81 PO Take 81 mg by mouth as needed (for pain or headache).   B Complex Vitamins (B COMPLEX PO) Take 1 capsule by mouth every other day.   betamethasone  dipropionate (DIPROLENE) 0.05 % cream APPLY THIN LAYER EXTERNALLY TO THE AFFECTED AREA EVERY DAY AS NEEDED (Patient taking differently: 1 application daily as needed (skin irritation).)   Calcium Carb-Cholecalciferol (CALCIUM 500 +D PO) Take 1 tablet by mouth every other day.   cephALEXin (KEFLEX) 500 MG capsule Take 1 capsule (500 mg total) by mouth 4 (four) times daily.   donepezil (ARICEPT) 5 MG tablet TAKE 1 TABLET(5 MG) BY MOUTH AT BEDTIME (Patient taking differently: Take 5 mg by mouth at bedtime.)   Multiple Vitamins-Minerals (CENTRUM SILVER PO) Take 1 tablet by mouth every other day.   Propylene Glycol (SYSTANE BALANCE OP) Place 1 drop into both eyes daily as needed (for itchy eyes).   sertraline (ZOLOFT) 25 MG tablet TAKE 1 TABLET(25 MG) BY MOUTH DAILY (Patient taking differently: Take 25 mg by mouth daily.)   vitamin C (ASCORBIC ACID) 500 MG tablet Take 500 mg by mouth every other day.   No facility-administered encounter medications on file as of 12/28/2020.    Patient Active Problem List   Diagnosis Date Noted   Tremor 01/06/2020   Age-related osteoporosis with current pathological fracture 11/05/2018   Grief reaction 03/31/2018   Hypertensive nephropathy 03/03/2018   Chronic renal disease, stage III ([place]) 12/11/2017   Hypertension, essential, benign 10/30/2017   Anxiety 10/11/2017   Memory disorder 09/04/2016   Headache 04/27/2015   Hypertension 01/16/2013   UTI (lower urinary tract infection) 01/16/2013    Conditions to be addressed/monitored:  HTN, chronic renal disease stage III, hypertensive nephropathy ; Limited access to caregiver  Care Plan :  Social Work Plan of Care  Updates made by Daneen Schick since 12/28/2020 12:00 AM     Problem: Mobility and Independence      Goal: Obtain a caregiver in the home   Start Date: 12/28/2020  Priority: High  Note:   Current Barriers:  Chronic disease management support and education needs related to  HTN, Chronic Renal  Disease stage III, Hypertensive Nephropathy   Recent admission due to UTI Limited access to a caregiver Social Worker Clinical Goal(s):  patient will work with SW to identify and address any acute and/or chronic care coordination needs related to the self health management of  HTN, Chronic renal disease stage III, hypertensive nephropathy   Patient will follow up with long term care insurance policy as directed by SW SW Interventions:  Inter-disciplinary care team collaboration (see longitudinal plan of care) Collaboration with [name], MD regarding development and update of comprehensive plan of care as evidenced by provider attestation and co-signature Inbound call received from patient and her daughter [name] to discuss care coordination needs post discharge Discussed the patient is in need of a caregiver in the home for up to 5 hours per day Reviewed the patient does have a long term care insurance policy that does cover a caregiver in the home Advised the patient and her daughter to contact her long term care policy to determine what type of care is covered and if it must be provided by a specific licensure Provided the patient with a list of home care providers that serve in her county Discussed plans for the patient and her daughter to call these agencies to determine if they accept long term care insurance and if they have availability to meet the patients needs  Patient Goals/Self-Care Activities patient will: with the help of her daughter  -  contact long term insurance to review policy -Contact home care agencies to identify an agency who is available to meet patient needs -Contact SW as needed prior to next scheduled call  Follow Up Plan:  SW will follow up with the patient and her daughter over the next month       Follow Up Plan: SW will follow up with patient by phone over the next month.      Daneen Schick, BSW, CDP Social Worker, Certified Dementia  Practitioner [place] [facility] [phone]

## 2020-12-28 NOTE — Patient Instructions (Signed)
Social Worker Visit Information  Goals we discussed today:   Goals Addressed             This Visit's Progress    Obtain a Caregiver in the Home       Timeframe:  Short-Term Goal Priority:  High Start Date: 12.8.22                             Next planned outreach: 12.29.22     Patient Goals/Self-Care Activities patient will: with the help of her daughter  - contact long term insurance to review policy -Contact home care agencies to identify an agency who is available to meet patient needs -Contact SW as needed prior to next scheduled call                         Materials Provided: Verbal education about home care agencies provided by phone  The patient verbalized understanding of instructions, educational materials, and care plan provided today and declined offer to receive copy of patient instructions, educational materials, and care plan.   Follow Up Plan: SW will follow up with patient by phone over the next month   Daneen Schick, BSW, CDP Social Worker, Certified Dementia Practitioner [place] [facility] [phone]

## 2020-12-29 ENCOUNTER — Telehealth: Payer: Medicare Other

## 2021-01-01 ENCOUNTER — Encounter: Payer: Self-pay | Admitting: Internal Medicine

## 2021-01-01 ENCOUNTER — Other Ambulatory Visit: Payer: Self-pay

## 2021-01-01 ENCOUNTER — Ambulatory Visit (INDEPENDENT_AMBULATORY_CARE_PROVIDER_SITE_OTHER): Payer: Medicare Other | Admitting: Internal Medicine

## 2021-01-01 VITALS — BP 114/78 | HR 81 | Temp 98.0°F | Ht 60.4 in | Wt 117.2 lb

## 2021-01-01 DIAGNOSIS — Z79899 Other long term (current) drug therapy: Secondary | ICD-10-CM

## 2021-01-01 DIAGNOSIS — N183 Chronic kidney disease, stage 3 unspecified: Secondary | ICD-10-CM | POA: Diagnosis not present

## 2021-01-01 DIAGNOSIS — R7309 Other abnormal glucose: Secondary | ICD-10-CM

## 2021-01-01 DIAGNOSIS — N1832 Chronic kidney disease, stage 3b: Secondary | ICD-10-CM | POA: Diagnosis not present

## 2021-01-01 DIAGNOSIS — W19XXXD Unspecified fall, subsequent encounter: Secondary | ICD-10-CM | POA: Diagnosis not present

## 2021-01-01 DIAGNOSIS — Z6822 Body mass index (BMI) 22.0-22.9, adult: Secondary | ICD-10-CM

## 2021-01-01 DIAGNOSIS — I129 Hypertensive chronic kidney disease with stage 1 through stage 4 chronic kidney disease, or unspecified chronic kidney disease: Secondary | ICD-10-CM

## 2021-01-01 DIAGNOSIS — E876 Hypokalemia: Secondary | ICD-10-CM

## 2021-01-01 NOTE — Patient Instructions (Signed)

## 2021-01-01 NOTE — Progress Notes (Signed)
Rich Brave [place],acting as a Education administrator for Maximino Greenland, MD.,have documented all relevant documentation on the behalf of Maximino Greenland, MD,as directed by  Maximino Greenland, MD while in the presence of Maximino Greenland, MD.  This visit occurred during the SARS-CoV-2 public health emergency.  Safety protocols were in place, including screening questions prior to the visit, additional usage of staff PPE, and extensive cleaning of exam room while observing appropriate contact time as indicated for disinfecting solutions.  Subjective:     Patient ID: Whitney Henderson , female    DOB: 06/30/1928 , 87 y.o.   MRN: [id number]   Chief Complaint  Patient presents with   Hypertension    HPI  She presents for BP check. She reports compliance with meds. She is accompanied by her daughter, V. Today. She offers no concerns. Patient denies headaches, chest pain and shortness of breath.   Unfortunately, since her last visit, she suffered a fall. She presented to ER on 11/30 after an unwitnessed fall.  History is provided by EMS as well as her daughter.  Patient's daughter states that she speaks with her mother on the phone twice a day.  Night prior to ER presentation, she spoke with her on the phone at around 8 PM and patient seemed totally normal.  The next day, patient's daughter's phone call went unanswered.  Her brother went to her mother's home and found her on the floor in the kitchen.  There was dried blood present that appears to come from her left eyebrow area.  Patient was awake when her daughter got there.  She was able to talk to her daughter.  Patient's daughter does not feel that she was acutely confused.  EMS was called.  EMS noted some generalized weakness.  Patient was complaining of left shoulder pain.  No interventions were given.  Patient's daughter states that, prior to this fall, her last fall was 3 days ago.  At that time, she required assistance to get up off the ground.  Patient's  daughter feels that, at baseline, patient is unable to get off of the ground on her own. EDP suggested pt stay for admission, but patient and daughter declined. She was treated for UTI, pt states she completed all abx and feels better.   Hypertension This is a chronic problem. The current episode started more than 1 year ago. The problem has been gradually improving since onset. The problem is uncontrolled. Pertinent negatives include no blurred vision, chest pain, headaches, neck pain, orthopnea, palpitations or shortness of breath. The current treatment provides moderate improvement. Compliance problems include exercise.     Past Medical History:  Diagnosis Date   Anxiety    Cataracts, bilateral    Discoid lupus    Headache 04/27/2015   Left>right periorbital   Hypertension    Memory difficulties 09/04/2016     Family History  Problem Relation Age of Onset   Early death Mother    Cancer Father    Breast cancer Daughter        41s     Current Outpatient Medications:    acetaminophen (TYLENOL) 500 MG tablet, Take 500 mg by mouth every 6 (six) hours as needed for mild pain or moderate pain., Disp: , Rfl:    amLODipine (NORVASC) 2.5 MG tablet, TAKE 1 TABLET(2.5 MG) BY MOUTH TWICE DAILY (Patient taking differently: Take 2.5 mg by mouth 2 (two) times daily.), Disp: 180 tablet, Rfl: 1   Apoaequorin (PREVAGEN PO), Take  1 capsule by mouth every other day., Disp: , Rfl:    ASPIRIN 81 PO, Take 81 mg by mouth as needed (for pain or headache)., Disp: , Rfl:    B Complex Vitamins (B COMPLEX PO), Take 1 capsule by mouth every other day., Disp: , Rfl:    betamethasone dipropionate (DIPROLENE) 0.05 % cream, APPLY THIN LAYER EXTERNALLY TO THE AFFECTED AREA EVERY DAY AS NEEDED (Patient taking differently: 1 application daily as needed (skin irritation).), Disp: 60 g, Rfl: 2   Calcium Carb-Cholecalciferol (CALCIUM 500 +D PO), Take 1 tablet by mouth every other day., Disp: , Rfl:    cephALEXin (KEFLEX)  500 MG capsule, Take 1 capsule (500 mg total) by mouth 4 (four) times daily., Disp: 28 capsule, Rfl: 0   donepezil (ARICEPT) 5 MG tablet, TAKE 1 TABLET(5 MG) BY MOUTH AT BEDTIME (Patient taking differently: Take 5 mg by mouth at bedtime.), Disp: 30 tablet, Rfl: 5   Multiple Vitamins-Minerals (CENTRUM SILVER PO), Take 1 tablet by mouth every other day., Disp: , Rfl:    Propylene Glycol (SYSTANE BALANCE OP), Place 1 drop into both eyes daily as needed (for itchy eyes)., Disp: , Rfl:    sertraline (ZOLOFT) 25 MG tablet, TAKE 1 TABLET(25 MG) BY MOUTH DAILY (Patient taking differently: Take 25 mg by mouth daily.), Disp: 90 tablet, Rfl: 1   vitamin C (ASCORBIC ACID) 500 MG tablet, Take 500 mg by mouth every other day., Disp: , Rfl:    Allergies  Allergen Reactions   Codeine Rash     Review of Systems  Constitutional: Negative.   Eyes:  Negative for blurred vision.  Respiratory: Negative.  Negative for shortness of breath.   Cardiovascular: Negative.  Negative for chest pain, palpitations and orthopnea.  Gastrointestinal: Negative.   Musculoskeletal:  Negative for neck pain.  Neurological: Negative.  Negative for headaches.  Psychiatric/Behavioral: Negative.      Today's Vitals   01/01/21 1042  BP: 114/78  Pulse: 81  Temp: 98 F (36.7 C)  Weight: 117 lb 3.2 oz (53.2 kg)  Height: 5' 0.4" (1.534 m)  PainSc: 0-No pain   Body mass index is 22.59 kg/m.  Wt Readings from Last 3 Encounters:  01/01/21 117 lb 3.2 oz (53.2 kg)  12/20/20 116 lb 13.5 oz (53 kg)  11/29/20 117 lb (53.1 kg)     Objective:  Physical Exam Vitals and nursing note reviewed.  Constitutional:      Appearance: Normal appearance.  HENT:     Head: Normocephalic and atraumatic.     Nose:     Comments: Masked     Mouth/Throat:     Comments: Masked  Eyes:     Extraocular Movements: Extraocular movements intact.  Cardiovascular:     Rate and Rhythm: Normal rate and regular rhythm.     Heart sounds: Normal heart  sounds.  Pulmonary:     Effort: Pulmonary effort is normal.     Breath sounds: Normal breath sounds.  Musculoskeletal:     Cervical back: Normal range of motion.  Skin:    General: Skin is warm.  Neurological:     General: No focal deficit present.     Mental Status: She is alert.  Psychiatric:        Mood and Affect: Mood normal.        Behavior: Behavior normal.        Assessment And Plan:     1. Hypertensive nephropathy Comments: Chronic, well controlled. No med changes. Encouraged  to follow low sodium diet. I will check renal function today.  - BMP8+EGFR  2. Stage 3b chronic kidney disease ([place]) Comments: Chronic, I will check renal function today. She is encouraged to stay well hydrated and keep BP controlled.  - BMP8+EGFR  3. Fall, subsequent encounter Comments: Occurred 11/30. ER notes reviewed in full detail. Pt and daughter agree for home health PT.  4. Other abnormal glucose Comments: Her a1c has been elevated in the past. I will recheck this today.  - Hemoglobin A1c  5. Hypokalemia Comments: ER records reviewed, I will check BMP to re-evaluate potassium level.   6. BMI 22.0-22.9, adult  7. Drug therapy - Vitamin B12   Patient was given opportunity to ask questions. Patient verbalized understanding of the plan and was able to repeat key elements of the plan. All questions were answered to their satisfaction.   I, Maximino Greenland, MD, have reviewed all documentation for this visit. The documentation on 01/01/21 for the exam, diagnosis, procedures, and orders are all accurate and complete.   IF YOU HAVE BEEN REFERRED TO A SPECIALIST, IT MAY TAKE 1-2 WEEKS TO SCHEDULE/PROCESS THE REFERRAL. IF YOU HAVE NOT HEARD FROM US/SPECIALIST IN TWO WEEKS, PLEASE GIVE [place] A CALL AT [phone].   THE PATIENT IS ENCOURAGED TO PRACTICE SOCIAL DISTANCING DUE TO THE COVID-19 PANDEMIC.

## 2021-01-02 LAB — BMP8+EGFR
BUN/Creatinine Ratio: 21 (ref 12–28)
BUN: 27 mg/dL (ref 10–36)
CO2: 21 mmol/L (ref 20–29)
Calcium: 9.5 mg/dL (ref 8.7–10.3)
Chloride: 102 mmol/L (ref 96–106)
Creatinine, Ser: 1.27 mg/dL — ABNORMAL HIGH (ref 0.57–1.00)
Glucose: 77 mg/dL (ref 70–99)
Potassium: 4.5 mmol/L (ref 3.5–5.2)
Sodium: 139 mmol/L (ref 134–144)
eGFR: 40 mL/min/{1.73_m2} — ABNORMAL LOW (ref >59)

## 2021-01-02 LAB — HEMOGLOBIN A1C
Est. average glucose Bld gHb Est-mCnc: 126 mg/dL
Hgb A1c MFr Bld: 6 % — ABNORMAL HIGH (ref 4.8–5.6)

## 2021-01-02 LAB — VITAMIN B12: Vitamin B-12: 783 pg/mL (ref 232–1245)

## 2021-01-03 ENCOUNTER — Telehealth: Payer: Medicare Other

## 2021-01-03 ENCOUNTER — Ambulatory Visit: Payer: Self-pay

## 2021-01-03 DIAGNOSIS — M810 Age-related osteoporosis without current pathological fracture: Secondary | ICD-10-CM

## 2021-01-03 DIAGNOSIS — N1832 Chronic kidney disease, stage 3b: Secondary | ICD-10-CM

## 2021-01-03 DIAGNOSIS — I129 Hypertensive chronic kidney disease with stage 1 through stage 4 chronic kidney disease, or unspecified chronic kidney disease: Secondary | ICD-10-CM

## 2021-01-03 DIAGNOSIS — I10 Essential (primary) hypertension: Secondary | ICD-10-CM

## 2021-01-03 NOTE — Chronic Care Management (AMB) (Signed)
Chronic Care Management   CCM RN Visit Note  01/03/2021 Name: Whitney Henderson MRN: [id number] DOB: 26-Aug-1928  Subjective: Whitney Henderson is a 87 y.o. year old female who is a primary care patient of [name], MD. The care management team was consulted for assistance with disease management and care coordination needs.    Engaged with patient by telephone for follow up visit in response to provider referral for case management and/or care coordination services.   Consent to Services:  The patient was given information about Chronic Care Management services, agreed to services, and gave verbal consent prior to initiation of services.  Please see initial visit note for detailed documentation.   Patient agreed to services and verbal consent obtained.   Assessment: Review of patient past medical history, allergies, medications, health status, including review of consultants reports, laboratory and other test data, was performed as part of comprehensive evaluation and provision of chronic care management services.   SDOH (Social Determinants of Health) assessments and interventions performed:  Yes, no acute challenges  CCM Care Plan  Allergies  Allergen Reactions   Codeine Rash    Outpatient Encounter Medications as of 01/03/2021  Medication Sig   acetaminophen (TYLENOL) 500 MG tablet Take 500 mg by mouth every 6 (six) hours as needed for mild pain or moderate pain.   amLODipine (NORVASC) 2.5 MG tablet TAKE 1 TABLET(2.5 MG) BY MOUTH TWICE DAILY (Patient taking differently: Take 2.5 mg by mouth 2 (two) times daily.)   Apoaequorin (PREVAGEN PO) Take 1 capsule by mouth every other day.   ASPIRIN 81 PO Take 81 mg by mouth as needed (for pain or headache).   B Complex Vitamins (B COMPLEX PO) Take 1 capsule by mouth every other day.   betamethasone dipropionate (DIPROLENE) 0.05 % cream APPLY THIN LAYER EXTERNALLY TO THE AFFECTED AREA EVERY DAY AS NEEDED (Patient taking differently: 1  application daily as needed (skin irritation).)   Calcium Carb-Cholecalciferol (CALCIUM 500 +D PO) Take 1 tablet by mouth every other day.   cephALEXin (KEFLEX) 500 MG capsule Take 1 capsule (500 mg total) by mouth 4 (four) times daily.   donepezil (ARICEPT) 5 MG tablet TAKE 1 TABLET(5 MG) BY MOUTH AT BEDTIME (Patient taking differently: Take 5 mg by mouth at bedtime.)   Multiple Vitamins-Minerals (CENTRUM SILVER PO) Take 1 tablet by mouth every other day.   Propylene Glycol (SYSTANE BALANCE OP) Place 1 drop into both eyes daily as needed (for itchy eyes).   sertraline (ZOLOFT) 25 MG tablet TAKE 1 TABLET(25 MG) BY MOUTH DAILY (Patient taking differently: Take 25 mg by mouth daily.)   vitamin C (ASCORBIC ACID) 500 MG tablet Take 500 mg by mouth every other day.   No facility-administered encounter medications on file as of 01/03/2021.    Patient Active Problem List   Diagnosis Date Noted   Tremor 01/06/2020   Age-related osteoporosis with current pathological fracture 11/05/2018   Grief reaction 03/31/2018   Hypertensive nephropathy 03/03/2018   Chronic renal disease, stage III ([place]) 12/11/2017   Hypertension, essential, benign 10/30/2017   Anxiety 10/11/2017   Memory disorder 09/04/2016   Headache 04/27/2015   Hypertension 01/16/2013   UTI (lower urinary tract infection) 01/16/2013    Conditions to be addressed/monitored: HTN, Chronic Renal Disease stage III, Hypertensive Nephropathy, osteoporosis  Care Plan : RN Care Manager Plan of Care  Updates made by Lynne Logan, RN since 01/03/2021 12:00 AM     Problem: No Plan of care established  for management of chronic disease states (HTN, Chronic Renal Disease stage III, Hypertensive Nephropathy, osteoporosis)   Priority: High     Long-Range Goal: Development of plan of care for chroinc disease management for HTN, Chronic Renal Disease stage III, Hypertensive Nephropathy, osteoporosis   Start Date: 11/23/2020  Expected End Date:  11/23/2021  Recent Progress: On track  Priority: High  Note:   Current Barriers:  Knowledge Deficits related to plan of care for management of HTN, Chronic Renal Disease stage III, Hypertensive Nephropathy, osteoporosis Chronic Disease Management support and education needs related to HTN, Chronic Renal Disease stage III, Hypertensive Nephropathy, osteoporosis  RNCM Clinical Goal(s):  Patient will verbalize basic understanding of  HTN, Chronic Renal Disease stage III, Hypertensive Nephropathy, osteoporosis disease process and self health management plan   demonstrate Ongoing health management independence   continue to work with RN Care Manager to address care management and care coordination needs related to  HTN, Chronic Renal Disease stage III, Hypertensive Nephropathy, osteoporosis will demonstrate ongoing self health care management ability    through collaboration with RN Care manager, provider, and care team.   Interventions: 1:1 collaboration with primary care provider regarding development and update of comprehensive plan of care as evidenced by provider attestation and co-signature Inter-disciplinary care team collaboration (see longitudinal plan of care) Evaluation of current treatment plan related to  self management and patient's adherence to plan as established by provider  Hypertension Interventions: (Status:  Goal on Track. Yes) Objective:  Last practice recorded BP readings:  BP Readings from Last 3 Encounters:  01/01/21 114/78  12/20/20 (!) 172/68  11/29/20 112/70   Most recent eGFR/CrCl:  Lab Results  Component Value Date   EGFR 40 (L) 01/01/2021    No components found for: CRCL Review of patient status, including review of consultant's reports, relevant laboratory and other test results, and medications completed. Evaluation of current treatment plan related to hypertension self management and patient's adherence to plan as established by provider Provided  education on prescribed diet low Sodium Assessed social determinant of health barriers Discussed plans with patient for ongoing care management follow up and provided patient with direct contact information for care management team   Chronic Kidney Disease Interventions:  (Status:  Goal on track:  Yes.) Completed inbound call with daughter [name] and patient Evaluation of current treatment plan related to chronic kidney disease self management and patient's adherence to plan as established by provider      Reviewed prescribed diet increase water intake as discussed Provided education on kidney disease progression    Review of patient status, including review of consultant's reports, relevant laboratory and other test results, and medications completed. Discussed plans with patient for ongoing care management follow up and provided patient with direct contact information for care management team Lab Results  Component Value Date   EGFR 40 (L) 01/01/2021  Urinary Tract Infection Interventions:  (Status:  Goal on track:  Yes.)  Short Term Goal Evaluation of current treatment plan related to  UTI ,  self-management and patient's adherence to plan as established by provider Determined patient completed her full course of antibiotics for last UTI Review of patient status, including review of consultant's reports, relevant laboratory and other test results, and medications completed, determined last urinalysis indicated no indication of UTI Re-Educated patient regarding the importance to increase water intake as directed to help keep patient well hydrated and maintain/improve renal function Re-Educated patient about signs/symptoms suggestive of reoccurring UTI and when to seek  medical attention  Discussed plans with patient for ongoing care management follow up and provided patient with direct contact information for care management team  Patient Goals/Self-Care Activities: Take all medications as  prescribed Attend all scheduled provider appointments Call provider office for new concerns or questions  drink 6 to 8 glasses of water each day  Follow Up Plan:  Telephone follow up appointment with care management team member scheduled for:  03/06/21      Plan:Telephone follow up appointment with care management team member scheduled for:  03/06/21  Barb Merino, RN, BSN, CCM Care Management Coordinator [facility]  Direct Phone: [phone]

## 2021-01-03 NOTE — Patient Instructions (Signed)
Visit Information   Thank you for taking time to visit with me today. Please don't hesitate to contact me if I can be of assistance to you before our next scheduled telephone appointment.  Following are the goals we discussed today:  (Copy and paste patient goals from clinical care plan here)  Our next appointment is by telephone on 03/06/21 at 10:20 AM  Please call the care guide team at [phone] if you need to cancel or reschedule your appointment.   If you are experiencing a Mental Health or [facility] or need someone to talk to, please call 1-800-273-TALK (toll free, 24 hour hotline)   Following is a copy of your full care plan:  Care Plan : [place] of Care  Updates made by Lynne Logan, RN since 01/03/2021 12:00 AM     Problem: No Plan of care established for management of chronic disease states (HTN, Chronic Renal Disease stage III, Hypertensive Nephropathy, osteoporosis)   Priority: High     Long-Range Goal: Development of plan of care for chroinc disease management for HTN, Chronic Renal Disease stage III, Hypertensive Nephropathy, osteoporosis   Start Date: 11/23/2020  Expected End Date: 11/23/2021  Recent Progress: On track  Priority: High  Note:   Current Barriers:  Knowledge Deficits related to plan of care for management of HTN, Chronic Renal Disease stage III, Hypertensive Nephropathy, osteoporosis Chronic Disease Management support and education needs related to HTN, Chronic Renal Disease stage III, Hypertensive Nephropathy, osteoporosis  RNCM Clinical Goal(s):  Patient will verbalize basic understanding of  HTN, Chronic Renal Disease stage III, Hypertensive Nephropathy, osteoporosis disease process and self health management plan   demonstrate Ongoing health management independence   continue to work with RN Care Manager to address care management and care coordination needs related to  HTN, Chronic Renal Disease stage III,  Hypertensive Nephropathy, osteoporosis will demonstrate ongoing self health care management ability    through collaboration with RN Care manager, provider, and care team.   Interventions: 1:1 collaboration with primary care provider regarding development and update of comprehensive plan of care as evidenced by provider attestation and co-signature Inter-disciplinary care team collaboration (see longitudinal plan of care) Evaluation of current treatment plan related to  self management and patient's adherence to plan as established by provider  Hypertension Interventions: (Status:  Goal on Track. Yes) Objective:  Last practice recorded BP readings:  BP Readings from Last 3 Encounters:  01/01/21 114/78  12/20/20 (!) 172/68  11/29/20 112/70   Most recent eGFR/CrCl:  Lab Results  Component Value Date   EGFR 40 (L) 01/01/2021    No components found for: CRCL Review of patient status, including review of consultant's reports, relevant laboratory and other test results, and medications completed. Evaluation of current treatment plan related to hypertension self management and patient's adherence to plan as established by provider Provided education on prescribed diet low Sodium Assessed social determinant of health barriers Discussed plans with patient for ongoing care management follow up and provided patient with direct contact information for care management team   Chronic Kidney Disease Interventions:  (Status:  Goal on track:  Yes.) Completed inbound call with daughter [name] and patient Evaluation of current treatment plan related to chronic kidney disease self management and patient's adherence to plan as established by provider      Reviewed prescribed diet increase water intake as discussed Provided education on kidney disease progression    Review of patient status, including review of  consultant's reports, relevant laboratory and other test results, and medications  completed. Discussed plans with patient for ongoing care management follow up and provided patient with direct contact information for care management team Lab Results  Component Value Date   EGFR 40 (L) 01/01/2021  Urinary Tract Infection Interventions:  (Status:  Goal on track:  Yes.)  Short Term Goal Evaluation of current treatment plan related to  UTI ,  self-management and patient's adherence to plan as established by provider Determined patient completed her full course of antibiotics for last UTI Review of patient status, including review of consultant's reports, relevant laboratory and other test results, and medications completed, determined last urinalysis indicated no indication of UTI Re-Educated patient regarding the importance to increase water intake as directed to help keep patient well hydrated and maintain/improve renal function Re-Educated patient about signs/symptoms suggestive of reoccurring UTI and when to seek medical attention  Discussed plans with patient for ongoing care management follow up and provided patient with direct contact information for care management team  Patient Goals/Self-Care Activities: Take all medications as prescribed Attend all scheduled provider appointments Call provider office for new concerns or questions  drink 6 to 8 glasses of water each day  Follow Up Plan:  Telephone follow up appointment with care management team member scheduled for:  03/06/21      Consent to CCM Services: Ms. Failla was given information about Chronic Care Management services including:  CCM service includes personalized support from designated clinical staff supervised by her physician, including individualized plan of care and coordination with other care providers 24/7 contact phone numbers for assistance for urgent and routine care needs. Service will only be billed when office clinical staff spend 20 minutes or more in a month to coordinate care. Only one  practitioner may furnish and bill the service in a calendar month. The patient may stop CCM services at any time (effective at the end of the month) by phone call to the office staff. The patient will be responsible for cost sharing (co-pay) of up to 20% of the service fee (after annual deductible is met).  Patient agreed to services and verbal consent obtained.   The patient verbalized understanding of instructions, educational materials, and care plan provided today and declined offer to receive copy of patient instructions, educational materials, and care plan.   Telephone follow up appointment with care management team member scheduled for: 03/06/21

## 2021-01-05 ENCOUNTER — Telehealth: Payer: Medicare Other

## 2021-01-05 ENCOUNTER — Ambulatory Visit: Payer: Self-pay

## 2021-01-05 DIAGNOSIS — I129 Hypertensive chronic kidney disease with stage 1 through stage 4 chronic kidney disease, or unspecified chronic kidney disease: Secondary | ICD-10-CM

## 2021-01-05 DIAGNOSIS — M810 Age-related osteoporosis without current pathological fracture: Secondary | ICD-10-CM

## 2021-01-05 DIAGNOSIS — N1832 Chronic kidney disease, stage 3b: Secondary | ICD-10-CM

## 2021-01-05 DIAGNOSIS — I10 Essential (primary) hypertension: Secondary | ICD-10-CM

## 2021-01-05 NOTE — Chronic Care Management (AMB) (Signed)
Chronic Care Management   CCM RN Visit Note  01/05/2021 Name: Whitney Henderson MRN: [id number] DOB: 1928/10/19  Subjective: Whitney Henderson is a 87 y.o. year old female who is a primary care patient of [name], MD. The care management team was consulted for assistance with disease management and care coordination needs.    Engaged with patient by telephone for follow up visit in response to provider referral for case management and/or care coordination services.   Consent to Services:  The patient was given information about Chronic Care Management services, agreed to services, and gave verbal consent prior to initiation of services.  Please see initial visit note for detailed documentation.   Patient agreed to services and verbal consent obtained.   Assessment: Review of patient past medical history, allergies, medications, health status, including review of consultants reports, laboratory and other test data, was performed as part of comprehensive evaluation and provision of chronic care management services.   SDOH (Social Determinants of Health) assessments and interventions performed:    CCM Care Plan  Allergies  Allergen Reactions   Codeine Rash    Outpatient Encounter Medications as of 01/05/2021  Medication Sig   acetaminophen (TYLENOL) 500 MG tablet Take 500 mg by mouth every 6 (six) hours as needed for mild pain or moderate pain.   amLODipine (NORVASC) 2.5 MG tablet TAKE 1 TABLET(2.5 MG) BY MOUTH TWICE DAILY (Patient taking differently: Take 2.5 mg by mouth 2 (two) times daily.)   Apoaequorin (PREVAGEN PO) Take 1 capsule by mouth every other day.   ASPIRIN 81 PO Take 81 mg by mouth as needed (for pain or headache).   B Complex Vitamins (B COMPLEX PO) Take 1 capsule by mouth every other day.   betamethasone dipropionate (DIPROLENE) 0.05 % cream APPLY THIN LAYER EXTERNALLY TO THE AFFECTED AREA EVERY DAY AS NEEDED (Patient taking differently: 1 application daily as  needed (skin irritation).)   Calcium Carb-Cholecalciferol (CALCIUM 500 +D PO) Take 1 tablet by mouth every other day.   cephALEXin (KEFLEX) 500 MG capsule Take 1 capsule (500 mg total) by mouth 4 (four) times daily.   donepezil (ARICEPT) 5 MG tablet TAKE 1 TABLET(5 MG) BY MOUTH AT BEDTIME (Patient taking differently: Take 5 mg by mouth at bedtime.)   Multiple Vitamins-Minerals (CENTRUM SILVER PO) Take 1 tablet by mouth every other day.   Propylene Glycol (SYSTANE BALANCE OP) Place 1 drop into both eyes daily as needed (for itchy eyes).   sertraline (ZOLOFT) 25 MG tablet TAKE 1 TABLET(25 MG) BY MOUTH DAILY (Patient taking differently: Take 25 mg by mouth daily.)   vitamin C (ASCORBIC ACID) 500 MG tablet Take 500 mg by mouth every other day.   No facility-administered encounter medications on file as of 01/05/2021.    Patient Active Problem List   Diagnosis Date Noted   Tremor 01/06/2020   Age-related osteoporosis with current pathological fracture 11/05/2018   Grief reaction 03/31/2018   Hypertensive nephropathy 03/03/2018   Chronic renal disease, stage III ([place]) 12/11/2017   Hypertension, essential, benign 10/30/2017   Anxiety 10/11/2017   Memory disorder 09/04/2016   Headache 04/27/2015   Hypertension 01/16/2013   UTI (lower urinary tract infection) 01/16/2013    Conditions to be addressed/monitored: HTN, Chronic Renal Disease stage III, Hypertensive Nephropathy, osteoporosis  Care Plan : RN Care Manager Plan of Care  Updates made by Lynne Logan, RN since 01/05/2021 12:00 AM     Problem: No Plan of care established for management of  chronic disease states (HTN, Chronic Renal Disease stage III, Hypertensive Nephropathy, osteoporosis)   Priority: High     Long-Range Goal: Development of plan of care for chroinc disease management for HTN, Chronic Renal Disease stage III, Hypertensive Nephropathy, osteoporosis   Start Date: 11/23/2020  Expected End Date: 11/23/2021  Recent  Progress: On track  Priority: High  Note:   Current Barriers:  Knowledge Deficits related to plan of care for management of HTN, Chronic Renal Disease stage III, Hypertensive Nephropathy, osteoporosis Chronic Disease Management support and education needs related to HTN, Chronic Renal Disease stage III, Hypertensive Nephropathy, osteoporosis  RNCM Clinical Goal(s):  Patient will verbalize basic understanding of  HTN, Chronic Renal Disease stage III, Hypertensive Nephropathy, osteoporosis disease process and self health management plan   demonstrate Ongoing health management independence   continue to work with RN Care Manager to address care management and care coordination needs related to  HTN, Chronic Renal Disease stage III, Hypertensive Nephropathy, osteoporosis will demonstrate ongoing self health care management ability    through collaboration with RN Care manager, provider, and care team.   Interventions: 1:1 collaboration with primary care provider regarding development and update of comprehensive plan of care as evidenced by provider attestation and co-signature Inter-disciplinary care team collaboration (see longitudinal plan of care) Evaluation of current treatment plan related to  self management and patient's adherence to plan as established by provider  Hypertension Interventions: (Status:  Goal on Track. Yes) Objective:  Last practice recorded BP readings:  BP Readings from Last 3 Encounters:  01/01/21 114/78  12/20/20 (!) 172/68  11/29/20 112/70   Most recent eGFR/CrCl:  Lab Results  Component Value Date   EGFR 40 (L) 01/01/2021    No components found for: CRCL Review of patient status, including review of consultant's reports, relevant laboratory and other test results, and medications completed. Evaluation of current treatment plan related to hypertension self management and patient's adherence to plan as established by provider Provided education on prescribed  diet low Sodium Assessed social determinant of health barriers Discussed plans with patient for ongoing care management follow up and provided patient with direct contact information for care management team   Chronic Kidney Disease Interventions:  (Status:  Goal on track:  Yes.) Completed inbound call with daughter [name] and patient Evaluation of current treatment plan related to chronic kidney disease self management and patient's adherence to plan as established by provider      Reviewed prescribed diet increase water intake as discussed Provided education on kidney disease progression    Review of patient status, including review of consultant's reports, relevant laboratory and other test results, and medications completed. Discussed plans with patient for ongoing care management follow up and provided patient with direct contact information for care management team Lab Results  Component Value Date   EGFR 40 (L) 01/01/2021  Urinary Tract Infection Interventions:  (Status:  Goal on track:  Yes.)  Short Term Goal Evaluation of current treatment plan related to  UTI ,  self-management and patient's adherence to plan as established by provider Determined patient completed her full course of antibiotics for last UTI Review of patient status, including review of consultant's reports, relevant laboratory and other test results, and medications completed, determined last urinalysis indicated no indication of UTI Re-Educated patient regarding the importance to increase water intake as directed to help keep patient well hydrated and maintain/improve renal function Re-Educated patient about signs/symptoms suggestive of reoccurring UTI and when to seek medical attention  Discussed plans with patient for ongoing care management follow up and provided patient with direct contact information for care management team 01/05/21 completed inbound call with daughter Shuntavia Yerby Discussed concerns regarding an  incontinent episode Ms. Fabela experienced earlier in the week, although daughter reports her mother had increased her water intake on this day Determined patient and daughter would like a repeat urine sample to ensure no reoccurring UTI Sent in basket message to PCP provider Dr. [name] requesting a lab visit for urine check per daughter/patient request Discussed plans with patient for ongoing care management follow up and provided patient with direct contact information for care management team   Patient Goals/Self-Care Activities: Take all medications as prescribed Attend all scheduled provider appointments Call provider office for new concerns or questions  drink 6 to 8 glasses of water each day  Follow Up Plan:  Telephone follow up appointment with care management team member scheduled for:  03/06/21      Plan:Telephone follow up appointment with care management team member scheduled for:  03/06/21  Barb Merino, RN, BSN, CCM Care Management Coordinator [place] [facility]  Direct Phone: [phone]

## 2021-01-05 NOTE — Patient Instructions (Signed)
Visit Information   Thank you for taking time to visit with me today. Please don't hesitate to contact me if I can be of assistance to you before our next scheduled telephone appointment.  Following are the goals we discussed today:  (Copy and paste patient goals from clinical care plan here)  Our next appointment is by telephone on 03/06/21 at 10:20 AM  Please call the care guide team at [phone] if you need to cancel or reschedule your appointment.   If you are experiencing a Mental Health or [place] or need someone to talk to, please call 1-800-273-TALK (toll free, 24 hour hotline)   Following is a copy of your full care plan:  Care Plan : [place] of Care  Updates made by Lynne Logan, RN since 01/05/2021 12:00 AM     Problem: No Plan of care established for management of chronic disease states (HTN, Chronic Renal Disease stage III, Hypertensive Nephropathy, osteoporosis)   Priority: High     Long-Range Goal: Development of plan of care for chroinc disease management for HTN, Chronic Renal Disease stage III, Hypertensive Nephropathy, osteoporosis   Start Date: 11/23/2020  Expected End Date: 11/23/2021  Recent Progress: On track  Priority: High  Note:   Current Barriers:  Knowledge Deficits related to plan of care for management of HTN, Chronic Renal Disease stage III, Hypertensive Nephropathy, osteoporosis Chronic Disease Management support and education needs related to HTN, Chronic Renal Disease stage III, Hypertensive Nephropathy, osteoporosis  RNCM Clinical Goal(s):  Patient will verbalize basic understanding of  HTN, Chronic Renal Disease stage III, Hypertensive Nephropathy, osteoporosis disease process and self health management plan   demonstrate Ongoing health management independence   continue to work with RN Care Manager to address care management and care coordination needs related to  HTN, Chronic Renal Disease stage III,  Hypertensive Nephropathy, osteoporosis will demonstrate ongoing self health care management ability    through collaboration with RN Care manager, provider, and care team.   Interventions: 1:1 collaboration with primary care provider regarding development and update of comprehensive plan of care as evidenced by provider attestation and co-signature Inter-disciplinary care team collaboration (see longitudinal plan of care) Evaluation of current treatment plan related to  self management and patient's adherence to plan as established by provider  Hypertension Interventions: (Status:  Goal on Track. Yes) Objective:  Last practice recorded BP readings:  BP Readings from Last 3 Encounters:  01/01/21 114/78  12/20/20 (!) 172/68  11/29/20 112/70   Most recent eGFR/CrCl:  Lab Results  Component Value Date   EGFR 40 (L) 01/01/2021    No components found for: CRCL Review of patient status, including review of consultant's reports, relevant laboratory and other test results, and medications completed. Evaluation of current treatment plan related to hypertension self management and patient's adherence to plan as established by provider Provided education on prescribed diet low Sodium Assessed social determinant of health barriers Discussed plans with patient for ongoing care management follow up and provided patient with direct contact information for care management team   Chronic Kidney Disease Interventions:  (Status:  Goal on track:  Yes.) Completed inbound call with daughter [name] and patient Evaluation of current treatment plan related to chronic kidney disease self management and patient's adherence to plan as established by provider      Reviewed prescribed diet increase water intake as discussed Provided education on kidney disease progression    Review of patient status, including review of  consultant's reports, relevant laboratory and other test results, and medications  completed. Discussed plans with patient for ongoing care management follow up and provided patient with direct contact information for care management team Lab Results  Component Value Date   EGFR 40 (L) 01/01/2021  Urinary Tract Infection Interventions:  (Status:  Goal on track:  Yes.)  Short Term Goal Evaluation of current treatment plan related to  UTI ,  self-management and patient's adherence to plan as established by provider Determined patient completed her full course of antibiotics for last UTI Review of patient status, including review of consultant's reports, relevant laboratory and other test results, and medications completed, determined last urinalysis indicated no indication of UTI Re-Educated patient regarding the importance to increase water intake as directed to help keep patient well hydrated and maintain/improve renal function Re-Educated patient about signs/symptoms suggestive of reoccurring UTI and when to seek medical attention  Discussed plans with patient for ongoing care management follow up and provided patient with direct contact information for care management team 01/05/21 completed inbound call with daughter Annesha Delgreco Discussed concerns regarding an incontinent episode Ms. Ciaramitaro experienced earlier in the week, although daughter reports her mother had increased her water intake on this day Determined patient and daughter would like a repeat urine sample to ensure no reoccurring UTI Sent in basket message to PCP provider Dr. [name] requesting a lab visit for urine check per daughter/patient request Discussed plans with patient for ongoing care management follow up and provided patient with direct contact information for care management team   Patient Goals/Self-Care Activities: Take all medications as prescribed Attend all scheduled provider appointments Call provider office for new concerns or questions  drink 6 to 8 glasses of water each day  Follow  Up Plan:  Telephone follow up appointment with care management team member scheduled for:  03/06/21      Consent to CCM Services: Ms. Buster was given information about Chronic Care Management services including:  CCM service includes personalized support from designated clinical staff supervised by her physician, including individualized plan of care and coordination with other care providers 24/7 contact phone numbers for assistance for urgent and routine care needs. Service will only be billed when office clinical staff spend 20 minutes or more in a month to coordinate care. Only one practitioner may furnish and bill the service in a calendar month. The patient may stop CCM services at any time (effective at the end of the month) by phone call to the office staff. The patient will be responsible for cost sharing (co-pay) of up to 20% of the service fee (after annual deductible is met).  Patient agreed to services and verbal consent obtained.   The patient verbalized understanding of instructions, educational materials, and care plan provided today and declined offer to receive copy of patient instructions, educational materials, and care plan.   Telephone follow up appointment with care management team member scheduled for: 03/06/21

## 2021-01-08 ENCOUNTER — Ambulatory Visit: Payer: Self-pay

## 2021-01-08 ENCOUNTER — Other Ambulatory Visit: Payer: Self-pay

## 2021-01-08 ENCOUNTER — Other Ambulatory Visit (INDEPENDENT_AMBULATORY_CARE_PROVIDER_SITE_OTHER): Payer: Medicare Other

## 2021-01-08 ENCOUNTER — Telehealth: Payer: Medicare Other

## 2021-01-08 DIAGNOSIS — R82998 Other abnormal findings in urine: Secondary | ICD-10-CM | POA: Diagnosis not present

## 2021-01-08 DIAGNOSIS — M810 Age-related osteoporosis without current pathological fracture: Secondary | ICD-10-CM

## 2021-01-08 DIAGNOSIS — I10 Essential (primary) hypertension: Secondary | ICD-10-CM

## 2021-01-08 DIAGNOSIS — R350 Frequency of micturition: Secondary | ICD-10-CM

## 2021-01-08 DIAGNOSIS — I129 Hypertensive chronic kidney disease with stage 1 through stage 4 chronic kidney disease, or unspecified chronic kidney disease: Secondary | ICD-10-CM

## 2021-01-08 DIAGNOSIS — N1832 Chronic kidney disease, stage 3b: Secondary | ICD-10-CM

## 2021-01-08 LAB — POCT URINALYSIS DIPSTICK
Bilirubin, UA: NEGATIVE
Blood, UA: NEGATIVE
Glucose, UA: NEGATIVE
Ketones, UA: NEGATIVE
Nitrite, UA: NEGATIVE
Protein, UA: NEGATIVE
Spec Grav, UA: 1.015 (ref 1.010–1.025)
Urobilinogen, UA: 0.2 E.U./dL
pH, UA: 6.5 (ref 5.0–8.0)

## 2021-01-08 NOTE — Chronic Care Management (AMB) (Signed)
Chronic Care Management   CCM RN Visit Note  01/08/2021 Name: Whitney Henderson MRN: [id number] DOB: [birth year]  Subjective: Whitney Henderson is a 87 y.o. year old female who is a primary care patient of [name], MD. The care management team was consulted for assistance with disease management and care coordination needs.    Engaged with patient by telephone for follow up visit in response to provider referral for case management and/or care coordination services.   Consent to Services:  The patient was given information about Chronic Care Management services, agreed to services, and gave verbal consent prior to initiation of services.  Please see initial visit note for detailed documentation.   Patient agreed to services and verbal consent obtained.   Assessment: Review of patient past medical history, allergies, medications, health status, including review of consultants reports, laboratory and other test data, was performed as part of comprehensive evaluation and provision of chronic care management services.   SDOH (Social Determinants of Health) assessments and interventions performed:    CCM Care Plan  Allergies  Allergen Reactions   Codeine Rash    Outpatient Encounter Medications as of 01/08/2021  Medication Sig   acetaminophen (TYLENOL) 500 MG tablet Take 500 mg by mouth every 6 (six) hours as needed for mild pain or moderate pain.   amLODipine (NORVASC) 2.5 MG tablet TAKE 1 TABLET(2.5 MG) BY MOUTH TWICE DAILY (Patient taking differently: Take 2.5 mg by mouth 2 (two) times daily.)   Apoaequorin (PREVAGEN PO) Take 1 capsule by mouth every other day.   ASPIRIN 81 PO Take 81 mg by mouth as needed (for pain or headache).   B Complex Vitamins (B COMPLEX PO) Take 1 capsule by mouth every other day.   betamethasone dipropionate (DIPROLENE) 0.05 % cream APPLY THIN LAYER EXTERNALLY TO THE AFFECTED AREA EVERY DAY AS NEEDED (Patient taking differently: 1 application daily as  needed (skin irritation).)   Calcium Carb-Cholecalciferol (CALCIUM 500 +D PO) Take 1 tablet by mouth every other day.   cephALEXin (KEFLEX) 500 MG capsule Take 1 capsule (500 mg total) by mouth 4 (four) times daily.   donepezil (ARICEPT) 5 MG tablet TAKE 1 TABLET(5 MG) BY MOUTH AT BEDTIME (Patient taking differently: Take 5 mg by mouth at bedtime.)   Multiple Vitamins-Minerals (CENTRUM SILVER PO) Take 1 tablet by mouth every other day.   Propylene Glycol (SYSTANE BALANCE OP) Place 1 drop into both eyes daily as needed (for itchy eyes).   sertraline (ZOLOFT) 25 MG tablet TAKE 1 TABLET(25 MG) BY MOUTH DAILY (Patient taking differently: Take 25 mg by mouth daily.)   vitamin C (ASCORBIC ACID) 500 MG tablet Take 500 mg by mouth every other day.   No facility-administered encounter medications on file as of 01/08/2021.    Patient Active Problem List   Diagnosis Date Noted   Tremor 01/06/2020   Age-related osteoporosis with current pathological fracture 11/05/2018   Grief reaction 03/31/2018   Hypertensive nephropathy 03/03/2018   Chronic renal disease, stage III ([place]) 12/11/2017   Hypertension, essential, benign 10/30/2017   Anxiety 10/11/2017   Memory disorder 09/04/2016   Headache 04/27/2015   Hypertension 01/16/2013   UTI (lower urinary tract infection) 01/16/2013    Conditions to be addressed/monitored: HTN, Chronic Renal Disease stage III, Hypertensive Nephropathy, osteoporosis  Care Plan : RN Care Manager Plan of Care  Updates made by Lynne Logan, RN since 01/08/2021 12:00 AM     Problem: No Plan of care established for management of  chronic disease states (HTN, Chronic Renal Disease stage III, Hypertensive Nephropathy, osteoporosis)   Priority: High     Long-Range Goal: Development of plan of care for chroinc disease management for HTN, Chronic Renal Disease stage III, Hypertensive Nephropathy, osteoporosis   Start Date: 11/23/2020  Expected End Date: 11/23/2021  Recent  Progress: On track  Priority: High  Note:   Current Barriers:  Knowledge Deficits related to plan of care for management of HTN, Chronic Renal Disease stage III, Hypertensive Nephropathy, osteoporosis Chronic Disease Management support and education needs related to HTN, Chronic Renal Disease stage III, Hypertensive Nephropathy, osteoporosis  RNCM Clinical Goal(s):  Patient will verbalize basic understanding of  HTN, Chronic Renal Disease stage III, Hypertensive Nephropathy, osteoporosis disease process and self health management plan   demonstrate Ongoing health management independence   continue to work with RN Care Manager to address care management and care coordination needs related to  HTN, Chronic Renal Disease stage III, Hypertensive Nephropathy, osteoporosis will demonstrate ongoing self health care management ability    through collaboration with RN Care manager, provider, and care team.   Interventions: 1:1 collaboration with primary care provider regarding development and update of comprehensive plan of care as evidenced by provider attestation and co-signature Inter-disciplinary care team collaboration (see longitudinal plan of care) Evaluation of current treatment plan related to  self management and patient's adherence to plan as established by provider  Hypertension Interventions: (Status:  Condition stable.  Not addressed this visit.) Objective:  Last practice recorded BP readings:  BP Readings from Last 3 Encounters:  01/01/21 114/78  12/20/20 (!) 172/68  11/29/20 112/70   Most recent eGFR/CrCl:  Lab Results  Component Value Date   EGFR 40 (L) 01/01/2021    No components found for: CRCL Review of patient status, including review of consultant's reports, relevant laboratory and other test results, and medications completed. Evaluation of current treatment plan related to hypertension self management and patient's adherence to plan as established by provider Provided  education on prescribed diet low Sodium Assessed social determinant of health barriers Discussed plans with patient for ongoing care management follow up and provided patient with direct contact information for care management team   Chronic Kidney Disease Interventions:  (Status:  Condition stable.  Not addressed this visit.) Completed inbound call with daughter [name] and patient Evaluation of current treatment plan related to chronic kidney disease self management and patient's adherence to plan as established by provider      Reviewed prescribed diet increase water intake as discussed Provided education on kidney disease progression    Review of patient status, including review of consultant's reports, relevant laboratory and other test results, and medications completed. Discussed plans with patient for ongoing care management follow up and provided patient with direct contact information for care management team Lab Results  Component Value Date   EGFR 40 (L) 01/01/2021  Urinary Tract Infection Interventions:  (Status:  Goal on track:  Yes.)  Short Term Goal Evaluation of current treatment plan related to  UTI ,  self-management and patient's adherence to plan as established by provider Determined patient completed her full course of antibiotics for last UTI Review of patient status, including review of consultant's reports, relevant laboratory and other test results, and medications completed, determined last urinalysis indicated no indication of UTI Re-Educated patient regarding the importance to increase water intake as directed to help keep patient well hydrated and maintain/improve renal function Re-Educated patient about signs/symptoms suggestive of reoccurring UTI and when  to seek medical attention  Discussed plans with patient for ongoing care management follow up and provided patient with direct contact information for care management team 01/05/21 completed inbound call with  daughter Erricka Falkner Discussed concerns regarding an incontinent episode Ms. Zeis experienced earlier in the week, although daughter reports her mother had increased her water intake on this day Determined patient and daughter would like a repeat urine sample to ensure no reoccurring UTI Sent in basket message to PCP provider Dr. [name] requesting a lab visit for urine check per daughter/patient request Discussed plans with patient for ongoing care management follow up and provided patient with direct contact information for care management team 01/08/21 completed successful outbound call with daughter Danielle Rankin  Message received from Dr. Baird Cancer PCP advising patient may come into the office for a urine check today  Placed outbound call to daughter Judit Awad to advise patient may come into the office today per Dr. Baird Cancer for a urine recheck, discussed the times available and determined patient will come in today from 2-4 PM Sent in basket message to Dr. Baird Cancer to make her aware of the time patient prefers to come in for a urine recheck Discussed plans with patient for ongoing care management follow up and provided patient with direct contact information for care management team  Patient Goals/Self-Care Activities: Take all medications as prescribed Attend all scheduled provider appointments Call provider office for new concerns or questions  drink 6 to 8 glasses of water each day  Follow Up Plan:  Telephone follow up appointment with care management team member scheduled for:  03/06/21      Plan:Telephone follow up appointment with care management team member scheduled for:  03/06/21  Barb Merino, RN, BSN, CCM Care Management Coordinator [facility]  Direct Phone: [phone]

## 2021-01-08 NOTE — Patient Instructions (Addendum)
Visit Information   Thank you for taking time to visit with me today. Please don't hesitate to contact me if I can be of assistance to you before our next scheduled telephone appointment.  Following are the goals we discussed today:  (Copy and paste patient goals from clinical care plan here)  Our next appointment is by telephone on 03/06/21 at 10:20 AM   Please call the care guide team at [phone] if you need to cancel or reschedule your appointment.   If you are experiencing a Mental Health or [place] or need someone to talk to, please call 1-800-273-TALK (toll free, 24 hour hotline)   Following is a copy of your full care plan:  Care Plan : [place] of Care  Updates made by Lynne Logan, RN since 01/08/2021 12:00 AM     Problem: No Plan of care established for management of chronic disease states (HTN, Chronic Renal Disease stage III, Hypertensive Nephropathy, osteoporosis)   Priority: High     Long-Range Goal: Development of plan of care for chroinc disease management for HTN, Chronic Renal Disease stage III, Hypertensive Nephropathy, osteoporosis   Start Date: 11/23/2020  Expected End Date: 11/23/2021  Recent Progress: On track  Priority: High  Note:   Current Barriers:  Knowledge Deficits related to plan of care for management of HTN, Chronic Renal Disease stage III, Hypertensive Nephropathy, osteoporosis Chronic Disease Management support and education needs related to HTN, Chronic Renal Disease stage III, Hypertensive Nephropathy, osteoporosis  RNCM Clinical Goal(s):  Patient will verbalize basic understanding of  HTN, Chronic Renal Disease stage III, Hypertensive Nephropathy, osteoporosis disease process and self health management plan   demonstrate Ongoing health management independence   continue to work with RN Care Manager to address care management and care coordination needs related to  HTN, Chronic Renal Disease stage III,  Hypertensive Nephropathy, osteoporosis will demonstrate ongoing self health care management ability    through collaboration with RN Care manager, provider, and care team.   Interventions: 1:1 collaboration with primary care provider regarding development and update of comprehensive plan of care as evidenced by provider attestation and co-signature Inter-disciplinary care team collaboration (see longitudinal plan of care) Evaluation of current treatment plan related to  self management and patient's adherence to plan as established by provider  Hypertension Interventions: (Status:  Condition stable.  Not addressed this visit.) Objective:  Last practice recorded BP readings:  BP Readings from Last 3 Encounters:  01/01/21 114/78  12/20/20 (!) 172/68  11/29/20 112/70   Most recent eGFR/CrCl:  Lab Results  Component Value Date   EGFR 40 (L) 01/01/2021    No components found for: CRCL Review of patient status, including review of consultant's reports, relevant laboratory and other test results, and medications completed. Evaluation of current treatment plan related to hypertension self management and patient's adherence to plan as established by provider Provided education on prescribed diet low Sodium Assessed social determinant of health barriers Discussed plans with patient for ongoing care management follow up and provided patient with direct contact information for care management team   Chronic Kidney Disease Interventions:  (Status:  Condition stable.  Not addressed this visit.) Completed inbound call with daughter [name] and patient Evaluation of current treatment plan related to chronic kidney disease self management and patient's adherence to plan as established by provider      Reviewed prescribed diet increase water intake as discussed Provided education on kidney disease progression    Review  of patient status, including review of consultant's reports, relevant laboratory and  other test results, and medications completed. Discussed plans with patient for ongoing care management follow up and provided patient with direct contact information for care management team Lab Results  Component Value Date   EGFR 40 (L) 01/01/2021  Urinary Tract Infection Interventions:  (Status:  Condition Stable, no discussed during this encounter)  Short Term Goal Evaluation of current treatment plan related to  UTI ,  self-management and patient's adherence to plan as established by provider Determined patient completed her full course of antibiotics for last UTI Review of patient status, including review of consultant's reports, relevant laboratory and other test results, and medications completed, determined last urinalysis indicated no indication of UTI Re-Educated patient regarding the importance to increase water intake as directed to help keep patient well hydrated and maintain/improve renal function Re-Educated patient about signs/symptoms suggestive of reoccurring UTI and when to seek medical attention  Discussed plans with patient for ongoing care management follow up and provided patient with direct contact information for care management team 01/05/21 completed inbound call with daughter Kaetlin Bullen Discussed concerns regarding an incontinent episode Ms. Wyre experienced earlier in the week, although daughter reports her mother had increased her water intake on this day Determined patient and daughter would like a repeat urine sample to ensure no reoccurring UTI Sent in basket message to PCP provider Dr. [name] requesting a lab visit for urine check per daughter/patient request Discussed plans with patient for ongoing care management follow up and provided patient with direct contact information for care management team 01/08/21 completed successful outbound call with daughter Danielle Rankin  Message received from Dr. Baird Cancer PCP advising patient may come into the office for  a urine check today  Placed outbound call to daughter Lindy Garczynski to advise patient may come into the office today per Dr. Baird Cancer for a urine recheck, discussed the times available and determined patient will come in today from 2-4 PM Sent in basket message to Dr. Baird Cancer to make her aware of the time patient prefers to come in for a urine recheck Discussed plans with patient for ongoing care management follow up and provided patient with direct contact information for care management team  Patient Goals/Self-Care Activities: Take all medications as prescribed Attend all scheduled provider appointments Call provider office for new concerns or questions  drink 6 to 8 glasses of water each day  Follow Up Plan:  Telephone follow up appointment with care management team member scheduled for:  03/06/21      Consent to CCM Services: Ms. Cressler was given information about Chronic Care Management services including:  CCM service includes personalized support from designated clinical staff supervised by her physician, including individualized plan of care and coordination with other care providers 24/7 contact phone numbers for assistance for urgent and routine care needs. Service will only be billed when office clinical staff spend 20 minutes or more in a month to coordinate care. Only one practitioner may furnish and bill the service in a calendar month. The patient may stop CCM services at any time (effective at the end of the month) by phone call to the office staff. The patient will be responsible for cost sharing (co-pay) of up to 20% of the service fee (after annual deductible is met).  Patient agreed to services and verbal consent obtained.   The patient verbalized understanding of instructions, educational materials, and care plan provided today and declined offer to receive  copy of patient instructions, educational materials, and care plan.   Telephone follow up appointment with care  management team member scheduled for: 03/06/21

## 2021-01-09 DIAGNOSIS — R7309 Other abnormal glucose: Secondary | ICD-10-CM | POA: Diagnosis not present

## 2021-01-09 DIAGNOSIS — I129 Hypertensive chronic kidney disease with stage 1 through stage 4 chronic kidney disease, or unspecified chronic kidney disease: Secondary | ICD-10-CM | POA: Diagnosis not present

## 2021-01-09 DIAGNOSIS — W19XXXD Unspecified fall, subsequent encounter: Secondary | ICD-10-CM | POA: Diagnosis not present

## 2021-01-09 DIAGNOSIS — N1832 Chronic kidney disease, stage 3b: Secondary | ICD-10-CM | POA: Diagnosis not present

## 2021-01-11 LAB — URINE CULTURE

## 2021-01-16 DIAGNOSIS — I129 Hypertensive chronic kidney disease with stage 1 through stage 4 chronic kidney disease, or unspecified chronic kidney disease: Secondary | ICD-10-CM | POA: Diagnosis not present

## 2021-01-16 DIAGNOSIS — W19XXXD Unspecified fall, subsequent encounter: Secondary | ICD-10-CM | POA: Diagnosis not present

## 2021-01-16 DIAGNOSIS — N1832 Chronic kidney disease, stage 3b: Secondary | ICD-10-CM | POA: Diagnosis not present

## 2021-01-16 DIAGNOSIS — R7309 Other abnormal glucose: Secondary | ICD-10-CM | POA: Diagnosis not present

## 2021-01-18 ENCOUNTER — Telehealth: Payer: Self-pay

## 2021-01-18 ENCOUNTER — Telehealth: Payer: Medicare Other

## 2021-01-18 NOTE — Telephone Encounter (Signed)
°  Care Management   Follow Up Note   01/18/2021 Name: Whitney Henderson MRN: [id number] DOB: 12-Nov-1928   Referred by: [name], MD Reason for referral : Chronic Care Management (Unsuccessful call)   An unsuccessful telephone outreach was attempted today. The patient was referred to the case management team for assistance with care management and care coordination. HIPAA compliant voice message left requesting a return call.  Follow Up Plan: The care management team will reach out to the patient again over the next 21 days.   Daneen Schick, BSW, CDP Social Worker, Certified Dementia Practitioner [facility] [phone]

## 2021-01-20 DIAGNOSIS — I129 Hypertensive chronic kidney disease with stage 1 through stage 4 chronic kidney disease, or unspecified chronic kidney disease: Secondary | ICD-10-CM | POA: Diagnosis not present

## 2021-01-20 DIAGNOSIS — I10 Essential (primary) hypertension: Secondary | ICD-10-CM | POA: Diagnosis not present

## 2021-01-20 DIAGNOSIS — M810 Age-related osteoporosis without current pathological fracture: Secondary | ICD-10-CM

## 2021-01-20 DIAGNOSIS — N183 Chronic kidney disease, stage 3 unspecified: Secondary | ICD-10-CM

## 2021-01-25 DIAGNOSIS — I129 Hypertensive chronic kidney disease with stage 1 through stage 4 chronic kidney disease, or unspecified chronic kidney disease: Secondary | ICD-10-CM | POA: Diagnosis not present

## 2021-01-25 DIAGNOSIS — R7309 Other abnormal glucose: Secondary | ICD-10-CM | POA: Diagnosis not present

## 2021-01-25 DIAGNOSIS — N1832 Chronic kidney disease, stage 3b: Secondary | ICD-10-CM | POA: Diagnosis not present

## 2021-01-25 DIAGNOSIS — W19XXXD Unspecified fall, subsequent encounter: Secondary | ICD-10-CM | POA: Diagnosis not present

## 2021-01-30 DIAGNOSIS — N1832 Chronic kidney disease, stage 3b: Secondary | ICD-10-CM | POA: Diagnosis not present

## 2021-01-30 DIAGNOSIS — R7309 Other abnormal glucose: Secondary | ICD-10-CM | POA: Diagnosis not present

## 2021-01-30 DIAGNOSIS — W19XXXD Unspecified fall, subsequent encounter: Secondary | ICD-10-CM | POA: Diagnosis not present

## 2021-01-30 DIAGNOSIS — I129 Hypertensive chronic kidney disease with stage 1 through stage 4 chronic kidney disease, or unspecified chronic kidney disease: Secondary | ICD-10-CM | POA: Diagnosis not present

## 2021-01-31 ENCOUNTER — Ambulatory Visit (INDEPENDENT_AMBULATORY_CARE_PROVIDER_SITE_OTHER): Payer: Medicare Other

## 2021-01-31 DIAGNOSIS — I10 Essential (primary) hypertension: Secondary | ICD-10-CM

## 2021-01-31 DIAGNOSIS — I129 Hypertensive chronic kidney disease with stage 1 through stage 4 chronic kidney disease, or unspecified chronic kidney disease: Secondary | ICD-10-CM

## 2021-01-31 DIAGNOSIS — N1832 Chronic kidney disease, stage 3b: Secondary | ICD-10-CM

## 2021-01-31 NOTE — Chronic Care Management (AMB) (Signed)
Chronic Care Management    Social Work Note  01/31/2021 Name: Whitney Henderson MRN: [id number] DOB: 12/29/1928  Whitney Henderson is a 88 y.o. year old female who is a primary care patient of [name], MD. The CCM team was consulted to assist the patient with chronic disease management and/or care coordination needs related to:  HTN, Chronic Renal Disease, Hypertensive Nephropathy .   Engaged with the patients daughter [name] by phone  for follow up visit in response to provider referral for social work chronic care management and care coordination services.   Consent to Services:  The patient was given information about Chronic Care Management services, agreed to services, and gave verbal consent prior to initiation of services.  Please see initial visit note for detailed documentation.   Patient agreed to services and consent obtained.   Assessment: Review of patient past medical history, allergies, medications, and health status, including review of relevant consultants reports was performed today as part of a comprehensive evaluation and provision of chronic care management and care coordination services.     SDOH (Social Determinants of Health) assessments and interventions performed:    Advanced Directives Status: Not addressed in this encounter.  CCM Care Plan  Allergies  Allergen Reactions   Codeine Rash    Outpatient Encounter Medications as of 01/31/2021  Medication Sig   acetaminophen (TYLENOL) 500 MG tablet Take 500 mg by mouth every 6 (six) hours as needed for mild pain or moderate pain.   amLODipine (NORVASC) 2.5 MG tablet TAKE 1 TABLET(2.5 MG) BY MOUTH TWICE DAILY (Patient taking differently: Take 2.5 mg by mouth 2 (two) times daily.)   Apoaequorin (PREVAGEN PO) Take 1 capsule by mouth every other day.   ASPIRIN 81 PO Take 81 mg by mouth as needed (for pain or headache).   B Complex Vitamins (B COMPLEX PO) Take 1 capsule by mouth every other day.   betamethasone  dipropionate (DIPROLENE) 0.05 % cream APPLY THIN LAYER EXTERNALLY TO THE AFFECTED AREA EVERY DAY AS NEEDED (Patient taking differently: 1 application daily as needed (skin irritation).)   Calcium Carb-Cholecalciferol (CALCIUM 500 +D PO) Take 1 tablet by mouth every other day.   cephALEXin (KEFLEX) 500 MG capsule Take 1 capsule (500 mg total) by mouth 4 (four) times daily.   donepezil (ARICEPT) 5 MG tablet TAKE 1 TABLET(5 MG) BY MOUTH AT BEDTIME (Patient taking differently: Take 5 mg by mouth at bedtime.)   Multiple Vitamins-Minerals (CENTRUM SILVER PO) Take 1 tablet by mouth every other day.   Propylene Glycol (SYSTANE BALANCE OP) Place 1 drop into both eyes daily as needed (for itchy eyes).   sertraline (ZOLOFT) 25 MG tablet TAKE 1 TABLET(25 MG) BY MOUTH DAILY (Patient taking differently: Take 25 mg by mouth daily.)   vitamin C (ASCORBIC ACID) 500 MG tablet Take 500 mg by mouth every other day.   No facility-administered encounter medications on file as of 01/31/2021.    Patient Active Problem List   Diagnosis Date Noted   Tremor 01/06/2020   Age-related osteoporosis with current pathological fracture 11/05/2018   Grief reaction 03/31/2018   Hypertensive nephropathy 03/03/2018   Chronic renal disease, stage III ([place]) 12/11/2017   Hypertension, essential, benign 10/30/2017   Anxiety 10/11/2017   Memory disorder 09/04/2016   Headache 04/27/2015   Hypertension 01/16/2013   UTI (lower urinary tract infection) 01/16/2013    Conditions to be addressed/monitored:  HTN, Chronic Renal Disease, Hypertensive Nephropathy ; ADL IADL limitations  Care Plan :  Social Work Plan of Care  Updates made by Daneen Schick since 01/31/2021 12:00 AM     Problem: Mobility and Independence      Goal: Obtain a caregiver in the home   Start Date: 12/28/2020  This Visit's Progress: On track  Priority: High  Note:   Current Barriers:  Chronic disease management support and education needs related to  HTN,  Chronic Renal Disease stage III, Hypertensive Nephropathy   Recent admission due to UTI Limited access to a caregiver Social Worker Clinical Goal(s):  patient will work with SW to identify and address any acute and/or chronic care coordination needs related to the self health management of  HTN, Chronic renal disease stage III, hypertensive nephropathy   Patient will follow up with long term care insurance policy as directed by SW SW Interventions:  Inter-disciplinary care team collaboration (see longitudinal plan of care) Collaboration with [name], MD regarding development and update of comprehensive plan of care as evidenced by provider attestation and co-signature Successful outbound call placed to the patients daughter [name] to assess goal progression Discussed the patient was advised she would need to pay for a caregiver privately and then submit a claim to her long term care insurance plan for reimbursement Determined the patient has hired a caregiver through [facility] The caregiver started yesterday and will come to the home 2 days a week every other week at this time Scheduled follow up call over the next month to confirm caregiver agency is a good fit  Patient Goals/Self-Care Activities patient will: with the help of her daughter  -  Engage with [facility] regarding caregiver needs -Contact SW as needed prior to next scheduled call  Follow Up Plan:  SW will follow up with the patient and her daughter over the next month       Follow Up Plan: SW will follow up with patient by phone over the next month      Daneen Schick, BSW, CDP Social Worker, Certified Dementia Practitioner [place] [facility] [phone]

## 2021-01-31 NOTE — Patient Instructions (Signed)
Social Worker Visit Information  Goals we discussed today:   Goals Addressed             This Visit's Progress    Obtain a Caregiver in the Home   On track    Timeframe:  Short-Term Goal Priority:  High Start Date: 12.8.22                             Next planned outreach: 2.9.23          Patient Goals/Self-Care Activities patient will: with the help of her daughter  - Engage with [facility] regarding caregiver needs -Contact SW as needed prior to next scheduled call          Materials Provided: No, patient declined   Follow Up Plan: SW will follow up with patient by phone over the next month  Daneen Schick, BSW, CDP Social Worker, Certified Dementia Practitioner [place] [facility] [place] [facility] [phone]

## 2021-02-05 DIAGNOSIS — R7309 Other abnormal glucose: Secondary | ICD-10-CM | POA: Diagnosis not present

## 2021-02-05 DIAGNOSIS — N1832 Chronic kidney disease, stage 3b: Secondary | ICD-10-CM | POA: Diagnosis not present

## 2021-02-05 DIAGNOSIS — W19XXXD Unspecified fall, subsequent encounter: Secondary | ICD-10-CM | POA: Diagnosis not present

## 2021-02-05 DIAGNOSIS — I129 Hypertensive chronic kidney disease with stage 1 through stage 4 chronic kidney disease, or unspecified chronic kidney disease: Secondary | ICD-10-CM | POA: Diagnosis not present

## 2021-02-06 ENCOUNTER — Telehealth: Payer: Self-pay

## 2021-02-06 NOTE — Chronic Care Management (AMB) (Addendum)
°  Whitney Henderson was reminded to have all medications, supplements and any blood glucose and blood pressure readings available for review with [name], Pharm. D, at her telephone visit on 02-09-2021 at 2:00.  Whitney Henderson was reminded to have all medications, supplements and any blood glucose and blood pressure readings available for review with [name], Pharm. D, at her telephone visit on 02-16-2021 at 10:00.  Questions: Have you had any recent office visit or specialist visit outside of [place]? Patient stated no.  Are there any concerns you would like to discuss during your office visit? Patient stated no.  Are you having any problems obtaining your medications? (Whether it pharmacy issues or cost) Patient stated no.  If patient has any PAP medications ask if they are having any problems getting their PAP medication or refill? No PAP medication   02-13-2021: Patient's 02-09-2021 appointment was rescheduled to 02-16-2021 at 10:00.  Care Gaps: AWV 07-19-2021 Covid booster overdue Flu vaccine overdue  Star Rating Drug: None  Any gaps in medications fill history? No    [place] Pharmacist Assistant [phone]

## 2021-02-09 ENCOUNTER — Telehealth: Payer: Medicare Other

## 2021-02-16 ENCOUNTER — Ambulatory Visit: Payer: Medicare Other

## 2021-02-16 DIAGNOSIS — F419 Anxiety disorder, unspecified: Secondary | ICD-10-CM

## 2021-02-16 DIAGNOSIS — N1832 Chronic kidney disease, stage 3b: Secondary | ICD-10-CM | POA: Diagnosis not present

## 2021-02-16 DIAGNOSIS — I129 Hypertensive chronic kidney disease with stage 1 through stage 4 chronic kidney disease, or unspecified chronic kidney disease: Secondary | ICD-10-CM | POA: Diagnosis not present

## 2021-02-16 DIAGNOSIS — R7309 Other abnormal glucose: Secondary | ICD-10-CM | POA: Diagnosis not present

## 2021-02-16 DIAGNOSIS — W19XXXD Unspecified fall, subsequent encounter: Secondary | ICD-10-CM | POA: Diagnosis not present

## 2021-02-16 DIAGNOSIS — I10 Essential (primary) hypertension: Secondary | ICD-10-CM

## 2021-02-16 NOTE — Patient Instructions (Signed)
Visit Information It was great speaking with you today!  Please let me know if you have any questions about our visit.   Goals Addressed             This Visit's Progress    Manage My Medicine       Timeframe:  Long-Range Goal Priority:  High Start Date:                             Expected End Date:                       Follow Up Date 05/25/2021  In Progress:   - call for medicine refill 2 or 3 days before it runs out - keep a list of all the medicines I take; vitamins and herbals too - use a pillbox to sort medicine - use an alarm clock or phone to remind me to take my medicine    Why is this important?   These steps will help you keep on track with your medicines. Please call if you have any questions about your medication         Patient Care Plan: CCM Pharmacy Care Plan     Problem Identified: HTN, Anxiety   Priority: High     Long-Range Goal: Disease Management   This Visit's Progress: On track  Recent Progress: Not on track  Priority: High  Note:   Current Barriers:  Unable to achieve control of anxiety    Pharmacist Clinical Goal(s):  Patient will achieve control of Anxiety as evidenced by a decrease in GAD-7 maintain control of hypertension as evidenced by BP readings of less than 140/90  through collaboration with PharmD and provider.   Interventions: 1:1 collaboration with Whitney Chard, MD regarding development and update of comprehensive plan of care as evidenced by provider attestation and co-signature Inter-disciplinary care team collaboration (see longitudinal plan of care) Comprehensive medication review performed; medication list updated in electronic medical record  Hypertension (BP goal <140/90) -Controlled -Current treatment: Amlodipine 2.5 mg tablet twice per day Appropriate, Effective, Safe, Accessible -Current home readings: BP readings have been very good. 153/73 - due to someone coming in as a caregiver and she was excited,  before then it is usually 123/64, 133/83 -Current dietary habits: Patient reports that she eating good. Patient is eating plenty of vegetables, and fruit and she has cut back on breads, and fried fatty foods. She is eating baked chicken  -She is trying increase the amount of water she is drinking, about 5-6 glasses per day, she is filling up a pitcher and then drinking it  -Current exercise habits: the physical therapist has shown Whitney Henderson exercises to do and now the caregiver is doing the exercises with her. Every other Tuesday and Thursday, and exercising with Whitney Henderson and walking the track and doing exercise at with the caregiver  -Denies hypotensive/hypertensive symptoms -Educated on Daily salt intake goal < 2300 mg; Symptoms of hypotension and importance of maintaining adequate hydration; -Counseled to monitor BP at home at , document, and provide log at future appointments -Recommended to continue current medication  Anxiety (Goal: ) -Controlled -Current treatment: Sertraline 25 mg tablet once per day Appropriate, Effective, Safe, Accessible -GAD7: 8 -Connected with Dr. Baird Cancer for a referral for mental health support -Patient went to a therapist and was told that Whitney Henderson did not need any therapy in 2019  -This happened  when her husband passed, and although the doctor recommended someone but she never received the information.  -Educated on Benefits of medication for symptom control Benefits of cognitive-behavioral therapy with or without medication -Recommended to continue current medication -Collaborate with PCP team to help patient for therapy scheduling, they would prefer it to be in person.   Patient Goals/Self-Care Activities Patient will:  - take medications as prescribed as evidenced by patient report and record review  Follow Up Plan: The patient has been provided with contact information for the care management team and has been advised to call with any health related  questions or concerns.      Patient agreed to services and verbal consent obtained.   The patient verbalized understanding of instructions, educational materials, and care plan provided today and agreed to receive a mailed copy of patient instructions, educational materials, and care plan.   Whitney Henderson, PharmD Clinical Pharmacist [facility] [phone]

## 2021-02-16 NOTE — Progress Notes (Signed)
Chronic Care Management Pharmacy Note  02/16/2021 Name:  Whitney Henderson MRN:  [id number] DOB:  01/18/1929  Summary: Whitney Henderson reports that sometimes she is having issues with her mood.   Recommendations/Changes made from today's visit: Recommend Whitney Henderson have a referral for a therapist in person.   Plan: Collaborate with PCP team to have referral and in - person therapy/counseling sessions.    Subjective: Whitney Henderson is an 88 y.o. year old female who is a primary Whitney Henderson of [name], MD.  The CCM team was consulted for assistance with disease management and care coordination needs.    Engaged with Whitney Henderson by telephone for follow up visit in response to provider referral for pharmacy case management and/or care coordination services.   Consent to Services:  The Whitney Henderson was given information about Chronic Care Management services, agreed to services, and gave verbal consent prior to initiation of services.  Please see initial visit note for detailed documentation.   Whitney Henderson Care Team: [name], MD as PCP - General (Internal Medicine) Monna Fam, MD as Consulting Physician (Ophthalmology) Lynne Logan, RN as Case Manager Mayford Knife, [facility] (Pharmacist)  Recent office visits: 01/01/2021 PCP OV   Recent consult visits: 09/28/2020 [facility] visits: None in previous 6 months   Objective:  Lab Results  Component Value Date   CREATININE 1.27 (H) 01/01/2021   BUN 27 01/01/2021   EGFR 40 (L) 01/01/2021   GFRNONAA 54 (L) 12/20/2020   GFRAA 40 (L) 02/28/2020   NA 139 01/01/2021   K 4.5 01/01/2021   CALCIUM 9.5 01/01/2021   CO2 21 01/01/2021   GLUCOSE 77 01/01/2021    Lab Results  Component Value Date/Time   HGBA1C 6.0 (H) 01/01/2021 11:39 AM   HGBA1C 5.8 (H) 12/11/2020 03:53 PM   MICROALBUR 150 02/28/2020 12:49 PM   MICROALBUR 10 02/18/2019 01:02 PM    Last diabetic Eye exam:  Lab Results  Component Value Date/Time    HMDIABEYEEXA Retinopathy (A) 12/15/2017 12:00 AM    Last diabetic Foot exam: No results found for: HMDIABFOOTEX   Lab Results  Component Value Date   CHOL 238 (H) 02/28/2020   HDL 94 02/28/2020   LDLCALC 131 (H) 02/28/2020   TRIG 77 02/28/2020   CHOLHDL 2.5 02/28/2020    Hepatic Function Latest Ref Rng & Units 12/20/2020 04/25/2020 02/28/2020  Total Protein 6.5 - 8.1 g/dL 7.1 6.8 7.8  Albumin 3.5 - 5.0 g/dL 3.2(L) 3.4(L) 4.5  AST 15 - 41 U/L 43(H) 21 23  ALT 0 - 44 U/L 19 12 9   Alk Phosphatase 38 - 126 U/L 59 54 75  Total Bilirubin 0.3 - 1.2 mg/dL 1.0 0.7 <0.2  Bilirubin, Direct 0.0 - 0.2 mg/dL - <0.1 -    Lab Results  Component Value Date/Time   TSH 2.550 12/02/2019 11:07 AM   TSH 2.620 11/05/2018 10:59 AM   FREET4 1.36 11/05/2018 10:59 AM   FREET4 1.35 10/30/2017 12:43 PM    CBC Latest Ref Rng & Units 12/20/2020 12/20/2020 04/25/2020  WBC 4.0 - 10.5 K/uL - 11.1(H) 4.0  Hemoglobin 12.0 - 15.0 g/dL 12.2 11.5(L) 11.2(L)  Hematocrit 36.0 - 46.0 % 36.0 34.7(L) 34.9(L)  Platelets 150 - 400 K/uL - 166 180    Lab Results  Component Value Date/Time   VD25OH 58.9 02/28/2020 04:48 PM    Clinical ASCVD: No  The ASCVD Risk score (Arnett DK, et al., 2019) failed to calculate for the following reasons:  The 2019 ASCVD risk score is only valid for ages 36 to 108    Depression screen PHQ 2/9 06/29/2020 12/02/2019 05/19/2019  Decreased Interest 0 1 0  Down, Depressed, Hopeless 0 2 1  PHQ - 2 Score 0 3 1  Altered sleeping - 0 0  Tired, decreased energy - 3 1  Change in appetite - 0 0  Feeling bad or failure about yourself  - 0 0  Trouble concentrating - 0 0  Moving slowly or fidgety/restless - 0 0  Suicidal thoughts - 0 0  PHQ-9 Score - 6 2  Difficult doing work/chores - Not difficult at all Not difficult at all      Social History   Tobacco Use  Smoking Status Former   Years: 2.00   Types: Cigarettes  Smokeless Tobacco Never  Tobacco Comments   smoked sometimes not daily    BP Readings from Last 3 Encounters:  01/01/21 114/78  12/20/20 (!) 172/68  11/29/20 112/70   Pulse Readings from Last 3 Encounters:  01/01/21 81  12/20/20 68  11/29/20 73   Wt Readings from Last 3 Encounters:  01/01/21 117 lb 3.2 oz (53.2 kg)  12/20/20 116 lb 13.5 oz (53 kg)  11/29/20 117 lb (53.1 kg)   BMI Readings from Last 3 Encounters:  01/01/21 22.59 kg/m  12/20/20 22.08 kg/m  11/29/20 22.11 kg/m    Assessment/Interventions: Review of Whitney Henderson past medical history, allergies, medications, health status, including review of consultants reports, laboratory and other test data, was performed as part of comprehensive evaluation and provision of chronic care management services.   SDOH:  (Social Determinants of Health) assessments and interventions performed: Yes SDOH Interventions    Flowsheet Row Most Recent Value  SDOH Interventions   Stress Interventions --  [Whitney Henderson agreed to GAD-7]      SDOH Screenings   Alcohol Screen: Not on file  Depression (PHQ2-9): Low Risk    PHQ-2 Score: 0  Financial Resource Strain: Low Risk    Difficulty of Paying Living Expenses: Not hard at all  Food Insecurity: No Food Insecurity   Worried About Charity fundraiser in the Last Year: Never true   Ran Out of Food in the Last Year: Never true  Housing: Low Risk    Last Housing Risk Score: 0  Physical Activity: Inactive   Days of Exercise per Week: 0 days   Minutes of Exercise per Session: 0 min  Social Connections: Not on file  Stress: Stress Concern Present   Feeling of Stress : Very much  Tobacco Use: Medium Risk   Smoking Tobacco Use: Former   Smokeless Tobacco Use: Never   Passive Exposure: Not on file  Transportation Needs: No Transportation Needs   Lack of Transportation (Medical): No   Lack of Transportation (Non-Medical): No    CCM Care Plan  Allergies  Allergen Reactions   Codeine Rash    Medications Reviewed Today     Reviewed by Mayford Knife, RPH  (Pharmacist) on 02/16/21 at 1016  Med List Status: <None>   Medication Order Taking? Sig Documenting Provider Last Dose Status Informant  acetaminophen (TYLENOL) 500 MG tablet [id number] Yes Take 500 mg by mouth every 6 (six) hours as needed for mild pain or moderate pain. [provider] Taking Active Child  amLODipine (NORVASC) 2.5 MG tablet [id number] Yes TAKE 1 TABLET(2.5 MG) BY MOUTH TWICE DAILY  Whitney Henderson taking differently: Take 2.5 mg by mouth 2 (two) times daily.   [name],  MD Taking Active Child  Apoaequorin (PREVAGEN PO) [id number] Yes Take 1 capsule by mouth every other day. [provider] Taking Active Child  betamethasone dipropionate (DIPROLENE) 0.05 % cream [id number] Yes APPLY THIN LAYER EXTERNALLY TO THE AFFECTED AREA EVERY DAY AS NEEDED  Whitney Henderson taking differently: 1 application daily as needed (skin irritation).   Minette Brine, FNP Taking Active Child  Calcium Carb-Cholecalciferol (CALCIUM 500 +D PO) [id number] Yes Take 1 tablet by mouth every other day. [provider] Taking Active Child  Multiple Vitamins-Minerals (CENTRUM SILVER PO) [id number] Yes Take 1 tablet by mouth every other day. [provider] Taking Active Child  Propylene Glycol (SYSTANE BALANCE OP) [id number] Yes Place 1 drop into both eyes daily as needed (for itchy eyes). [provider] Taking Active Child  sertraline (ZOLOFT) 25 MG tablet [id number] Yes TAKE 1 TABLET(25 MG) BY MOUTH DAILY  Whitney Henderson taking differently: Take 25 mg by mouth daily.   [name], MD Taking Active Child  vitamin C (ASCORBIC ACID) 500 MG tablet [id number] Yes Take 500 mg by mouth every other day. [provider] Taking Active Child            Whitney Henderson Active Problem List   Diagnosis Date Noted   Tremor 01/06/2020   Age-related osteoporosis with current pathological fracture 11/05/2018   Grief reaction 03/31/2018   Hypertensive nephropathy 03/03/2018   Chronic renal  disease, stage III ([place]) 12/11/2017   Hypertension, essential, benign 10/30/2017   Anxiety 10/11/2017   Memory disorder 09/04/2016   Headache 04/27/2015   Hypertension 01/16/2013   UTI (lower urinary tract infection) 01/16/2013    Immunization History  Administered Date(s) Administered   Fluad Quad(high Dose 65+) 09/25/2018   Influenza, High Dose Seasonal PF 10/02/2016, 10/14/2019   Influenza-Unspecified 10/22/2017   PFIZER(Purple Top)SARS-COV-2 Vaccination 02/09/2019, 03/01/2019, 12/09/2019, 06/20/2020   Pneumococcal Conjugate-13 11/28/2015   Pneumococcal Polysaccharide-23 12/04/2016   Tdap 12/20/2020   Zoster Recombinat (Shingrix) 09/25/2018, 12/04/2018    Conditions to be addressed/monitored:  Hypertension and Anxiety  Care Plan : [place]  Updates made by Mayford Knife, RPH since 02/16/2021 12:00 AM     Problem: HTN, Anxiety   Priority: High     Long-Range Goal: Disease Management   This Visit's Progress: On track  Recent Progress: Not on track  Priority: High  Note:   Current Barriers:  Unable to achieve control of anxiety    Pharmacist Clinical Goal(s):  Whitney Henderson will achieve control of Anxiety as evidenced by a decrease in GAD-7 maintain control of hypertension as evidenced by BP readings of less than 140/90  through collaboration with PharmD and provider.   Interventions: 1:1 collaboration with [name], MD regarding development and update of comprehensive plan of care as evidenced by provider attestation and co-signature Inter-disciplinary care team collaboration (see longitudinal plan of care) Comprehensive medication review performed; medication list updated in electronic medical record  Hypertension (BP goal <140/90) -Controlled -Current treatment: Amlodipine 2.5 mg tablet twice per day  Appropriate, Effective, Safe, Accessible -Current home readings: BP readings have been very good. 153/73 - due to someone coming in as a  caregiver and she was excited, before then it is usually 123/64, 133/83 -Current dietary habits: Whitney Henderson reports that she eating good. Whitney Henderson is eating plenty of vegetables, and fruit and she has cut back on breads, and fried fatty foods. She is eating baked chicken  -She is trying increase the amount of water she is drinking, about 5-6 glasses per day,  she is filling up a pitcher and then drinking it  -Current exercise habits: the physical therapist has shown Ms. Lepore exercises to do and now the caregiver is doing the exercises with her. Every other Tuesday and Thursday, and exercising with [name] and walking the track and doing exercise at with the caregiver  -Denies hypotensive/hypertensive symptoms -Educated on Daily salt intake goal < 2300 mg; Symptoms of hypotension and importance of maintaining adequate hydration; -Counseled to monitor BP at home at , document, and provide log at future appointments -Recommended to continue current medication  Anxiety (Goal: ) -Controlled -Current treatment: Sertraline 25 mg tablet once per day  Appropriate, Effective, Safe, Accessible -GAD7: 8 -Connected with Dr. Baird Cancer for a referral for mental health support -Whitney Henderson went to a therapist and was told that Ms. Wann did not need any therapy in 2019  -This happened when her husband passed, and although the doctor recommended someone but she never received the information.  -Educated on Benefits of medication for symptom control Benefits of cognitive-behavioral therapy with or without medication -Recommended to continue current medication -Collaborate with PCP team to help Whitney Henderson for therapy scheduling, they would prefer it to be in person.   Whitney Henderson Goals/Self-Care Activities Whitney Henderson will:  - take medications as prescribed as evidenced by Whitney Henderson report and record review  Follow Up Plan: The Whitney Henderson has been provided with contact information for the care management team and has been advised  to call with any health related questions or concerns.       Medication Assistance: None required.  Whitney Henderson affirms current coverage meets needs.  Compliance/Adherence/Medication fill history: Care Gaps: COVID-19 Vaccine - completed January 11 th, 2023   Star-Rating Drugs: None noted   Whitney Henderson's preferred pharmacy is:  [facility] [place], [address] [place] [address] Phone: [phone] Fax: [phone]  Uses pill box? Yes Pt endorses 90% compliance  We discussed: Benefits of medication synchronization, packaging and delivery as well as enhanced pharmacist oversight with [employer]. Whitney Henderson decided to: Continue current medication management strategy  Care Plan and Follow Up Whitney Henderson Decision:  Whitney Henderson agrees to Care Plan and Follow-up.  Plan: The Whitney Henderson has been provided with contact information for the care management team and has been advised to call with any health related questions or concerns.   [name], CPP, PharmD Clinical Pharmacist Practitioner [facility] [phone]

## 2021-02-20 DIAGNOSIS — I10 Essential (primary) hypertension: Secondary | ICD-10-CM | POA: Diagnosis not present

## 2021-02-20 DIAGNOSIS — I129 Hypertensive chronic kidney disease with stage 1 through stage 4 chronic kidney disease, or unspecified chronic kidney disease: Secondary | ICD-10-CM | POA: Diagnosis not present

## 2021-02-22 DIAGNOSIS — N1832 Chronic kidney disease, stage 3b: Secondary | ICD-10-CM | POA: Diagnosis not present

## 2021-02-22 DIAGNOSIS — W19XXXD Unspecified fall, subsequent encounter: Secondary | ICD-10-CM | POA: Diagnosis not present

## 2021-02-22 DIAGNOSIS — I129 Hypertensive chronic kidney disease with stage 1 through stage 4 chronic kidney disease, or unspecified chronic kidney disease: Secondary | ICD-10-CM | POA: Diagnosis not present

## 2021-02-22 DIAGNOSIS — R7309 Other abnormal glucose: Secondary | ICD-10-CM | POA: Diagnosis not present

## 2021-02-28 DIAGNOSIS — N1832 Chronic kidney disease, stage 3b: Secondary | ICD-10-CM | POA: Diagnosis not present

## 2021-02-28 DIAGNOSIS — I129 Hypertensive chronic kidney disease with stage 1 through stage 4 chronic kidney disease, or unspecified chronic kidney disease: Secondary | ICD-10-CM | POA: Diagnosis not present

## 2021-02-28 DIAGNOSIS — R7309 Other abnormal glucose: Secondary | ICD-10-CM | POA: Diagnosis not present

## 2021-02-28 DIAGNOSIS — W19XXXD Unspecified fall, subsequent encounter: Secondary | ICD-10-CM | POA: Diagnosis not present

## 2021-03-01 ENCOUNTER — Telehealth: Payer: Medicare Other

## 2021-03-01 ENCOUNTER — Telehealth: Payer: Self-pay

## 2021-03-01 ENCOUNTER — Encounter: Payer: Medicare Other | Admitting: Internal Medicine

## 2021-03-01 NOTE — Telephone Encounter (Signed)
°  Care Management   Follow Up Note   03/01/2021 Name: Whitney Henderson MRN: [id number] DOB: [birth year]   Referred by: [name], MD Reason for referral : Chronic Care Management (Unsuccessful call)   An unsuccessful telephone outreach was attempted today. The patient was referred to the case management team for assistance with care management and care coordination.   Follow Up Plan: The care management team will reach out to the patient again over the next 14 days.   Daneen Schick, BSW, CDP Social Worker, Certified Dementia Practitioner [place] [facility] [place] [facility] [phone]

## 2021-03-05 ENCOUNTER — Encounter: Payer: Medicare Other | Admitting: Internal Medicine

## 2021-03-05 DIAGNOSIS — H353131 Nonexudative age-related macular degeneration, bilateral, early dry stage: Secondary | ICD-10-CM | POA: Diagnosis not present

## 2021-03-05 DIAGNOSIS — H524 Presbyopia: Secondary | ICD-10-CM | POA: Diagnosis not present

## 2021-03-05 DIAGNOSIS — H40013 Open angle with borderline findings, low risk, bilateral: Secondary | ICD-10-CM | POA: Diagnosis not present

## 2021-03-05 DIAGNOSIS — H35033 Hypertensive retinopathy, bilateral: Secondary | ICD-10-CM | POA: Diagnosis not present

## 2021-03-05 DIAGNOSIS — H35013 Changes in retinal vascular appearance, bilateral: Secondary | ICD-10-CM | POA: Diagnosis not present

## 2021-03-06 ENCOUNTER — Ambulatory Visit (INDEPENDENT_AMBULATORY_CARE_PROVIDER_SITE_OTHER): Payer: Medicare Other

## 2021-03-06 ENCOUNTER — Telehealth: Payer: Medicare Other

## 2021-03-06 DIAGNOSIS — M810 Age-related osteoporosis without current pathological fracture: Secondary | ICD-10-CM

## 2021-03-06 DIAGNOSIS — I10 Essential (primary) hypertension: Secondary | ICD-10-CM

## 2021-03-06 DIAGNOSIS — N1832 Chronic kidney disease, stage 3b: Secondary | ICD-10-CM

## 2021-03-06 DIAGNOSIS — I129 Hypertensive chronic kidney disease with stage 1 through stage 4 chronic kidney disease, or unspecified chronic kidney disease: Secondary | ICD-10-CM

## 2021-03-07 NOTE — Chronic Care Management (AMB) (Signed)
Chronic Care Management   CCM RN Visit Note  03/06/2021 Name: Whitney Henderson MRN: [id number] DOB: 1928-07-07  Subjective: Whitney Henderson is a 88 y.o. year old female who is a primary care patient of [name], MD. The care management team was consulted for assistance with disease management and care coordination needs.    Engaged with patient by telephone for follow up visit in response to provider referral for case management and/or care coordination services.   Consent to Services:  The patient was given information about Chronic Care Management services, agreed to services, and gave verbal consent prior to initiation of services.  Please see initial visit note for detailed documentation.   Patient agreed to services and verbal consent obtained.   Assessment: Review of patient past medical history, allergies, medications, health status, including review of consultants reports, laboratory and other test data, was performed as part of comprehensive evaluation and provision of chronic care management services.   SDOH (Social Determinants of Health) assessments and interventions performed:  Yes, no acute changes   CCM Care Plan  Allergies  Allergen Reactions   Codeine Rash    Outpatient Encounter Medications as of 03/06/2021  Medication Sig   acetaminophen (TYLENOL) 500 MG tablet Take 500 mg by mouth every 6 (six) hours as needed for mild pain or moderate pain.   amLODipine (NORVASC) 2.5 MG tablet TAKE 1 TABLET(2.5 MG) BY MOUTH TWICE DAILY (Patient taking differently: Take 2.5 mg by mouth 2 (two) times daily.)   Apoaequorin (PREVAGEN PO) Take 1 capsule by mouth every other day.   betamethasone dipropionate (DIPROLENE) 0.05 % cream APPLY THIN LAYER EXTERNALLY TO THE AFFECTED AREA EVERY DAY AS NEEDED (Patient taking differently: 1 application daily as needed (skin irritation).)   Calcium Carb-Cholecalciferol (CALCIUM 500 +D PO) Take 1 tablet by mouth every other day.   Multiple  Vitamins-Minerals (CENTRUM SILVER PO) Take 1 tablet by mouth every other day.   Propylene Glycol (SYSTANE BALANCE OP) Place 1 drop into both eyes daily as needed (for itchy eyes).   sertraline (ZOLOFT) 25 MG tablet TAKE 1 TABLET(25 MG) BY MOUTH DAILY (Patient taking differently: Take 25 mg by mouth daily.)   thiamine 100 MG tablet Take 100 mg by mouth daily.   vitamin C (ASCORBIC ACID) 500 MG tablet Take 500 mg by mouth every other day.   No facility-administered encounter medications on file as of 03/06/2021.    Patient Active Problem List   Diagnosis Date Noted   Tremor 01/06/2020   Age-related osteoporosis with current pathological fracture 11/05/2018   Grief reaction 03/31/2018   Hypertensive nephropathy 03/03/2018   Chronic renal disease, stage III ([place]) 12/11/2017   Hypertension, essential, benign 10/30/2017   Anxiety 10/11/2017   Memory disorder 09/04/2016   Headache 04/27/2015   Hypertension 01/16/2013   UTI (lower urinary tract infection) 01/16/2013    Conditions to be addressed/monitored: HTN, Chronic Renal Disease stage III, Hypertensive Nephropathy, osteoporosis  Care Plan : RN Care Manager Plan of Care  Updates made by Lynne Logan, RN since 03/06/2021 12:00 AM     Problem: No Plan of care established for management of chronic disease states (HTN, Chronic Renal Disease stage III, Hypertensive Nephropathy, osteoporosis)   Priority: High     Long-Range Goal: Development of plan of care for chroinc disease management for HTN, Chronic Renal Disease stage III, Hypertensive Nephropathy, osteoporosis   Start Date: 11/23/2020  Expected End Date: 11/23/2021  Recent Progress: On track  Priority: High  Note:   Current Barriers:  Knowledge Deficits related to plan of care for management of HTN, Chronic Renal Disease stage III, Hypertensive Nephropathy, osteoporosis Chronic Disease Management support and education needs related to HTN, Chronic Renal Disease stage III,  Hypertensive Nephropathy, osteoporosis  RNCM Clinical Goal(s):  Patient will verbalize basic understanding of  HTN, Chronic Renal Disease stage III, Hypertensive Nephropathy, osteoporosis disease process and self health management plan   demonstrate Ongoing health management independence   continue to work with RN Care Manager to address care management and care coordination needs related to  HTN, Chronic Renal Disease stage III, Hypertensive Nephropathy, osteoporosis will demonstrate ongoing self health care management ability    through collaboration with RN Care manager, provider, and care team.   Interventions: 1:1 collaboration with primary care provider regarding development and update of comprehensive plan of care as evidenced by provider attestation and co-signature Inter-disciplinary care team collaboration (see longitudinal plan of care) Evaluation of current treatment plan related to  self management and patient's adherence to plan as established by provider  Hypertension Interventions: (Status:  Condition stable.  Not addressed this visit.) Objective:  Last practice recorded BP readings:  BP Readings from Last 3 Encounters:  01/01/21 114/78  12/20/20 (!) 172/68  11/29/20 112/70   Most recent eGFR/CrCl:  Lab Results  Component Value Date   EGFR 40 (L) 01/01/2021    No components found for: CRCL Review of patient status, including review of consultant's reports, relevant laboratory and other test results, and medications completed. Evaluation of current treatment plan related to hypertension self management and patient's adherence to plan as established by provider Provided education on prescribed diet low Sodium Assessed social determinant of health barriers Discussed plans with patient for ongoing care management follow up and provided patient with direct contact information for care management team   Chronic Kidney Disease Interventions:  (Status:  Condition stable.  Not  addressed this visit.) Completed inbound call with daughter [name] and patient Evaluation of current treatment plan related to chronic kidney disease self management and patient's adherence to plan as established by provider      Reviewed prescribed diet increase water intake as discussed Provided education on kidney disease progression    Review of patient status, including review of consultant's reports, relevant laboratory and other test results, and medications completed. Discussed plans with patient for ongoing care management follow up and provided patient with direct contact information for care management team Lab Results  Component Value Date   EGFR 40 (L) 01/01/2021  Urinary Tract Infection Interventions:  (Status:  Goal Met.)  Short Term Goal Evaluation of current treatment plan related to  UTI ,  self-management and patient's adherence to plan as established by provider Assessed for symptoms suggestive of impaired urinary elimination and or urinary tract infection, patient denies Educated patient on the importance to increase her daily water intake, recommended 48-64 oz daily unless otherwise directed Educated on the importance to report reoccurring symptoms promptly in order to receive early treatment Discussed plans with patient for ongoing care management follow up and provided patient with direct contact information for care management team  Patient Goals/Self-Care Activities: Take all medications as prescribed Attend all scheduled provider appointments Call provider office for new concerns or questions  drink 6 to 8 glasses of water each day  Follow Up Plan:  Telephone follow up appointment with care management team member scheduled for:  05/11/21     Barb Merino, RN, BSN, CCM Care Management Coordinator  [facility] [facility]  Direct Phone: [phone]

## 2021-03-07 NOTE — Patient Instructions (Signed)
Visit Information  Thank you for taking time to visit with me today. Please don't hesitate to contact me if I can be of assistance to you before our next scheduled telephone appointment.  Following are the goals we discussed today:  (Copy and paste patient goals from clinical care plan here)  Our next appointment is by telephone on 05/11/21 at 10:30 AM  Please call the care guide team at [phone] if you need to cancel or reschedule your appointment.   If you are experiencing a Mental Health or [place] or need someone to talk to, please call 1-800-273-TALK (toll free, 24 hour hotline)   The patient verbalized understanding of instructions, educational materials, and care plan provided today and agreed to receive a mailed copy of patient instructions, educational materials, and care plan.   Barb Merino, RN, BSN, CCM Care Management Coordinator [place] [facility]  Direct Phone: [phone]

## 2021-03-16 ENCOUNTER — Other Ambulatory Visit: Payer: Self-pay | Admitting: Internal Medicine

## 2021-03-20 DIAGNOSIS — I10 Essential (primary) hypertension: Secondary | ICD-10-CM

## 2021-03-20 DIAGNOSIS — I129 Hypertensive chronic kidney disease with stage 1 through stage 4 chronic kidney disease, or unspecified chronic kidney disease: Secondary | ICD-10-CM

## 2021-03-20 DIAGNOSIS — M810 Age-related osteoporosis without current pathological fracture: Secondary | ICD-10-CM

## 2021-03-21 ENCOUNTER — Other Ambulatory Visit: Payer: Self-pay | Admitting: Internal Medicine

## 2021-03-21 DIAGNOSIS — I10 Essential (primary) hypertension: Secondary | ICD-10-CM

## 2021-03-28 ENCOUNTER — Ambulatory Visit (INDEPENDENT_AMBULATORY_CARE_PROVIDER_SITE_OTHER): Payer: Medicare Other

## 2021-03-28 DIAGNOSIS — I10 Essential (primary) hypertension: Secondary | ICD-10-CM

## 2021-03-28 DIAGNOSIS — I129 Hypertensive chronic kidney disease with stage 1 through stage 4 chronic kidney disease, or unspecified chronic kidney disease: Secondary | ICD-10-CM

## 2021-03-28 DIAGNOSIS — N1832 Chronic kidney disease, stage 3b: Secondary | ICD-10-CM

## 2021-03-28 NOTE — Patient Instructions (Signed)
Social Worker Visit Information ? ?Goals we discussed today:  ? Goals Addressed   ? ?  ?  ?  ?  ? This Visit's Progress  ?  Obtain a Caregiver in the Home     ?  Timeframe:  Short-Term Goal ?Priority:  High ?Start Date: 12.8.22                            ? ?Next planned outreach: 3.29.23 ? ?Patient Goals/Self-Care Activities ?patient will: with the help of her daughter ?-Participate in virtual assessment to determine eligibility for long term care health plan benefit ? - Engage with [facility] regarding caregiver needs ?-Contact SW as needed prior to next scheduled call ? ?  ? ?  ?  ? ?The patient verbalized understanding of instructions, educational materials, and care plan provided today and declined offer to receive copy of patient instructions, educational materials, and care plan.  ? ?Follow Up Plan: SW will follow up with patient by phone over the next month ? ? ?Daneen Schick, BSW, CDP ?Social Worker, Certified Dementia Practitioner ?[facility] [place] [facility] ?[phone] ? ?   ? ?

## 2021-03-28 NOTE — Chronic Care Management (AMB) (Signed)
?Chronic Care Management  ? ? Social Work Note ? ?03/28/2021 ?Name: Whitney Henderson MRN: [id number] DOB: 04/15/1928 ? ?Whitney Henderson is a 88 y.o. year old female who is a primary care patient of [name], MD. The CCM team was consulted to assist the patient with chronic disease management and/or care coordination needs related to:  HTN, Chronic Renal Disease Stage III, Hypertensive Nephropathy .  ? ?Engaged with patients daughter Whitney Henderson by phone  for follow up visit in response to provider referral for social work chronic care management and care coordination services.  ? ?Consent to Services:  ?The patient was given information about Chronic Care Management services, agreed to services, and gave verbal consent prior to initiation of services.  Please see initial visit note for detailed documentation.  ? ?Patient agreed to services and consent obtained.  ? ?Assessment: Review of patient past medical history, allergies, medications, and health status, including review of relevant consultants reports was performed today as part of a comprehensive evaluation and provision of chronic care management and care coordination services.    ? ?SDOH (Social Determinants of Health) assessments and interventions performed:   ? ?Advanced Directives Status: Not addressed in this encounter. ? ?CCM Care Plan ? ?Allergies  ?Allergen Reactions  ? Codeine Rash  ? ? ?Outpatient Encounter Medications as of 03/28/2021  ?Medication Sig  ? acetaminophen (TYLENOL) 500 MG tablet Take 500 mg by mouth every 6 (six) hours as needed for mild pain or moderate pain.  ? amLODipine (NORVASC) 2.5 MG tablet TAKE 1 TABLET(2.5 MG) BY MOUTH TWICE DAILY  ? Apoaequorin (PREVAGEN PO) Take 1 capsule by mouth every other day.  ? betamethasone dipropionate (DIPROLENE) 0.05 % cream APPLY THIN LAYER EXTERNALLY TO THE AFFECTED AREA EVERY DAY AS NEEDED (Patient taking differently: 1 application daily as needed (skin irritation).)  ? Calcium  Carb-Cholecalciferol (CALCIUM 500 +D PO) Take 1 tablet by mouth every other day.  ? Multiple Vitamins-Minerals (CENTRUM SILVER PO) Take 1 tablet by mouth every other day.  ? Propylene Glycol (SYSTANE BALANCE OP) Place 1 drop into both eyes daily as needed (for itchy eyes).  ? sertraline (ZOLOFT) 25 MG tablet TAKE 1 TABLET(25 MG) BY MOUTH DAILY  ? thiamine 100 MG tablet Take 100 mg by mouth daily.  ? vitamin C (ASCORBIC ACID) 500 MG tablet Take 500 mg by mouth every other day.  ? ?No facility-administered encounter medications on file as of 03/28/2021.  ? ? ?Patient Active Problem List  ? Diagnosis Date Noted  ? Tremor 01/06/2020  ? Age-related osteoporosis with current pathological fracture 11/05/2018  ? Grief reaction 03/31/2018  ? Hypertensive nephropathy 03/03/2018  ? Chronic renal disease, stage III ([place]) 12/11/2017  ? Hypertension, essential, benign 10/30/2017  ? Anxiety 10/11/2017  ? Memory disorder 09/04/2016  ? Headache 04/27/2015  ? Hypertension 01/16/2013  ? UTI (lower urinary tract infection) 01/16/2013  ? ? ?Conditions to be addressed/monitored:  HTN, Chronic Renal Disease Stage III, Hypertensive Nephropathy ; Limited access to caregiver ? ?Care Plan : Social Work Plan of Care  ?Updates made by Daneen Schick since 03/28/2021 12:00 AM  ?  ? ?Problem: Mobility and Independence   ?  ? ?Goal: Obtain a caregiver in the home   ?Start Date: 12/28/2020  ?This Visit's Progress: On track  ?Recent Progress: On track  ?Priority: High  ?Note:   ?Current Barriers:  ?Chronic disease management support and education needs related to  HTN, Chronic Renal Disease stage III, Hypertensive Nephropathy   ?  Recent admission due to UTI ?Limited access to a caregiver ?Social Worker Clinical Goal(s):  ?patient will work with SW to identify and address any acute and/or chronic care coordination needs related to the self health management of  HTN, Chronic renal disease stage III, hypertensive nephropathy   ?Patient will follow up with  long term care insurance policy as directed by SW ?SW Interventions:  ?Inter-disciplinary care team collaboration (see longitudinal plan of care) ?Collaboration with [name], MD regarding development and update of comprehensive plan of care as evidenced by provider attestation and co-signature ?Inbound call received from patients daughter Whitney Henderson to request assistance with care coordination needs ?Discussed the patient continues to have a caregiver in the home every other week on Tuesdays and Thursdays with hope of increasing hours once the patients long term care insurance claim is approved ?Determined the patient will have a virtual assessment next Wednesday with her long term care health plan to determine if patient is eligible for reimbursement as the family is currently paying out of pocket ?Whitney Henderson requests SW assistance with completion of medical history form prior to virtual assessment ?Advised Whitney Henderson the patients primary care provider, Dr. Baird Cancer would need to complete this document ?Provided Whitney Henderson with the e-mail contact for the practice manager to allow the health plan to e-mail the form for Dr. Baird Cancer to complete ?Discussed plan for SW to notify Dr. Baird Cancer of future assessment and form that would be sent to her for completion ?Collaboration with Dr. Baird Cancer to advise of above interventions and plan ?Scheduled follow up call over the next three weeks to assess outcome of assessment ?Patient Goals/Self-Care Activities ?patient will: with the help of her daughter ?-Participate in virtual assessment to determine eligibility for long term care health plan benefit ? -  Engage with [facility] regarding caregiver needs ?-Contact SW as needed prior to next scheduled call ? ?Follow Up Plan:  SW will follow up with the patient and her daughter over the next month ? ?  ?  ? ?Follow Up Plan: SW will follow up with patient by phone over the next month to assess outcome of virtual assessment. ?      ?Daneen Schick, BSW, CDP ?Social Worker, Certified Dementia Practitioner ?[facility] / [place] [facility] ?[phone] ? ?   ? ? ? ? ?

## 2021-03-29 ENCOUNTER — Encounter: Payer: Self-pay | Admitting: Internal Medicine

## 2021-04-04 ENCOUNTER — Ambulatory Visit: Payer: Medicare Other | Admitting: Internal Medicine

## 2021-04-10 ENCOUNTER — Encounter: Payer: Self-pay | Admitting: Internal Medicine

## 2021-04-16 ENCOUNTER — Encounter: Payer: Self-pay | Admitting: Internal Medicine

## 2021-04-16 ENCOUNTER — Other Ambulatory Visit: Payer: Self-pay

## 2021-04-16 ENCOUNTER — Ambulatory Visit (INDEPENDENT_AMBULATORY_CARE_PROVIDER_SITE_OTHER): Payer: Medicare Other | Admitting: Internal Medicine

## 2021-04-16 VITALS — BP 132/70 | HR 99 | Temp 98.1°F | Ht 60.8 in | Wt 115.4 lb

## 2021-04-16 DIAGNOSIS — N1832 Chronic kidney disease, stage 3b: Secondary | ICD-10-CM

## 2021-04-16 DIAGNOSIS — L309 Dermatitis, unspecified: Secondary | ICD-10-CM | POA: Diagnosis not present

## 2021-04-16 DIAGNOSIS — I129 Hypertensive chronic kidney disease with stage 1 through stage 4 chronic kidney disease, or unspecified chronic kidney disease: Secondary | ICD-10-CM

## 2021-04-16 DIAGNOSIS — Z6821 Body mass index (BMI) 21.0-21.9, adult: Secondary | ICD-10-CM

## 2021-04-16 DIAGNOSIS — R7309 Other abnormal glucose: Secondary | ICD-10-CM

## 2021-04-16 DIAGNOSIS — R413 Other amnesia: Secondary | ICD-10-CM | POA: Diagnosis not present

## 2021-04-16 DIAGNOSIS — N39498 Other specified urinary incontinence: Secondary | ICD-10-CM

## 2021-04-16 LAB — POCT URINALYSIS DIPSTICK
Bilirubin, UA: NEGATIVE
Blood, UA: NEGATIVE
Glucose, UA: NEGATIVE
Ketones, UA: NEGATIVE
Nitrite, UA: NEGATIVE
Protein, UA: NEGATIVE
Spec Grav, UA: 1.02 (ref 1.010–1.025)
Urobilinogen, UA: 0.2 E.U./dL
pH, UA: 6 (ref 5.0–8.0)

## 2021-04-16 MED ORDER — TRIAMCINOLONE ACETONIDE 0.1 % EX CREA: TOPICAL_CREAM | CUTANEOUS | 0 refills | Status: AC

## 2021-04-16 MED ORDER — TRIAMCINOLONE ACETONIDE 0.1 % EX CREA: TOPICAL_CREAM | CUTANEOUS | Status: DC

## 2021-04-16 NOTE — Patient Instructions (Signed)

## 2021-04-16 NOTE — Progress Notes (Addendum)
?Rich Brave [place],acting as a Education administrator for Maximino Greenland, MD.,have documented all relevant documentation on the behalf of Maximino Greenland, MD,as directed by  Maximino Greenland, MD while in the presence of Maximino Greenland, MD.  ?This visit occurred during the SARS-CoV-2 public health emergency.  Safety protocols were in place, including screening questions prior to the visit, additional usage of staff PPE, and extensive cleaning of exam room while observing appropriate contact time as indicated for disinfecting solutions. ? ?Subjective:  ?  ? Patient ID: Whitney Henderson , female    DOB: 1928/06/19 , 88 y.o.   MRN: [id number] ? ? ?Chief Complaint  ?Patient presents with  ? Hypertension  ? ? ?HPI ? ?She presents for BP check. She reports compliance with meds. She is accompanied by her daughter, V. Today. She offers no concerns. Patient denies headaches, chest pain and shortness of breath. Daughter adds that she has nursing assistance during the week. She needs help with toileting, food preparation and other ADLs. States she must be reminded to eat during the day.  ? ?Hypertension ?This is a chronic problem. The current episode started more than 1 year ago. The problem has been gradually improving since onset. The problem is uncontrolled. Pertinent negatives include no blurred vision, chest pain, headaches, neck pain, orthopnea, palpitations or shortness of breath. The current treatment provides moderate improvement. Compliance problems include exercise.    ? ?Past Medical History:  ?Diagnosis Date  ? Anxiety   ? Cataracts, bilateral   ? Discoid lupus   ? Headache 04/27/2015  ? Left>right periorbital  ? Hypertension   ? Memory difficulties 09/04/2016  ?  ? ?Family History  ?Problem Relation Age of Onset  ? Early death Mother   ? Cancer Father   ? Breast cancer Daughter   ?     54s  ? ? ? ?Current Outpatient Medications:  ?  acetaminophen (TYLENOL) 500 MG tablet, Take 500 mg by mouth every 6 (six) hours as needed for mild  pain or moderate pain., Disp: , Rfl:  ?  amLODipine (NORVASC) 2.5 MG tablet, TAKE 1 TABLET(2.5 MG) BY MOUTH TWICE DAILY, Disp: 180 tablet, Rfl: 1 ?  Apoaequorin (PREVAGEN PO), Take 1 capsule by mouth every other day., Disp: , Rfl:  ?  betamethasone dipropionate (DIPROLENE) 0.05 % cream, APPLY THIN LAYER EXTERNALLY TO THE AFFECTED AREA EVERY DAY AS NEEDED (Patient taking differently: 1 application. daily as needed (skin irritation).), Disp: 60 g, Rfl: 2 ?  Calcium Carb-Cholecalciferol (CALCIUM 500 +D PO), Take 1 tablet by mouth every other day., Disp: , Rfl:  ?  Multiple Vitamins-Minerals (CENTRUM SILVER PO), Take 1 tablet by mouth every other day., Disp: , Rfl:  ?  Propylene Glycol (SYSTANE BALANCE OP), Place 1 drop into both eyes daily as needed (for itchy eyes)., Disp: , Rfl:  ?  sertraline (ZOLOFT) 25 MG tablet, TAKE 1 TABLET(25 MG) BY MOUTH DAILY, Disp: 90 tablet, Rfl: 1 ?  thiamine 100 MG tablet, Take 100 mg by mouth daily., Disp: , Rfl:  ?  vitamin C (ASCORBIC ACID) 500 MG tablet, Take 500 mg by mouth every other day., Disp: , Rfl:  ?  triamcinolone cream (KENALOG) 0.1 %, APPLY TO AFFECTED AREA TWICE DAILY AS NEEDED, Disp: 45 g, Rfl: 0  ? ?Allergies  ?Allergen Reactions  ? Codeine Rash  ?  ? ?Review of Systems  ?Constitutional: Negative.   ?Eyes:  Negative for blurred vision.  ?Respiratory: Negative.  Negative for shortness  of breath.   ?Cardiovascular: Negative.  Negative for chest pain, palpitations and orthopnea.  ?Genitourinary:  Positive for urgency.  ?Musculoskeletal:  Negative for neck pain.  ?Skin:  Positive for rash.  ?Neurological: Negative.  Negative for headaches.  ?Psychiatric/Behavioral: Negative.     ? ?Today's Vitals  ? 04/16/21 1113  ?BP: 132/70  ?Pulse: 99  ?Temp: 98.1 ?F (36.7 ?C)  ?Weight: 115 lb 6.4 oz (52.3 kg)  ?Height: 5' 0.8" (1.544 m)  ?PainSc: 0-No pain  ? ?Body mass index is 21.95 kg/m?.  ?Wt Readings from Last 3 Encounters:  ?04/16/21 115 lb 6.4 oz (52.3 kg)  ?01/01/21 117 lb 3.2  oz (53.2 kg)  ?12/20/20 116 lb 13.5 oz (53 kg)  ?  ? ?Objective:  ?Physical Exam ?Vitals and nursing note reviewed.  ?Constitutional:   ?   Appearance: Normal appearance.  ?HENT:  ?   Head: Normocephalic and atraumatic.  ?   Nose:  ?   Comments: Masked  ?   Mouth/Throat:  ?   Comments: Masked  ?Eyes:  ?   Extraocular Movements: Extraocular movements intact.  ?Cardiovascular:  ?   Rate and Rhythm: Normal rate and regular rhythm.  ?   Heart sounds: Normal heart sounds.  ?Pulmonary:  ?   Effort: Pulmonary effort is normal.  ?   Breath sounds: Normal breath sounds.  ?Musculoskeletal:  ?   Cervical back: Normal range of motion.  ?Skin: ?   General: Skin is warm.  ?   Comments: Scattered areas of hyperpigmentation/scaly skin on b/l UE  ?Neurological:  ?   General: No focal deficit present.  ?   Mental Status: She is alert.  ?Psychiatric:     ?   Mood and Affect: Mood normal.     ?   Behavior: Behavior normal.  ?   ?Assessment And Plan:  ?   ?1. Hypertensive nephropathy ?Comments: Chronic, fair control. No med changes. She is encouraged to follow low sodium diet. She will f/u in 4-6 months.  ?- CMP14+EGFR ?- Lipid panel ? ?2. Stage 3b chronic kidney disease ([place]) ?Comments: Chronic, reminded to stay well hydrated, keep BP controlled and avoid NSAIDs to help prevent progression of CKD.  ? ?3. Other urinary incontinence ?Comments: I will check urinalysis to r/o UTI.  ?- POCT Urinalysis Dipstick (81002) ? ?4. Dermatitis ?Comments: She was given rx triamcinolone cream to apply to affected area bid prn.  ? ?5. Other abnormal glucose ?Comments: Her a1c has been elevated in the past, I will recheck today.  ?- Hemoglobin A1c ? ?6. BMI 21.0-21.9, adult ?Comments: She has lost 2 lbs since Dec 2022. She was given samples of Boost/Ensure to drink in addition to two meals daily.  ?  ?Patient was given opportunity to ask questions. Patient verbalized understanding of the plan and was able to repeat key elements of the plan. All  questions were answered to their satisfaction.  ? ?I, Maximino Greenland, MD, have reviewed all documentation for this visit. The documentation on 04/16/21 for the exam, diagnosis, procedures, and orders are all accurate and complete.  ? ?IF YOU HAVE BEEN REFERRED TO A SPECIALIST, IT MAY TAKE 1-2 WEEKS TO SCHEDULE/PROCESS THE REFERRAL. IF YOU HAVE NOT HEARD FROM US/SPECIALIST IN TWO WEEKS, PLEASE GIVE [place] A CALL AT [phone] X [phone].  ? ?THE PATIENT IS ENCOURAGED TO PRACTICE SOCIAL DISTANCING DUE TO THE COVID-19 PANDEMIC.   ?

## 2021-04-16 NOTE — Progress Notes (Incomplete Revision)
?Rich Brave Llittleton,acting as a Education administrator for Maximino Greenland, MD.,have documented all relevant documentation on the behalf of Maximino Greenland, MD,as directed by  Maximino Greenland, MD while in the presence of Maximino Greenland, MD.  ?This visit occurred during the SARS-CoV-2 public health emergency.  Safety protocols were in place, including screening questions prior to the visit, additional usage of staff PPE, and extensive cleaning of exam room while observing appropriate contact time as indicated for disinfecting solutions. ? ?Subjective:  ?  ? Patient ID: Whitney Henderson , female    DOB: Sep 13, 1928 , 88 y.o.   MRN: [id number] ? ? ?Chief Complaint  ?Patient presents with  ?? Hypertension  ? ? ?HPI ? ?She presents for BP check. She reports compliance with meds. She is accompanied by her daughter, V. Today. She offers no concerns. Patient denies headaches, chest pain and shortness of breath. Daughter adds that she has nursing assistance during the week. She needs help with toileting, food preparation and other ADLs. States she must be reminded to eat during the day.  ? ?Hypertension ?This is a chronic problem. The current episode started more than 1 year ago. The problem has been gradually improving since onset. The problem is uncontrolled. Pertinent negatives include no blurred vision, chest pain, headaches, neck pain, orthopnea, palpitations or shortness of breath. The current treatment provides moderate improvement. Compliance problems include exercise.    ? ?Past Medical History:  ?Diagnosis Date  ?? Anxiety   ?? Cataracts, bilateral   ?? Discoid lupus   ?? Headache 04/27/2015  ? Left>right periorbital  ?? Hypertension   ?? Memory difficulties 09/04/2016  ?  ? ?Family History  ?Problem Relation Age of Onset  ?? Early death Mother   ?? Cancer Father   ?? Breast cancer Daughter   ?     56s  ? ? ? ?Current Outpatient Medications:  ??  acetaminophen (TYLENOL) 500 MG tablet, Take 500 mg by mouth every 6 (six) hours as  needed for mild pain or moderate pain., Disp: , Rfl:  ??  amLODipine (NORVASC) 2.5 MG tablet, TAKE 1 TABLET(2.5 MG) BY MOUTH TWICE DAILY, Disp: 180 tablet, Rfl: 1 ??  Apoaequorin (PREVAGEN PO), Take 1 capsule by mouth every other day., Disp: , Rfl:  ??  betamethasone dipropionate (DIPROLENE) 0.05 % cream, APPLY THIN LAYER EXTERNALLY TO THE AFFECTED AREA EVERY DAY AS NEEDED (Patient taking differently: 1 application. daily as needed (skin irritation).), Disp: 60 g, Rfl: 2 ??  Calcium Carb-Cholecalciferol (CALCIUM 500 +D PO), Take 1 tablet by mouth every other day., Disp: , Rfl:  ??  Multiple Vitamins-Minerals (CENTRUM SILVER PO), Take 1 tablet by mouth every other day., Disp: , Rfl:  ??  Propylene Glycol (SYSTANE BALANCE OP), Place 1 drop into both eyes daily as needed (for itchy eyes)., Disp: , Rfl:  ??  sertraline (ZOLOFT) 25 MG tablet, TAKE 1 TABLET(25 MG) BY MOUTH DAILY, Disp: 90 tablet, Rfl: 1 ??  thiamine 100 MG tablet, Take 100 mg by mouth daily., Disp: , Rfl:  ??  vitamin C (ASCORBIC ACID) 500 MG tablet, Take 500 mg by mouth every other day., Disp: , Rfl:  ??  triamcinolone cream (KENALOG) 0.1 %, APPLY TO AFFECTED AREA TWICE DAILY AS NEEDED, Disp: 45 g, Rfl: 0  ? ?Allergies  ?Allergen Reactions  ?? Codeine Rash  ?  ? ?Review of Systems  ?Constitutional: Negative.   ?Eyes:  Negative for blurred vision.  ?Respiratory: Negative.  Negative for shortness  of breath.   ?Cardiovascular: Negative.  Negative for chest pain, palpitations and orthopnea.  ?Genitourinary:  Positive for urgency.  ?Musculoskeletal:  Negative for neck pain.  ?Skin:  Positive for rash.  ?Neurological: Negative.  Negative for headaches.  ?Psychiatric/Behavioral: Negative.     ? ?Today's Vitals  ? 04/16/21 1113  ?BP: 132/70  ?Pulse: 99  ?Temp: 98.1 ?F (36.7 ?C)  ?Weight: 115 lb 6.4 oz (52.3 kg)  ?Height: 5' 0.8" (1.544 m)  ?PainSc: 0-No pain  ? ?Body mass index is 21.95 kg/m?.  ?Wt Readings from Last 3 Encounters:  ?04/16/21 115 lb 6.4 oz (52.3  kg)  ?01/01/21 117 lb 3.2 oz (53.2 kg)  ?12/20/20 116 lb 13.5 oz (53 kg)  ?  ? ?Objective:  ?Physical Exam ?Vitals and nursing note reviewed.  ?Constitutional:   ?   Appearance: Normal appearance.  ?HENT:  ?   Head: Normocephalic and atraumatic.  ?   Nose:  ?   Comments: Masked  ?   Mouth/Throat:  ?   Comments: Masked  ?Eyes:  ?   Extraocular Movements: Extraocular movements intact.  ?Cardiovascular:  ?   Rate and Rhythm: Normal rate and regular rhythm.  ?   Heart sounds: Normal heart sounds.  ?Pulmonary:  ?   Effort: Pulmonary effort is normal.  ?   Breath sounds: Normal breath sounds.  ?Musculoskeletal:  ?   Cervical back: Normal range of motion.  ?Skin: ?   General: Skin is warm.  ?   Comments: Scattered areas of hyperpigmentation/scaly skin on b/l UE  ?Neurological:  ?   General: No focal deficit present.  ?   Mental Status: She is alert.  ?Psychiatric:     ?   Mood and Affect: Mood normal.     ?   Behavior: Behavior normal.  ?   ?Assessment And Plan:  ?   ?1. Hypertensive nephropathy ?Comments: Chronic, fair control. No med changes. She is encouraged to follow low sodium diet. She will f/u in 4-6 months.  ?- CMP14+EGFR ?- Lipid panel ? ?2. Stage 3b chronic kidney disease ([place]) ?Comments: Chronic, reminded to stay well hydrated, keep BP controlled and avoid NSAIDs to help prevent progression of CKD.  ? ?3. Other urinary incontinence ?Comments: I will check urinalysis to r/o UTI.  ?- POCT Urinalysis Dipstick (81002) ? ?4. Dermatitis ?Comments: She was given rx triamcinolone cream to apply to affected area bid prn.  ? ?5. Other abnormal glucose ?Comments: Her a1c has been elevated in the past, I will recheck today.  ?- Hemoglobin A1c ? ?6. BMI 21.0-21.9, adult ?Comments: She has lost 2 lbs since Dec 2022. She was given samples of Boost/Ensure to drink in addition to two meals daily.  ?  ?Patient was given opportunity to ask questions. Patient verbalized understanding of the plan and was able to repeat key elements  of the plan. All questions were answered to their satisfaction.  ? ?I, Maximino Greenland, MD, have reviewed all documentation for this visit. The documentation on 04/16/21 for the exam, diagnosis, procedures, and orders are all accurate and complete.  ? ?IF YOU HAVE BEEN REFERRED TO A SPECIALIST, IT MAY TAKE 1-2 WEEKS TO SCHEDULE/PROCESS THE REFERRAL. IF YOU HAVE NOT HEARD FROM US/SPECIALIST IN TWO WEEKS, PLEASE GIVE [place] A CALL AT [phone].  ? ?THE PATIENT IS ENCOURAGED TO PRACTICE SOCIAL DISTANCING DUE TO THE COVID-19 PANDEMIC.   ?

## 2021-04-17 LAB — LIPID PANEL
Chol/HDL Ratio: 2.3 ratio (ref 0.0–4.4)
Cholesterol, Total: 217 mg/dL — ABNORMAL HIGH (ref 100–199)
HDL: 96 mg/dL (ref >39)
LDL Chol Calc (NIH): 109 mg/dL — ABNORMAL HIGH (ref 0–99)
Triglycerides: 70 mg/dL (ref 0–149)
VLDL Cholesterol Cal: 12 mg/dL (ref 5–40)

## 2021-04-17 LAB — CMP14+EGFR
ALT: 12 IU/L (ref 0–32)
AST: 23 IU/L (ref 0–40)
Albumin/Globulin Ratio: 1.3 (ref 1.2–2.2)
Albumin: 4.2 g/dL (ref 3.5–4.6)
Alkaline Phosphatase: 65 IU/L (ref 44–121)
BUN/Creatinine Ratio: 20 (ref 12–28)
BUN: 26 mg/dL (ref 10–36)
Bilirubin Total: 0.4 mg/dL (ref 0.0–1.2)
CO2: 24 mmol/L (ref 20–29)
Calcium: 9.2 mg/dL (ref 8.7–10.3)
Chloride: 99 mmol/L (ref 96–106)
Creatinine, Ser: 1.28 mg/dL — ABNORMAL HIGH (ref 0.57–1.00)
Globulin, Total: 3.3 g/dL (ref 1.5–4.5)
Glucose: 89 mg/dL (ref 70–99)
Potassium: 4.1 mmol/L (ref 3.5–5.2)
Sodium: 135 mmol/L (ref 134–144)
Total Protein: 7.5 g/dL (ref 6.0–8.5)
eGFR: 40 mL/min/{1.73_m2} — ABNORMAL LOW (ref >59)

## 2021-04-17 LAB — HEMOGLOBIN A1C
Est. average glucose Bld gHb Est-mCnc: 114 mg/dL
Hgb A1c MFr Bld: 5.6 % (ref 4.8–5.6)

## 2021-04-18 ENCOUNTER — Ambulatory Visit: Payer: Medicare Other | Admitting: Internal Medicine

## 2021-04-18 ENCOUNTER — Telehealth: Payer: Self-pay

## 2021-04-18 ENCOUNTER — Telehealth: Payer: Medicare Other

## 2021-04-18 NOTE — Telephone Encounter (Signed)
?  Care Management  ? ?Follow Up Note ? ? ?04/18/2021 ?Name: Whitney Henderson MRN: [id number] DOB: [birth year] ? ? ?Referred by: [name], MD ?Reason for referral : Chronic Care Management (Unsuccessful call) ? ? ?An unsuccessful telephone outreach was attempted today. The patient was referred to the case management team for assistance with care management and care coordination.  ? ?Follow Up Plan: The care management team will reach out to the patient again over the next 21 days.  ? ?Daneen Schick, BSW, CDP ?Social Worker, Certified Dementia Practitioner ?[facility] ?[phone] ? ?   ? ? ?

## 2021-04-20 DIAGNOSIS — I10 Essential (primary) hypertension: Secondary | ICD-10-CM

## 2021-04-20 DIAGNOSIS — I129 Hypertensive chronic kidney disease with stage 1 through stage 4 chronic kidney disease, or unspecified chronic kidney disease: Secondary | ICD-10-CM

## 2021-04-25 ENCOUNTER — Telehealth: Payer: Self-pay

## 2021-04-25 NOTE — Chronic Care Management (AMB) (Signed)
? ? ?Chronic Care Management ?Pharmacy Assistant  ? ?Name: Whitney Henderson  MRN: [id number] DOB: 1928/08/14 ? ?Reason for Encounter: Disease State/ Hypertension  ?  ?Recent office visits:  ?04-16-2021 [name], MD. Creatinine= 1.28, eGFR= 40. Cholesterol= 217, LDL= 109. Abnormal UA.  ? ?03-28-2021 Daneen Schick (CCM) ? ?03-06-2021 Little, Claudette Stapler, RN (CCM) ? ?Recent consult visits:  ?None ? ?Hospital visits:  ?None in previous 6 months ? ?Medications: ?Outpatient Encounter Medications as of 04/25/2021  ?Medication Sig  ? acetaminophen (TYLENOL) 500 MG tablet Take 500 mg by mouth every 6 (six) hours as needed for mild pain or moderate pain.  ? amLODipine (NORVASC) 2.5 MG tablet TAKE 1 TABLET(2.5 MG) BY MOUTH TWICE DAILY  ? Apoaequorin (PREVAGEN PO) Take 1 capsule by mouth every other day.  ? betamethasone dipropionate (DIPROLENE) 0.05 % cream APPLY THIN LAYER EXTERNALLY TO THE AFFECTED AREA EVERY DAY AS NEEDED (Patient taking differently: 1 application. daily as needed (skin irritation).)  ? Calcium Carb-Cholecalciferol (CALCIUM 500 +D PO) Take 1 tablet by mouth every other day.  ? Multiple Vitamins-Minerals (CENTRUM SILVER PO) Take 1 tablet by mouth every other day.  ? Propylene Glycol (SYSTANE BALANCE OP) Place 1 drop into both eyes daily as needed (for itchy eyes).  ? sertraline (ZOLOFT) 25 MG tablet TAKE 1 TABLET(25 MG) BY MOUTH DAILY  ? thiamine 100 MG tablet Take 100 mg by mouth daily.  ? triamcinolone cream (KENALOG) 0.1 % APPLY TO AFFECTED AREA TWICE DAILY AS NEEDED  ? vitamin C (ASCORBIC ACID) 500 MG tablet Take 500 mg by mouth every other day.  ? ?No facility-administered encounter medications on file as of 04/25/2021.  ? ?Reviewed chart prior to disease state call. Spoke with patient regarding BP ? ?Recent Office Vitals: ?BP Readings from Last 3 Encounters:  ?04/16/21 132/70  ?01/01/21 114/78  ?12/20/20 (!) 172/68  ? ?Pulse Readings from Last 3 Encounters:  ?04/16/21 99  ?01/01/21 81  ?12/20/20 68  ?   ?Wt Readings from Last 3 Encounters:  ?04/16/21 115 lb 6.4 oz (52.3 kg)  ?01/01/21 117 lb 3.2 oz (53.2 kg)  ?12/20/20 116 lb 13.5 oz (53 kg)  ?  ? ?Kidney Function ?Lab Results  ?Component Value Date/Time  ? CREATININE 1.28 (H) 04/16/2021 12:19 PM  ? CREATININE 1.27 (H) 01/01/2021 11:39 AM  ? GFRNONAA 54 (L) 12/20/2020 12:20 PM  ? GFRAA 40 (L) 02/28/2020 04:48 PM  ? ? ? ?  Latest Ref Rng & Units 04/16/2021  ? 12:19 PM 01/01/2021  ? 11:39 AM 12/20/2020  ? 12:27 PM  ?BMP  ?Glucose 70 - 99 mg/dL 89   77   123    ?BUN 10 - 36 mg/dL 26   27   19     ?Creatinine 0.57 - 1.00 mg/dL 1.28   1.27   0.90    ?BUN/Creat Ratio 12 - 28 20   21      ?Sodium 134 - 144 mmol/L 135   139   136    ?Potassium 3.5 - 5.2 mmol/L 4.1   4.5   3.4    ?Chloride 96 - 106 mmol/L 99   102   101    ?CO2 20 - 29 mmol/L 24   21     ?Calcium 8.7 - 10.3 mg/dL 9.2   9.5     ? ? ?Current antihypertensive regimen:  ?Amlodipine 2.5 mg twice daily ? ?How often are you checking your Blood Pressure? daily ? ?Current home BP readings: 136/80  54, 143/74 54 ? ?What recent interventions/DTPs have been made by any provider to improve Blood Pressure control since last CPP Visit:  ?-Educated on Daily salt intake goal < 2300 mg; ?Symptoms of hypotension and importance of maintaining adequate hydration; ?-Counseled to monitor BP at home at , document, and provide log at future appointments ?-Recommended to continue current medication ? ?Any recent hospitalizations or ED visits since last visit with CPP? No ? ?What diet changes have been made to improve Blood Pressure Control?  ?Patient states she has increased her water intake and has limited her salt intake. ? ?What exercise is being done to improve your Blood Pressure Control?  ?Patient stated she is still exercising and walking the track with her caregiver. ? ?Adherence Review: ?Is the patient currently on ACE/ARB medication? No ?Does the patient have >5 day gap between last estimated fill dates?  No ? ?NOTES: ?Patient's appointment with [name] in May was rescheduled to August. Patient was also reminded of next appointment with Dr. Baird Cancer. ? ?Care Gaps: ?AWV 07-19-2021 ?Covid booster overdue ? ?Star Rating Drugs: ?None ? ?Malecca Hicks CMA ?Clinical Pharmacist Assistant ?[phone] ? ?

## 2021-05-08 ENCOUNTER — Ambulatory Visit (INDEPENDENT_AMBULATORY_CARE_PROVIDER_SITE_OTHER): Payer: Medicare Other

## 2021-05-08 DIAGNOSIS — N1832 Chronic kidney disease, stage 3b: Secondary | ICD-10-CM

## 2021-05-08 DIAGNOSIS — I129 Hypertensive chronic kidney disease with stage 1 through stage 4 chronic kidney disease, or unspecified chronic kidney disease: Secondary | ICD-10-CM

## 2021-05-08 NOTE — Chronic Care Management (AMB) (Signed)
?Chronic Care Management  ? ? Social Work Note ? ?05/08/2021 ?Name: Whitney Henderson MRN: [id number] DOB: November 22, 1928 ? ?Whitney Henderson is a 88 y.o. year old female who is a primary care patient of Whitney Chard, MD. The CCM team was consulted to assist the patient with chronic disease management and/or care coordination needs related to:  HTN, Chronic Renal Disease Stage II, Hypertensive Nephropathy .  ? ?Engaged with patients daughter Whitney Henderson by phone  for follow up visit in response to provider referral for social work chronic care management and care coordination services.  ? ?Consent to Services:  ?The patient was given information about Chronic Care Management services, agreed to services, and gave verbal consent prior to initiation of services.  Please see initial visit note for detailed documentation.  ? ?Patient agreed to services and consent obtained.  ? ?Assessment: Review of patient past medical history, allergies, medications, and health status, including review of relevant consultants reports was performed today as part of a comprehensive evaluation and provision of chronic care management and care coordination services.    ? ?SDOH (Social Determinants of Health) assessments and interventions performed:   ? ?Advanced Directives Status: Not addressed in this encounter. ? ?CCM Care Plan ? ?Allergies  ?Allergen Reactions  ? Codeine Rash  ? ? ?Outpatient Encounter Medications as of 05/08/2021  ?Medication Sig  ? acetaminophen (TYLENOL) 500 MG tablet Take 500 mg by mouth every 6 (six) hours as needed for mild pain or moderate pain.  ? amLODipine (NORVASC) 2.5 MG tablet TAKE 1 TABLET(2.5 MG) BY MOUTH TWICE DAILY  ? Apoaequorin (PREVAGEN PO) Take 1 capsule by mouth every other day.  ? betamethasone dipropionate (DIPROLENE) 0.05 % cream APPLY THIN LAYER EXTERNALLY TO THE AFFECTED AREA EVERY DAY AS NEEDED (Patient taking differently: 1 application. daily as needed (skin irritation).)  ? Calcium Carb-Cholecalciferol  (CALCIUM 500 +D PO) Take 1 tablet by mouth every other day.  ? Multiple Vitamins-Minerals (CENTRUM SILVER PO) Take 1 tablet by mouth every other day.  ? Propylene Glycol (SYSTANE BALANCE OP) Place 1 drop into both eyes daily as needed (for itchy eyes).  ? sertraline (ZOLOFT) 25 MG tablet TAKE 1 TABLET(25 MG) BY MOUTH DAILY  ? thiamine 100 MG tablet Take 100 mg by mouth daily.  ? triamcinolone cream (KENALOG) 0.1 % APPLY TO AFFECTED AREA TWICE DAILY AS NEEDED  ? vitamin C (ASCORBIC ACID) 500 MG tablet Take 500 mg by mouth every other day.  ? ?No facility-administered encounter medications on file as of 05/08/2021.  ? ? ?Patient Active Problem List  ? Diagnosis Date Noted  ? Tremor 01/06/2020  ? Age-related osteoporosis with current pathological fracture 11/05/2018  ? Grief reaction 03/31/2018  ? Hypertensive nephropathy 03/03/2018  ? Chronic renal disease, stage III ([place]) 12/11/2017  ? Hypertension, essential, benign 10/30/2017  ? Anxiety 10/11/2017  ? Memory disorder 09/04/2016  ? Headache 04/27/2015  ? Hypertension 01/16/2013  ? UTI (lower urinary tract infection) 01/16/2013  ? ? ?Conditions to be addressed/monitored: HTN and CKD Stage II ; ADL IADL limitations ? ?Care Plan : Social Work Plan of Care  ?Updates made by Daneen Schick since 05/08/2021 12:00 AM  ?Completed 05/08/2021  ? ?Problem: Mobility and Independence Resolved 05/08/2021  ?  ? ?Goal: Obtain a caregiver in the home Completed 05/08/2021  ?Start Date: 12/28/2020  ?Recent Progress: On track  ?Priority: High  ?Note:   ?Current Barriers:  ?Chronic disease management support and education needs related to  HTN, Chronic Renal  Disease stage III, Hypertensive Nephropathy   ?Recent admission due to UTI ?Limited access to a caregiver ?Social Worker Clinical Goal(s):  ?patient will work with SW to identify and address any acute and/or chronic care coordination needs related to the self health management of  HTN, Chronic renal disease stage III, hypertensive  nephropathy   ?Patient will follow up with long term care insurance policy as directed by SW ?SW Interventions:  ?Inter-disciplinary care team collaboration (see longitudinal plan of care) ?Collaboration with Whitney Chard, MD regarding development and update of comprehensive plan of care as evidenced by provider attestation and co-signature ?Telephonic visit completed with patients daughter Whitney Henderson ?Confirmed patients long term care policy has been approved, patient likes the caregiver she has but is not sure she wants to use long-term ?Discussed with Whitney Henderson that if the patient chooses to end the caregiver relationship they will have to initiate a second long-term care insurance claim if needed for future needs ?Determined Whitney Henderson would like to keep the caregiver considering it allows her to take a break from being a caregiver ?Encouraged plan to continue caregiver for the next several months prior to deciding if the claim will be ended ? ?Patient Goals/Self-Care Activities ?patient will: with the help of her daughter ?-Continue engaging with long-term care plan as needed for an in home caregiver ? -  Engage with [facility] regarding caregiver needs ?-Contact SW as needed  ? ?  ?  ? ?Follow Up Plan:  No social work follow up planned at this time. The patient will remain engaged with RN Care Manager to address care management needs. ?     ?Daneen Schick, BSW, CDP ?Social Worker, Certified Dementia Practitioner ?[facility] [place] [facility] ?[phone] ? ?   ? ? ? ? ?

## 2021-05-08 NOTE — Patient Instructions (Signed)
Social Worker Visit Information ? ?Goals we discussed today:  ? Goals Addressed   ? ?  ?  ?  ?  ? This Visit's Progress  ?  COMPLETED: Obtain a Caregiver in the Home     ?  Timeframe:  Short-Term Goal ?Priority:  High ?Start Date: 12.8.22                            ? ?Patient Goals/Self-Care Activities ?patient will: with the help of her daughter ?-Continue engaging with long-term care plan as needed for an in home caregiver ? - Engage with [facility] regarding caregiver needs ?-Contact SW as needed  ? ?  ? ?  ?  ? ?Materials Provided: Verbal education about long term care insurance provided by phone ? ?The patient verbalized understanding of instructions, educational materials, and care plan provided today and declined offer to receive copy of patient instructions, educational materials, and care plan.  ? ?Follow Up Plan:  No SW follow up planned at this time. Please contact me as needed. ? ? ?Daneen Schick, BSW, CDP ?Social Worker, Certified Dementia Practitioner ?[facility] [place] [facility] ?[phone] ? ?   ? ?

## 2021-05-10 ENCOUNTER — Ambulatory Visit: Payer: Medicare Other | Admitting: Neurology

## 2021-05-10 VITALS — BP 130/80 | HR 70 | Ht 60.0 in | Wt 116.0 lb

## 2021-05-10 DIAGNOSIS — R251 Tremor, unspecified: Secondary | ICD-10-CM | POA: Diagnosis not present

## 2021-05-10 DIAGNOSIS — R413 Other amnesia: Secondary | ICD-10-CM | POA: Diagnosis not present

## 2021-05-10 NOTE — Progress Notes (Signed)
? ? ?Patient: Whitney Henderson ?Date of Birth: Jun 24, 1928 ? ?Reason for Visit: Follow up for memory, resting tremor, intermittent headache ?History from: Patient ?Primary Neurologist: Dr. Julius Henderson. Whitney Henderson ? ?ASSESSMENT AND PLAN ?88 y.o. year old female  ? ?1.  Mild memory disorder ?-Stable MMSE 22/30 ?-Stopped Aricept due to concern for weight loss, remains on Prevagen, could restart Aricept in the future if needed ?-Encouraged to continue exercise, activity ? ?2.  Resting tremor, right upper extremity ?-Appears overall stable, following for any signs of Parkinson's disease, none seen today other than tremor ?-Follow-up in 1 year or sooner if needed, will be followed by Dr. Krista Henderson since Dr. Jannifer Henderson has retired, they request to meet Dr. Krista Henderson at next appointment ? ?HISTORY OF PRESENT ILLNESS: ?Today 05/10/21 ?Whitney Henderson here today for follow-up. Lives alone with help of Whitney Henderson. Weight is the same. Off Aricept, stopped worried about weight. Noted no change, is on Prevagen. Whitney Henderson makes pill box. Doesn't drive. Walking varies. Using a cane. In November 2022 had a fall when had UTI. Rare headaches. Does exercises several days a week, walks in the park 15-20 minutes. Wants to live at home. Sleeps well, no dreams. Has aide/housekeeper. Denies any freezing spells. Tremor to right hand is stable only at rest, worse during anxiety.  MMSE 22/30. ? ?HISTORY  ?09/07/2020 Dr. Jannifer Henderson: Whitney Henderson is a 88 year old right-handed black female with a history of a mild memory disturbance.  The patient also has history of intermittent headaches that have not been extremely frequent for her.  She was seen in the emergency room on 25 April 2020 with a headache and some chest discomfort, her blood pressures were greater than 034 systolic at that time.  The patient comes in today after missing her morning dose of blood pressure medication, her blood pressures are elevated today.  She currently lives alone, but her Whitney Henderson helps her frequently.  She  is not operating a motor vehicle.  The Whitney Henderson will cook for her and then she will heat up leftovers.  She relies on her Whitney Henderson to help her with the finances and keep up with appointments.  She denies any significant problems of balance or any falls.  She has had some mild weight loss over the last 8 months, she has dropped 4 pounds since December 2021.  She is on Aricept taking 5 mg at night.  She denies any diarrhea or vivid dreams on the medication.  She does report some mild changes in memory over time. ? ?REVIEW OF SYSTEMS: Out of a complete 14 system review of symptoms, the patient complains only of the following symptoms, and all other reviewed systems are negative. ? ?See HPI ? ?ALLERGIES: ?Allergies  ?Allergen Reactions  ? Codeine Rash  ? ? ?HOME MEDICATIONS: ?Outpatient Medications Prior to Visit  ?Medication Sig Dispense Refill  ? acetaminophen (TYLENOL) 500 MG tablet Take 500 mg by mouth every 6 (six) hours as needed for mild pain or moderate pain.    ? amLODipine (NORVASC) 2.5 MG tablet TAKE 1 TABLET(2.5 MG) BY MOUTH TWICE DAILY 180 tablet 1  ? Apoaequorin (PREVAGEN PO) Take 1 capsule by mouth every other day.    ? betamethasone dipropionate (DIPROLENE) 0.05 % cream APPLY THIN LAYER EXTERNALLY TO THE AFFECTED AREA EVERY DAY AS NEEDED (Patient taking differently: 1 application. daily as needed (skin irritation).) 60 g 2  ? Calcium Carb-Cholecalciferol (CALCIUM 500 +D PO) Take 1 tablet by mouth every other day.    ? Multiple Vitamins-Minerals (CENTRUM  SILVER PO) Take 1 tablet by mouth every other day.    ? Propylene Glycol (SYSTANE BALANCE OP) Place 1 drop into both eyes daily as needed (for itchy eyes).    ? sertraline (ZOLOFT) 25 MG tablet TAKE 1 TABLET(25 MG) BY MOUTH DAILY 90 tablet 1  ? thiamine 100 MG tablet Take 100 mg by mouth daily.    ? triamcinolone cream (KENALOG) 0.1 % APPLY TO AFFECTED AREA TWICE DAILY AS NEEDED 45 g 0  ? vitamin C (ASCORBIC ACID) 500 MG tablet Take 500 mg by mouth every  other day.    ? ?No facility-administered medications prior to visit.  ? ? ?PAST MEDICAL HISTORY: ?Past Medical History:  ?Diagnosis Date  ? Anxiety   ? Cataracts, bilateral   ? Discoid lupus   ? Headache 04/27/2015  ? Left>right periorbital  ? Hypertension   ? Memory difficulties 09/04/2016  ? ? ?PAST SURGICAL HISTORY: ?Past Surgical History:  ?Procedure Laterality Date  ? ABDOMINAL HYSTERECTOMY    ? BREAST EXCISIONAL BIOPSY Left over 78 ys ago  ? benign  ? CHOLECYSTECTOMY    ? EXCISIONAL HEMORRHOIDECTOMY    ? ? ?FAMILY HISTORY: ?Family History  ?Problem Relation Age of Onset  ? Early death Mother   ? Cancer Father   ? Breast cancer Whitney Henderson   ?     61s  ? ? ?SOCIAL HISTORY: ?Social History  ? ?Socioeconomic History  ? Marital status: Widowed  ?  Spouse name: Whitney Henderson   ? Number of children: 4  ? Years of education: [place]  ? Highest education level: Not on file  ?Occupational History  ? Not on file  ?Tobacco Use  ? Smoking status: Former  ?  Years: 2.00  ?  Types: Cigarettes  ? Smokeless tobacco: Never  ? Tobacco comments:  ?  smoked sometimes not daily  ?Vaping Use  ? Vaping Use: Never used  ?Substance and Sexual Activity  ? Alcohol use: No  ? Drug use: No  ? Sexual activity: Not Currently  ?Other Topics Concern  ? Not on file  ?Social History Narrative  ? Live with husband,  4 children.   ? Caffeine: 1 cup daily   ? ?Social Determinants of Health  ? ?Financial Resource Strain: Low Risk   ? Difficulty of Paying Living Expenses: Not hard at all  ?Food Insecurity: No Food Insecurity  ? Worried About Charity fundraiser in the Last Year: Never true  ? Ran Out of Food in the Last Year: Never true  ?Transportation Needs: No Transportation Needs  ? Lack of Transportation (Medical): No  ? Lack of Transportation (Non-Medical): No  ?Physical Activity: Inactive  ? Days of Exercise per Week: 0 days  ? Minutes of Exercise per Session: 0 min  ?Stress: Stress Concern Present  ? Feeling of Stress : Very much  ?Social  Connections: Not on file  ?Intimate Partner Violence: Not on file  ? ?PHYSICAL EXAM ? ?Vitals:  ? 05/10/21 1043  ?BP: (!) 170/77  ?Pulse: 70  ?Weight: 116 lb (52.6 kg)  ?Height: 5' (1.524 m)  ? ?Body mass index is 22.65 kg/m?. ? ?  05/10/2021  ? 10:48 AM 09/07/2020  ? 10:51 AM 01/06/2020  ? 11:29 AM  ?MMSE - Mini Mental State Exam  ?Orientation to time '4 4 4  '$ ?Orientation to Place '5 5 5  '$ ?Registration '3 3 3  '$ ?Attention/ Calculation 0 3 1  ?Recall 1 0 3  ?Language- name 2 objects  $'2 2 2  'A$ ?Language- repeat 1 1 0  ?Language- follow 3 step command '3 3 3  '$ ?Language- read & follow direction '1 1 1  '$ ?Write a sentence '1 1 1  '$ ?Copy design 1 0 1  ?Total score '22 23 24  '$ ? ?Generalized: Well developed, in no acute distress  ?Neurological examination  ?Mentation: Alert oriented to time, place, history is right by the patient and Whitney Henderson.  Follows all commands speech and language fluent. Quiet, reserved, flat affect.  ?Cranial nerve II-XII: Pupils were equal round reactive to light. Extraocular movements were full, visual field were full on confrontational test. Facial sensation and strength were normal.  Head turning and shoulder shrug were normal and symmetric. ?Motor: Good strength overall, no bradykinesia was noted, resting tremor to right hand ?Sensory: Sensory testing is intact to soft touch on all 4 extremities. No evidence of extinction is noted.  ?Coordination: Cerebellar testing reveals good finger-nose-finger and heel-to-shin bilaterally. No tremor ?Gait and station: Able to get on bed with stool independently, gait is slightly wide based but steady, good pace, normal arm swing  ?Reflexes: Deep tendon reflexes are symmetric ? ?DIAGNOSTIC DATA (LABS, IMAGING, TESTING) ?- I reviewed patient records, labs, notes, testing and imaging myself where available. ? ?Lab Results  ?Component Value Date  ? WBC 11.1 (H) 12/20/2020  ? HGB 12.2 12/20/2020  ? HCT 36.0 12/20/2020  ? MCV 85.9 12/20/2020  ? PLT 166 12/20/2020  ? ?    ?Component Value Date/Time  ? NA 135 04/16/2021 1219  ? K 4.1 04/16/2021 1219  ? CL 99 04/16/2021 1219  ? CO2 24 04/16/2021 1219  ? GLUCOSE 89 04/16/2021 1219  ? GLUCOSE 123 (H) 12/20/2020 1227  ? BUN 26 03/27/202

## 2021-05-10 NOTE — Patient Instructions (Signed)
Monitor symptoms  ?Watch for any worsening  ?See you back in 1 year ?

## 2021-05-11 ENCOUNTER — Telehealth: Payer: Medicare Other

## 2021-05-11 ENCOUNTER — Ambulatory Visit: Payer: Self-pay

## 2021-05-11 DIAGNOSIS — M810 Age-related osteoporosis without current pathological fracture: Secondary | ICD-10-CM

## 2021-05-11 DIAGNOSIS — I10 Essential (primary) hypertension: Secondary | ICD-10-CM

## 2021-05-11 DIAGNOSIS — I129 Hypertensive chronic kidney disease with stage 1 through stage 4 chronic kidney disease, or unspecified chronic kidney disease: Secondary | ICD-10-CM

## 2021-05-11 DIAGNOSIS — N1832 Chronic kidney disease, stage 3b: Secondary | ICD-10-CM

## 2021-05-11 NOTE — Patient Instructions (Signed)
Visit Information ? ?Thank you for taking time to visit with me today. Please don't hesitate to contact me if I can be of assistance to you before our next scheduled telephone appointment. ? ?Following are the goals we discussed today:  ?(Copy and paste patient goals from clinical care plan here) ? ?Our next appointment is by telephone on 07/23/21 at 1:30 PM ? ?Please call the care guide team at [phone] if you need to cancel or reschedule your appointment.  ? ?If you are experiencing a Mental Health or [place] or need someone to talk to, please call [phone]-TALK (toll free, 24 hour hotline)  ? ?Patient verbalizes understanding of instructions and care plan provided today and agrees to view in [place]. Active MyChart status confirmed with patient.   ? ?Barb Merino, RN, BSN, CCM ?Care Management Coordinator ?[place] [facility]  ?Direct Phone: [phone] ? ? ?

## 2021-05-11 NOTE — Chronic Care Management (AMB) (Signed)
?Chronic Care Management  ? ?CCM RN Visit Note ? ?05/11/2021 ?Name: Whitney Henderson MRN: [id number] DOB: May 27, 1928 ? ?Subjective: ?Whitney Henderson is a 88 y.o. year old female who is a primary care patient of [name], MD. The care management team was consulted for assistance with disease management and care coordination needs.   ? ?Engaged with patient by telephone for follow up visit in response to provider referral for case management and/or care coordination services.  ? ?Consent to Services:  ?The patient was given information about Chronic Care Management services, agreed to services, and gave verbal consent prior to initiation of services.  Please see initial visit note for detailed documentation.  ? ?Patient agreed to services and verbal consent obtained.  ? ?Assessment: Review of patient past medical history, allergies, medications, health status, including review of consultants reports, laboratory and other test data, was performed as part of comprehensive evaluation and provision of chronic care management services.  ? ?SDOH (Social Determinants of Health) assessments and interventions performed:  Yes, no acute changes ? ?CCM Care Plan ? ?Allergies  ?Allergen Reactions  ? Codeine Rash  ? ? ?Outpatient Encounter Medications as of 05/11/2021  ?Medication Sig  ? acetaminophen (TYLENOL) 500 MG tablet Take 500 mg by mouth every 6 (six) hours as needed for mild pain or moderate pain.  ? amLODipine (NORVASC) 2.5 MG tablet TAKE 1 TABLET(2.5 MG) BY MOUTH TWICE DAILY  ? Apoaequorin (PREVAGEN PO) Take 1 capsule by mouth every other day.  ? betamethasone dipropionate (DIPROLENE) 0.05 % cream APPLY THIN LAYER EXTERNALLY TO THE AFFECTED AREA EVERY DAY AS NEEDED (Patient taking differently: 1 application. daily as needed (skin irritation).)  ? Calcium Carb-Cholecalciferol (CALCIUM 500 +D PO) Take 1 tablet by mouth every other day.  ? Multiple Vitamins-Minerals (CENTRUM SILVER PO) Take 1 tablet by mouth every other  day.  ? Propylene Glycol (SYSTANE BALANCE OP) Place 1 drop into both eyes daily as needed (for itchy eyes).  ? sertraline (ZOLOFT) 25 MG tablet TAKE 1 TABLET(25 MG) BY MOUTH DAILY  ? thiamine 100 MG tablet Take 100 mg by mouth daily.  ? triamcinolone cream (KENALOG) 0.1 % APPLY TO AFFECTED AREA TWICE DAILY AS NEEDED  ? vitamin C (ASCORBIC ACID) 500 MG tablet Take 500 mg by mouth every other day.  ? ?No facility-administered encounter medications on file as of 05/11/2021.  ? ? ?Patient Active Problem List  ? Diagnosis Date Noted  ? Tremor 01/06/2020  ? Age-related osteoporosis with current pathological fracture 11/05/2018  ? Grief reaction 03/31/2018  ? Hypertensive nephropathy 03/03/2018  ? Chronic renal disease, stage III ([place]) 12/11/2017  ? Hypertension, essential, benign 10/30/2017  ? Anxiety 10/11/2017  ? Memory disorder 09/04/2016  ? Headache 04/27/2015  ? Hypertension 01/16/2013  ? UTI (lower urinary tract infection) 01/16/2013  ? ? ?Conditions to be addressed/monitored: HTN, Chronic Renal Disease stage III, Hypertensive Nephropathy, osteoporosis ? ?Care Plan : RN Care Manager Plan of Care  ?Updates made by Lynne Logan, RN since 05/11/2021 12:00 AM  ?  ? ?Problem: No Plan of care established for management of chronic disease states (HTN, Chronic Renal Disease stage III, Hypertensive Nephropathy, osteoporosis)   ?Priority: High  ?  ? ?Long-Range Goal: Development of plan of care for chroinc disease management for HTN, Chronic Renal Disease stage III, Hypertensive Nephropathy, osteoporosis   ?Start Date: 11/23/2020  ?Expected End Date: 11/23/2021  ?Recent Progress: On track  ?Priority: High  ?Note:   ?Current Barriers:  ?Knowledge  Deficits related to plan of care for management of HTN, Chronic Renal Disease stage III, Hypertensive Nephropathy, osteoporosis ?Chronic Disease Management support and education needs related to HTN, Chronic Renal Disease stage III, Hypertensive Nephropathy, osteoporosis ? ?RNCM  Clinical Goal(s):  ?Patient will verbalize basic understanding of  HTN, Chronic Renal Disease stage III, Hypertensive Nephropathy, osteoporosis disease process and self health management plan   ?demonstrate Ongoing health management independence   ?continue to work with RN Care Manager to address care management and care coordination needs related to  HTN, Chronic Renal Disease stage III, Hypertensive Nephropathy, osteoporosis ?will demonstrate ongoing self health care management ability    through collaboration with RN Care manager, provider, and care team.  ? ?Interventions: ?1:1 collaboration with primary care provider regarding development and update of comprehensive plan of care as evidenced by provider attestation and co-signature ?Inter-disciplinary care team collaboration (see longitudinal plan of care) ?Evaluation of current treatment plan related to  self management and patient's adherence to plan as established by provider ? ?Hypertension Interventions:  (Status:  Goal on track:  Yes.) Long Term Goal ?Last practice recorded BP readings:  ?BP Readings from Last 3 Encounters:  ?05/10/21 130/80  ?04/16/21 132/70  ?01/01/21 114/78  ?Most recent eGFR/CrCl:  ?Lab Results  ?Component Value Date  ? EGFR 40 (L) 04/16/2021  ?  No components found for: CRCL ?Evaluation of current treatment plan related to hypertension self management and patient's adherence to plan as established by provider ?Review of patient status, including review of consultant's reports, relevant laboratory and other test results, and medications completed ?Provided education on prescribed diet low Sodium  ?Discussed PCP recommendations to increase protein, educated patient on foods high in protein, including fresh tuna, fresh salmon, and beans ? ? Chronic Kidney Disease Interventions:  (Status:  Goal on track:  Yes.) Long Term Goal ?Assessed the Patient understanding of chronic kidney disease    ?Evaluation of current treatment plan related to  chronic kidney disease self management and patient's adherence to plan as established by provider      ?Reviewed prescribed diet continue to increase water to 48 oz daily unless otherwise directed ?Provided education on kidney disease progression    ?Review of patient status, including review of consultant's reports, relevant laboratory and other test results, and medications completed ?Last practice recorded BP readings:  ?BP Readings from Last 3 Encounters:  ?05/10/21 130/80  ?04/16/21 132/70  ?01/01/21 114/78  ?Most recent eGFR/CrCl:  ?Lab Results  ?Component Value Date  ? EGFR 40 (L) 04/16/2021  ?  No components found for: CRCL ? ?Urinary Tract Infection Interventions:  (Status:  Goal Met.)  Short Term Goal ?Evaluation of current treatment plan related to  UTI ,  self-management and patient's adherence to plan as established by provider ?Assessed for symptoms suggestive of impaired urinary elimination and or urinary tract infection, patient denies ?Educated patient on the importance to increase her daily water intake, recommended 48-64 oz daily unless otherwise directed ?Educated on the importance to report reoccurring symptoms promptly in order to receive early treatment ?Discussed plans with patient for ongoing care management follow up and provided patient with direct contact information for care management team ? ?Patient Goals/Self-Care Activities: ?Take all medications as prescribed ?Attend all scheduled provider appointments ?Call provider office for new concerns or questions  ?drink 6 to 8 glasses of water each day ?take medications for blood pressure exactly as prescribed ? ?Follow Up Plan:  Telephone follow up appointment with care management team member  scheduled for:  07/23/21 ? ?  ? ?Barb Merino, RN, BSN, CCM ?Care Management Coordinator ?[place] [facility]  ?Direct Phone: [phone] ? ? ? ? ? ? ?

## 2021-05-20 DIAGNOSIS — I10 Essential (primary) hypertension: Secondary | ICD-10-CM

## 2021-05-20 DIAGNOSIS — I129 Hypertensive chronic kidney disease with stage 1 through stage 4 chronic kidney disease, or unspecified chronic kidney disease: Secondary | ICD-10-CM

## 2021-05-20 DIAGNOSIS — M810 Age-related osteoporosis without current pathological fracture: Secondary | ICD-10-CM

## 2021-05-25 ENCOUNTER — Telehealth: Payer: Medicare Other

## 2021-07-19 ENCOUNTER — Encounter: Payer: Self-pay | Admitting: Internal Medicine

## 2021-07-19 ENCOUNTER — Ambulatory Visit (INDEPENDENT_AMBULATORY_CARE_PROVIDER_SITE_OTHER): Payer: Medicare Other | Admitting: Internal Medicine

## 2021-07-19 ENCOUNTER — Ambulatory Visit (INDEPENDENT_AMBULATORY_CARE_PROVIDER_SITE_OTHER): Payer: Medicare Other

## 2021-07-19 VITALS — BP 150/70 | HR 60 | Temp 97.9°F | Ht 62.0 in | Wt 112.0 lb

## 2021-07-19 VITALS — BP 160/74 | HR 60 | Temp 97.9°F | Ht 62.0 in | Wt 112.0 lb

## 2021-07-19 DIAGNOSIS — N1832 Chronic kidney disease, stage 3b: Secondary | ICD-10-CM

## 2021-07-19 DIAGNOSIS — F4321 Adjustment disorder with depressed mood: Secondary | ICD-10-CM | POA: Diagnosis not present

## 2021-07-19 DIAGNOSIS — I129 Hypertensive chronic kidney disease with stage 1 through stage 4 chronic kidney disease, or unspecified chronic kidney disease: Secondary | ICD-10-CM

## 2021-07-19 DIAGNOSIS — Z6820 Body mass index (BMI) 20.0-20.9, adult: Secondary | ICD-10-CM | POA: Diagnosis not present

## 2021-07-19 DIAGNOSIS — Z0000 Encounter for general adult medical examination without abnormal findings: Secondary | ICD-10-CM | POA: Diagnosis not present

## 2021-07-19 NOTE — Progress Notes (Signed)
This visit occurred during the SARS-CoV-2 public health emergency.  Safety protocols were in place, including screening questions prior to the visit, additional usage of staff PPE, and extensive cleaning of exam room while observing appropriate contact time as indicated for disinfecting solutions.  Subjective:     Patient ID: Whitney Henderson , female    DOB: 04-07-1928 , 88 y.o.   MRN: [id number]   Chief Complaint  Patient presents with   Hypertension    HPI  She presents for BP check. She reports compliance with meds. She is accompanied by her daughter, V. Today. She offers no concerns. However, she appears to be more despondent than usual. Daughter adds her long-time neighbor/friend recently passed. Patient denies headaches, chest pain and shortness of breath.   She is also scheduled for [facility]/ [facility] Advisor.   Hypertension This is a chronic problem. The current episode started more than 1 year ago. The problem has been gradually improving since onset. The problem is uncontrolled. Pertinent negatives include no blurred vision, chest pain, headaches, neck pain, orthopnea, palpitations or shortness of breath. The current treatment provides moderate improvement. Compliance problems include exercise.      Past Medical History:  Diagnosis Date   Anxiety    Cataracts, bilateral    Discoid lupus    Headache 04/27/2015   Left>right periorbital   Hypertension    Memory difficulties 09/04/2016     Family History  Problem Relation Age of Onset   Early death Mother    Cancer Father    Breast cancer Daughter        4s     Current Outpatient Medications:    acetaminophen (TYLENOL) 500 MG tablet, Take 500 mg by mouth every 6 (six) hours as needed for mild pain or moderate pain., Disp: , Rfl:    amLODipine (NORVASC) 2.5 MG tablet, TAKE 1 TABLET(2.5 MG) BY MOUTH TWICE DAILY, Disp: 180 tablet, Rfl: 1   Apoaequorin (PREVAGEN PO), Take 1 capsule by mouth every other day., Disp: , Rfl:     betamethasone dipropionate (DIPROLENE) 0.05 % cream, APPLY THIN LAYER EXTERNALLY TO THE AFFECTED AREA EVERY DAY AS NEEDED (Patient taking differently: 1 application  daily as needed (skin irritation).), Disp: 60 g, Rfl: 2   Calcium Carb-Cholecalciferol (CALCIUM 500 +D PO), Take 1 tablet by mouth every other day., Disp: , Rfl:    Multiple Vitamins-Minerals (CENTRUM SILVER PO), Take 1 tablet by mouth every other day., Disp: , Rfl:    Propylene Glycol (SYSTANE BALANCE OP), Place 1 drop into both eyes daily as needed (for itchy eyes)., Disp: , Rfl:    sertraline (ZOLOFT) 25 MG tablet, TAKE 1 TABLET(25 MG) BY MOUTH DAILY, Disp: 90 tablet, Rfl: 1   thiamine 100 MG tablet, Take 100 mg by mouth daily., Disp: , Rfl:    triamcinolone cream (KENALOG) 0.1 %, APPLY TO AFFECTED AREA TWICE DAILY AS NEEDED, Disp: 45 g, Rfl: 0   vitamin C (ASCORBIC ACID) 500 MG tablet, Take 500 mg by mouth every other day., Disp: , Rfl:    Allergies  Allergen Reactions   Codeine Rash     Review of Systems  Constitutional: Negative.   Eyes:  Negative for blurred vision.  Respiratory: Negative.  Negative for shortness of breath.   Cardiovascular: Negative.  Negative for chest pain, palpitations and orthopnea.  Gastrointestinal: Negative.   Musculoskeletal:  Negative for neck pain.  Neurological: Negative.  Negative for headaches.  Psychiatric/Behavioral: Negative.  Today's Vitals   07/19/21 1134 07/19/21 1158  BP: (!) 150/70 (!) 160/74  Pulse: 60   Temp: 97.9 F (36.6 C)   Weight: 112 lb (50.8 kg)   Height: 5' 2"  (1.575 m)    Body mass index is 20.49 kg/m.   BP Readings from Last 3 Encounters:  07/19/21 (!) 160/74  07/19/21 (!) 150/70  05/10/21 130/80   Wt Readings from Last 3 Encounters:  07/19/21 112 lb (50.8 kg)  07/19/21 112 lb (50.8 kg)  05/10/21 116 lb (52.6 kg)    Objective:  Physical Exam Vitals and nursing note reviewed.  Constitutional:      Appearance: Normal appearance.  HENT:      Head: Normocephalic and atraumatic.  Eyes:     Extraocular Movements: Extraocular movements intact.  Cardiovascular:     Rate and Rhythm: Normal rate and regular rhythm.     Heart sounds: Normal heart sounds.  Pulmonary:     Effort: Pulmonary effort is normal.     Breath sounds: Normal breath sounds.  Musculoskeletal:     Cervical back: Normal range of motion.  Skin:    General: Skin is warm.  Neurological:     General: No focal deficit present.     Mental Status: She is alert.  Psychiatric:        Mood and Affect: Mood normal.        Behavior: Behavior normal.       Assessment And Plan:     1. Hypertensive nephropathy Comments: Uncontrolled, repeat BP is elevated. I will not change meds today, she agrees to f/u nurse visit in 3 weeks. Importance of dietary/med compliance stressed to pt - TSH - BMP8+EGFR  2. Stage 3b chronic kidney disease (HCC) Comments: Chronic, reminded to avoid NSAIDs and encouraged to keep BP controlled to decrease risk of CKD progression.  - PTH, intact and calcium - Phosphorus - Protein electrophoresis, serum  3. Grief Comments: She does not wish to pursue grief counseling at this time.   4. BMI 20.0-20.9, adult Comments: She has lost 4 lbs since April. She was given samples of Ensure to drink daily as a meal supplement, not replacement.  - Prealbumin   Patient was given opportunity to ask questions. Patient verbalized understanding of the plan and was able to repeat key elements of the plan. All questions were answered to their satisfaction.   I, Maximino Greenland, MD, have reviewed all documentation for this visit. The documentation on 07/19/21 for the exam, diagnosis, procedures, and orders are all accurate and complete.   IF YOU HAVE BEEN REFERRED TO A SPECIALIST, IT MAY TAKE 1-2 WEEKS TO SCHEDULE/PROCESS THE REFERRAL. IF YOU HAVE NOT HEARD FROM US/SPECIALIST IN TWO WEEKS, PLEASE GIVE [place] A CALL AT [phone].   THE PATIENT IS ENCOURAGED  TO PRACTICE SOCIAL DISTANCING DUE TO THE COVID-19 PANDEMIC.

## 2021-07-19 NOTE — Patient Instructions (Signed)
Ms. Fuhr , Thank you for taking time to come for your Medicare Wellness Visit. I appreciate your ongoing commitment to your health goals. Please review the following plan we discussed and let me know if I can assist you in the future.   Screening recommendations/referrals: Colonoscopy: not required Mammogram: due per patient Bone Density: completed 08/12/2018 Recommended yearly ophthalmology/optometry visit for glaucoma screening and checkup Recommended yearly dental visit for hygiene and checkup  Vaccinations: Influenza vaccine: due 08/21/2021 Pneumococcal vaccine: completed 12/04/2016 Tdap vaccine: completed 12/20/2020, due 12/21/2030 Shingles vaccine: completed   Covid-19: 06/20/2020, 12/09/2019, 03/01/2019, 02/09/2019  Advanced directives: Please bring a copy of your POA (Power of Attorney) and/or Living Will to your next appointment.    Conditions/risks identified: none  Next appointment: Follow up in one year for your annual wellness visit    Preventive Care 65 Years and Older, Female Preventive care refers to lifestyle choices and visits with your health care provider that can promote health and wellness. What does preventive care include? A yearly physical exam. This is also called an annual well check. Dental exams once or twice a year. Routine eye exams. Ask your health care provider how often you should have your eyes checked. Personal lifestyle choices, including: Daily care of your teeth and gums. Regular physical activity. Eating a healthy diet. Avoiding tobacco and drug use. Limiting alcohol use. Practicing safe sex. Taking low-dose aspirin every day. Taking vitamin and mineral supplements as recommended by your health care provider. What happens during an annual well check? The services and screenings done by your health care provider during your annual well check will depend on your age, overall health, lifestyle risk factors, and family history of  disease. Counseling  Your health care provider may ask you questions about your: Alcohol use. Tobacco use. Drug use. Emotional well-being. Home and relationship well-being. Sexual activity. Eating habits. History of falls. Memory and ability to understand (cognition). Work and work Statistician. Reproductive health. Screening  You may have the following tests or measurements: Height, weight, and BMI. Blood pressure. Lipid and cholesterol levels. These may be checked every 5 years, or more frequently if you are over 45 years old. Skin check. Lung cancer screening. You may have this screening every year starting at age 70 if you have a 30-pack-year history of smoking and currently smoke or have quit within the past 15 years. Fecal occult blood test (FOBT) of the stool. You may have this test every year starting at age 29. Flexible sigmoidoscopy or colonoscopy. You may have a sigmoidoscopy every 5 years or a colonoscopy every 10 years starting at age 9. Hepatitis C blood test. Hepatitis B blood test. Sexually transmitted disease (STD) testing. Diabetes screening. This is done by checking your blood sugar (glucose) after you have not eaten for a while (fasting). You may have this done every 1-3 years. Bone density scan. This is done to screen for osteoporosis. You may have this done starting at age 39. Mammogram. This may be done every 1-2 years. Talk to your health care provider about how often you should have regular mammograms. Talk with your health care provider about your test results, treatment options, and if necessary, the need for more tests. Vaccines  Your health care provider may recommend certain vaccines, such as: Influenza vaccine. This is recommended every year. Tetanus, diphtheria, and acellular pertussis (Tdap, Td) vaccine. You may need a Td booster every 10 years. Zoster vaccine. You may need this after age 32. Pneumococcal 13-valent conjugate (PCV13)  vaccine. One  dose is recommended after age 34. Pneumococcal polysaccharide (PPSV23) vaccine. One dose is recommended after age 43. Talk to your health care provider about which screenings and vaccines you need and how often you need them. This information is not intended to replace advice given to you by your health care provider. Make sure you discuss any questions you have with your health care provider. Document Released: 02/03/2015 Document Revised: 09/27/2015 Document Reviewed: 11/08/2014 Elsevier Interactive Patient Education  2017 [place] Prevention in the Home Falls can cause injuries. They can happen to people of all ages. There are many things you can do to make your home safe and to help prevent falls. What can I do on the outside of my home? Regularly fix the edges of walkways and driveways and fix any cracks. Remove anything that might make you trip as you walk through a door, such as a raised step or threshold. Trim any bushes or trees on the path to your home. Use bright outdoor lighting. Clear any walking paths of anything that might make someone trip, such as rocks or tools. Regularly check to see if handrails are loose or broken. Make sure that both sides of any steps have handrails. Any raised decks and porches should have guardrails on the edges. Have any leaves, snow, or ice cleared regularly. Use sand or salt on walking paths during winter. Clean up any spills in your garage right away. This includes oil or grease spills. What can I do in the bathroom? Use night lights. Install grab bars by the toilet and in the tub and shower. Do not use towel bars as grab bars. Use non-skid mats or decals in the tub or shower. If you need to sit down in the shower, use a plastic, non-slip stool. Keep the floor dry. Clean up any water that spills on the floor as soon as it happens. Remove soap buildup in the tub or shower regularly. Attach bath mats securely with double-sided  non-slip rug tape. Do not have throw rugs and other things on the floor that can make you trip. What can I do in the bedroom? Use night lights. Make sure that you have a light by your bed that is easy to reach. Do not use any sheets or blankets that are too big for your bed. They should not hang down onto the floor. Have a firm chair that has side arms. You can use this for support while you get dressed. Do not have throw rugs and other things on the floor that can make you trip. What can I do in the kitchen? Clean up any spills right away. Avoid walking on wet floors. Keep items that you use a lot in easy-to-reach places. If you need to reach something above you, use a strong step stool that has a grab bar. Keep electrical cords out of the way. Do not use floor polish or wax that makes floors slippery. If you must use wax, use non-skid floor wax. Do not have throw rugs and other things on the floor that can make you trip. What can I do with my stairs? Do not leave any items on the stairs. Make sure that there are handrails on both sides of the stairs and use them. Fix handrails that are broken or loose. Make sure that handrails are as long as the stairways. Check any carpeting to make sure that it is firmly attached to the stairs. Fix any carpet that is loose  or worn. Avoid having throw rugs at the top or bottom of the stairs. If you do have throw rugs, attach them to the floor with carpet tape. Make sure that you have a light switch at the top of the stairs and the bottom of the stairs. If you do not have them, ask someone to add them for you. What else can I do to help prevent falls? Wear shoes that: Do not have high heels. Have rubber bottoms. Are comfortable and fit you well. Are closed at the toe. Do not wear sandals. If you use a stepladder: Make sure that it is fully opened. Do not climb a closed stepladder. Make sure that both sides of the stepladder are locked into place. Ask  someone to hold it for you, if possible. Clearly mark and make sure that you can see: Any grab bars or handrails. First and last steps. Where the edge of each step is. Use tools that help you move around (mobility aids) if they are needed. These include: Canes. Walkers. Scooters. Crutches. Turn on the lights when you go into a dark area. Replace any light bulbs as soon as they burn out. Set up your furniture so you have a clear path. Avoid moving your furniture around. If any of your floors are uneven, fix them. If there are any pets around you, be aware of where they are. Review your medicines with your doctor. Some medicines can make you feel dizzy. This can increase your chance of falling. Ask your doctor what other things that you can do to help prevent falls. This information is not intended to replace advice given to you by your health care provider. Make sure you discuss any questions you have with your health care provider. Document Released: 11/03/2008 Document Revised: 06/15/2015 Document Reviewed: 02/11/2014 Elsevier Interactive Patient Education  2017 [employer].

## 2021-07-19 NOTE — Progress Notes (Signed)
Subjective:   Whitney Henderson is a 88 y.o. female who presents for Medicare Annual (Subsequent) preventive examination.  Review of Systems     Cardiac Risk Factors include: advanced age (>40mn, >>43women);hypertension     Objective:    Today's Vitals   07/19/21 1052 07/19/21 1120 07/19/21 1130  BP: (!) 160/90 (!) 150/70 (!) 150/70  Pulse: (!) 50 60   Temp: 97.9 F (36.6 C)    TempSrc: Oral    SpO2: 95%    Weight: 112 lb (50.8 kg)    Height: '5\' 2"'$  (1.575 m)     Body mass index is 20.49 kg/m.     07/19/2021   11:05 AM 12/20/2020   11:43 AM 06/29/2020   11:28 AM 04/25/2020    2:55 PM 12/02/2019    9:28 AM 12/01/2018   10:53 AM 10/13/2018    2:23 PM  Advanced Directives  Does Patient Have a Medical Advance Directive? Yes No Yes No Yes Yes Yes  Type of AParamedicof APalmerLiving will  HDowsLiving will  H[place]Living will [place]Living will HTri-[place]akes Does patient want to make changes to medical advance directive?       No - Patient declined  Copy of HRushfordin Chart? No - copy requested  No - copy requested  No - copy requested No - copy requested No - copy requested  Would patient like information on creating a medical advance directive?    No - Patient declined       Current Medications (verified) Outpatient Encounter Medications as of 07/19/2021  Medication Sig   acetaminophen (TYLENOL) 500 MG tablet Take 500 mg by mouth every 6 (six) hours as needed for mild pain or moderate pain.   amLODipine (NORVASC) 2.5 MG tablet TAKE 1 TABLET(2.5 MG) BY MOUTH TWICE DAILY   Apoaequorin (PREVAGEN PO) Take 1 capsule by mouth every other day.   betamethasone dipropionate (DIPROLENE) 0.05 % cream APPLY THIN LAYER EXTERNALLY TO THE AFFECTED AREA EVERY DAY AS NEEDED (Patient taking differently: 1 application  daily as needed (skin irritation).)   Calcium  Carb-Cholecalciferol (CALCIUM 500 +D PO) Take 1 tablet by mouth every other day.   Multiple Vitamins-Minerals (CENTRUM SILVER PO) Take 1 tablet by mouth every other day.   Propylene Glycol (SYSTANE BALANCE OP) Place 1 drop into both eyes daily as needed (for itchy eyes).   sertraline (ZOLOFT) 25 MG tablet TAKE 1 TABLET(25 MG) BY MOUTH DAILY   thiamine 100 MG tablet Take 100 mg by mouth daily.   triamcinolone cream (KENALOG) 0.1 % APPLY TO AFFECTED AREA TWICE DAILY AS NEEDED   vitamin C (ASCORBIC ACID) 500 MG tablet Take 500 mg by mouth every other day.   No facility-administered encounter medications on file as of 07/19/2021.    Allergies (verified) Codeine   History: Past Medical History:  Diagnosis Date   Anxiety    Cataracts, bilateral    Discoid lupus    Headache 04/27/2015   Left>right periorbital   Hypertension    Memory difficulties 09/04/2016   Past Surgical History:  Procedure Laterality Date   ABDOMINAL HYSTERECTOMY     BREAST EXCISIONAL BIOPSY Left over 256ys ago   benign   CHOLECYSTECTOMY     EXCISIONAL HEMORRHOIDECTOMY     Family History  Problem Relation Age of Onset   Early death Mother    Cancer Father  Breast cancer Daughter        57s   Social History   Socioeconomic History   Marital status: Widowed    Spouse name: Whitney Henderson    Number of children: 4   Years of education: College- Masters   [employer] education level: Not on file  Occupational History   Not on file  Tobacco Use   Smoking status: Former    Years: 2.00    Types: Cigarettes   Smokeless tobacco: Never   Tobacco comments:    smoked sometimes not daily  Vaping Use   Vaping Use: Never used  Substance and Sexual Activity   Alcohol use: No   Drug use: No   Sexual activity: Not Currently  Other Topics Concern   Not on file  Social History Narrative   Live with husband,  4 children.    Caffeine: 1 cup daily    Social Determinants of Health   Financial Resource Strain: Low Risk   (07/19/2021)   Overall Financial Resource Strain (CARDIA)    Difficulty of Paying Living Expenses: Not hard at all  Food Insecurity: No Food Insecurity (07/19/2021)   Hunger Vital Sign    Worried About Running Out of Food in the Last Year: Never true    Ran Out of Food in the Last Year: Never true  Transportation Needs: No Transportation Needs (07/19/2021)   PRAPARE - Hydrologist (Medical): No    Lack of Transportation (Non-Medical): No  Physical Activity: Inactive (07/19/2021)   Exercise Vital Sign    Days of Exercise per Week: 0 days    Minutes of Exercise per Session: 0 min  Stress: Stress Concern Present (07/19/2021)   [place]    Feeling of Stress : To some extent  Social Connections: Not on file    Tobacco Counseling Counseling given: Not Answered Tobacco comments: smoked sometimes not daily   Clinical Intake:  Pre-visit preparation completed: Yes  Pain : No/denies pain     Nutritional Status: BMI of 19-24  Normal Nutritional Risks: None Diabetes: No  How often do you need to have someone help you when you read instructions, pamphlets, or other written materials from your doctor or pharmacy?: 1 - Never  Diabetic? no  Interpreter Needed?: No  Information entered by :: Whitney Henderson   Activities of Daily Living    07/19/2021   11:08 AM  In your present state of health, do you have any difficulty performing the following activities:  Hearing? 0  Vision? 1  Comment some blurriness sometimes  Difficulty concentrating or making decisions? 1  Walking or climbing stairs? 0  Dressing or bathing? 1  Comment daughter assists  Preparing Food and eating ? Y  Using the Toilet? N  In the past six months, have you accidently leaked urine? Y  Do you have problems with loss of bowel control? N  Managing your Medications? Y  Managing your Finances? Y    Patient Care  Team: Whitney Chard, MD as PCP - General (Internal Medicine) Whitney Fam, MD as Consulting Physician (Ophthalmology) Whitney Kras Claudette Stapler, RN as Case Manager Whitney Henderson, [facility] (Pharmacist)  Indicate any recent Medical Services you may have received from other than [facility] providers in the past year (date may be approximate).     Assessment:   This is a routine wellness examination for Whitney Henderson.  Hearing/Vision screen Vision Screening - Comments:: Regular eye exam, [place]  Dietary  issues and exercise activities discussed: Current Exercise Habits: The patient does not participate in regular exercise at present   Goals Addressed             This Visit's Progress    Patient Stated       07/19/2021, try to stay healthy       Depression Screen    07/19/2021   11:08 AM 06/29/2020   11:29 AM 12/02/2019    9:29 AM 12/02/2019    8:44 AM 05/19/2019   10:28 AM 12/01/2018   10:54 AM 10/13/2018   10:08 AM  PHQ 2/9 Scores  PHQ - 2 Score 1 0  '3 1 1 1  '$ PHQ- 9 Score    '6 2 2 2  '$ Exception Documentation   Other- indicate reason in comment box      Not completed   lready completed by CMA        Fall Risk    07/19/2021   11:06 AM 06/29/2020   11:28 AM 12/02/2019    9:28 AM 12/02/2019    8:43 AM 12/01/2018   10:54 AM  Fall Risk   Falls in the past year? 1 0 0 0 0  Comment had an UTI      Number falls in past yr: 0    0  Injury with Fall? 0      Risk for fall due to : Impaired balance/gait;Impaired mobility;Medication side effect Medication side effect Medication side effect  Medication side effect  Follow up Falls evaluation completed;Education provided;Falls prevention discussed Falls evaluation completed;Education provided;Falls prevention discussed Falls evaluation completed;Education provided;Falls prevention discussed  Falls evaluation completed;Education provided;Falls prevention discussed    FALL RISK PREVENTION PERTAINING TO THE HOME:  Any stairs in or around the home? Yes   If so, are there any without handrails? No  Home free of loose throw rugs in walkways, pet beds, electrical cords, etc? Yes  Adequate lighting in your home to reduce risk of falls? Yes   ASSISTIVE DEVICES UTILIZED TO PREVENT FALLS:  Life alert? Yes  Use of a cane, walker or w/c? Yes  Grab bars in the bathroom? Yes  Shower chair or bench in shower? Yes  Elevated toilet seat or a handicapped toilet? Yes   TIMED UP AND GO:  Was the test performed? No .    Gait slow and steady with assistive device  Cognitive Function:    05/10/2021   10:48 AM 09/07/2020   10:51 AM 01/06/2020   11:29 AM 07/14/2019   11:51 AM 09/04/2016    9:29 AM  MMSE - Mini Mental State Exam  Orientation to time '4 4 4 4 4  '$ Orientation to Place '5 5 5 5 5  '$ Registration '3 3 3 3 3  '$ Attention/ Calculation 0 '3 1 3 5  '$ Recall 1 0 3 1 0  Language- name 2 objects '2 2 2 2 2  '$ Language- repeat 1 1 0 1 1  Language- follow 3 step command '3 3 3 3 3  '$ Language- read & follow direction '1 1 1 1 1  '$ Write a sentence '1 1 1 1 1  '$ Copy design 1 0 1 0 1  Total score '22 23 24 24 26        '$ 06/29/2020   11:30 AM 12/02/2019    9:31 AM 12/01/2018   10:58 AM 10/30/2017   10:59 AM  6CIT Screen  What Year? 4 points 0 points 0 points 0 points  What month? 0 points  0 points 0 points 0 points  What time? 0 points 0 points 0 points 0 points  Count back from 20 0 points 0 points 0 points 0 points  Months in reverse 2 points 4 points 4 points 2 points  Repeat phrase 10 points 8 points 10 points 2 points  Total Score 16 points 12 points 14 points 4 points    Immunizations Immunization History  Administered Date(s) Administered   Fluad Quad(high Dose 65+) 09/25/2018   Influenza, High Dose Seasonal PF 10/02/2016, 10/14/2019   Influenza-Unspecified 10/22/2017   PFIZER(Purple Top)SARS-COV-2 Vaccination 02/09/2019, 03/01/2019, 12/09/2019, 06/20/2020   Pneumococcal Conjugate-13 11/28/2015   Pneumococcal Polysaccharide-23 12/04/2016    Tdap 12/20/2020   Zoster Recombinat (Shingrix) 09/25/2018, 12/04/2018    TDAP status: Up to date  Flu Vaccine status: Up to date  Pneumococcal vaccine status: Up to date  Covid-19 vaccine status: Completed vaccines  Qualifies for Shingles Vaccine? Yes   Zostavax completed Yes   Shingrix Completed?: Yes  Screening Tests Health Maintenance  Topic Date Due   COVID-19 Vaccine (5 - Booster for Pfizer series) 08/15/2020   INFLUENZA VACCINE  08/21/2021   TETANUS/TDAP  12/21/2030   Pneumonia Vaccine 75+ Years old  Completed   DEXA SCAN  Completed   Zoster Vaccines- Shingrix  Completed   HPV VACCINES  Aged Out    Health Maintenance  Health Maintenance Due  Topic Date Due   COVID-19 Vaccine (5 - Booster for Strawberry series) 08/15/2020    Colorectal cancer screening: No longer required.   Mammogram status: due per patient  Bone Density status: Completed 08/12/2018.   Lung Cancer Screening: (Low Dose CT Chest recommended if Age 62-80 years, 30 pack-year currently smoking OR have quit w/in 15years.) does not qualify.   Lung Cancer Screening Referral: no  Additional Screening:  Hepatitis C Screening: does not qualify;   Vision Screening: Recommended annual ophthalmology exams for early detection of glaucoma and other disorders of the eye. Is the patient up to date with their annual eye exam?  Yes  Who is the provider or what is the name of the office in which the patient attends annual eye exams? Dr. Herbert Deaner If pt is not established with a provider, would they like to be referred to a provider to establish care? No .   Dental Screening: Recommended annual dental exams for proper oral hygiene  Community Resource Referral / Chronic Care Management: CRR required this visit?  No   CCM required this visit?  No      Plan:     I have personally reviewed and noted the following in the patient's chart:   Medical and social history Use of alcohol, tobacco or illicit drugs   Current medications and supplements including opioid prescriptions.  Functional ability and status Nutritional status Physical activity Advanced directives List of other physicians Hospitalizations, surgeries, and ER visits in previous 12 months Vitals Screenings to include cognitive, depression, and falls Referrals and appointments  In addition, I have reviewed and discussed with patient certain preventive protocols, quality metrics, and best practice recommendations. A written personalized care plan for preventive services as well as general preventive health recommendations were provided to patient.     Kellie Simmering, Henderson   [id number]   Nurse Notes: 6 CIT not administered. Patient has diagnosis of memory disorder.

## 2021-07-23 ENCOUNTER — Telehealth: Payer: Medicare Other

## 2021-07-24 LAB — BMP8+EGFR
BUN/Creatinine Ratio: 21 (ref 12–28)
BUN: 29 mg/dL (ref 10–36)
CO2: 22 mmol/L (ref 20–29)
Calcium: 9.4 mg/dL (ref 8.7–10.3)
Chloride: 103 mmol/L (ref 96–106)
Creatinine, Ser: 1.38 mg/dL — ABNORMAL HIGH (ref 0.57–1.00)
Glucose: 76 mg/dL (ref 70–99)
Potassium: 4.5 mmol/L (ref 3.5–5.2)
Sodium: 140 mmol/L (ref 134–144)
eGFR: 36 mL/min/{1.73_m2} — ABNORMAL LOW (ref >59)

## 2021-07-24 LAB — PTH, INTACT AND CALCIUM: PTH: 20 pg/mL (ref 15–65)

## 2021-07-24 LAB — PROTEIN ELECTROPHORESIS, SERUM
A/G Ratio: 1.1 (ref 0.7–1.7)
Albumin ELP: 3.6 g/dL (ref 2.9–4.4)
Alpha 1: 0.2 g/dL (ref 0.0–0.4)
Alpha 2: 0.8 g/dL (ref 0.4–1.0)
Beta: 1 g/dL (ref 0.7–1.3)
Gamma Globulin: 1.3 g/dL (ref 0.4–1.8)
Globulin, Total: 3.4 g/dL (ref 2.2–3.9)
Total Protein: 7 g/dL (ref 6.0–8.5)

## 2021-07-24 LAB — PREALBUMIN: PREALBUMIN: 20 mg/dL (ref 9–32)

## 2021-07-24 LAB — TSH: TSH: 2.28 u[IU]/mL (ref 0.450–4.500)

## 2021-07-24 LAB — PHOSPHORUS: Phosphorus: 3.4 mg/dL (ref 3.0–4.3)

## 2021-08-10 ENCOUNTER — Telehealth: Payer: Medicare Other

## 2021-08-10 ENCOUNTER — Ambulatory Visit: Payer: Self-pay

## 2021-08-10 DIAGNOSIS — M810 Age-related osteoporosis without current pathological fracture: Secondary | ICD-10-CM

## 2021-08-10 DIAGNOSIS — I10 Essential (primary) hypertension: Secondary | ICD-10-CM

## 2021-08-10 DIAGNOSIS — N1832 Chronic kidney disease, stage 3b: Secondary | ICD-10-CM

## 2021-08-10 DIAGNOSIS — I129 Hypertensive chronic kidney disease with stage 1 through stage 4 chronic kidney disease, or unspecified chronic kidney disease: Secondary | ICD-10-CM

## 2021-08-10 NOTE — Patient Instructions (Signed)
Visit Information  Thank you for taking time to visit with me today. Please don't hesitate to contact me if I can be of assistance to you before our next scheduled telephone appointment.  Following are the goals we discussed today:  Take all medications as prescribed Attend all scheduled provider appointments Call pharmacy for medication refills 3-7 days in advance of running out of medications Call provider office for new concerns or questions  drink 6 to 8 glasses of water each day check blood pressure 3 times per week take blood pressure log to all doctor appointments call doctor for signs and symptoms of high blood pressure take medications for blood pressure exactly as prescribed  Our next appointment is by telephone on 09/07/21 at 01:30 PM   Please call the care guide team at [phone] if you need to cancel or reschedule your appointment.   If you are experiencing a Mental Health or [place] or need someone to talk to, please call [phone]-TALK (toll free, 24 hour hotline)   The patient verbalized understanding of instructions, educational materials, and care plan provided today and agreed to receive a mailed copy of patient instructions, educational materials, and care plan.   Barb Merino, RN, BSN, CCM Care Management Coordinator [facility]  Direct Phone: [phone]

## 2021-08-10 NOTE — Chronic Care Management (AMB) (Signed)
Care Management    RN Visit Note  08/10/2021 Name: GEORGIE EDUARDO MRN: [id number] DOB: 07-18-1928  Subjective: Parke Poisson Botkins is a 88 y.o. year old female who is a primary care patient of [name], MD. The care management team was consulted for assistance with disease management and care coordination needs.    Engaged with patient by telephone for follow up visit in response to provider referral for case management and/or care coordination services.   Consent to Services:   Ms. Alkema was given information about Care Management services today including:  Care Management services includes personalized support from designated clinical staff supervised by her physician, including individualized plan of care and coordination with other care providers 24/7 contact phone numbers for assistance for urgent and routine care needs. The patient may stop case management services at any time by phone call to the office staff.  Patient agreed to services and consent obtained.   Assessment: Review of patient past medical history, allergies, medications, health status, including review of consultants reports, laboratory and other test data, was performed as part of comprehensive evaluation and provision of chronic care management services.   SDOH (Social Determinants of Health) assessments and interventions performed:  Yes, no acute needs   Care Plan  Allergies  Allergen Reactions   Codeine Rash    Outpatient Encounter Medications as of 08/10/2021  Medication Sig   acetaminophen (TYLENOL) 500 MG tablet Take 500 mg by mouth every 6 (six) hours as needed for mild pain or moderate pain.   amLODipine (NORVASC) 2.5 MG tablet TAKE 1 TABLET(2.5 MG) BY MOUTH TWICE DAILY   Apoaequorin (PREVAGEN PO) Take 1 capsule by mouth every other day.   betamethasone dipropionate (DIPROLENE) 0.05 % cream APPLY THIN LAYER EXTERNALLY TO THE AFFECTED AREA EVERY DAY AS NEEDED (Patient taking differently: 1  application  daily as needed (skin irritation).)   Calcium Carb-Cholecalciferol (CALCIUM 500 +D PO) Take 1 tablet by mouth every other day.   Multiple Vitamins-Minerals (CENTRUM SILVER PO) Take 1 tablet by mouth every other day.   Propylene Glycol (SYSTANE BALANCE OP) Place 1 drop into both eyes daily as needed (for itchy eyes).   sertraline (ZOLOFT) 25 MG tablet TAKE 1 TABLET(25 MG) BY MOUTH DAILY   thiamine 100 MG tablet Take 100 mg by mouth daily.   triamcinolone cream (KENALOG) 0.1 % APPLY TO AFFECTED AREA TWICE DAILY AS NEEDED   vitamin C (ASCORBIC ACID) 500 MG tablet Take 500 mg by mouth every other day.   No facility-administered encounter medications on file as of 08/10/2021.    Patient Active Problem List   Diagnosis Date Noted   Tremor 01/06/2020   Age-related osteoporosis with current pathological fracture 11/05/2018   Grief reaction 03/31/2018   Hypertensive nephropathy 03/03/2018   Chronic renal disease, stage III ([place]) 12/11/2017   Hypertension, essential, benign 10/30/2017   Anxiety 10/11/2017   Memory disorder 09/04/2016   Headache 04/27/2015   Hypertension 01/16/2013   UTI (lower urinary tract infection) 01/16/2013    Conditions to be addressed/monitored:  HTN, Chronic Renal Disease stage III, Hypertensive Nephropathy, osteoporosis  Care Plan : RN Care Manager Plan of Care  Updates made by Lynne Logan, RN since 08/10/2021 12:00 AM  Completed 08/10/2021   Problem: No Plan of care established for management of chronic disease states (HTN, Chronic Renal Disease stage III, Hypertensive Nephropathy, osteoporosis) Resolved 08/10/2021  Priority: High     Long-Range Goal: Development of plan of care for chroinc  disease management for HTN, Chronic Renal Disease stage III, Hypertensive Nephropathy, osteoporosis Completed 08/10/2021  Start Date: 11/23/2020  Expected End Date: 11/23/2021  Recent Progress: On track  Priority: High  Note:   Current Barriers:  Knowledge  Deficits related to plan of care for management of HTN, Chronic Renal Disease stage III, Hypertensive Nephropathy, osteoporosis Chronic Disease Management support and education needs related to HTN, Chronic Renal Disease stage III, Hypertensive Nephropathy, osteoporosis  RNCM Clinical Goal(s):  Patient will verbalize basic understanding of  HTN, Chronic Renal Disease stage III, Hypertensive Nephropathy, osteoporosis disease process and self health management plan   demonstrate Ongoing health management independence   continue to work with RN Care Manager to address care management and care coordination needs related to  HTN, Chronic Renal Disease stage III, Hypertensive Nephropathy, osteoporosis will demonstrate ongoing self health care management ability    through collaboration with RN Care manager, provider, and care team.   Interventions: 1:1 collaboration with primary care provider regarding development and update of comprehensive plan of care as evidenced by provider attestation and co-signature Inter-disciplinary care team collaboration (see longitudinal plan of care) Evaluation of current treatment plan related to  self management and patient's adherence to plan as established by provider  Hypertension Interventions:  (Status:  Goal Met.) Long Term Goal Last practice recorded BP readings:  BP Readings from Last 3 Encounters:  07/19/21 (!) 160/74  07/19/21 (!) 150/70  05/10/21 130/80  Most recent eGFR/CrCl:  Lab Results  Component Value Date   EGFR 36 (L) 07/19/2021    No components found for: "CRCL" Evaluation of current treatment plan related to hypertension self management and patient's adherence to plan as established by provider Advised patient, providing education and rationale, to monitor blood pressure daily and record, calling PCP for findings outside established parameters Advised patient to discuss new symptoms or concerns with provider Provided education on prescribed  diet low Sodium Discussed complications of poorly controlled blood pressure such as heart disease, stroke, circulatory complications, vision complications, kidney impairment, sexual dysfunction   Chronic Kidney Disease Interventions:  (Status:  Goal Met.) Long Term Goal Assessed the Patient understanding of chronic kidney disease    Evaluation of current treatment plan related to chronic kidney disease self management and patient's adherence to plan as established by provider      Reviewed prescribed diet continue to increase your water intake to 48 oz daily unless otherwise directed by your doctor  Discussed complications of poorly controlled blood pressure such as heart disease, stroke, circulatory complications, vision complications, kidney impairment, sexual dysfunction    Provided education on kidney disease progression    Last practice recorded BP readings:  BP Readings from Last 3 Encounters:  07/19/21 (!) 160/74  07/19/21 (!) 150/70  05/10/21 130/80  Most recent eGFR/CrCl:  Lab Results  Component Value Date   EGFR 36 (L) 07/19/2021    No components found for: "CRCL"  Urinary Tract Infection Interventions:  (Status:  Goal Met.)  Short Term Goal Evaluation of current treatment plan related to  UTI ,  self-management and patient's adherence to plan as established by provider Assessed for symptoms suggestive of impaired urinary elimination and or urinary tract infection, patient denies Educated patient on the importance to increase her daily water intake, recommended 48-64 oz daily unless otherwise directed Educated on the importance to report reoccurring symptoms promptly in order to receive early treatment Discussed plans with patient for ongoing care management follow up and provided patient with direct contact  information for care management team  Dementia:  (Status:  Goal Met.)  Long Term Goal Evaluation of current treatment plan related to memory loss  Review of patient status,  including review of consultant's reports, relevant laboratory and other test results, and medications completed. Reviewed medications with patient and discussed importance of medication adherence Reviewed and discussed recent Neuro follow up with the following Assessment/Plan noted: Assessment and Plan  88 y.o. year old female  1.  Mild memory disorder -Stable MMSE 22/30 -Stopped Aricept due to concern for weight loss, remains on Prevagen, could restart Aricept in the future if needed -Encouraged to continue exercise, activity 2.  Resting tremor, right upper extremity -Appears overall stable, following for any signs of Parkinson's disease, none seen today other than tremor -Follow-up in 1 year or sooner if needed, will be followed by Dr. Krista Blue since Dr. Jannifer Franklin has retired, they request to meet Dr. Krista Blue at next appointment  Determined patient verbalizes understanding of her prescribed treatment plan  Instructed patient to notify PCP of new or worsening symptoms related to memory loss and or tremor   Patient Goals/Self-Care Activities: Take all medications as prescribed Attend all scheduled provider appointments Call pharmacy for medication refills 3-7 days in advance of running out of medications Call provider office for new concerns or questions  drink 6 to 8 glasses of water each day check blood pressure 3 times per week take blood pressure log to all doctor appointments call doctor for signs and symptoms of high blood pressure take medications for blood pressure exactly as prescribed  Follow Up Plan:  patient transitioned to [place], RN, BSN, CCM Care Management Coordinator [facility]  Direct Phone: [phone]

## 2021-08-22 ENCOUNTER — Telehealth: Payer: Self-pay

## 2021-08-22 NOTE — Chronic Care Management (AMB) (Signed)
    Whitney Henderson was reminded to have all medications, supplements and any blood glucose and blood pressure readings available for review with [name], Pharm. D, at her telephone visit on 08-24-2021 at 12:00   [place] Pharmacist Assistant [phone]

## 2021-08-24 ENCOUNTER — Telehealth: Payer: Medicare Other

## 2021-09-07 ENCOUNTER — Ambulatory Visit: Payer: Self-pay

## 2021-09-07 NOTE — Patient Instructions (Signed)
Visit Information  Thank you for taking time to visit with me today. Please don't hesitate to contact me if I can be of assistance to you.   Following are the goals we discussed today:   Goals Addressed       Patient Stated     "I've had a lot of death around me lately" (pt-stated)        Care Coordination Interventions: Evaluation of current treatment plan related to grief and patient's adherence to plan as established by provider Determined patient continues to struggle with grief  Educated patient regarding grief counseling, patient is agreeable to LCSW referral   Encouraged patient to continue to discuss her feelings with her family  Advised patient to notify her PCP if symptoms worsen or persist Sent referral to LCSW [name] requesting she assist patient with grief counseling      Our next appointment is by telephone on 11/22/21 at 09:00 AM   Please call the care guide team at [phone] if you need to cancel or reschedule your appointment.   If you are experiencing a Mental Health or [place] or need someone to talk to, please call 1-800-273-TALK (toll free, 24 hour hotline)  The patient verbalized understanding of instructions, educational materials, and care plan provided today and agreed to receive a mailed copy of patient instructions, educational materials, and care plan.   Barb Merino, RN, BSN, CCM Care Management Coordinator [facility] Care Management Direct Phone: [phone]

## 2021-09-07 NOTE — Patient Outreach (Addendum)
  Care Coordination   Follow Up Visit Note   09/07/2021 Name: Whitney Henderson MRN: [id number] DOB: September 08, 1928  Whitney Henderson is a 88 y.o. year old female who sees Whitney Chard, MD for primary care. I spoke with  Whitney Henderson by phone today  What matters to the patients health and wellness today?  Patient would like to receive grief counseling.    Goals Addressed       Patient Stated     "I've had a lot of death around me lately" (pt-stated)        Care Coordination Interventions: Evaluation of current treatment plan related to grief and patient's adherence to plan as established by provider Determined patient continues to struggle with grief  Educated patient regarding grief counseling, patient is agreeable to LCSW referral   Encouraged patient to continue to discuss her feelings with her family  Advised patient to notify her PCP if symptoms worsen or persist Sent referral to LCSW Whitney Henderson requesting she assist patient with grief counseling      SDOH assessments and interventions completed:  Yes     Care Coordination Interventions Activated:  Yes  Care Coordination Interventions:  Yes, provided   Follow up plan: Referral made to LCSW Follow up call scheduled for 11/22/21 '@09'$ :00 AM     Encounter Outcome:  Pt. Visit Completed

## 2021-09-17 ENCOUNTER — Other Ambulatory Visit: Payer: Self-pay | Admitting: Internal Medicine

## 2021-09-17 DIAGNOSIS — I10 Essential (primary) hypertension: Secondary | ICD-10-CM

## 2021-09-18 ENCOUNTER — Ambulatory Visit: Payer: Self-pay | Admitting: Licensed Clinical Social Worker

## 2021-09-20 NOTE — Patient Instructions (Signed)
Visit Information  Thank you for taking time to visit with me today. Please don't hesitate to contact me if I can be of assistance to you.   Following are the goals we discussed today:   Goals Addressed             This Visit's Progress    Management of Anxiety symptoms   On track    Care Coordination Interventions: Active listening / Reflection utilized  Emotional Support Provided Provided psychoeducation for mental health needs:Grief support  Verbalization of feelings encouraged          Our next appointment is by telephone on 10/02/21 at 12 PM  Please call the care guide team at [phone] if you need to cancel or reschedule your appointment.   If you are experiencing a Mental Health or [place] or need someone to talk to, please call the Suicide and Crisis Lifeline: 988 call 911   The patient verbalized understanding of instructions, educational materials, and care plan provided today and DECLINED offer to receive copy of patient instructions, educational materials, and care plan.   [name], MSW, [email or web address] Phone [phone] 10:29 AM

## 2021-09-27 DIAGNOSIS — H40013 Open angle with borderline findings, low risk, bilateral: Secondary | ICD-10-CM | POA: Diagnosis not present

## 2021-10-02 ENCOUNTER — Ambulatory Visit: Payer: Self-pay | Admitting: Licensed Clinical Social Worker

## 2021-10-09 NOTE — Patient Instructions (Signed)
Visit Information  Thank you for taking time to visit with me today. Please don't hesitate to contact me if I can be of assistance to you.   Following are the goals we discussed today:   Goals Addressed             This Visit's Progress    Management of Anxiety symptoms       Care Coordination Interventions: Active listening / Reflection utilized  Emotional Support Provided Provided psychoeducation for mental health needs:Grief support  Verbalization of feelings encouraged  Pt reports "pretty good" management of anxiety symptoms. Trigger is being alone and extensive grief LCSW provided support with pt identifying healthy coping skills to assist with management of symptoms. Pt enjoys reading, exercising at [facility], attending church, garden club Pt is in need of transportation resources. LCSW provided supportive resources, including, SCAT application and contact # for Medicare to inquire about their transportation program          Our next appointment is by telephone on 10/16/21 at 10 AM  Please call the care guide team at [phone] if you need to cancel or reschedule your appointment.   If you are experiencing a Mental Health or [place] or need someone to talk to, please call the Suicide and Crisis Lifeline: 988 call 911   The patient verbalized understanding of instructions, educational materials, and care plan provided today and DECLINED offer to receive copy of patient instructions, educational materials, and care plan.   [name], MSW, [email or web address] Phone [phone] 10:17 AM

## 2021-10-09 NOTE — Patient Outreach (Signed)
  Care Coordination   Initial Visit Note   10/09/2021 Name: Whitney Henderson MRN: [id number] DOB: January 24, 1928  Whitney Henderson is a 88 y.o. year old female who sees [name], MD for primary care. I spoke with  [name] by phone today.  What matters to the patients health and wellness today?  Grief Support and Transportation Resources    Goals Addressed             This Visit's Progress    Management of Anxiety symptoms       Care Coordination Interventions: Active listening / Reflection utilized  Emotional Support Provided Provided psychoeducation for mental health needs:Grief support  Verbalization of feelings encouraged  Pt reports "pretty good" management of anxiety symptoms. Trigger is being alone and extensive grief LCSW provided support with pt identifying healthy coping skills to assist with management of symptoms. Pt enjoys reading, exercising at [facility], attending church, garden club Pt is in need of transportation resources. LCSW provided supportive resources, including, SCAT application and contact # for Medicare to inquire about their transportation program          SDOH assessments and interventions completed:  Yes  SDOH Interventions Today    Flowsheet Row Most Recent Value  SDOH Interventions   Housing Interventions Intervention Not Indicated  Transportation Interventions SCAT ([employer]), Other (Comment)  [Medicare Transportation]        Care Coordination Interventions Activated:  Yes  Care Coordination Interventions:  Yes, provided   Follow up plan: Follow up call scheduled for 10/16/21 at 10 AM    Encounter Outcome:  Pt. Visit Completed   [name], MSW, [email or web address] Phone [phone] 10:16 AM

## 2021-10-16 ENCOUNTER — Encounter: Payer: Self-pay | Admitting: Licensed Clinical Social Worker

## 2021-10-16 ENCOUNTER — Telehealth: Payer: Self-pay | Admitting: Licensed Clinical Social Worker

## 2021-10-16 NOTE — Patient Outreach (Signed)
  Care Coordination   10/16/2021 Name: Whitney Henderson MRN: [id number] DOB: October 11, 1928   Care Coordination Outreach Attempts: An unsuccessful scheduled telephone outreach was attempted today. LCSW was unable to leave a voice message, due to prompt options  Follow Up Plan:  Additional outreach attempts will be made to offer the patient care coordination information and services.   Encounter Outcome:  No Answer  Care Coordination Interventions Activated:  No   Care Coordination Interventions:  No, not indicated    [name], MSW, [email or web address] Phone [phone] 10:04 AM

## 2021-10-16 NOTE — Patient Outreach (Signed)
  Care Coordination   10/16/2021 Name: Whitney Henderson MRN: [id number] DOB: 06/16/1928   Care Coordination Outreach Attempts:  An unsuccessful scheduled telephone outreach was attempted today. LCSW left a detailed message requesting a return call  Follow Up Plan:  LCSW will wait for a return call and/or make additional attempts   Encounter Outcome:  No Answer  Care Coordination Interventions Activated:  No   Care Coordination Interventions:  No, not indicated    [name], MSW, [name] Phone [phone] 5:23 PM

## 2021-11-02 ENCOUNTER — Inpatient Hospital Stay (HOSPITAL_COMMUNITY): Payer: Medicare Other | Admitting: Anesthesiology

## 2021-11-02 ENCOUNTER — Inpatient Hospital Stay (HOSPITAL_COMMUNITY)
Admission: EM | Admit: 2021-11-02 | Discharge: 2021-11-06 | DRG: 522 | Disposition: A | Discharge status: Skilled Nursing Facility | Payer: Medicare Other | Attending: Internal Medicine | Admitting: Internal Medicine

## 2021-11-02 ENCOUNTER — Emergency Department (HOSPITAL_COMMUNITY): Payer: Medicare Other

## 2021-11-02 ENCOUNTER — Encounter (HOSPITAL_COMMUNITY): Payer: Self-pay | Admitting: Emergency Medicine

## 2021-11-02 ENCOUNTER — Inpatient Hospital Stay (HOSPITAL_COMMUNITY): Payer: Medicare Other

## 2021-11-02 ENCOUNTER — Other Ambulatory Visit: Payer: Self-pay

## 2021-11-02 DIAGNOSIS — S72002D Fracture of unspecified part of neck of left femur, subsequent encounter for closed fracture with routine healing: Secondary | ICD-10-CM | POA: Diagnosis not present

## 2021-11-02 DIAGNOSIS — Z87891 Personal history of nicotine dependence: Secondary | ICD-10-CM

## 2021-11-02 DIAGNOSIS — W19XXXA Unspecified fall, initial encounter: Secondary | ICD-10-CM

## 2021-11-02 DIAGNOSIS — L930 Discoid lupus erythematosus: Secondary | ICD-10-CM | POA: Diagnosis not present

## 2021-11-02 DIAGNOSIS — Z90710 Acquired absence of both cervix and uterus: Secondary | ICD-10-CM | POA: Diagnosis not present

## 2021-11-02 DIAGNOSIS — D696 Thrombocytopenia, unspecified: Secondary | ICD-10-CM | POA: Diagnosis not present

## 2021-11-02 DIAGNOSIS — G629 Polyneuropathy, unspecified: Secondary | ICD-10-CM | POA: Diagnosis not present

## 2021-11-02 DIAGNOSIS — Z96642 Presence of left artificial hip joint: Secondary | ICD-10-CM | POA: Diagnosis not present

## 2021-11-02 DIAGNOSIS — Z79899 Other long term (current) drug therapy: Secondary | ICD-10-CM

## 2021-11-02 DIAGNOSIS — Z9889 Other specified postprocedural states: Secondary | ICD-10-CM | POA: Diagnosis not present

## 2021-11-02 DIAGNOSIS — N183 Chronic kidney disease, stage 3 unspecified: Secondary | ICD-10-CM | POA: Diagnosis present

## 2021-11-02 DIAGNOSIS — N1832 Chronic kidney disease, stage 3b: Secondary | ICD-10-CM | POA: Diagnosis not present

## 2021-11-02 DIAGNOSIS — S0990XA Unspecified injury of head, initial encounter: Secondary | ICD-10-CM | POA: Diagnosis not present

## 2021-11-02 DIAGNOSIS — R413 Other amnesia: Secondary | ICD-10-CM | POA: Diagnosis not present

## 2021-11-02 DIAGNOSIS — Z9049 Acquired absence of other specified parts of digestive tract: Secondary | ICD-10-CM

## 2021-11-02 DIAGNOSIS — S72009A Fracture of unspecified part of neck of unspecified femur, initial encounter for closed fracture: Secondary | ICD-10-CM | POA: Diagnosis present

## 2021-11-02 DIAGNOSIS — D631 Anemia in chronic kidney disease: Secondary | ICD-10-CM | POA: Diagnosis not present

## 2021-11-02 DIAGNOSIS — R4181 Age-related cognitive decline: Secondary | ICD-10-CM | POA: Diagnosis present

## 2021-11-02 DIAGNOSIS — Z043 Encounter for examination and observation following other accident: Secondary | ICD-10-CM | POA: Diagnosis not present

## 2021-11-02 DIAGNOSIS — N189 Chronic kidney disease, unspecified: Secondary | ICD-10-CM | POA: Diagnosis not present

## 2021-11-02 DIAGNOSIS — Z885 Allergy status to narcotic agent status: Secondary | ICD-10-CM

## 2021-11-02 DIAGNOSIS — R404 Transient alteration of awareness: Secondary | ICD-10-CM | POA: Diagnosis not present

## 2021-11-02 DIAGNOSIS — R251 Tremor, unspecified: Secondary | ICD-10-CM | POA: Diagnosis not present

## 2021-11-02 DIAGNOSIS — F419 Anxiety disorder, unspecified: Secondary | ICD-10-CM | POA: Diagnosis present

## 2021-11-02 DIAGNOSIS — R9431 Abnormal electrocardiogram [ECG] [EKG]: Secondary | ICD-10-CM | POA: Diagnosis not present

## 2021-11-02 DIAGNOSIS — S72002A Fracture of unspecified part of neck of left femur, initial encounter for closed fracture: Secondary | ICD-10-CM | POA: Diagnosis not present

## 2021-11-02 DIAGNOSIS — Y92009 Unspecified place in unspecified non-institutional (private) residence as the place of occurrence of the external cause: Secondary | ICD-10-CM | POA: Diagnosis not present

## 2021-11-02 DIAGNOSIS — R4189 Other symptoms and signs involving cognitive functions and awareness: Secondary | ICD-10-CM | POA: Diagnosis present

## 2021-11-02 DIAGNOSIS — S8992XA Unspecified injury of left lower leg, initial encounter: Secondary | ICD-10-CM | POA: Diagnosis not present

## 2021-11-02 DIAGNOSIS — Z7401 Bed confinement status: Secondary | ICD-10-CM | POA: Diagnosis not present

## 2021-11-02 DIAGNOSIS — R6889 Other general symptoms and signs: Secondary | ICD-10-CM | POA: Diagnosis not present

## 2021-11-02 DIAGNOSIS — M6281 Muscle weakness (generalized): Secondary | ICD-10-CM | POA: Diagnosis not present

## 2021-11-02 DIAGNOSIS — I129 Hypertensive chronic kidney disease with stage 1 through stage 4 chronic kidney disease, or unspecified chronic kidney disease: Secondary | ICD-10-CM | POA: Diagnosis not present

## 2021-11-02 DIAGNOSIS — Z7982 Long term (current) use of aspirin: Secondary | ICD-10-CM

## 2021-11-02 DIAGNOSIS — M810 Age-related osteoporosis without current pathological fracture: Secondary | ICD-10-CM | POA: Diagnosis not present

## 2021-11-02 DIAGNOSIS — D62 Acute posthemorrhagic anemia: Secondary | ICD-10-CM | POA: Diagnosis not present

## 2021-11-02 DIAGNOSIS — I10 Essential (primary) hypertension: Secondary | ICD-10-CM | POA: Diagnosis present

## 2021-11-02 DIAGNOSIS — M79605 Pain in left leg: Secondary | ICD-10-CM | POA: Diagnosis not present

## 2021-11-02 DIAGNOSIS — G8911 Acute pain due to trauma: Secondary | ICD-10-CM | POA: Diagnosis not present

## 2021-11-02 DIAGNOSIS — W010XXA Fall on same level from slipping, tripping and stumbling without subsequent striking against object, initial encounter: Secondary | ICD-10-CM | POA: Diagnosis present

## 2021-11-02 DIAGNOSIS — D649 Anemia, unspecified: Secondary | ICD-10-CM | POA: Diagnosis present

## 2021-11-02 DIAGNOSIS — Z743 Need for continuous supervision: Secondary | ICD-10-CM | POA: Diagnosis not present

## 2021-11-02 DIAGNOSIS — R2681 Unsteadiness on feet: Secondary | ICD-10-CM | POA: Diagnosis not present

## 2021-11-02 DIAGNOSIS — M47812 Spondylosis without myelopathy or radiculopathy, cervical region: Secondary | ICD-10-CM | POA: Diagnosis not present

## 2021-11-02 DIAGNOSIS — R2689 Other abnormalities of gait and mobility: Secondary | ICD-10-CM | POA: Diagnosis not present

## 2021-11-02 DIAGNOSIS — Z9181 History of falling: Secondary | ICD-10-CM | POA: Diagnosis not present

## 2021-11-02 DIAGNOSIS — Z471 Aftercare following joint replacement surgery: Secondary | ICD-10-CM | POA: Diagnosis not present

## 2021-11-02 LAB — URINALYSIS, ROUTINE W REFLEX MICROSCOPIC
Bilirubin Urine: NEGATIVE
Glucose, UA: NEGATIVE mg/dL
Hgb urine dipstick: NEGATIVE
Ketones, ur: NEGATIVE mg/dL
Nitrite: POSITIVE — AB
Protein, ur: NEGATIVE mg/dL
Specific Gravity, Urine: 1.011 (ref 1.005–1.030)
pH: 7 (ref 5.0–8.0)

## 2021-11-02 LAB — CBC WITH DIFFERENTIAL/PLATELET
Abs Immature Granulocytes: 0.04 10*3/uL (ref 0.00–0.07)
Basophils Absolute: 0 10*3/uL (ref 0.0–0.1)
Basophils Relative: 1 %
Eosinophils Absolute: 0 10*3/uL (ref 0.0–0.5)
Eosinophils Relative: 0 %
HCT: 34 % — ABNORMAL LOW (ref 36.0–46.0)
Hemoglobin: 11.5 g/dL — ABNORMAL LOW (ref 12.0–15.0)
Immature Granulocytes: 1 %
Lymphocytes Relative: 13 %
Lymphs Abs: 0.8 10*3/uL (ref 0.7–4.0)
MCH: 29.4 pg (ref 26.0–34.0)
MCHC: 33.8 g/dL (ref 30.0–36.0)
MCV: 87 fL (ref 80.0–100.0)
Monocytes Absolute: 0.5 10*3/uL (ref 0.1–1.0)
Monocytes Relative: 8 %
Neutro Abs: 4.8 10*3/uL (ref 1.7–7.7)
Neutrophils Relative %: 77 %
Platelets: 146 10*3/uL — ABNORMAL LOW (ref 150–400)
RBC: 3.91 MIL/uL (ref 3.87–5.11)
RDW: 14.1 % (ref 11.5–15.5)
WBC: 6.2 10*3/uL (ref 4.0–10.5)
nRBC: 0 % (ref 0.0–0.2)

## 2021-11-02 LAB — BASIC METABOLIC PANEL
Anion gap: 11 (ref 5–15)
BUN: 23 mg/dL (ref 8–23)
CO2: 21 mmol/L — ABNORMAL LOW (ref 22–32)
Calcium: 9.2 mg/dL (ref 8.9–10.3)
Chloride: 106 mmol/L (ref 98–111)
Creatinine, Ser: 1.23 mg/dL — ABNORMAL HIGH (ref 0.44–1.00)
GFR, Estimated: 41 mL/min — ABNORMAL LOW (ref >60)
Glucose, Bld: 109 mg/dL — ABNORMAL HIGH (ref 70–99)
Potassium: 4 mmol/L (ref 3.5–5.1)
Sodium: 138 mmol/L (ref 135–145)

## 2021-11-02 MED ORDER — HYDROCODONE-ACETAMINOPHEN 5-325 MG PO TABS
1.0000 | ORAL_TABLET | Freq: Four times a day (QID) | ORAL | Status: DC | PRN
  Administered 2021-11-02 – 2021-11-03 (×2): 1 via ORAL
  Filled 2021-11-02 (×2): qty 1

## 2021-11-02 MED ORDER — ENOXAPARIN SODIUM 40 MG/0.4ML IJ SOSY
40.0000 mg | PREFILLED_SYRINGE | INTRAMUSCULAR | Status: DC
  Administered 2021-11-02: 40 mg via SUBCUTANEOUS
  Filled 2021-11-02: qty 0.4

## 2021-11-02 MED ORDER — HYDRALAZINE HCL 20 MG/ML IJ SOLN: 5.0000 mg | INTRAMUSCULAR | Status: DC | PRN

## 2021-11-02 MED ORDER — FENTANYL CITRATE (PF) 100 MCG/2ML IJ SOLN
INTRAMUSCULAR | Status: AC
  Administered 2021-11-02: 25 ug
  Filled 2021-11-02: qty 2

## 2021-11-02 MED ORDER — ONDANSETRON HCL 4 MG/2ML IJ SOLN
4.0000 mg | Freq: Once | INTRAMUSCULAR | Status: AC
  Administered 2021-11-02: 4 mg via INTRAVENOUS
  Filled 2021-11-02: qty 2

## 2021-11-02 MED ORDER — SENNA 8.6 MG PO TABS
1.0000 | ORAL_TABLET | Freq: Two times a day (BID) | ORAL | Status: DC
  Administered 2021-11-02 – 2021-11-06 (×6): 8.6 mg via ORAL
  Filled 2021-11-02 (×5): qty 1

## 2021-11-02 MED ORDER — BUPIVACAINE-EPINEPHRINE (PF) 0.5% -1:200000 IJ SOLN
INTRAMUSCULAR | Status: DC | PRN
  Administered 2021-11-02: 25 mL via PERINEURAL

## 2021-11-02 MED ORDER — FENTANYL CITRATE PF 50 MCG/ML IJ SOSY: 25.0000 ug | PREFILLED_SYRINGE | Freq: Once | INTRAMUSCULAR | Status: DC

## 2021-11-02 MED ORDER — FENTANYL CITRATE PF 50 MCG/ML IJ SOSY
50.0000 ug | PREFILLED_SYRINGE | Freq: Once | INTRAMUSCULAR | Status: AC
  Administered 2021-11-02: 50 ug via INTRAVENOUS
  Filled 2021-11-02: qty 1

## 2021-11-02 MED ORDER — FENTANYL CITRATE PF 50 MCG/ML IJ SOSY: 25.0000 ug | PREFILLED_SYRINGE | INTRAMUSCULAR | Status: DC | PRN

## 2021-11-02 MED ORDER — LIDOCAINE-EPINEPHRINE (PF) 1.5 %-1:200000 IJ SOLN
INTRAMUSCULAR | Status: DC | PRN
  Administered 2021-11-02: 5 mL via PERINEURAL

## 2021-11-02 NOTE — H&P (Signed)
History and Physical    Patient: Whitney Henderson FTD:[id number] DOB: [birth year] DOA: 11/02/2021 DOS: the patient was seen and examined on 11/02/2021 PCP: [name], MD  Patient coming from: Home  Chief Complaint:  Chief Complaint  Patient presents with   Fall   HPI: Whitney Henderson is a 88 y.o. female with medical history significant of hypertension, chronic kidney disease stage IIIb, and memory issues presents after fall.  At baseline patient walks around with use of a cane.  She had been coming back inside from outside and was walking into the laundry room when her feet get tingly and then she fell down and then onto her left hip.  She was able to get up with the assistance of her daughter and, but was unable to bear weight on her left leg.  Upon admission into the emergency department patient was seen to be mildly tachypneic with blood pressures 147/62 to 179/70, and all other vital signs maintained.  Labs noted hemoglobin 11.5, platelets 146, and creatinine 1.23.  X-rays revealed displaced fracture of the neck of the left femur.   Review of Systems: As mentioned in the history of present illness. All other systems reviewed and are negative. Past Medical History:  Diagnosis Date   Anxiety    Cataracts, bilateral    Discoid lupus    Headache 04/27/2015   Left>right periorbital   Hypertension    Memory difficulties 09/04/2016   Past Surgical History:  Procedure Laterality Date   ABDOMINAL HYSTERECTOMY     BREAST EXCISIONAL BIOPSY Left over 46 ys ago   benign   CHOLECYSTECTOMY     EXCISIONAL HEMORRHOIDECTOMY     Social History:  reports that she has quit smoking. Her smoking use included cigarettes. She has never used smokeless tobacco. She reports that she does not drink alcohol and does not use drugs.  Allergies  Allergen Reactions   Codeine Rash    Family History  Problem Relation Age of Onset   Early death Mother    Cancer Father    Breast cancer Daughter         29s    Prior to Admission medications   Medication Sig Start Date End Date Taking? Authorizing Provider  acetaminophen (TYLENOL) 500 MG tablet Take 500 mg by mouth every 6 (six) hours as needed for mild pain or moderate pain.    [provider]  amLODipine (NORVASC) 2.5 MG tablet TAKE 1 TABLET(2.5 MG) BY MOUTH TWICE DAILY 09/17/21   [name], MD  Apoaequorin (PREVAGEN PO) Take 1 capsule by mouth every other day.    [provider]  betamethasone dipropionate (DIPROLENE) 0.05 % cream APPLY THIN LAYER EXTERNALLY TO THE AFFECTED AREA EVERY DAY AS NEEDED Patient taking differently: 1 application  daily as needed (skin irritation). 05/25/18   Minette Brine, FNP  Calcium Carb-Cholecalciferol (CALCIUM 500 +D PO) Take 1 tablet by mouth every other day.    [provider]  Multiple Vitamins-Minerals (CENTRUM SILVER PO) Take 1 tablet by mouth every other day.    [provider]  Propylene Glycol (SYSTANE BALANCE OP) Place 1 drop into both eyes daily as needed (for itchy eyes).    [provider]  sertraline (ZOLOFT) 25 MG tablet TAKE 1 TABLET(25 MG) BY MOUTH DAILY 09/17/21   [name], MD  thiamine 100 MG tablet Take 100 mg by mouth daily.    [provider]  triamcinolone cream (KENALOG) 0.1 % APPLY TO AFFECTED AREA TWICE DAILY AS  NEEDED 04/16/21   [name], MD  vitamin C (ASCORBIC ACID) 500 MG tablet Take 500 mg by mouth every other day.    [provider]    Physical Exam: Vitals:   11/02/21 1401 11/02/21 1530 11/02/21 1600 11/02/21 1615  BP:  (!) 179/70 (!) 162/59 (!) 165/63  Pulse:  68 62 61  Resp:  20 (!) 25 18  Temp: 98.1 F (36.7 C)     TempSrc: Oral     SpO2:  100% 97% 98%  Weight:      Height:        Constitutional: Elderly female who appears to be in some discomfort Eyes: PERRL, lids and conjunctivae normal ENMT: Mucous membranes are moist. Posterior pharynx clear of any exudate or lesions.  Neck: normal,  supple Respiratory: clear to auscultation bilaterally, no wheezing, no crackles. Normal respiratory effort. No accessory muscle use.  Cardiovascular: Regular rate and rhythm, no murmurs / rubs / gallops.   Abdomen: no tenderness, no masses palpated. No hepatosplenomegaly. Bowel sounds positive.  Musculoskeletal: no clubbing / cyanosis.  Tender to palpation of the left hip. Skin: no rashes, lesions, ulcers. No induration Neurologic: CN 2-12 grossly intact.  Strength 5/5 in all 4.  Psychiatric: Mild memory impairment.  Alert and oriented x 3. Normal mood.   Data Reviewed:  EKG reveals sinus rhythm at 62 bpm with prolonged QT interval and left bundle branch block.  Assessment and Plan: Left femoral neck fracture secondary to fall Patient presents after having a fall at home and was unable to bear weight on her left leg.  X-rays revealed a acute femoral neck fracture.  Orthopedics consulted and planning  -Admit to a surgical telemetry bed -Fracture order set utilized -Nerve block per anesthesia -N.p.o. after midnight -Pain control -Orthopedics consulted, we will follow-up for any further recommendations  Essential hypertension Blood pressures noted to be elevated up to 181/79. -Resume home blood pressure medications once med rec verified -Hydralazine IV as needed  Normocytic anemia Hemoglobin 11.5 g/dL which appears around patient's baseline. -Check CBC tomorrow morning  Chronic kidney disease stage IIIb Creatinine stable at 1.23 which appears around patient's baseline.  Mild memory impairment -Delirium precautions  Advance Care Planning:   Code Status: Full Code    Consults: Orthopedics  Family Communication: Daughter updated over the phone  Severity of Illness: The appropriate patient status for this patient is INPATIENT. Inpatient status is judged to be reasonable and necessary in order to provide the required intensity of service to ensure the patient's safety. The  patient's presenting symptoms, physical exam findings, and initial radiographic and laboratory data in the context of their chronic comorbidities is felt to place them at high risk for further clinical deterioration. Furthermore, it is not anticipated that the patient will be medically stable for discharge from the hospital within 2 midnights of admission.   * I certify that at the point of admission it is my clinical judgment that the patient will require inpatient hospital care spanning beyond 2 midnights from the point of admission due to high intensity of service, high risk for further deterioration and high frequency of surveillance required.*  Author: Norval Morton, MD 11/02/2021 4:42 PM  For on call review [email or web address].

## 2021-11-02 NOTE — ED Notes (Signed)
Patient to IR

## 2021-11-02 NOTE — ED Provider Notes (Signed)
[facility] EMERGENCY DEPARTMENT Provider Note   CSN: [id number] Arrival date & time: 11/02/21  1345     History  Chief Complaint  Patient presents with   Whitney Henderson is a 88 y.o. female.  Patient brought in by EMS complaining of left thigh pain secondary to a fall.  Patient states she was walking from her washer and dryer back to her room when she got her feet tangled and fell down landing on the left thigh/hip.  EMS states she was unable to bear any weight on scene.  The patient denies hitting her head, denies loss of consciousness.  Patient does not take blood thinners.  Patient's past medical history significant for hypertension, anxiety, chronic renal disease stage III, osteoporosis  HPI     Home Medications Prior to Admission medications   Medication Sig Start Date End Date Taking? Authorizing Provider  acetaminophen (TYLENOL) 500 MG tablet Take 500 mg by mouth every 6 (six) hours as needed for mild pain or moderate pain.    [provider]  amLODipine (NORVASC) 2.5 MG tablet TAKE 1 TABLET(2.5 MG) BY MOUTH TWICE DAILY 09/17/21   [name], MD  Apoaequorin (PREVAGEN PO) Take 1 capsule by mouth every other day.    [provider]  betamethasone dipropionate (DIPROLENE) 0.05 % cream APPLY THIN LAYER EXTERNALLY TO THE AFFECTED AREA EVERY DAY AS NEEDED Patient taking differently: 1 application  daily as needed (skin irritation). 05/25/18   Minette Brine, FNP  Calcium Carb-Cholecalciferol (CALCIUM 500 +D PO) Take 1 tablet by mouth every other day.    [provider]  Multiple Vitamins-Minerals (CENTRUM SILVER PO) Take 1 tablet by mouth every other day.    [provider]  Propylene Glycol (SYSTANE BALANCE OP) Place 1 drop into both eyes daily as needed (for itchy eyes).    [provider]  sertraline (ZOLOFT) 25 MG tablet TAKE 1 TABLET(25 MG) BY MOUTH DAILY 09/17/21   [name], MD  thiamine 100 MG  tablet Take 100 mg by mouth daily.    [provider]  triamcinolone cream (KENALOG) 0.1 % APPLY TO AFFECTED AREA TWICE DAILY AS NEEDED 04/16/21   [name], MD  vitamin C (ASCORBIC ACID) 500 MG tablet Take 500 mg by mouth every other day.    [provider]      Allergies    Codeine    Review of Systems   Review of Systems  Gastrointestinal:  Negative for nausea.  Musculoskeletal:  Positive for arthralgias. Negative for back pain and neck pain.  Neurological:  Negative for syncope and headaches.    Physical Exam Updated Vital Signs BP (!) 147/62   Pulse 62   Temp 98.1 F (36.7 C) (Oral)   Resp 18   Ht '5\' 2"'$  (1.575 m)   Wt 54.4 kg   SpO2 98%   BMI 21.95 kg/m  Physical Exam Vitals and nursing note reviewed.  Constitutional:      General: She is not in acute distress.    Appearance: She is well-developed.  HENT:     Head: Normocephalic and atraumatic.     Mouth/Throat:     Mouth: Mucous membranes are moist.  Eyes:     Conjunctiva/sclera: Conjunctivae normal.  Cardiovascular:     Rate and Rhythm: Normal rate and regular rhythm.     Heart sounds: No murmur heard. Pulmonary:     Effort: Pulmonary effort is normal. No respiratory distress.  Breath sounds: Normal breath sounds.  Abdominal:     Palpations: Abdomen is soft.     Tenderness: There is no abdominal tenderness.  Musculoskeletal:        General: Tenderness (Tenderness to palpation of the left lower extremity from the knee up to the hip) present. No swelling or deformity.     Cervical back: Normal range of motion and neck supple. No tenderness.     Comments: No obvious shortening or rotation to the left lower extremity, no noted deformity.  Patient unwilling to move leg at this time due to pain.  Patient with palpable pedal pulse in the left foot.  Sensation equal bilaterally  Skin:    General: Skin is warm and dry.     Capillary Refill: Capillary refill takes less than 2 seconds.   Neurological:     General: No focal deficit present.     Mental Status: She is alert.  Psychiatric:        Mood and Affect: Mood normal.     ED Results / Procedures / Treatments   Labs (all labs ordered are listed, but only abnormal results are displayed) Labs Reviewed  BASIC METABOLIC PANEL - Abnormal; Notable for the following components:      Result Value   CO2 21 (*)    Glucose, Bld 109 (*)    Creatinine, Ser 1.23 (*)    GFR, Estimated 41 (*)    All other components within normal limits  CBC WITH DIFFERENTIAL/PLATELET - Abnormal; Notable for the following components:   Hemoglobin 11.5 (*)    HCT 34.0 (*)    Platelets 146 (*)    All other components within normal limits  URINALYSIS, ROUTINE W REFLEX MICROSCOPIC    EKG None  Radiology CT Head Wo Contrast  Result Date: 11/02/2021 CLINICAL DATA:  Fall EXAM: CT HEAD WITHOUT CONTRAST CT CERVICAL SPINE WITHOUT CONTRAST TECHNIQUE: Multidetector CT imaging of the head and cervical spine was performed following the standard protocol without intravenous contrast. Multiplanar CT image reconstructions of the cervical spine were also generated. RADIATION DOSE REDUCTION: This exam was performed according to the departmental dose-optimization program which includes automated exposure control, adjustment of the mA and/or kV according to patient size and/or use of iterative reconstruction technique. COMPARISON:  CT head and cervical spine 12/20/2020 FINDINGS: CT HEAD FINDINGS Brain: There is no acute intracranial hemorrhage, extra-axial fluid collection, or acute infarct. There is unchanged mild parenchymal volume loss with prominence of the ventricular system and extra-axial CSF spaces. The ventricles are stable in size gray-white differentiation is preserved. There is no mass lesion.  There is no mass effect or midline shift. Vascular: There is calcification of the bilateral carotid siphons. Skull: Normal. Negative for fracture or focal  lesion. Sinuses/Orbits: The paranasal sinuses are clear. Bilateral lens implants are in place. The globes and orbits are otherwise unremarkable. Other: None. CT CERVICAL SPINE FINDINGS Alignment: Normal. There is no jumped or perched facet or other evidence of traumatic malalignment. Skull base and vertebrae: Skull base alignment is maintained. Vertebral body heights are preserved. There is no evidence of acute fracture. There is no suspicious osseous lesion. Soft tissues and spinal canal: No prevertebral fluid or swelling. No visible canal hematoma. Disc levels: There is overall mild for age degenerative change of the cervical spine, with disc space narrowing degenerative endplate change most advanced at C5-C6. Upper chest: Imaged lung apices are clear. Other: On the scout radiograph, there is fullness of the mediastinum and AP window  was not present on prior chest radiographs. IMPRESSION: 1. No acute intracranial pathology. 2. No acute fracture or traumatic malalignment of the cervical spine. 3. Fullness of the mediastinum and AP window on the scout radiograph not present on prior radiographs. Recommend dedicated PA and lateral chest radiographs for better evaluation. Electronically Signed   By: Valetta Mole M.D.   On: 11/02/2021 15:34   CT Cervical Spine Wo Contrast  Result Date: 11/02/2021 CLINICAL DATA:  Fall EXAM: CT HEAD WITHOUT CONTRAST CT CERVICAL SPINE WITHOUT CONTRAST TECHNIQUE: Multidetector CT imaging of the head and cervical spine was performed following the standard protocol without intravenous contrast. Multiplanar CT image reconstructions of the cervical spine were also generated. RADIATION DOSE REDUCTION: This exam was performed according to the departmental dose-optimization program which includes automated exposure control, adjustment of the mA and/or kV according to patient size and/or use of iterative reconstruction technique. COMPARISON:  CT head and cervical spine 12/20/2020 FINDINGS: CT  HEAD FINDINGS Brain: There is no acute intracranial hemorrhage, extra-axial fluid collection, or acute infarct. There is unchanged mild parenchymal volume loss with prominence of the ventricular system and extra-axial CSF spaces. The ventricles are stable in size gray-white differentiation is preserved. There is no mass lesion.  There is no mass effect or midline shift. Vascular: There is calcification of the bilateral carotid siphons. Skull: Normal. Negative for fracture or focal lesion. Sinuses/Orbits: The paranasal sinuses are clear. Bilateral lens implants are in place. The globes and orbits are otherwise unremarkable. Other: None. CT CERVICAL SPINE FINDINGS Alignment: Normal. There is no jumped or perched facet or other evidence of traumatic malalignment. Skull base and vertebrae: Skull base alignment is maintained. Vertebral body heights are preserved. There is no evidence of acute fracture. There is no suspicious osseous lesion. Soft tissues and spinal canal: No prevertebral fluid or swelling. No visible canal hematoma. Disc levels: There is overall mild for age degenerative change of the cervical spine, with disc space narrowing degenerative endplate change most advanced at C5-C6. Upper chest: Imaged lung apices are clear. Other: On the scout radiograph, there is fullness of the mediastinum and AP window was not present on prior chest radiographs. IMPRESSION: 1. No acute intracranial pathology. 2. No acute fracture or traumatic malalignment of the cervical spine. 3. Fullness of the mediastinum and AP window on the scout radiograph not present on prior radiographs. Recommend dedicated PA and lateral chest radiographs for better evaluation. Electronically Signed   By: Valetta Mole M.D.   On: 11/02/2021 15:34   DG Knee Complete 4 Views Left  Result Date: 11/02/2021 CLINICAL DATA:  Trauma, fall EXAM: LEFT KNEE - COMPLETE 4+ VIEW COMPARISON:  None Available. FINDINGS: No evidence of fracture, dislocation, or  joint effusion. No evidence of arthropathy or other focal bone abnormality. Soft tissues are unremarkable. IMPRESSION: No fracture or dislocation is seen in left knee. Electronically Signed   By: Elmer Picker M.D.   On: 11/02/2021 14:47   DG Hip Unilat With Pelvis 2-3 Views Left  Result Date: 11/02/2021 CLINICAL DATA:  Trauma, fall EXAM: DG HIP (WITH OR WITHOUT PELVIS) 2-3V LEFT COMPARISON:  None Available. FINDINGS: Fracture is seen in the neck of left femur. There is superior and lateral displacement of distal major fracture fragment. There is no dislocation. There is anterior angulation at the fracture site. IMPRESSION: Displaced fracture is seen in the neck of left femur. Electronically Signed   By: Elmer Picker M.D.   On: 11/02/2021 14:46    Procedures Procedures  Medications Ordered in ED Medications  fentaNYL (SUBLIMAZE) injection 50 mcg (50 mcg Intravenous Given 11/02/21 1641)  ondansetron (ZOFRAN) injection 4 mg (4 mg Intravenous Given 11/02/21 1531)  fentaNYL (SUBLIMAZE) injection 50 mcg (50 mcg Intravenous Given 11/02/21 1529)    ED Course/ Medical Decision Making/ A&P                           Medical Decision Making Amount and/or Complexity of Data Reviewed Independent Historian: EMS External Data Reviewed: notes. Labs: ordered. Radiology: ordered and independent interpretation performed. ECG/medicine tests: ordered and independent interpretation performed.  Risk Prescription drug management. Decision regarding hospitalization.   This patient presents to the ED for concern of left thigh pain from a fall, this involves an extensive number of treatment options, and is a complaint that carries with it a high risk of complications and morbidity.  The differential diagnosis includes hip fracture, femur fracture, dislocation, soft tissue injury, and others.  Also concern for intracranial or cervical spine injury based on patient's age and osteoporosis.   Co  morbidities that complicate the patient evaluation  History of hypertension, osteoporosis   Additional history obtained:  Additional history obtained from EMS External records from outside source obtained and reviewed including primary care notes showing active problems including osteoporosis, tremor, hypertension, hypertensive neuropathy, CKD stage III, anxiety, memory disorder   Lab Tests:  I Ordered, and personally interpreted labs.  The pertinent results include: Hemoglobin 11.5, creatinine 1.23 (consistent with baseline)   Imaging Studies ordered:  I ordered imaging studies including plain films of the left hip and knee and CT of the head and cervical spine I independently visualized and interpreted imaging which showed displaced left femoral neck fracture, negative left knee x-ray.  CT shows I agree with the radiologist interpretation   Cardiac Monitoring: / EKG:  The patient was maintained on a cardiac monitor.  I personally viewed and interpreted the cardiac monitored which showed an underlying rhythm of: Sinus rhythm   Consultations Obtained:  I requested consultation with the orthopedist on-call, Hilbert Odor,  and discussed lab and imaging findings as well as pertinent plan - they recommend: Medicine admit with plans for probable surgical fixation  I requested consultation with the medicine service.  Dr. Tamala Julian agrees to see the patient for medical admission.   Problem List / ED Course / Critical interventions / Medication management   I ordered medication including fentanyl for pain and Zofran for nausea Reevaluation of the patient after these medicines showed that the patient improved I have reviewed the patients home medicines and have made adjustments as needed   Social Determinants of Health:  Patient lives at home alone   Test / Admission - Considered:  Patient has a left-sided hip fracture and will need admission for further management and  probable surgical fixation.  Admit to medicine service.        Final Clinical Impression(s) / ED Diagnoses Final diagnoses:  Closed fracture of neck of left femur, initial encounter [facility])  Fall, initial encounter    Rx / DC Orders ED Discharge Orders     None         Ronny Bacon 11/02/21 [place], [place], DO 11/05/21 [phone]

## 2021-11-02 NOTE — ED Triage Notes (Signed)
Pt BIB [facility] to [facility] ED. EMS reported patient live alone and was coming out her resident, when she turn she fell on her left leg. Pt was half hour on the ground until garbage man helped her. It  was reported she dd not hit her head and she is not on a blood thinner. Whenever she move left leg she experience pain.

## 2021-11-02 NOTE — Anesthesia Preprocedure Evaluation (Addendum)
Anesthesia Evaluation  Patient identified by MRN, date of birth, ID band Patient awake    Reviewed: Allergy & Precautions, Patient's Chart, lab work & pertinent test results  History of Anesthesia Complications Negative for: history of anesthetic complications  Airway Mallampati: III  TM Distance: >3 FB Neck ROM: Full    Dental  (+) Teeth Intact, Dental Advisory Given   Pulmonary neg shortness of breath, neg sleep apnea, neg COPD, neg recent URI, former smoker,    breath sounds clear to auscultation       Cardiovascular hypertension, Pt. on medications (-) angina(-) Past MI  Rhythm:Regular     Neuro/Psych  Headaches, neg Seizures PSYCHIATRIC DISORDERS Anxiety    GI/Hepatic negative GI ROS, Neg liver ROS,   Endo/Other  negative endocrine ROS  Renal/GU Renal InsufficiencyRenal diseaseLab Results      Component                Value               Date                      CREATININE               1.23 (H)            11/02/2021                Musculoskeletal Displaced fracture is seen in the neck of left femur.   Electronically Signed   By: Elmer Picker M.D.   On: 11/02/2021 14:46    Abdominal   Peds  Hematology  (+) Blood dyscrasia, anemia , Lab Results      Component                Value               Date                      WBC                      6.2                 11/02/2021                HGB                      11.5 (L)            11/02/2021                HCT                      34.0 (L)            11/02/2021                MCV                      87.0                11/02/2021                PLT                      146 (L)             11/02/2021  Anesthesia Other Findings   Reproductive/Obstetrics                            Anesthesia Physical Anesthesia Plan  ASA: 3  Anesthesia Plan: Regional   Post-op Pain Management: Regional block*    Induction:   PONV Risk Score and Plan: 2 and Treatment may vary due to age or medical condition  Airway Management Planned: Natural Airway and Nasal Cannula  Additional Equipment: None  Intra-op Plan:   Post-operative Plan:   Informed Consent: I have reviewed the patients History and Physical, chart, labs and discussed the procedure including the risks, benefits and alternatives for the proposed anesthesia with the patient or authorized representative who has indicated his/her understanding and acceptance.     Consent reviewed with POA  Plan Discussed with:   Anesthesia Plan Comments:         Anesthesia Quick Evaluation

## 2021-11-02 NOTE — Anesthesia Procedure Notes (Signed)
Anesthesia Regional Block: Peng block   Pre-Anesthetic Checklist: , timeout performed,  Correct Patient, Correct Site, Correct Laterality,  Correct Procedure, Correct Position, site marked,  Risks and benefits discussed,  Surgical consent,  Pre-op evaluation,  At surgeon's request and post-op pain management  Laterality: Left and Lower  Prep: chloraprep       Needles:  Injection technique: Single-shot      Needle Length: 9cm  Needle Gauge: 22     Additional Needles: Arrow StimuQuik ECHO Echogenic Stimulating PNB Needle  Procedures:,,,, ultrasound used (permanent image in chart),,    Narrative:  Start time: 11/02/2021 7:22 PM End time: 11/02/2021 7:29 PM Injection made incrementally with aspirations every 5 mL.  Performed by: Personally  Anesthesiologist: Oleta Mouse, MD

## 2021-11-02 NOTE — Consult Note (Cosign Needed)
Reason for Consult:Left hip fx Referring Physician: Leanord Henderson Time called: 1502 Time at bedside: [name] is an 88 y.o. female.  HPI: Whitney Henderson tripped and fell going from one room to the next earlier today. She had immediate left hip pain and could not get up. She was brought to the ED where x-rays showed a left hip fx and orthopedic surgery was consulted. She lives alone and ambulates with a cane.  Past Medical History:  Diagnosis Date   Anxiety    Cataracts, bilateral    Discoid lupus    Headache 04/27/2015   Left>right periorbital   Hypertension    Memory difficulties 09/04/2016    Past Surgical History:  Procedure Laterality Date   ABDOMINAL HYSTERECTOMY     BREAST EXCISIONAL BIOPSY Left over 33 ys ago   benign   CHOLECYSTECTOMY     EXCISIONAL HEMORRHOIDECTOMY      Family History  Problem Relation Age of Onset   Early death Mother    Cancer Father    Breast cancer Daughter        69s    Social History:  reports that she has quit smoking. Her smoking use included cigarettes. She has never used smokeless tobacco. She reports that she does not drink alcohol and does not use drugs.  Allergies:  Allergies  Allergen Reactions   Codeine Rash    Medications: I have reviewed the patient's current medications.  No results found for this or any previous visit (from the past 48 hour(s)).  DG Knee Complete 4 Views Left  Result Date: 11/02/2021 CLINICAL DATA:  Trauma, fall EXAM: LEFT KNEE - COMPLETE 4+ VIEW COMPARISON:  None Available. FINDINGS: No evidence of fracture, dislocation, or joint effusion. No evidence of arthropathy or other focal bone abnormality. Soft tissues are unremarkable. IMPRESSION: No fracture or dislocation is seen in left knee. Electronically Signed   By: Elmer Picker M.D.   On: 11/02/2021 14:47   DG Hip Unilat With Pelvis 2-3 Views Left  Result Date: 11/02/2021 CLINICAL DATA:  Trauma, fall EXAM: DG HIP (WITH OR WITHOUT  PELVIS) 2-3V LEFT COMPARISON:  None Available. FINDINGS: Fracture is seen in the neck of left femur. There is superior and lateral displacement of distal major fracture fragment. There is no dislocation. There is anterior angulation at the fracture site. IMPRESSION: Displaced fracture is seen in the neck of left femur. Electronically Signed   By: Elmer Picker M.D.   On: 11/02/2021 14:46    Review of Systems  HENT:  Negative for ear discharge, ear pain, hearing loss and tinnitus.   Eyes:  Negative for photophobia and pain.  Respiratory:  Negative for cough and shortness of breath.   Cardiovascular:  Negative for chest pain.  Gastrointestinal:  Negative for abdominal pain, nausea and vomiting.  Genitourinary:  Negative for dysuria, flank pain, frequency and urgency.  Musculoskeletal:  Positive for arthralgias (Left hip). Negative for back pain, myalgias and neck pain.  Neurological:  Negative for dizziness and headaches.  Hematological:  Does not bruise/bleed easily.  Psychiatric/Behavioral:  The patient is not nervous/anxious.    Temperature 98.1 F (36.7 C), temperature source Oral, height '5\' 2"'$  (1.575 m), weight 54.4 kg. Physical Exam Constitutional:      General: She is not in acute distress.    Appearance: She is well-developed. She is not diaphoretic.  HENT:     Head: Normocephalic and atraumatic.  Eyes:     General: No scleral icterus.  Right eye: No discharge.        Left eye: No discharge.     Conjunctiva/sclera: Conjunctivae normal.  Cardiovascular:     Rate and Rhythm: Normal rate and regular rhythm.  Pulmonary:     Effort: Pulmonary effort is normal. No respiratory distress.  Musculoskeletal:     Cervical back: Normal range of motion.     Comments: LLE No traumatic wounds, ecchymosis, or rash  Mod TTP hip  No knee or ankle effusion  Knee stable to varus/ valgus and anterior/posterior stress  Sens DPN, SPN, TN intact  Motor EHL, ext, flex, evers 5/5  DP 1+,  PT 0, No significant edema  Skin:    General: Skin is warm and dry.  Neurological:     Mental Status: She is alert.  Psychiatric:        Mood and Affect: Mood normal.        Behavior: Behavior normal.     Assessment/Plan: Left hip fx -- Will need hip hemi by Dr. Lyla Glassing. Please keep NPO for now though I suspect surgery will be tomorrow.    Lisette Abu, PA-C Orthopedic Surgery [phone] 11/02/2021, 3:27 PM

## 2021-11-03 ENCOUNTER — Inpatient Hospital Stay (HOSPITAL_COMMUNITY): Payer: Medicare Other | Admitting: Anesthesiology

## 2021-11-03 ENCOUNTER — Encounter (HOSPITAL_COMMUNITY): Payer: Self-pay | Admitting: Internal Medicine

## 2021-11-03 ENCOUNTER — Encounter (HOSPITAL_COMMUNITY)
Admission: EM | Disposition: A | Discharge status: Skilled Nursing Facility | Payer: Self-pay | Source: Home / Self Care | Attending: Orthopaedic Surgery

## 2021-11-03 ENCOUNTER — Other Ambulatory Visit: Payer: Self-pay

## 2021-11-03 ENCOUNTER — Inpatient Hospital Stay (HOSPITAL_COMMUNITY): Payer: Medicare Other

## 2021-11-03 DIAGNOSIS — I10 Essential (primary) hypertension: Secondary | ICD-10-CM | POA: Diagnosis not present

## 2021-11-03 DIAGNOSIS — S72002A Fracture of unspecified part of neck of left femur, initial encounter for closed fracture: Secondary | ICD-10-CM

## 2021-11-03 DIAGNOSIS — S72009A Fracture of unspecified part of neck of unspecified femur, initial encounter for closed fracture: Secondary | ICD-10-CM | POA: Diagnosis present

## 2021-11-03 DIAGNOSIS — D631 Anemia in chronic kidney disease: Secondary | ICD-10-CM

## 2021-11-03 DIAGNOSIS — N1832 Chronic kidney disease, stage 3b: Secondary | ICD-10-CM | POA: Diagnosis not present

## 2021-11-03 DIAGNOSIS — Z87891 Personal history of nicotine dependence: Secondary | ICD-10-CM

## 2021-11-03 DIAGNOSIS — W19XXXA Unspecified fall, initial encounter: Secondary | ICD-10-CM | POA: Diagnosis not present

## 2021-11-03 DIAGNOSIS — N189 Chronic kidney disease, unspecified: Secondary | ICD-10-CM

## 2021-11-03 DIAGNOSIS — I129 Hypertensive chronic kidney disease with stage 1 through stage 4 chronic kidney disease, or unspecified chronic kidney disease: Secondary | ICD-10-CM

## 2021-11-03 HISTORY — PX: TOTAL HIP ARTHROPLASTY: SHX124

## 2021-11-03 LAB — CBC
HCT: 30.3 % — ABNORMAL LOW (ref 36.0–46.0)
Hemoglobin: 10.1 g/dL — ABNORMAL LOW (ref 12.0–15.0)
MCH: 29.3 pg (ref 26.0–34.0)
MCHC: 33.3 g/dL (ref 30.0–36.0)
MCV: 87.8 fL (ref 80.0–100.0)
Platelets: 131 10*3/uL — ABNORMAL LOW (ref 150–400)
RBC: 3.45 MIL/uL — ABNORMAL LOW (ref 3.87–5.11)
RDW: 14.2 % (ref 11.5–15.5)
WBC: 5 10*3/uL (ref 4.0–10.5)
nRBC: 0 % (ref 0.0–0.2)

## 2021-11-03 LAB — BASIC METABOLIC PANEL
Anion gap: 9 (ref 5–15)
BUN: 25 mg/dL — ABNORMAL HIGH (ref 8–23)
CO2: 24 mmol/L (ref 22–32)
Calcium: 8.9 mg/dL (ref 8.9–10.3)
Chloride: 104 mmol/L (ref 98–111)
Creatinine, Ser: 1.31 mg/dL — ABNORMAL HIGH (ref 0.44–1.00)
GFR, Estimated: 38 mL/min — ABNORMAL LOW (ref >60)
Glucose, Bld: 103 mg/dL — ABNORMAL HIGH (ref 70–99)
Potassium: 3.7 mmol/L (ref 3.5–5.1)
Sodium: 137 mmol/L (ref 135–145)

## 2021-11-03 LAB — SURGICAL PCR SCREEN
MRSA, PCR: NEGATIVE
Staphylococcus aureus: NEGATIVE

## 2021-11-03 SURGERY — ARTHROPLASTY, HIP, TOTAL, ANTERIOR APPROACH
Anesthesia: General | Site: Hip | Laterality: Left

## 2021-11-03 MED ORDER — TRAMADOL HCL 50 MG PO TABS: 50.0000 mg | ORAL_TABLET | Freq: Two times a day (BID) | ORAL | Status: DC | PRN

## 2021-11-03 MED ORDER — METHOCARBAMOL 500 MG PO TABS
500.0000 mg | ORAL_TABLET | Freq: Four times a day (QID) | ORAL | Status: DC | PRN
  Administered 2021-11-04 – 2021-11-05 (×2): 500 mg via ORAL
  Filled 2021-11-03 (×2): qty 1

## 2021-11-03 MED ORDER — TRANEXAMIC ACID-NACL 1000-0.7 MG/100ML-% IV SOLN
INTRAVENOUS | Status: AC
  Filled 2021-11-03: qty 100

## 2021-11-03 MED ORDER — CHLORHEXIDINE GLUCONATE 0.12 % MT SOLN: 15.0000 mL | Freq: Once | OROMUCOSAL | Status: AC

## 2021-11-03 MED ORDER — PROPOFOL 10 MG/ML IV BOLUS
INTRAVENOUS | Status: AC
  Filled 2021-11-03: qty 20

## 2021-11-03 MED ORDER — CHLORHEXIDINE GLUCONATE 4 % EX LIQD: 60.0000 mL | Freq: Once | CUTANEOUS | Status: DC

## 2021-11-03 MED ORDER — DIPHENHYDRAMINE HCL 12.5 MG/5ML PO ELIX: 12.5000 mg | ORAL_SOLUTION | ORAL | Status: DC | PRN

## 2021-11-03 MED ORDER — POVIDONE-IODINE 10 % EX SWAB: 2.0000 | Freq: Once | CUTANEOUS | Status: DC

## 2021-11-03 MED ORDER — MUPIROCIN 2 % EX OINT: 1.0000 | TOPICAL_OINTMENT | Freq: Once | CUTANEOUS | Status: AC

## 2021-11-03 MED ORDER — PHENOL 1.4 % MT LIQD: 1.0000 | OROMUCOSAL | Status: DC | PRN

## 2021-11-03 MED ORDER — PHENYLEPHRINE HCL-NACL 20-0.9 MG/250ML-% IV SOLN
INTRAVENOUS | Status: DC | PRN
  Administered 2021-11-03: 50 ug/min via INTRAVENOUS

## 2021-11-03 MED ORDER — SUGAMMADEX SODIUM 200 MG/2ML IV SOLN
INTRAVENOUS | Status: DC | PRN
  Administered 2021-11-03: 200 mg via INTRAVENOUS

## 2021-11-03 MED ORDER — FENTANYL CITRATE (PF) 250 MCG/5ML IJ SOLN
INTRAMUSCULAR | Status: AC
  Filled 2021-11-03: qty 5

## 2021-11-03 MED ORDER — ENOXAPARIN SODIUM 30 MG/0.3ML IJ SOSY
30.0000 mg | PREFILLED_SYRINGE | INTRAMUSCULAR | Status: DC
  Administered 2021-11-03 – 2021-11-05 (×3): 30 mg via SUBCUTANEOUS
  Filled 2021-11-03 (×3): qty 0.3

## 2021-11-03 MED ORDER — PROPOFOL 10 MG/ML IV BOLUS
INTRAVENOUS | Status: DC | PRN
  Administered 2021-11-03: 50 mg via INTRAVENOUS

## 2021-11-03 MED ORDER — ONDANSETRON HCL 4 MG PO TABS: 4.0000 mg | ORAL_TABLET | Freq: Four times a day (QID) | ORAL | Status: DC | PRN

## 2021-11-03 MED ORDER — MUPIROCIN 2 % EX OINT
TOPICAL_OINTMENT | CUTANEOUS | Status: AC
  Administered 2021-11-03: 1 via TOPICAL
  Filled 2021-11-03: qty 22

## 2021-11-03 MED ORDER — CEFAZOLIN SODIUM-DEXTROSE 2-4 GM/100ML-% IV SOLN
INTRAVENOUS | Status: AC
  Filled 2021-11-03: qty 100

## 2021-11-03 MED ORDER — 0.9 % SODIUM CHLORIDE (POUR BTL) OPTIME
TOPICAL | Status: DC | PRN
  Administered 2021-11-03: 1000 mL

## 2021-11-03 MED ORDER — HYDROCODONE-ACETAMINOPHEN 7.5-325 MG PO TABS
1.0000 | ORAL_TABLET | ORAL | Status: DC | PRN
  Administered 2021-11-04: 2 via ORAL
  Filled 2021-11-03: qty 2

## 2021-11-03 MED ORDER — ACETAMINOPHEN 500 MG PO TABS
500.0000 mg | ORAL_TABLET | Freq: Four times a day (QID) | ORAL | Status: DC | PRN
  Administered 2021-11-03: 500 mg via ORAL
  Filled 2021-11-03: qty 1

## 2021-11-03 MED ORDER — ACETAMINOPHEN 325 MG PO TABS
325.0000 mg | ORAL_TABLET | Freq: Four times a day (QID) | ORAL | Status: DC | PRN
  Administered 2021-11-04 – 2021-11-05 (×4): 650 mg via ORAL
  Filled 2021-11-03 (×4): qty 2

## 2021-11-03 MED ORDER — METOCLOPRAMIDE HCL 5 MG/ML IJ SOLN: 5.0000 mg | Freq: Three times a day (TID) | INTRAMUSCULAR | Status: DC | PRN

## 2021-11-03 MED ORDER — SODIUM CHLORIDE 0.9 % IV SOLN: INTRAVENOUS | Status: DC

## 2021-11-03 MED ORDER — ACETAMINOPHEN 10 MG/ML IV SOLN
INTRAVENOUS | Status: AC
  Filled 2021-11-03: qty 100

## 2021-11-03 MED ORDER — SODIUM CHLORIDE 0.9 % IR SOLN
Status: DC | PRN
  Administered 2021-11-03: 1000 mL

## 2021-11-03 MED ORDER — ACETAMINOPHEN 10 MG/ML IV SOLN
INTRAVENOUS | Status: DC | PRN
  Administered 2021-11-03: 1000 mg via INTRAVENOUS

## 2021-11-03 MED ORDER — MENTHOL 3 MG MT LOZG: 1.0000 | LOZENGE | OROMUCOSAL | Status: DC | PRN

## 2021-11-03 MED ORDER — ONDANSETRON HCL 4 MG/2ML IJ SOLN
INTRAMUSCULAR | Status: DC | PRN
  Administered 2021-11-03: 4 mg via INTRAVENOUS

## 2021-11-03 MED ORDER — CEFAZOLIN SODIUM-DEXTROSE 1-4 GM/50ML-% IV SOLN
1.0000 g | Freq: Four times a day (QID) | INTRAVENOUS | Status: AC
  Administered 2021-11-03 – 2021-11-04 (×2): 1 g via INTRAVENOUS
  Filled 2021-11-03 (×2): qty 50

## 2021-11-03 MED ORDER — ORAL CARE MOUTH RINSE: 15.0000 mL | Freq: Once | OROMUCOSAL | Status: AC

## 2021-11-03 MED ORDER — HYDROCODONE-ACETAMINOPHEN 5-325 MG PO TABS
1.0000 | ORAL_TABLET | ORAL | Status: DC | PRN
  Filled 2021-11-03: qty 1

## 2021-11-03 MED ORDER — LACTATED RINGERS IV SOLN: INTRAVENOUS | Status: DC

## 2021-11-03 MED ORDER — LIDOCAINE 2% (20 MG/ML) 5 ML SYRINGE
INTRAMUSCULAR | Status: DC | PRN
  Administered 2021-11-03: 40 mg via INTRAVENOUS

## 2021-11-03 MED ORDER — METHOCARBAMOL 1000 MG/10ML IJ SOLN: 500.0000 mg | Freq: Four times a day (QID) | INTRAVENOUS | Status: DC | PRN

## 2021-11-03 MED ORDER — ASPIRIN 81 MG PO CHEW
81.0000 mg | CHEWABLE_TABLET | Freq: Two times a day (BID) | ORAL | Status: DC
  Administered 2021-11-03 – 2021-11-06 (×6): 81 mg via ORAL
  Filled 2021-11-03 (×6): qty 1

## 2021-11-03 MED ORDER — VITAMIN C 500 MG PO TABS
500.0000 mg | ORAL_TABLET | ORAL | Status: DC
  Administered 2021-11-03 – 2021-11-05 (×2): 500 mg via ORAL
  Filled 2021-11-03 (×4): qty 1

## 2021-11-03 MED ORDER — TRANEXAMIC ACID-NACL 1000-0.7 MG/100ML-% IV SOLN
1000.0000 mg | INTRAVENOUS | Status: AC
  Administered 2021-11-03: 1000 mg via INTRAVENOUS

## 2021-11-03 MED ORDER — FENTANYL CITRATE (PF) 100 MCG/2ML IJ SOLN: 25.0000 ug | INTRAMUSCULAR | Status: DC | PRN

## 2021-11-03 MED ORDER — CEFAZOLIN SODIUM-DEXTROSE 2-4 GM/100ML-% IV SOLN
2.0000 g | INTRAVENOUS | Status: AC
  Administered 2021-11-03: 2 g via INTRAVENOUS

## 2021-11-03 MED ORDER — MORPHINE SULFATE (PF) 2 MG/ML IV SOLN: 0.5000 mg | INTRAVENOUS | Status: DC | PRN

## 2021-11-03 MED ORDER — METOCLOPRAMIDE HCL 5 MG PO TABS: 5.0000 mg | ORAL_TABLET | Freq: Three times a day (TID) | ORAL | Status: DC | PRN

## 2021-11-03 MED ORDER — DOCUSATE SODIUM 100 MG PO CAPS
100.0000 mg | ORAL_CAPSULE | Freq: Two times a day (BID) | ORAL | Status: DC
  Administered 2021-11-03 – 2021-11-06 (×7): 100 mg via ORAL
  Filled 2021-11-03 (×7): qty 1

## 2021-11-03 MED ORDER — THIAMINE HCL 100 MG PO TABS
100.0000 mg | ORAL_TABLET | Freq: Every day | ORAL | Status: DC
  Administered 2021-11-03 – 2021-11-06 (×4): 100 mg via ORAL
  Filled 2021-11-03 (×7): qty 1

## 2021-11-03 MED ORDER — FENTANYL CITRATE (PF) 250 MCG/5ML IJ SOLN
INTRAMUSCULAR | Status: DC | PRN
  Administered 2021-11-03 (×2): 50 ug via INTRAVENOUS

## 2021-11-03 MED ORDER — CHLORHEXIDINE GLUCONATE 0.12 % MT SOLN
OROMUCOSAL | Status: AC
  Administered 2021-11-03: 15 mL via OROMUCOSAL
  Filled 2021-11-03: qty 15

## 2021-11-03 MED ORDER — AMLODIPINE BESYLATE 2.5 MG PO TABS
2.5000 mg | ORAL_TABLET | Freq: Every day | ORAL | Status: DC
  Administered 2021-11-03 – 2021-11-06 (×4): 2.5 mg via ORAL
  Filled 2021-11-03 (×4): qty 1

## 2021-11-03 MED ORDER — SERTRALINE HCL 25 MG PO TABS
25.0000 mg | ORAL_TABLET | Freq: Every day | ORAL | Status: DC
  Administered 2021-11-03 – 2021-11-05 (×3): 25 mg via ORAL
  Filled 2021-11-03 (×3): qty 1

## 2021-11-03 MED ORDER — ONDANSETRON HCL 4 MG/2ML IJ SOLN: 4.0000 mg | Freq: Four times a day (QID) | INTRAMUSCULAR | Status: DC | PRN

## 2021-11-03 MED ORDER — STERILE WATER FOR IRRIGATION IR SOLN
Status: DC | PRN
  Administered 2021-11-03: 1000 mL

## 2021-11-03 MED ORDER — PHENYLEPHRINE 80 MCG/ML (10ML) SYRINGE FOR IV PUSH (FOR BLOOD PRESSURE SUPPORT)
PREFILLED_SYRINGE | INTRAVENOUS | Status: DC | PRN
  Administered 2021-11-03 (×2): 80 ug via INTRAVENOUS

## 2021-11-03 MED ORDER — ROCURONIUM BROMIDE 10 MG/ML (PF) SYRINGE
PREFILLED_SYRINGE | INTRAVENOUS | Status: DC | PRN
  Administered 2021-11-03: 10 mg via INTRAVENOUS
  Administered 2021-11-03: 30 mg via INTRAVENOUS

## 2021-11-03 SURGICAL SUPPLY — 54 items
APL SKNCLS STERI-STRIP NONHPOA (GAUZE/BANDAGES/DRESSINGS)
BAG COUNTER SPONGE SURGICOUNT (BAG) ×1 IMPLANT
BAG SPNG CNTER NS LX DISP (BAG) ×1
BENZOIN TINCTURE PRP APPL 2/3 (GAUZE/BANDAGES/DRESSINGS) ×1 IMPLANT
BIPOLAR AML DEPUY 42 (Hips) ×1 IMPLANT
BLADE CLIPPER SURG (BLADE) IMPLANT
BLADE SAW SGTL 18X1.27X75 (BLADE) ×1 IMPLANT
COVER SURGICAL LIGHT HANDLE (MISCELLANEOUS) ×1 IMPLANT
DRAPE C-ARM 42X72 X-RAY (DRAPES) ×1 IMPLANT
DRAPE STERI IOBAN 125X83 (DRAPES) ×1 IMPLANT
DRAPE U-SHAPE 47X51 STRL (DRAPES) ×3 IMPLANT
DRSG AQUACEL AG ADV 3.5X10 (GAUZE/BANDAGES/DRESSINGS) ×1 IMPLANT
DURAPREP 26ML APPLICATOR (WOUND CARE) ×1 IMPLANT
ELECT BLADE 4.0 EZ CLEAN MEGAD (MISCELLANEOUS) ×1
ELECT BLADE 6.5 EXT (BLADE) IMPLANT
ELECT REM PT RETURN 9FT ADLT (ELECTROSURGICAL) ×1
ELECTRODE BLDE 4.0 EZ CLN MEGD (MISCELLANEOUS) ×1 IMPLANT
ELECTRODE REM PT RTRN 9FT ADLT (ELECTROSURGICAL) ×1 IMPLANT
FACESHIELD WRAPAROUND (MASK) ×2 IMPLANT
FACESHIELD WRAPAROUND OR TEAM (MASK) ×2 IMPLANT
GLOVE BIOGEL PI IND STRL 8 (GLOVE) ×2 IMPLANT
GLOVE ECLIPSE 8.0 STRL XLNG CF (GLOVE) ×1 IMPLANT
GLOVE ORTHO TXT STRL SZ7.5 (GLOVE) ×2 IMPLANT
GOWN STRL REUS W/ TWL LRG LVL3 (GOWN DISPOSABLE) ×2 IMPLANT
GOWN STRL REUS W/ TWL XL LVL3 (GOWN DISPOSABLE) ×2 IMPLANT
GOWN STRL REUS W/TWL LRG LVL3 (GOWN DISPOSABLE) ×2
GOWN STRL REUS W/TWL XL LVL3 (GOWN DISPOSABLE) ×2
HANDPIECE INTERPULSE COAX TIP (DISPOSABLE)
HEAD BIPOLAR AML DEPUY 42 (Hips) IMPLANT
HEAD FEM STD 28X+1.5 STRL (Hips) IMPLANT
KIT BASIN OR (CUSTOM PROCEDURE TRAY) ×1 IMPLANT
KIT TURNOVER KIT B (KITS) ×1 IMPLANT
MANIFOLD NEPTUNE II (INSTRUMENTS) ×1 IMPLANT
NS IRRIG 1000ML POUR BTL (IV SOLUTION) ×1 IMPLANT
PACK TOTAL JOINT (CUSTOM PROCEDURE TRAY) ×1 IMPLANT
PAD ARMBOARD 7.5X6 YLW CONV (MISCELLANEOUS) ×1 IMPLANT
SET HNDPC FAN SPRY TIP SCT (DISPOSABLE) ×1 IMPLANT
STAPLER VISISTAT 35W (STAPLE) IMPLANT
STEM FEMORAL SZ 6MM STD ACTIS (Stem) IMPLANT
STRIP CLOSURE SKIN 1/2X4 (GAUZE/BANDAGES/DRESSINGS) ×2 IMPLANT
SUT ETHIBOND NAB CT1 #1 30IN (SUTURE) ×1 IMPLANT
SUT MNCRL AB 4-0 PS2 18 (SUTURE) IMPLANT
SUT VIC AB 0 CT1 27 (SUTURE) ×1
SUT VIC AB 0 CT1 27XBRD ANBCTR (SUTURE) ×1 IMPLANT
SUT VIC AB 1 CT1 27 (SUTURE) ×1
SUT VIC AB 1 CT1 27XBRD ANBCTR (SUTURE) ×1 IMPLANT
SUT VIC AB 2-0 CT1 27 (SUTURE) ×1
SUT VIC AB 2-0 CT1 TAPERPNT 27 (SUTURE) ×1 IMPLANT
TOWEL GREEN STERILE (TOWEL DISPOSABLE) ×1 IMPLANT
TOWEL GREEN STERILE FF (TOWEL DISPOSABLE) ×1 IMPLANT
TRAY CATH 16FR W/PLASTIC CATH (SET/KITS/TRAYS/PACK) IMPLANT
TRAY FOLEY W/BAG SLVR 16FR (SET/KITS/TRAYS/PACK) ×1
TRAY FOLEY W/BAG SLVR 16FR ST (SET/KITS/TRAYS/PACK) IMPLANT
WATER STERILE IRR 1000ML POUR (IV SOLUTION) ×2 IMPLANT

## 2021-11-03 NOTE — Anesthesia Preprocedure Evaluation (Addendum)
Anesthesia Evaluation  Patient identified by MRN, date of birth, ID band Patient confused    Reviewed: Allergy & Precautions, NPO status , Patient's Chart, lab work & pertinent test results  Airway Mallampati: II  TM Distance: >3 FB Neck ROM: Full    Dental  (+) Lower Dentures   Pulmonary neg pulmonary ROS, former smoker,    Pulmonary exam normal        Cardiovascular hypertension, Pt. on medications  Rhythm:Regular Rate:Normal     Neuro/Psych  Headaches, Anxiety    GI/Hepatic negative GI ROS, Neg liver ROS,   Endo/Other  negative endocrine ROS  Renal/GU CRFRenal disease  negative genitourinary   Musculoskeletal  (+) Arthritis , Hip fracture   Abdominal Normal abdominal exam  (+)   Peds  Hematology  (+) Blood dyscrasia, anemia ,   Anesthesia Other Findings   Reproductive/Obstetrics                           Anesthesia Physical Anesthesia Plan  ASA: 3  Anesthesia Plan: General   Post-op Pain Management:    Induction: Intravenous  PONV Risk Score and Plan: 3 and Ondansetron, Dexamethasone and Treatment may vary due to age or medical condition  Airway Management Planned: Mask and Oral ETT  Additional Equipment: None  Intra-op Plan:   Post-operative Plan: Extubation in OR  Informed Consent: I have reviewed the patients History and Physical, chart, labs and discussed the procedure including the risks, benefits and alternatives for the proposed anesthesia with the patient or authorized representative who has indicated his/her understanding and acceptance.     Dental advisory given and Consent reviewed with POA  Plan Discussed with: CRNA  Anesthesia Plan Comments: (- patient received lovenox 66m 10/13 @2200 . Will plan for GA vs neuraxial  Lab Results      Component                Value               Date                      WBC                      5.0                  11/03/2021                HGB                      10.1 (L)            11/03/2021                HCT                      30.3 (L)            11/03/2021                MCV                      87.8                11/03/2021                PLT  131 (L)             11/03/2021           Lab Results      Component                Value               Date                      NA                       137                 11/03/2021                K                        3.7                 11/03/2021                CO2                      24                  11/03/2021                GLUCOSE                  103 (H)             11/03/2021                BUN                      25 (H)              11/03/2021                CREATININE               1.31 (H)            11/03/2021                CALCIUM                  8.9                 11/03/2021                EGFR                     36 (L)              07/19/2021                GFRNONAA                 38 (L)              11/03/2021          )       Anesthesia Quick Evaluation

## 2021-11-03 NOTE — Anesthesia Postprocedure Evaluation (Signed)
Anesthesia Post Note  Patient: Whitney Henderson  Procedure(s) Performed: LEFT HEMI HIP ARTHROPLASTY ANTERIOR APPROACH (Left: Hip)     Patient location during evaluation: PACU Anesthesia Type: General Level of consciousness: awake and alert Pain management: pain level controlled Vital Signs Assessment: post-procedure vital signs reviewed and stable Respiratory status: spontaneous breathing, nonlabored ventilation, respiratory function stable and patient connected to nasal cannula oxygen Cardiovascular status: blood pressure returned to baseline and stable Postop Assessment: no apparent nausea or vomiting Anesthetic complications: no   No notable events documented.  Last Vitals:  Vitals:   11/03/21 1145 11/03/21 1202  BP: (!) 135/59 126/66  Pulse: (!) 59 66  Resp: 16 17  Temp:  36.5 C  SpO2:  99%    Last Pain:  Vitals:   11/03/21 1145  TempSrc:   PainSc: 0-No pain                 Belenda Cruise P Nawaf Strange

## 2021-11-03 NOTE — Evaluation (Signed)
Physical Therapy Evaluation Patient Details Name: Whitney Henderson MRN: [id number] DOB: [birth year] Today's Date: 11/03/2021  History of Present Illness  The pt is a 88 yo female presenting 10/13 after a fall resulting in L hip fx. Now s/p direct anterior bipolar hemiarthroplasty of L hip on 10/14. PMH includes: dementia, anxiety, HTN, and CKD III.   Clinical Impression  Pt in bed upon arrival of PT, agreeable to evaluation at this time. Prior to admission the pt was mobilizing with use of SPC in her home and community, independent with ADLs and IADLs at home but is no longer driving. The pt now presents with limitations in functional mobility, strength, stability, ROM, activity tolerance, and cognition due to above dx, and will continue to benefit from skilled PT to address these deficits. The pt required mod-totalA to complete bed mobility due to poor strength and ROM of BLE as well as poor motor planning and impaired command following at this time. She was able to complete sit-stand transfer x2 but needed modA initially and max-totalA on second attempt due to poor functional use of BLE or UE on RW. The pt also presents with strong posterior lean and makes no attempt to correct at this time. Given level of assist needed at this time, will benefit from SNF rehab to facilitate return to independence.         Recommendations for follow up therapy are one component of a multi-disciplinary discharge planning process, led by the attending physician.  Recommendations may be updated based on patient status, additional functional criteria and insurance authorization.  Follow Up Recommendations Skilled nursing-short term rehab (<3 hours/day) Can patient physically be transported by private vehicle: No    Assistance Recommended at Discharge Frequent or constant Supervision/Assistance  Patient can return home with the following  A lot of help with walking and/or transfers;A lot of help with  bathing/dressing/bathroom;Direct supervision/assist for medications management;Assistance with cooking/housework;Assistance with feeding;Direct supervision/assist for financial management;Assist for transportation;Help with stairs or ramp for entrance    Equipment Recommendations Rolling walker (2 wheels);BSC/3in1  Recommendations for Other Services  OT consult    Functional Status Assessment Patient has had a recent decline in their functional status and demonstrates the ability to make significant improvements in function in a reasonable and predictable amount of time.     Precautions / Restrictions Precautions Precautions: Fall Precaution Comments: admitted for fall Restrictions Weight Bearing Restrictions: Yes LLE Weight Bearing: Weight bearing as tolerated      Mobility  Bed Mobility Overal bed mobility: Needs Assistance Bed Mobility: Supine to Sit, Sit to Supine     Supine to sit: Mod assist, HOB elevated Sit to supine: Total assist   General bed mobility comments: modA to sequentially move BLE to EOB and then max cues for pt to use UE on bed rails to pull and assist wtih trunk movement. pt unable to scoot despite cues. unclear if cognition, strength, motor planning or combo. totalA to return to bed, pt with no initiation of movement    Transfers Overall transfer level: Needs assistance Equipment used: Rolling walker (2 wheels) Transfers: Sit to/from Stand Sit to Stand: Mod assist, Max assist           General transfer comment: modA on initial attempt, max-totalA on second attempt with limited wt bearing on LLE. strong posterior lean maintained    Ambulation/Gait               General Gait Details: unable at this time  Balance Overall balance assessment: Needs assistance Sitting-balance support: Bilateral upper extremity supported, Feet supported Sitting balance-Leahy Scale: Fair Sitting balance - Comments: initially needing modA but progressed to  minG with time Postural control: Posterior lean Standing balance support: Bilateral upper extremity supported Standing balance-Leahy Scale: Poor Standing balance comment: posterior lean, dependent on therapist support and BUE support                             Pertinent Vitals/Pain Pain Assessment Pain Assessment: Faces Faces Pain Scale: Hurts even more Pain Location: L leg with mobiltiy and wt bearing Pain Descriptors / Indicators: Discomfort, Operative site guarding, Sore    Home Living Family/patient expects to be discharged to:: Private residence Living Arrangements: Alone Available Help at Discharge: Family;Available PRN/intermittently Type of Home: House Home Access: Stairs to enter Entrance Stairs-Rails: Chemical engineer of Steps: 1 at back, 2 at front with rail   Home Layout: One level Home Equipment: Cane - single point;Shower seat;Grab bars - tub/shower;Grab bars - toilet;Rolling Walker (2 wheels)      Prior Function Prior Level of Function : Independent/Modified Independent             Mobility Comments: walking with SPC, no other falls in last 6 months ADLs Comments: independent but not driving     Hand Dominance   Dominant Hand: Right    Extremity/Trunk Assessment   Upper Extremity Assessment Upper Extremity Assessment: Generalized weakness;RUE deficits/detail;LUE deficits/detail RUE Deficits / Details: able to demo good strength after repeated cues and instruction but unable to hold cup without assist initially RUE Sensation: WNL RUE Coordination: decreased fine motor LUE Deficits / Details: cold to the touch. able to demo good strength after repeated cues and instruction but unable to hold cup without assist initially. slowed movements. with pt needing hand-over-hand assist to manage LUE Coordination: decreased fine motor    Lower Extremity Assessment Lower Extremity Assessment: Generalized weakness;LLE  deficits/detail LLE Deficits / Details: limited assessment due to pain, able to activate for AAROM LLE: Unable to fully assess due to pain LLE Sensation: WNL LLE Coordination: WNL    Cervical / Trunk Assessment Cervical / Trunk Assessment: Kyphotic  Communication   Communication: No difficulties  Cognition Arousal/Alertness: Lethargic Behavior During Therapy: Flat affect Overall Cognitive Status: Impaired/Different from baseline Area of Impairment: Following commands, Safety/judgement, Awareness, Problem solving                       Following Commands: Follows one step commands inconsistently, Follows one step commands with increased time Safety/Judgement: Decreased awareness of safety, Decreased awareness of deficits Awareness: Intellectual Problem Solving: Slow processing, Decreased initiation, Difficulty sequencing, Requires verbal cues General Comments: pt with slowed responses and needing increased cues for sequencing all tasks. soiled of BM upon arrival and had not alerted staff        General Comments General comments (skin integrity, edema, etc.): VSS on RA        Assessment/Plan    PT Assessment Patient needs continued PT services  PT Problem List Decreased strength;Decreased range of motion;Decreased activity tolerance;Decreased balance;Decreased mobility;Decreased coordination;Decreased cognition;Decreased safety awareness;Pain       PT Treatment Interventions DME instruction;Gait training;Stair training;Functional mobility training;Therapeutic activities;Therapeutic exercise;Balance training;Patient/family education    PT Goals (Current goals can be found in the Care Plan section)  Acute Rehab PT Goals Patient Stated Goal: return home PT Goal Formulation: With patient Time For Goal Achievement:  11/17/21 Potential to Achieve Goals: Good    Frequency Min 3X/week        AM-PAC PT "6 Clicks" Mobility  Outcome Measure Help needed turning from  your back to your side while in a flat bed without using bedrails?: A Lot Help needed moving from lying on your back to sitting on the side of a flat bed without using bedrails?: A Lot Help needed moving to and from a bed to a chair (including a wheelchair)?: Total Help needed standing up from a chair using your arms (e.g., wheelchair or bedside chair)?: A Lot Help needed to walk in hospital room?: Total Help needed climbing 3-5 steps with a railing? : Total 6 Click Score: 9    End of Session Equipment Utilized During Treatment: Gait belt Activity Tolerance: Patient limited by fatigue;Patient limited by pain Patient left: in bed;with call bell/phone within reach;with bed alarm set;with family/visitor present Nurse Communication: Mobility status PT Visit Diagnosis: Other abnormalities of gait and mobility (R26.89);Muscle weakness (generalized) (M62.81);Pain Pain - Right/Left: Left Pain - part of body: Hip    Time: 6144-3154 PT Time Calculation (min) (ACUTE ONLY): 45 min   Charges:   PT Evaluation $PT Eval Moderate Complexity: 1 Mod PT Treatments $Therapeutic Exercise: 8-22 mins $Therapeutic Activity: 8-22 mins        West Carbo, PT, DPT   Acute Rehabilitation Department  Sandra Cockayne 11/03/2021, 4:17 PM

## 2021-11-03 NOTE — Interval H&P Note (Signed)
History and Physical Interval Note: I have spoken with the patient and her daughter at the bedside.  I was able to go over the x-rays with the patient's daughter.  She has a left hip femoral neck fracture.  Her chart has been reviewed as well as vital signs.  I explained what surgery will involve with a left partial versus total hip arthroplasty to treat her displaced acute left hip femoral neck fracture.  The risks and benefits of surgery been explained in detail and informed consent has been obtained.  The left operative hip has been marked.  11/03/2021 8:10 AM  Whitney Henderson  has presented today for surgery, with the diagnosis of Hip Fracture.  The various methods of treatment have been discussed with the patient and family. After consideration of risks, benefits and other options for treatment, the patient has consented to  Procedure(s): LEFT HEMI HIP vs TOTAL HIP ARTHROPLASTY ANTERIOR APPROACH (Left) as a surgical intervention.  The patient's history has been reviewed, patient examined, no change in status, stable for surgery.  I have reviewed the patient's chart and labs.  Questions were answered to the patient's satisfaction.     Mcarthur Rossetti

## 2021-11-03 NOTE — Op Note (Signed)
Operative Note  Date of surgery: 11/03/2021 Preoperative diagnosis: Left hip displaced femoral neck fracture Postoperative diagnosis: Same  Procedure: Left direct anterior hip bipolar hemiarthroplasty  Implants: DePuy Actis femoral component size 6 with standard offset, 42/28+1.5 bipolar metal hip ball  Surgeon: Lind Guest. Ninfa Linden, MD Assistant: Benita Stabile, PA-C  Anesthesia: General Antibiotics: 2 g IV Ancef Blood loss: 564 cc Complications: None  Indications: The patient is a 88 year old female who sustained an accidental mechanical fall yesterday and was found to have a left hip femoral neck fracture.  This was a completely displaced fracture and she had inability to ambulate.  She was brought to the [facility] emergency room and found to have this hip fracture.  She was graciously admitted to the medicine service last night and presents for definitive surgery on the left hip today.  I talked with the patient at length as well as her daughter at the bedside who is a healthcare power of attorney.  We discussed operative and nonoperative treatment modalities.  We have recommended surgery given the fact the patient does ambulate on a regular basis and is very mobile.  She is cognitively stable as well.  I did describe the risk of acute blood loss anemia, nerve vessel injury, fracture, infection, DVT, implant failure, dislocation and wound healing issues.  She understands her goals are improving her mobility, decreasing her pain and improve her quality of life.  Procedure description after informed consent was obtained and the appropriate left hip was marked, the patient was brought to the operating room where general anesthesia was obtained while she was on the stretcher.  A Foley catheter was placed and then traction boots were placed on both her feet.  Next she was placed supine on the Hana fracture table with a perineal post in place in both legs and inline skeletal traction devices but  no traction applied.  We assessed her left hip geographically under direct fluoroscopy.  We then prepped the left hip with DuraPrep and sterile drapes.  A timeout was called and she identified as the correct patient the correct left hip.  An incision was made then just inferior and posterior to the ASIS and carried slightly obliquely down the leg.  Dissection was carried down to the tensor fascia lata muscle and tensor fascia was then divided longitudinally to proceed with the range of motion the hip.  Circumflex vessels were identified and cauterized.  The hip capsule was then divided and opened up in an L-type format finding a moderate hemarthrosis consistent with an acute femoral neck fracture and we can easily see that the femoral neck was fractured significantly.  We made a freshening femoral neck cut just distal to the fracture and proximal to the lesser trochanter with an oscillating saw.  We removed remnants of the femoral neck.  A corkscrew guide was placed in the femoral head and the femoral head was removed in its entirety and we templated for a size 42 bipolar hip ball head.  We removed any bone debris from the acetabulum.  Attention was then turned to the femur.  With the leg externally rotated 120 degrees as well as extended and a deducted, a Mueller retractor was placed medially and a Hohmann retractor was placed behind the greater trochanter.  The lateral joint capsule was released.  A box cutting osteotome was used to enter the femoral canal and then I began broaching using the Actis broaching system from a size 0 going to a size 6.  With a size 6 in place we trialed a standard offset femoral neck and a 42/28+1.5 bipolar trial hip ball.  We brought the leg back over and up and with traction and internal rotation reduced in the pelvis.  We assessed it clinically and radiographically and we are pleased with range of motion and stability as well as offset and leg length.  We then dislocated the hip and  removed the trial components.  We then placed the real size 6 Actis femoral component followed by the real 42/28+1.5 bipolar metal hip ball and again this was reduced in the acetabulum we are pleased with stability assessing this radiographically and clinically.  We then irrigated the soft tissue with normal saline solution.  The joint capsule was closed with interrupted #1 Ethibond suture followed by #1 Vicryl to close the tensor fascia.  0 Vicryl was used to close the deep tissue and 2-0 Vicryl used to close subcutaneous tissue.  The skin was closed with staples.  An Aquacel dressing was applied.  The patient was taken off of the Hana table awakened, extubated and taken to recovery in stable addition.  Benita Stabile PA-C assisted in entire case and his assistance was medically necessary for helping retract soft tissues and soft tissue management as well as helping guide implant placement.  He was directly involved with a layered closure of the wound.  All final counts were correct and there were no complications noted.

## 2021-11-03 NOTE — Progress Notes (Signed)
PROGRESS NOTE    Whitney Henderson  CLE:[id number] DOB: [birth year] DOA: 11/02/2021 PCP: [name], MD  Outpatient Specialists:     Brief Narrative:  As per H&P done on admission: "Whitney Henderson is a 88 y.o. female with medical history significant of hypertension, chronic kidney disease stage IIIb, and memory issues presents after fall.  At baseline patient walks around with use of a cane.  She had been coming back inside from outside and was walking into the laundry room when her feet get tingly and then she fell down and then onto her left hip.  She was able to get up with the assistance of her daughter and, but was unable to bear weight on her left leg.   Upon admission into the emergency department patient was seen to be mildly tachypneic with blood pressures 147/62 to 179/70, and all other vital signs maintained.  Labs noted hemoglobin 11.5, platelets 146, and creatinine 1.23.  X-rays revealed displaced fracture of the neck of the left femur".  11/03/2021: Patient seen.  Patient is alert.  Patient has undergone surgery.  Orthopedic input is appreciated.  Patient underwent left direct anterior hip bipolar hemiarthroplasty today.  Orthopedic team is directing patient's care postoperatively.   Assessment & Plan:   Principal Problem:   Closed left hip fracture ([place]) Active Problems:   Memory disorder   Hypertension, essential, benign   Chronic renal disease, stage III (HCC)   Fall at home, initial encounter   Normocytic anemia   Hip fracture requiring operative repair ([place])   Left femoral neck fracture secondary to fall -Patient has undergone surgery. -See above documentation. -Pain is controlled. -Orthopedic team is managing patient postoperatively.    Essential hypertension Blood pressures controlled.   Continue current regimen. Control pain.     Normocytic anemia Hemoglobin 10.1 g/dL  Monitor closely.  Chronic kidney disease stage IIIb -Stable. -Continue to  monitor closely.     Mild memory impairment -Delirium precautions   DVT prophylaxis: Subcutaneous Lovenox 30 mg daily Code Status: Full code Family Communication: Daughter Disposition Plan: This will depend on hospital course.  Suspect SNF   Consultants:  Orthopedics  Procedures:  Left hip hemiarthroplasty  Antimicrobials:  None   Subjective: No complaints. Pain is controlled.  Objective: Vitals:   11/03/21 1100 11/03/21 1130 11/03/21 1145 11/03/21 1202  BP: 139/62 129/63 (!) 135/59 126/66  Pulse: (!) 57 60 (!) 59 66  Resp: '13 17 16 17  '$ Temp: 97.6 F (36.4 C)   97.7 F (36.5 C)  TempSrc:      SpO2: 96%   99%  Weight:      Height:        Intake/Output Summary (Last 24 hours) at 11/03/2021 1520 Last data filed at 11/03/2021 0954 Gross per 24 hour  Intake --  Output 350 ml  Net -350 ml   Filed Weights   11/02/21 1400 11/03/21 0808  Weight: 54.4 kg 54.4 kg    Examination:  General exam: Appears calm and comfortable.  Patient is thin. Respiratory system: Clear to auscultation.  Cardiovascular system: S1 & S2 heard Gastrointestinal system: Abdomen is nondistended, soft and nontender.  Central nervous system: Awake and alert.   Extremities: No leg edema.  Data Reviewed: I have personally reviewed following labs and imaging studies  CBC: Recent Labs  Lab 11/02/21 1533 11/03/21 0306  WBC 6.2 5.0  NEUTROABS 4.8  --   HGB 11.5* 10.1*  HCT 34.0* 30.3*  MCV 87.0 87.8  PLT  146* 505*   Basic Metabolic Panel: Recent Labs  Lab 11/02/21 1533 11/03/21 0306  NA 138 137  K 4.0 3.7  CL 106 104  CO2 21* 24  GLUCOSE 109* 103*  BUN 23 25*  CREATININE 1.23* 1.31*  CALCIUM 9.2 8.9   GFR: Estimated Creatinine Clearance: 22.1 mL/min (A) (by C-G formula based on SCr of 1.31 mg/dL (H)). Liver Function Tests: No results for input(s): "AST", "ALT", "ALKPHOS", "BILITOT", "PROT", "ALBUMIN" in the last 168 hours. No results for input(s): "LIPASE", "AMYLASE"  in the last 168 hours. No results for input(s): "AMMONIA" in the last 168 hours. Coagulation Profile: No results for input(s): "INR", "PROTIME" in the last 168 hours. Cardiac Enzymes: No results for input(s): "CKTOTAL", "CKMB", "CKMBINDEX", "TROPONINI" in the last 168 hours. BNP (last 3 results) No results for input(s): "PROBNP" in the last 8760 hours. HbA1C: No results for input(s): "HGBA1C" in the last 72 hours. CBG: No results for input(s): "GLUCAP" in the last 168 hours. Lipid Profile: No results for input(s): "CHOL", "HDL", "LDLCALC", "TRIG", "CHOLHDL", "LDLDIRECT" in the last 72 hours. Thyroid Function Tests: No results for input(s): "TSH", "T4TOTAL", "FREET4", "T3FREE", "THYROIDAB" in the last 72 hours. Anemia Panel: No results for input(s): "VITAMINB12", "FOLATE", "FERRITIN", "TIBC", "IRON", "RETICCTPCT" in the last 72 hours. Urine analysis:    Component Value Date/Time   COLORURINE YELLOW 11/02/2021 1800   APPEARANCEUR HAZY (A) 11/02/2021 1800   LABSPEC 1.011 11/02/2021 1800   PHURINE 7.0 11/02/2021 1800   GLUCOSEU NEGATIVE 11/02/2021 1800   HGBUR NEGATIVE 11/02/2021 1800   BILIRUBINUR NEGATIVE 11/02/2021 1800   BILIRUBINUR negative 04/16/2021 1214   KETONESUR NEGATIVE 11/02/2021 1800   PROTEINUR NEGATIVE 11/02/2021 1800   UROBILINOGEN 0.2 04/16/2021 1214   UROBILINOGEN 0.2 01/16/2013 1613   NITRITE POSITIVE (A) 11/02/2021 1800   LEUKOCYTESUR SMALL (A) 11/02/2021 1800   Sepsis Labs: '@LABRCNTIP'$ (procalcitonin:4,lacticidven:4)  ) Recent Results (from the past 240 hour(s))  Surgical pcr screen     Status: None   Collection Time: 11/03/21  9:03 AM   Specimen: Nasal Mucosa; Nasal Swab  Result Value Ref Range Status   MRSA, PCR NEGATIVE NEGATIVE Final   Staphylococcus aureus NEGATIVE NEGATIVE Final    Comment: (NOTE) The Xpert SA Assay (FDA approved for NASAL specimens in patients 37 years of age and older), is one component of a comprehensive surveillance program.  It is not intended to diagnose infection nor to guide or monitor treatment. Performed at [facility], [place] [address], [place], [place] [address]          Radiology Studies: DG HIP UNILAT WITH PELVIS 2-3 VIEWS LEFT  Result Date: 11/03/2021 CLINICAL DATA:  Left hip surgery EXAM: DG HIP (WITH OR WITHOUT PELVIS) 2-3V LEFT; DG C-ARM 1-60 MIN-NO REPORT COMPARISON:  11/02/2021 FINDINGS: Three C-arm fluoroscopic images were obtained intraoperatively and submitted for post operative interpretation. Images demonstrate left total hip arthroplasty hardware in place. 11 seconds fluoroscopy time utilized. Radiation dose: 0.75 mGy. Please see the performing provider's procedural report for further detail. IMPRESSION: Fluoroscopic guidance for left total hip arthroplasty. Electronically Signed   By: Davina Poke D.O.   On: 11/03/2021 11:48   DG C-Arm 1-60 Min-No Report  Result Date: 11/03/2021 Fluoroscopy was utilized by the requesting physician.  No radiographic interpretation.   DG Chest 2 View  Result Date: 11/02/2021 CLINICAL DATA:  Mediastinal fullness on C-spine CT. EXAM: CHEST - 2 VIEW COMPARISON:  04/25/2020 FINDINGS: The lungs are clear without focal pneumonia, edema, pneumothorax  or pleural effusion. Skin fold noted over lower right lung. The cardiopericardial silhouette is within normal limits for size. Cardiomediastinal contours are stable in the interval. Bones are diffusely demineralized. Telemetry leads overlie the chest. IMPRESSION: No active cardiopulmonary disease. Cardiomediastinal contours are stable since 04/25/2020. Electronically Signed   By: Misty Stanley M.D.   On: 11/02/2021 17:46   CT Head Wo Contrast  Result Date: 11/02/2021 CLINICAL DATA:  Fall EXAM: CT HEAD WITHOUT CONTRAST CT CERVICAL SPINE WITHOUT CONTRAST TECHNIQUE: Multidetector CT imaging of the head and cervical spine was performed following the standard protocol without intravenous contrast. Multiplanar  CT image reconstructions of the cervical spine were also generated. RADIATION DOSE REDUCTION: This exam was performed according to the departmental dose-optimization program which includes automated exposure control, adjustment of the mA and/or kV according to patient size and/or use of iterative reconstruction technique. COMPARISON:  CT head and cervical spine 12/20/2020 FINDINGS: CT HEAD FINDINGS Brain: There is no acute intracranial hemorrhage, extra-axial fluid collection, or acute infarct. There is unchanged mild parenchymal volume loss with prominence of the ventricular system and extra-axial CSF spaces. The ventricles are stable in size gray-white differentiation is preserved. There is no mass lesion.  There is no mass effect or midline shift. Vascular: There is calcification of the bilateral carotid siphons. Skull: Normal. Negative for fracture or focal lesion. Sinuses/Orbits: The paranasal sinuses are clear. Bilateral lens implants are in place. The globes and orbits are otherwise unremarkable. Other: None. CT CERVICAL SPINE FINDINGS Alignment: Normal. There is no jumped or perched facet or other evidence of traumatic malalignment. Skull base and vertebrae: Skull base alignment is maintained. Vertebral body heights are preserved. There is no evidence of acute fracture. There is no suspicious osseous lesion. Soft tissues and spinal canal: No prevertebral fluid or swelling. No visible canal hematoma. Disc levels: There is overall mild for age degenerative change of the cervical spine, with disc space narrowing degenerative endplate change most advanced at C5-C6. Upper chest: Imaged lung apices are clear. Other: On the scout radiograph, there is fullness of the mediastinum and AP window was not present on prior chest radiographs. IMPRESSION: 1. No acute intracranial pathology. 2. No acute fracture or traumatic malalignment of the cervical spine. 3. Fullness of the mediastinum and AP window on the scout  radiograph not present on prior radiographs. Recommend dedicated PA and lateral chest radiographs for better evaluation. Electronically Signed   By: Valetta Mole M.D.   On: 11/02/2021 15:34   CT Cervical Spine Wo Contrast  Result Date: 11/02/2021 CLINICAL DATA:  Fall EXAM: CT HEAD WITHOUT CONTRAST CT CERVICAL SPINE WITHOUT CONTRAST TECHNIQUE: Multidetector CT imaging of the head and cervical spine was performed following the standard protocol without intravenous contrast. Multiplanar CT image reconstructions of the cervical spine were also generated. RADIATION DOSE REDUCTION: This exam was performed according to the departmental dose-optimization program which includes automated exposure control, adjustment of the mA and/or kV according to patient size and/or use of iterative reconstruction technique. COMPARISON:  CT head and cervical spine 12/20/2020 FINDINGS: CT HEAD FINDINGS Brain: There is no acute intracranial hemorrhage, extra-axial fluid collection, or acute infarct. There is unchanged mild parenchymal volume loss with prominence of the ventricular system and extra-axial CSF spaces. The ventricles are stable in size gray-white differentiation is preserved. There is no mass lesion.  There is no mass effect or midline shift. Vascular: There is calcification of the bilateral carotid siphons. Skull: Normal. Negative for fracture or focal lesion. Sinuses/Orbits: The paranasal  sinuses are clear. Bilateral lens implants are in place. The globes and orbits are otherwise unremarkable. Other: None. CT CERVICAL SPINE FINDINGS Alignment: Normal. There is no jumped or perched facet or other evidence of traumatic malalignment. Skull base and vertebrae: Skull base alignment is maintained. Vertebral body heights are preserved. There is no evidence of acute fracture. There is no suspicious osseous lesion. Soft tissues and spinal canal: No prevertebral fluid or swelling. No visible canal hematoma. Disc levels: There is  overall mild for age degenerative change of the cervical spine, with disc space narrowing degenerative endplate change most advanced at C5-C6. Upper chest: Imaged lung apices are clear. Other: On the scout radiograph, there is fullness of the mediastinum and AP window was not present on prior chest radiographs. IMPRESSION: 1. No acute intracranial pathology. 2. No acute fracture or traumatic malalignment of the cervical spine. 3. Fullness of the mediastinum and AP window on the scout radiograph not present on prior radiographs. Recommend dedicated PA and lateral chest radiographs for better evaluation. Electronically Signed   By: Valetta Mole M.D.   On: 11/02/2021 15:34   DG Knee Complete 4 Views Left  Result Date: 11/02/2021 CLINICAL DATA:  Trauma, fall EXAM: LEFT KNEE - COMPLETE 4+ VIEW COMPARISON:  None Available. FINDINGS: No evidence of fracture, dislocation, or joint effusion. No evidence of arthropathy or other focal bone abnormality. Soft tissues are unremarkable. IMPRESSION: No fracture or dislocation is seen in left knee. Electronically Signed   By: Elmer Picker M.D.   On: 11/02/2021 14:47   DG Hip Unilat With Pelvis 2-3 Views Left  Result Date: 11/02/2021 CLINICAL DATA:  Trauma, fall EXAM: DG HIP (WITH OR WITHOUT PELVIS) 2-3V LEFT COMPARISON:  None Available. FINDINGS: Fracture is seen in the neck of left femur. There is superior and lateral displacement of distal major fracture fragment. There is no dislocation. There is anterior angulation at the fracture site. IMPRESSION: Displaced fracture is seen in the neck of left femur. Electronically Signed   By: Elmer Picker M.D.   On: 11/02/2021 14:46        Scheduled Meds:  amLODipine  2.5 mg Oral Daily   ascorbic acid  500 mg Oral QODAY   aspirin  81 mg Oral BID   docusate sodium  100 mg Oral BID   enoxaparin (LOVENOX) injection  30 mg Subcutaneous Q24H   senna  1 tablet Oral BID   sertraline  25 mg Oral QHS   thiamine   100 mg Oral Daily   Continuous Infusions:  sodium chloride 75 mL/hr at 11/03/21 1219   ceFAZolin     tranexamic acid       LOS: 1 day    Time spent: 35 minutes    Dana Allan, MD  [facility] Pager #: [phone] 7PM-7AM contact night coverage as above

## 2021-11-03 NOTE — Transfer of Care (Addendum)
Immediate Anesthesia Transfer of Care Note  Patient: Whitney Henderson  Procedure(s) Performed: LEFT HEMI HIP ARTHROPLASTY ANTERIOR APPROACH (Left: Hip)  Patient Location: PACU  Anesthesia Type:General  Level of Consciousness: drowsy  Airway & Oxygen Therapy: Patient Spontanous Breathing and Patient connected to nasal cannula oxygen  Post-op Assessment: Report given to RN and Post -op Vital signs reviewed and stable  Post vital signs: Reviewed and stable  Last Vitals:  Vitals Value Taken Time  BP 92/57   Temp    Pulse 58 11/03/21 1034  Resp 15 11/03/21 1034  SpO2 92 % 11/03/21 1034  Vitals shown include unvalidated device data.  Last Pain:  Vitals:   11/03/21 0813  TempSrc: Oral  PainSc:          Complications: No notable events documented.

## 2021-11-04 DIAGNOSIS — S72002A Fracture of unspecified part of neck of left femur, initial encounter for closed fracture: Secondary | ICD-10-CM | POA: Diagnosis not present

## 2021-11-04 LAB — CBC WITH DIFFERENTIAL/PLATELET
Abs Immature Granulocytes: 0.03 10*3/uL (ref 0.00–0.07)
Basophils Absolute: 0 10*3/uL (ref 0.0–0.1)
Basophils Relative: 0 %
Eosinophils Absolute: 0 10*3/uL (ref 0.0–0.5)
Eosinophils Relative: 0 %
HCT: 26.6 % — ABNORMAL LOW (ref 36.0–46.0)
Hemoglobin: 9 g/dL — ABNORMAL LOW (ref 12.0–15.0)
Immature Granulocytes: 0 %
Lymphocytes Relative: 8 %
Lymphs Abs: 0.6 10*3/uL — ABNORMAL LOW (ref 0.7–4.0)
MCH: 29.3 pg (ref 26.0–34.0)
MCHC: 33.8 g/dL (ref 30.0–36.0)
MCV: 86.6 fL (ref 80.0–100.0)
Monocytes Absolute: 0.9 10*3/uL (ref 0.1–1.0)
Monocytes Relative: 11 %
Neutro Abs: 6.4 10*3/uL (ref 1.7–7.7)
Neutrophils Relative %: 81 %
Platelets: 131 10*3/uL — ABNORMAL LOW (ref 150–400)
RBC: 3.07 MIL/uL — ABNORMAL LOW (ref 3.87–5.11)
RDW: 14 % (ref 11.5–15.5)
WBC: 8 10*3/uL (ref 4.0–10.5)
nRBC: 0 % (ref 0.0–0.2)

## 2021-11-04 LAB — URINALYSIS, ROUTINE W REFLEX MICROSCOPIC
Bacteria, UA: NONE SEEN
Bilirubin Urine: NEGATIVE
Glucose, UA: NEGATIVE mg/dL
Hgb urine dipstick: NEGATIVE
Ketones, ur: 5 mg/dL — AB
Nitrite: NEGATIVE
Protein, ur: 30 mg/dL — AB
Specific Gravity, Urine: 1.041 — ABNORMAL HIGH (ref 1.005–1.030)
pH: 5 (ref 5.0–8.0)

## 2021-11-04 LAB — RENAL FUNCTION PANEL
Albumin: 2.6 g/dL — ABNORMAL LOW (ref 3.5–5.0)
Anion gap: 7 (ref 5–15)
BUN: 18 mg/dL (ref 8–23)
CO2: 23 mmol/L (ref 22–32)
Calcium: 8.2 mg/dL — ABNORMAL LOW (ref 8.9–10.3)
Chloride: 106 mmol/L (ref 98–111)
Creatinine, Ser: 1.23 mg/dL — ABNORMAL HIGH (ref 0.44–1.00)
GFR, Estimated: 41 mL/min — ABNORMAL LOW (ref >60)
Glucose, Bld: 100 mg/dL — ABNORMAL HIGH (ref 70–99)
Phosphorus: 2.9 mg/dL (ref 2.5–4.6)
Potassium: 3.7 mmol/L (ref 3.5–5.1)
Sodium: 136 mmol/L (ref 135–145)

## 2021-11-04 MED ORDER — ENSURE ENLIVE PO LIQD
237.0000 mL | Freq: Two times a day (BID) | ORAL | Status: DC
  Administered 2021-11-04: 237 mL via ORAL

## 2021-11-04 NOTE — Plan of Care (Signed)
  Problem: Education: Goal: Knowledge of the prescribed therapeutic regimen will improve Outcome: Not Progressing Goal: Understanding of discharge needs will improve Outcome: Not Progressing Goal: Individualized Educational Video(s) Outcome: Not Progressing   Problem: Activity: Goal: Ability to avoid complications of mobility impairment will improve Outcome: Not Progressing Goal: Ability to tolerate increased activity will improve Outcome: Not Progressing   Problem: Clinical Measurements: Goal: Postoperative complications will be avoided or minimized Outcome: Not Progressing   Problem: Pain Management: Goal: Pain level will decrease with appropriate interventions Outcome: Not Progressing   Problem: Skin Integrity: Goal: Will show signs of wound healing Outcome: Not Progressing   Problem: Education: Goal: Knowledge of General Education information will improve Description: Including pain rating scale, medication(s)/side effects and non-pharmacologic comfort measures Outcome: Not Progressing   Problem: Health Behavior/Discharge Planning: Goal: Ability to manage health-related needs will improve Outcome: Not Progressing   Problem: Clinical Measurements: Goal: Ability to maintain clinical measurements within normal limits will improve Outcome: Not Progressing Goal: Will remain free from infection Outcome: Not Progressing Goal: Diagnostic test results will improve Outcome: Not Progressing Goal: Respiratory complications will improve Outcome: Not Progressing Goal: Cardiovascular complication will be avoided Outcome: Not Progressing   Problem: Activity: Goal: Risk for activity intolerance will decrease Outcome: Not Progressing   Problem: Nutrition: Goal: Adequate nutrition will be maintained Outcome: Not Progressing   Problem: Coping: Goal: Level of anxiety will decrease Outcome: Not Progressing   Problem: Elimination: Goal: Will not experience complications  related to bowel motility Outcome: Not Progressing Goal: Will not experience complications related to urinary retention Outcome: Not Progressing   Problem: Pain Managment: Goal: General experience of comfort will improve Outcome: Not Progressing   Problem: Safety: Goal: Ability to remain free from injury will improve Outcome: Not Progressing   Problem: Skin Integrity: Goal: Risk for impaired skin integrity will decrease Outcome: Not Progressing   

## 2021-11-04 NOTE — Progress Notes (Signed)
Initial Nutrition Assessment RD working remotely.   DOCUMENTATION CODES:   Not applicable  INTERVENTION:  - ordered Ensure Plus High Protein BID, each supplement provides 350 kcal and 20 grams of protein.  - complete NFPE when feasible.   NUTRITION DIAGNOSIS:   Increased nutrient needs related to hip fracture, post-op healing as evidenced by estimated needs.  GOAL:   Patient will meet greater than or equal to 90% of their needs  MONITOR:   PO intake, Supplement acceptance, Labs, Weight trends, Skin  REASON FOR ASSESSMENT:   Consult Hip fracture protocol  ASSESSMENT:   88 y.o. female with medical history of HTN, stage 3 CKD, anxiety, discoid lupus, and memory issues. She presented to the ED after a fall. At baseline patient walks with a cane. She fell onto her left hip and was able to get up with the assistance of her daughter but was unable to bear weight on her left leg. In the ED, x-ray showed displaced fx of the neck of L femur.  Patient is POD #1 L direct anterior hip bipolar hemiarthroplasty.   Diet advanced to Regular yesterday at 1218 and she was able to eat 50% of lunch. No documentation from dinner last night or breakfast today.  She has not been assessed by a [place] RD at any time in the past.  Weight yesterday and 10/13 documented as 120 lb and appears to possibly be a stated weight. Weight in June was 112 lb and weight on 05/10/21 was 116 lb. Non-pitting edema to LLE documented in the edema section of flow sheet.    Labs reviewed; creatinine: 1.23 mg/dl, Ca: 8.2 mg/dl, GFR: 41 ml/min.  Medications reviewed; 500 mg ascorbic acid/day, 100 mg colace BID, 1 tablet senokot BID, 100 mg oral thiamine/day.  IVF; NS @ 75 ml/hr.    NUTRITION - FOCUSED PHYSICAL EXAM:  RD working remotely.  Diet Order:   Diet Order             Diet regular Room service appropriate? Yes; Fluid consistency: Thin  Diet effective now                   EDUCATION  NEEDS:   No education needs have been identified at this time  Skin:  Skin Assessment: Skin Integrity Issues: Skin Integrity Issues:: Incisions Incisions: L hip (10/14)  Last BM:  PTA/unknown  Height:   Ht Readings from Last 1 Encounters:  11/03/21 5' 2.01" (1.575 m)    Weight:   Wt Readings from Last 1 Encounters:  11/03/21 54.4 kg    BMI:  Body mass index is 21.94 kg/m.  Estimated Nutritional Needs:  Kcal:  1600-1800 kcal Protein:  80-90 grams Fluid:  >/= 1.7 L/day     Jarome Matin, MS, RD, LDN, CNSC Clinical Dietitian PRN/Relief staff On-call/weekend pager # available in [facility]

## 2021-11-04 NOTE — Discharge Instructions (Signed)
The patient may put full weight on her left hip and lower extremity as comfort allows. She should only be up with assistance and assistive device. The current dressing on her left hip can get wet in the shower and can stay in place until her outpatient follow-up. Obviously if the dressing becomes saturated, and nursing can change it if needed. There are no hip precautions.

## 2021-11-04 NOTE — Progress Notes (Signed)
PROGRESS NOTE    Whitney Henderson  BOF:[id number] DOB: 06-24-1928 DOA: 11/02/2021 PCP: [name], MD  Outpatient Specialists:     Brief Narrative:  As per H&P done on admission: "Whitney Henderson is a 88 y.o. female with medical history significant of hypertension, chronic kidney disease stage IIIb, and memory issues presents after fall.  At baseline patient walks around with use of a cane.  She had been coming back inside from outside and was walking into the laundry room when her feet get tingly and then she fell down and then onto her left hip.  She was able to get up with the assistance of her daughter and, but was unable to bear weight on her left leg.   Upon admission into the emergency department patient was seen to be mildly tachypneic with blood pressures 147/62 to 179/70, and all other vital signs maintained.  Labs noted hemoglobin 11.5, platelets 146, and creatinine 1.23.  X-rays revealed displaced fracture of the neck of the left femur".  11/03/2021: Patient seen.  Patient is alert.  Patient has undergone surgery.  Orthopedic input is appreciated.  Patient underwent left direct anterior hip bipolar hemiarthroplasty today.  Orthopedic team is directing patient's care postoperatively.  11/04/2021: Patient seen alongside patient's daughter.  Patient is stable.  No new complaints.  Pain is controlled.  Patient's daughter wants UTI ruled out.  No constitutional symptoms of UTI endorsed.  We will still go ahead and check urinalysis and possible urine culture as per patient's daughter's request!!   Assessment & Plan:   Principal Problem:   Closed fracture of neck of left femur ([place]) Active Problems:   Fall at home, initial encounter   Hypertension, essential, benign   Normocytic anemia   Chronic renal disease, stage III ([place])   Memory disorder   Hip fracture requiring operative repair ([place])   Left femoral neck fracture secondary to fall -Patient has undergone surgery. -See  above documentation. -Pain is controlled. -Orthopedic team is managing patient postoperatively.   -Plan is to proceed with short-term SNF  Essential hypertension Blood pressures controlled.   Continue current regimen. Control pain.     Normocytic anemia Hemoglobin 10.1 g/dL  Monitor closely.  Chronic kidney disease stage IIIb -Stable. -Continue to monitor closely.     Mild memory impairment -Delirium precautions   DVT prophylaxis: Subcutaneous Lovenox 30 mg daily Code Status: Full code Family Communication: Daughter Disposition Plan: This will depend on hospital course.  Suspect SNF   Consultants:  Orthopedics  Procedures:  Left hip hemiarthroplasty  Antimicrobials:  None   Subjective: No complaints. Pain is controlled.  Objective: Vitals:   11/04/21 0435 11/04/21 0745 11/04/21 0832 11/04/21 1637  BP: (!) 129/54  (!) 102/52 (!) 134/51  Pulse:   74 77  Resp: '19  19 18  '$ Temp: 98 F (77.8 C)  98.3 F (36.8 C) 99.9 F (37.7 C)  TempSrc: Oral  Oral Oral  SpO2: 99% 100% 98% 100%  Weight:      Height:        Intake/Output Summary (Last 24 hours) at 11/04/2021 1854 Last data filed at 11/04/2021 0615 Gross per 24 hour  Intake 1618.75 ml  Output 650 ml  Net 968.75 ml    Filed Weights   11/02/21 1400 11/03/21 0808  Weight: 54.4 kg 54.4 kg    Examination:  General exam: Appears calm and comfortable.  Patient is thin. Respiratory system: Clear to auscultation.  Cardiovascular system: S1 & S2 heard Gastrointestinal  system: Abdomen is nondistended, soft and nontender.  Central nervous system: Awake and alert.   Extremities: No leg edema.  Data Reviewed: I have personally reviewed following labs and imaging studies  CBC: Recent Labs  Lab 11/02/21 1533 11/03/21 0306 11/04/21 0456  WBC 6.2 5.0 8.0  NEUTROABS 4.8  --  6.4  HGB 11.5* 10.1* 9.0*  HCT 34.0* 30.3* 26.6*  MCV 87.0 87.8 86.6  PLT 146* 131* 131*    Basic Metabolic Panel: Recent  Labs  Lab 11/02/21 1533 11/03/21 0306 11/04/21 0456  NA 138 137 136  K 4.0 3.7 3.7  CL 106 104 106  CO2 21* 24 23  GLUCOSE 109* 103* 100*  BUN 23 25* 18  CREATININE 1.23* 1.31* 1.23*  CALCIUM 9.2 8.9 8.2*  PHOS  --   --  2.9    GFR: Estimated Creatinine Clearance: 23.6 mL/min (A) (by C-G formula based on SCr of 1.23 mg/dL (H)). Liver Function Tests: Recent Labs  Lab 11/04/21 0456  ALBUMIN 2.6*   No results for input(s): "LIPASE", "AMYLASE" in the last 168 hours. No results for input(s): "AMMONIA" in the last 168 hours. Coagulation Profile: No results for input(s): "INR", "PROTIME" in the last 168 hours. Cardiac Enzymes: No results for input(s): "CKTOTAL", "CKMB", "CKMBINDEX", "TROPONINI" in the last 168 hours. BNP (last 3 results) No results for input(s): "PROBNP" in the last 8760 hours. HbA1C: No results for input(s): "HGBA1C" in the last 72 hours. CBG: No results for input(s): "GLUCAP" in the last 168 hours. Lipid Profile: No results for input(s): "CHOL", "HDL", "LDLCALC", "TRIG", "CHOLHDL", "LDLDIRECT" in the last 72 hours. Thyroid Function Tests: No results for input(s): "TSH", "T4TOTAL", "FREET4", "T3FREE", "THYROIDAB" in the last 72 hours. Anemia Panel: No results for input(s): "VITAMINB12", "FOLATE", "FERRITIN", "TIBC", "IRON", "RETICCTPCT" in the last 72 hours. Urine analysis:    Component Value Date/Time   COLORURINE AMBER (A) 11/04/2021 1703   APPEARANCEUR HAZY (A) 11/04/2021 1703   LABSPEC 1.041 (H) 11/04/2021 1703   PHURINE 5.0 11/04/2021 1703   GLUCOSEU NEGATIVE 11/04/2021 1703   HGBUR NEGATIVE 11/04/2021 1703   BILIRUBINUR NEGATIVE 11/04/2021 1703   BILIRUBINUR negative 04/16/2021 1214   KETONESUR 5 (A) 11/04/2021 1703   PROTEINUR 30 (A) 11/04/2021 1703   UROBILINOGEN 0.2 04/16/2021 1214   UROBILINOGEN 0.2 01/16/2013 1613   NITRITE NEGATIVE 11/04/2021 1703   LEUKOCYTESUR MODERATE (A) 11/04/2021 1703   Sepsis  Labs: '@LABRCNTIP'$ (procalcitonin:4,lacticidven:4)  ) Recent Results (from the past 240 hour(s))  Surgical pcr screen     Status: None   Collection Time: 11/03/21  9:03 AM   Specimen: Nasal Mucosa; Nasal Swab  Result Value Ref Range Status   MRSA, PCR NEGATIVE NEGATIVE Final   Staphylococcus aureus NEGATIVE NEGATIVE Final    Comment: (NOTE) The Xpert SA Assay (FDA approved for NASAL specimens in patients 58 years of age and older), is one component of a comprehensive surveillance program. It is not intended to diagnose infection nor to guide or monitor treatment. Performed at [facility], [place] [address], [place], [place] [address]          Radiology Studies: DG HIP UNILAT WITH PELVIS 2-3 VIEWS LEFT  Result Date: 11/03/2021 CLINICAL DATA:  Left hip surgery EXAM: DG HIP (WITH OR WITHOUT PELVIS) 2-3V LEFT; DG C-ARM 1-60 MIN-NO REPORT COMPARISON:  11/02/2021 FINDINGS: Three C-arm fluoroscopic images were obtained intraoperatively and submitted for post operative interpretation. Images demonstrate left total hip arthroplasty hardware in place. 11 seconds fluoroscopy time utilized.  Radiation dose: 0.75 mGy. Please see the performing provider's procedural report for further detail. IMPRESSION: Fluoroscopic guidance for left total hip arthroplasty. Electronically Signed   By: Davina Poke D.O.   On: 11/03/2021 11:48   DG C-Arm 1-60 Min-No Report  Result Date: 11/03/2021 Fluoroscopy was utilized by the requesting physician.  No radiographic interpretation.        Scheduled Meds:  amLODipine  2.5 mg Oral Daily   ascorbic acid  500 mg Oral QODAY   aspirin  81 mg Oral BID   docusate sodium  100 mg Oral BID   enoxaparin (LOVENOX) injection  30 mg Subcutaneous Q24H   feeding supplement  237 mL Oral BID BM   senna  1 tablet Oral BID   sertraline  25 mg Oral QHS   thiamine  100 mg Oral Daily   Continuous Infusions:  sodium chloride Stopped (11/04/21 0530)   methocarbamol  (ROBAXIN) IV       LOS: 2 days    Time spent: 35 minutes    Dana Allan, MD  [facility] Pager #: [phone] 7PM-7AM contact night coverage as above

## 2021-11-04 NOTE — Progress Notes (Signed)
Foley removed w/o any complaints or issues.  Pt is due to void by 1230.

## 2021-11-04 NOTE — Progress Notes (Signed)
Subjective: 1 Day Post-Op Procedure(s) (LRB): LEFT HEMI HIP ARTHROPLASTY ANTERIOR APPROACH (Left) Patient reports pain as mild.  However, she has not been up with therapy yet.  However she is sitting up eating breakfast in bed and appears comfortable.  I talked her in length in detail about her surgery yesterday.  She did tolerate surgery well.  Her hemoglobin this morning is 9.0.  This is acute blood loss anemia but she is asymptomatic and her vital signs appear stable.  Objective: Vital signs in last 24 hours: Temp:  [97.6 F (36.4 C)-99.3 F (37.4 C)] 98 F (36.7 C) (10/15 0435) Pulse Rate:  [57-67] 66 (10/14 2305) Resp:  [13-19] 19 (10/15 0435) BP: (110-153)/(52-78) 129/54 (10/15 0435) SpO2:  [93 %-99 %] 99 % (10/15 0435) Weight:  [54.4 kg] 54.4 kg (10/14 0808)  Intake/Output from previous day: 10/14 0701 - 10/15 0700 In: 2285 [P.O.:840; I.V.:1345; IV Piggyback:100] Out: 1000 [Urine:850; Blood:150] Intake/Output this shift: No intake/output data recorded.  Recent Labs    11/02/21 1533 11/03/21 0306 11/04/21 0456  HGB 11.5* 10.1* 9.0*   Recent Labs    11/03/21 0306 11/04/21 0456  WBC 5.0 8.0  RBC 3.45* 3.07*  HCT 30.3* 26.6*  PLT 131* 131*   Recent Labs    11/03/21 0306 11/04/21 0456  NA 137 136  K 3.7 3.7  CL 104 106  CO2 24 23  BUN 25* 18  CREATININE 1.31* 1.23*  GLUCOSE 103* 100*  CALCIUM 8.9 8.2*   No results for input(s): "LABPT", "INR" in the last 72 hours.  Sensation intact distally Intact pulses distally Dorsiflexion/Plantar flexion intact Incision: dressing C/D/I   Assessment/Plan: 1 Day Post-Op Procedure(s) (LRB): LEFT HEMI HIP ARTHROPLASTY ANTERIOR APPROACH (Left) Up with therapy -weightbearing as tolerated left hip She will work with therapy so determination can be made about her postacute hospital placement in terms of short-term skilled nursing versus home.  Most likely she will need short-term skilled nursing.      Mcarthur Rossetti 11/04/2021, 7:45 AM

## 2021-11-05 ENCOUNTER — Encounter (HOSPITAL_COMMUNITY): Payer: Self-pay | Admitting: Orthopaedic Surgery

## 2021-11-05 LAB — CBC
HCT: 25.2 % — ABNORMAL LOW (ref 36.0–46.0)
Hemoglobin: 8.6 g/dL — ABNORMAL LOW (ref 12.0–15.0)
MCH: 29.3 pg (ref 26.0–34.0)
MCHC: 34.1 g/dL (ref 30.0–36.0)
MCV: 85.7 fL (ref 80.0–100.0)
Platelets: 127 10*3/uL — ABNORMAL LOW (ref 150–400)
RBC: 2.94 MIL/uL — ABNORMAL LOW (ref 3.87–5.11)
RDW: 14 % (ref 11.5–15.5)
WBC: 7.9 10*3/uL (ref 4.0–10.5)
nRBC: 0 % (ref 0.0–0.2)

## 2021-11-05 LAB — GLUCOSE, CAPILLARY: Glucose-Capillary: 131 mg/dL — ABNORMAL HIGH (ref 70–99)

## 2021-11-05 MED ORDER — PROPYLENE GLYCOL 0.6 % OP SOLN
1.0000 [drp] | Freq: Two times a day (BID) | OPHTHALMIC | Status: DC
  Administered 2021-11-05: 1 [drp] via OPHTHALMIC

## 2021-11-05 MED ORDER — POLYVINYL ALCOHOL 1.4 % OP SOLN
1.0000 [drp] | OPHTHALMIC | Status: DC | PRN
  Filled 2021-11-05: qty 15

## 2021-11-05 MED ORDER — BOOST / RESOURCE BREEZE PO LIQD CUSTOM
1.0000 | Freq: Three times a day (TID) | ORAL | Status: DC
  Administered 2021-11-05 – 2021-11-06 (×2): 1 via ORAL

## 2021-11-05 MED ORDER — NAPHAZOLINE-GLYCERIN 0.012-0.25 % OP SOLN: 1.0000 [drp] | Freq: Four times a day (QID) | OPHTHALMIC | Status: DC | PRN

## 2021-11-05 NOTE — Care Management Important Message (Signed)
Important Message  Patient Details  Name: Whitney Henderson MRN: [id number] Date of Birth: May 12, 1928   Medicare Important Message Given:  Yes     [name] Montine Circle 11/05/2021, 3:46 PM

## 2021-11-05 NOTE — Progress Notes (Signed)
PROGRESS NOTE  Whitney Henderson HLK:[id number] DOB: 1928-06-30 DOA: 11/02/2021 PCP: [name], MD   LOS: 3 days   Brief Narrative / Interim history: 88 year old female with HTN, CKD 3B, memory issues comes into the hospital with a fall.  Imaging in the ED revealed a displaced fracture of the neck of the left femur.  Orthopedic surgery consulted, she was taken to the OR on 10/14 and is status post left direct anterior hip bipolar hemiarthroplasty.  PT recommends SNF  Subjective / 24h Interval events: Mildly confused, no significant complaints.  Assesement and Plan: Principal Problem:   Closed fracture of neck of left femur ([facility]) Active Problems:   Fall at home, initial encounter   Hypertension, essential, benign   Normocytic anemia   Chronic renal disease, stage III ([place])   Memory disorder   Hip fracture requiring operative repair [facility])  Principal problem Left femoral neck fracture due to fall-status post operative repair by orthopedic surgery, she had left direct anterior hip bipolar hemiarthroplasty on 10/14.  Continue pain control, DVT prophylaxis per Ortho.  PT recommends SNF  Active problems Essential hypertension-continue amlodipine, blood pressure acceptable  Acute blood loss anemia-postoperatively, hemoglobin overall stable in the 8 range today.  No bleeding.  Monitor  Thrombocytopenia-likely consumptive.  Monitor  Mild memory impairment-continue delirium precautions.  Continue home Zoloft  Scheduled Meds:  amLODipine  2.5 mg Oral Daily   ascorbic acid  500 mg Oral QODAY   aspirin  81 mg Oral BID   docusate sodium  100 mg Oral BID   enoxaparin (LOVENOX) injection  30 mg Subcutaneous Q24H   feeding supplement  237 mL Oral BID BM   senna  1 tablet Oral BID   sertraline  25 mg Oral QHS   thiamine  100 mg Oral Daily   Continuous Infusions:  sodium chloride Stopped (11/04/21 0530)   methocarbamol (ROBAXIN) IV     PRN Meds:.acetaminophen, diphenhydrAMINE,  fentaNYL (SUBLIMAZE) injection, hydrALAZINE, HYDROcodone-acetaminophen, HYDROcodone-acetaminophen, menthol-cetylpyridinium **OR** phenol, methocarbamol **OR** methocarbamol (ROBAXIN) IV, metoCLOPramide **OR** metoCLOPramide (REGLAN) injection, morphine injection, ondansetron **OR** ondansetron (ZOFRAN) IV, traMADol  Current Outpatient Medications  Medication Instructions   acetaminophen (TYLENOL) 500 mg, Oral, Every 6 hours PRN   amLODipine (NORVASC) 2.5 MG tablet TAKE 1 TABLET(2.5 MG) BY MOUTH TWICE DAILY   Apoaequorin (PREVAGEN PO) 1 capsule, Oral, Every other day   ascorbic acid (VITAMIN C) 500 mg, Oral, Every other day   betamethasone dipropionate (DIPROLENE) 0.05 % cream APPLY THIN LAYER EXTERNALLY TO THE AFFECTED AREA EVERY DAY AS NEEDED   Calcium Carb-Cholecalciferol (CALCIUM 500 +D PO) 1 tablet, Oral, Every other day   Multiple Vitamins-Minerals (CENTRUM SILVER PO) 1 tablet, Oral, Every other day   Propylene Glycol (SYSTANE BALANCE OP) 1 drop, Both Eyes, Daily PRN   sertraline (ZOLOFT) 25 MG tablet TAKE 1 TABLET(25 MG) BY MOUTH DAILY   thiamine (VITAMIN B1) 100 mg, Oral, Daily   triamcinolone cream (KENALOG) 0.1 % APPLY TO AFFECTED AREA TWICE DAILY AS NEEDED    Diet Orders (From admission, onward)     Start     Ordered   11/03/21 1218  Diet regular Room service appropriate? Yes; Fluid consistency: Thin  Diet effective now       Question Answer Comment  Room service appropriate? Yes   Fluid consistency: Thin      11/03/21 1217            DVT prophylaxis: enoxaparin (LOVENOX) injection 30 mg Start: 11/03/21 2200 SCDs Start: 11/03/21 1218  Lab Results  Component Value Date   PLT 127 (L) 11/05/2021      Code Status: Full Code  Family Communication: No family at bedside  Status is: Inpatient Remains inpatient appropriate because: Monitor urine cultures, hemoglobin, SNF planned   Level of care: Telemetry Surgical  Consultants:  Orthopedic  surgery  Objective: Vitals:   11/04/21 1637 11/04/21 1932 11/05/21 0419 11/05/21 0826  BP: (!) 134/51 (!) 144/47 (!) 141/60 (!) 131/54  Pulse: 77   100  Resp: '18 20 16 18  '$ Temp: 99.9 F (37.7 C) 99 F (37.2 C) 99 F (37.2 C) 98.9 F (37.2 C)  TempSrc: Oral Oral Oral   SpO2: 100% 100% 100% 100%  Weight:      Height:       No intake or output data in the 24 hours ending 11/05/21 1020 Wt Readings from Last 3 Encounters:  11/03/21 54.4 kg  07/19/21 50.8 kg  07/19/21 50.8 kg    Examination:  Constitutional: NAD Eyes: no scleral icterus ENMT: Mucous membranes are moist.  Neck: normal, supple Respiratory: clear to auscultation bilaterally, no wheezing, no crackles. Normal respiratory effort. No accessory muscle use.  Cardiovascular: Regular rate and rhythm, no murmurs / rubs / gallops. No LE edema.  Abdomen: non distended, no tenderness. Bowel sounds positive.  Musculoskeletal: no clubbing / cyanosis.    Data Reviewed: I have independently reviewed following labs and imaging studies   CBC Recent Labs  Lab 11/02/21 1533 11/03/21 0306 11/04/21 0456 11/05/21 0259  WBC 6.2 5.0 8.0 7.9  HGB 11.5* 10.1* 9.0* 8.6*  HCT 34.0* 30.3* 26.6* 25.2*  PLT 146* 131* 131* 127*  MCV 87.0 87.8 86.6 85.7  MCH 29.4 29.3 29.3 29.3  MCHC 33.8 33.3 33.8 34.1  RDW 14.1 14.2 14.0 14.0  LYMPHSABS 0.8  --  0.6*  --   MONOABS 0.5  --  0.9  --   EOSABS 0.0  --  0.0  --   BASOSABS 0.0  --  0.0  --     Recent Labs  Lab 11/02/21 1533 11/03/21 0306 11/04/21 0456  NA 138 137 136  K 4.0 3.7 3.7  CL 106 104 106  CO2 21* 24 23  GLUCOSE 109* 103* 100*  BUN 23 25* 18  CREATININE 1.23* 1.31* 1.23*  CALCIUM 9.2 8.9 8.2*  ALBUMIN  --   --  2.6*    ------------------------------------------------------------------------------------------------------------------ No results for input(s): "CHOL", "HDL", "LDLCALC", "TRIG", "CHOLHDL", "LDLDIRECT" in the last 72 hours.  Lab Results  Component  Value Date   HGBA1C 5.6 04/16/2021   ------------------------------------------------------------------------------------------------------------------ No results for input(s): "TSH", "T4TOTAL", "T3FREE", "THYROIDAB" in the last 72 hours.  Invalid input(s): "FREET3"  Cardiac Enzymes No results for input(s): "CKMB", "TROPONINI", "MYOGLOBIN" in the last 168 hours.  Invalid input(s): "CK" ------------------------------------------------------------------------------------------------------------------ No results found for: "BNP"  CBG: No results for input(s): "GLUCAP" in the last 168 hours.  Recent Results (from the past 240 hour(s))  Surgical pcr screen     Status: None   Collection Time: 11/03/21  9:03 AM   Specimen: Nasal Mucosa; Nasal Swab  Result Value Ref Range Status   MRSA, PCR NEGATIVE NEGATIVE Final   Staphylococcus aureus NEGATIVE NEGATIVE Final    Comment: (NOTE) The Xpert SA Assay (FDA approved for NASAL specimens in patients 61 years of age and older), is one component of a comprehensive surveillance program. It is not intended to diagnose infection nor to guide or monitor treatment. Performed at [facility],  [address] [place], [place] [address]      Radiology Studies: No results found.   Marzetta Board, MD, PhD [facility]  Between 7 am - 7 pm I am available, please contact me via Amion (for emergencies) or Securechat (non urgent messages)  Between 7 pm - 7 am I am not available, please contact night coverage MD/APP via Amion

## 2021-11-05 NOTE — Progress Notes (Signed)
RE:  Whitney Henderson       Date of Birth: 2028/10/07      Date:  11/05/21        To Whom It May Concern:  Please be advised that the above-named patient will require a short-term nursing home stay - anticipated 30 days or less for rehabilitation and strengthening.  The plan is for return home.                 MD signature                Date

## 2021-11-05 NOTE — Evaluation (Signed)
Occupational Therapy Evaluation Patient Details Name: Whitney Henderson MRN: [id number] DOB: Dec 27, 1928 Today's Date: 11/05/2021   History of Present Illness The pt is a 88 yo female presenting 10/13 after a fall resulting in L hip fx. Now s/p direct anterior bipolar hemiarthroplasty of L hip on 10/14. PMH includes: dementia, anxiety, HTN, and CKD III.   Clinical Impression   Pt was living alone with intermittent assist of her daughter, walking with a cane and independent in self care and light meal prep and housekeeping. Pt presents with impaired cognition, disoriented to place, time and situation. She requires +2 assist for all mobility and use of stedy to transfer OOB to chair. Pt requires min to total assist for ADLs. Recommending SNF for further rehab. Will follow acutely.      Recommendations for follow up therapy are one component of a multi-disciplinary discharge planning process, led by the attending physician.  Recommendations may be updated based on patient status, additional functional criteria and insurance authorization.   Follow Up Recommendations  Skilled nursing-short term rehab (<3 hours/day)    Assistance Recommended at Discharge Frequent or constant Supervision/Assistance  Patient can return home with the following Two people to help with walking and/or transfers;Two people to help with bathing/dressing/bathroom;Assistance with feeding;Assistance with cooking/housework;Direct supervision/assist for medications management;Direct supervision/assist for financial management;Assist for transportation;Help with stairs or ramp for entrance    Functional Status Assessment  Patient has had a recent decline in their functional status and demonstrates the ability to make significant improvements in function in a reasonable and predictable amount of time.  Equipment Recommendations  Other (comment) (defer to next venue)    Recommendations for Other Services       Precautions /  Restrictions Precautions Precautions: Fall Restrictions Weight Bearing Restrictions: Yes LLE Weight Bearing: Weight bearing as tolerated      Mobility Bed Mobility Overal bed mobility: Needs Assistance Bed Mobility: Supine to Sit     Supine to sit: +2 for physical assistance, Max assist     General bed mobility comments: pivoted hips/LEs to EOB and assisted to raise trunk    Transfers Overall transfer level: Needs assistance Equipment used: Ambulation equipment used Transfers: Sit to/from Stand Sit to Stand: +2 physical assistance, Mod assist           General transfer comment: assist to rise and steady, multimodal cues to tuck her hips for placement of stedy platforms, +2 min assist to rise from platforms and lower into chair      Balance Overall balance assessment: Needs assistance Sitting-balance support: Feet supported Sitting balance-Leahy Scale: Fair     Standing balance support: Bilateral upper extremity supported Standing balance-Leahy Scale: Poor Standing balance comment: dependent on B UEs and external support                           ADL either performed or assessed with clinical judgement   ADL Overall ADL's : Needs assistance/impaired Eating/Feeding: Minimal assistance;Sitting   Grooming: Minimal assistance;Sitting;Oral care;Wash/dry hands;Wash/dry face   Upper Body Bathing: Moderate assistance;Sitting   Lower Body Bathing: +2 for physical assistance;Total assistance;Sit to/from stand   Upper Body Dressing : Minimal assistance;Sitting   Lower Body Dressing: +2 for physical assistance;Total assistance;Sit to/from stand   Toilet Transfer: +2 for physical assistance;Total assistance Toilet Transfer Details (indicate cue type and reason): simulated bed to chair with stedy Toileting- Clothing Manipulation and Hygiene: +2 for safety/equipment;Total assistance;Sit to/from stand  Vision Ability to See in Adequate  Light: 0 Adequate Patient Visual Report: No change from baseline       Perception     Praxis      Pertinent Vitals/Pain Pain Assessment Pain Assessment: Faces Faces Pain Scale: Hurts even more Pain Location: L LE with mobility Pain Descriptors / Indicators: Grimacing, Guarding, Discomfort Pain Intervention(s): Monitored during session, Repositioned, Patient requesting pain meds-RN notified     Hand Dominance Right   Extremity/Trunk Assessment Upper Extremity Assessment Upper Extremity Assessment: Generalized weakness;Overall [facility] for tasks assessed   Lower Extremity Assessment Lower Extremity Assessment: Defer to PT evaluation   Cervical / Trunk Assessment Cervical / Trunk Assessment: Kyphotic   Communication Communication Communication: No difficulties   Cognition Arousal/Alertness: Awake/alert Behavior During Therapy: WFL for tasks assessed/performed Overall Cognitive Status: Impaired/Different from baseline Area of Impairment: Orientation, Memory, Following commands, Safety/judgement, Awareness, Problem solving                 Orientation Level: Disoriented to, Situation, Time, Place   Memory: Decreased short-term memory Following Commands: Follows one step commands with increased time Safety/Judgement: Decreased awareness of deficits Awareness: Intellectual Problem Solving: Slow processing, Decreased initiation, Difficulty sequencing, Requires verbal cues       General Comments       Exercises     Shoulder Instructions      Home Living Family/patient expects to be discharged to:: Private residence Living Arrangements: Alone Available Help at Discharge: Family;Available PRN/intermittently Type of Home: House Home Access: Stairs to enter [employer] of Steps: 1 at back, 2 at front with rail Entrance Stairs-Rails: Left;Right Home Layout: One level     Bathroom Shower/Tub: Occupational psychologist: Handicapped height      Home Equipment: Cane - single point;Shower seat;Grab bars - tub/shower;Grab bars - toilet;Rolling Walker (2 wheels)          Prior Functioning/Environment Prior Level of Function : Independent/Modified Independent             Mobility Comments: walking with SPC, no other falls in last 6 months ADLs Comments: independent but not driving        OT Problem List: Decreased strength;Decreased activity tolerance;Impaired balance (sitting and/or standing);Decreased cognition;Decreased knowledge of use of DME or AE;Pain      OT Treatment/Interventions: Self-care/ADL training;DME and/or AE instruction;Therapeutic activities;Cognitive remediation/compensation;Patient/family education;Balance training    OT Goals(Current goals can be found in the care plan section) Acute Rehab OT Goals OT Goal Formulation: With patient Time For Goal Achievement: 11/19/21 Potential to Achieve Goals: Good ADL Goals Pt Will Perform Grooming: with min assist;standing Pt Will Perform Upper Body Bathing: with min assist;sitting Pt Will Perform Upper Body Dressing: with supervision;sitting Pt Will Transfer to Toilet: with mod assist;stand pivot transfer;bedside commode Pt Will Perform Toileting - Clothing Manipulation and hygiene: sitting/lateral leans;with supervision Additional ADL Goal #1: Pt will perform bed mobility with moderate assistance in preparation for ADLs.  OT Frequency: Min 2X/week    Co-evaluation PT/OT/SLP Co-Evaluation/Treatment: Yes Reason for Co-Treatment: For patient/therapist safety   OT goals addressed during session: ADL's and self-care      AM-PAC OT "6 Clicks" Daily Activity     Outcome Measure Help from another person eating meals?: A Little Help from another person taking care of personal grooming?: A Little Help from another person toileting, which includes using toliet, bedpan, or urinal?: Total Help from another person bathing (including washing, rinsing, drying)?: A  Lot Help from another person to put  on and taking off regular upper body clothing?: A Little Help from another person to put on and taking off regular lower body clothing?: Total 6 Click Score: 13   End of Session Equipment Utilized During Treatment: Gait belt Nurse Communication: Mobility status;Need for lift equipment;Patient requests pain meds  Activity Tolerance: Patient tolerated treatment well Patient left: in chair;with chair alarm set;with call bell/phone within reach  OT Visit Diagnosis: Muscle weakness (generalized) (M62.81);Pain;Other symptoms and signs involving cognitive function Pain - Right/Left: Left Pain - part of body: Leg                Time: 3361-2244 OT Time Calculation (min): 33 min Charges:  OT General Charges $OT Visit: 1 Visit OT Evaluation $OT Eval Moderate Complexity: [place], OTR/L Acute Rehabilitation Services Office: [phone]  Malka So 11/05/2021, 10:54 AM

## 2021-11-05 NOTE — NC FL2 (Signed)
[place] MEDICAID FL2 LEVEL OF CARE SCREENING TOOL     IDENTIFICATION  Patient Name: Whitney Henderson Birthdate: [birth year] Sex: female Admission Date (Current Location): 11/02/2021  [facility] and [place] Number:  Herbalist and Address:  [facility] [place], [place] [address], [place], [place] [address]      Provider Number: [id number]  Attending Physician Name and Address:  Caren Griffins, MD  Relative Name and Phone Number:  Zabria, Liss Daughter [phone]  [phone]    Current Level of Care: Hospital Recommended Level of Care: [place] Prior Approval Number:    Date Approved/Denied:   PASRR Number:    Discharge Plan: SNF    Current Diagnoses: Patient Active Problem List   Diagnosis Date Noted   Hip fracture requiring operative repair ([place]) 11/03/2021   Closed fracture of neck of left femur ([place]) 11/02/2021   Fall at home, initial encounter 11/02/2021   Normocytic anemia 11/02/2021   Tremor 01/06/2020   Age-related osteoporosis with current pathological fracture 11/05/2018   Grief reaction 03/31/2018   Hypertensive nephropathy 03/03/2018   Chronic renal disease, stage III ([place]) 12/11/2017   Hypertension, essential, benign 10/30/2017   Anxiety 10/11/2017   Memory disorder 09/04/2016   Headache 04/27/2015   Hypertension 01/16/2013   UTI (lower urinary tract infection) 01/16/2013    Orientation RESPIRATION BLADDER Height & Weight     Self, Situation, Place  Normal Continent Weight: 120 lb (54.4 kg) Height:  5' 2.01" (157.5 cm)  BEHAVIORAL SYMPTOMS/MOOD NEUROLOGICAL BOWEL NUTRITION STATUS      Continent Diet (see discharge summary)  AMBULATORY STATUS COMMUNICATION OF NEEDS Skin   Total Care Verbally Surgical wounds                       Personal Care Assistance Level of Assistance  Bathing, Feeding, Dressing, Total care Bathing Assistance: Maximum assistance Feeding assistance: Limited  assistance Dressing Assistance: Maximum assistance Total Care Assistance: Maximum assistance   Functional Limitations Info  Sight, Hearing, Speech Sight Info: Adequate Hearing Info: Adequate Speech Info: Adequate    SPECIAL CARE FACTORS FREQUENCY  PT (By licensed PT), OT (By licensed OT)     PT Frequency: 5x week OT Frequency: 5x week            Contractures Contractures Info: Not present    Additional Factors Info  Code Status, Allergies Code Status Info: full Allergies Info: Fish Allergy, Codeine           Current Medications (11/05/2021):  This is the current hospital active medication list Current Facility-Administered Medications  Medication Dose Route Frequency Provider Last Rate Last Admin   0.9 %  sodium chloride infusion   Intravenous Continuous Mcarthur Rossetti, MD   Stopped at 11/04/21 0530   acetaminophen (TYLENOL) tablet 325-650 mg  325-650 mg Oral Q6H PRN Mcarthur Rossetti, MD   650 mg at 11/05/21 1051   amLODipine (NORVASC) tablet 2.5 mg  2.5 mg Oral Daily Mcarthur Rossetti, MD   2.5 mg at 11/05/21 1134   ascorbic acid (VITAMIN C) tablet 500 mg  500 mg Oral Angelica Chessman, MD   500 mg at 11/05/21 1133   aspirin chewable tablet 81 mg  81 mg Oral BID Mcarthur Rossetti, MD   81 mg at 11/05/21 1133   diphenhydrAMINE (BENADRYL) 12.5 MG/5ML elixir 12.5-25 mg  12.5-25 mg Oral Q4H PRN Mcarthur Rossetti, MD  docusate sodium (COLACE) capsule 100 mg  100 mg Oral BID Mcarthur Rossetti, MD   100 mg at 11/05/21 1134   enoxaparin (LOVENOX) injection 30 mg  30 mg Subcutaneous Q24H Henri Medal, RPH   30 mg at 11/04/21 2036   feeding supplement (ENSURE ENLIVE / ENSURE PLUS) liquid 237 mL  237 mL Oral BID BM Mcarthur Rossetti, MD   237 mL at 11/04/21 1451   fentaNYL (SUBLIMAZE) injection 25 mcg  25 mcg Intravenous Q2H PRN Mcarthur Rossetti, MD       hydrALAZINE (APRESOLINE) injection 5 mg  5 mg Intravenous  Q4H PRN Mcarthur Rossetti, MD       HYDROcodone-acetaminophen (NORCO) 7.5-325 MG per tablet 1-2 tablet  1-2 tablet Oral Q4H PRN Mcarthur Rossetti, MD   2 tablet at 11/04/21 2035   HYDROcodone-acetaminophen (NORCO/VICODIN) 5-325 MG per tablet 1-2 tablet  1-2 tablet Oral Q4H PRN Mcarthur Rossetti, MD       menthol-cetylpyridinium (CEPACOL) lozenge 3 mg  1 lozenge Oral PRN Mcarthur Rossetti, MD       Or   phenol (CHLORASEPTIC) mouth spray 1 spray  1 spray Mouth/Throat PRN Mcarthur Rossetti, MD       methocarbamol (ROBAXIN) tablet 500 mg  500 mg Oral Q6H PRN Mcarthur Rossetti, MD   500 mg at 11/04/21 2036   Or   methocarbamol (ROBAXIN) 500 mg in dextrose 5 % 50 mL IVPB  500 mg Intravenous Q6H PRN Mcarthur Rossetti, MD       metoCLOPramide (REGLAN) tablet 5-10 mg  5-10 mg Oral Q8H PRN Mcarthur Rossetti, MD       Or   metoCLOPramide (REGLAN) injection 5-10 mg  5-10 mg Intravenous Q8H PRN Mcarthur Rossetti, MD       morphine (PF) 2 MG/ML injection 0.5-1 mg  0.5-1 mg Intravenous Q2H PRN Mcarthur Rossetti, MD       ondansetron [facility]) tablet 4 mg  4 mg Oral Q6H PRN Mcarthur Rossetti, MD       Or   ondansetron [facility]) injection 4 mg  4 mg Intravenous Q6H PRN Mcarthur Rossetti, MD       senna [facility]) tablet 8.6 mg  1 tablet Oral BID Mcarthur Rossetti, MD   8.6 mg at 11/05/21 1134   sertraline (ZOLOFT) tablet 25 mg  25 mg Oral QHS Mcarthur Rossetti, MD   25 mg at 11/04/21 2036   thiamine (VITAMIN B1) tablet 100 mg  100 mg Oral Daily Mcarthur Rossetti, MD   100 mg at 11/05/21 1134   traMADol (ULTRAM) tablet 50-100 mg  50-100 mg Oral Q12H PRN Mcarthur Rossetti, MD         Discharge Medications: Please see discharge summary for a list of discharge medications.  Relevant Imaging Results:  Relevant Lab Results:   Additional Information SSN: [id number]. Pt reports she is vaccinated for covid with  multiple boosters.  Joanne Chars, LCSW

## 2021-11-05 NOTE — Progress Notes (Signed)
Subjective: 2 Days Post-Op Procedure(s) (LRB): LEFT HEMI HIP ARTHROPLASTY ANTERIOR APPROACH (Left) Patient reports pain as mild.  Sitting up in chair appears comfortable. Denies chest pain or dizziness.   Objective: Vital signs in last 24 hours: Temp:  [98.9 F (37.2 C)-99.9 F (37.7 C)] 98.9 F (37.2 C) (10/16 0826) Pulse Rate:  [77-100] 100 (10/16 0826) Resp:  [16-20] 18 (10/16 0826) BP: (131-144)/(47-60) 131/54 (10/16 0826) SpO2:  [100 %] 100 % (10/16 0826)  Intake/Output from previous day: No intake/output data recorded. Intake/Output this shift: No intake/output data recorded.  Recent Labs    11/02/21 1533 11/03/21 0306 11/04/21 0456 11/05/21 0259  HGB 11.5* 10.1* 9.0* 8.6*   Recent Labs    11/04/21 0456 11/05/21 0259  WBC 8.0 7.9  RBC 3.07* 2.94*  HCT 26.6* 25.2*  PLT 131* 127*   Recent Labs    11/03/21 0306 11/04/21 0456  NA 137 136  K 3.7 3.7  CL 104 106  CO2 24 23  BUN 25* 18  CREATININE 1.31* 1.23*  GLUCOSE 103* 100*  CALCIUM 8.9 8.2*   No results for input(s): "LABPT", "INR" in the last 72 hours.  Sensation intact distally Dorsiflexion/Plantar flexion intact Incision: dressing C/D/I Compartment soft   Assessment/Plan: 2 Days Post-Op Procedure(s) (LRB): LEFT HEMI HIP ARTHROPLASTY ANTERIOR APPROACH (Left) Up with therapy Weight bearing as tolerated.       Roxi Hlavaty 11/05/2021, 12:26 PM

## 2021-11-05 NOTE — TOC Initial Note (Addendum)
Transition of Care [facility]) - Initial/Assessment Note    Patient Details  Name: Whitney Henderson MRN: [id number] Date of Birth: September 14, 1928  [facility] CM/SW Contact:    Joanne Chars, LCSW Phone Number: 11/05/2021, 1:21 PM  Clinical Narrative:     CSW  spoke with pt regarding DC recommendation for SNF.  Pt has variable reports of her level of orientation, was able to participate appropriately in assessment.  Permission given to speak with daughter [name], son Izell Henry, who goes by [employer]".  Pt reports she lives alone, no services.  She is willing to go to SNF.  CSW LM with daughter [name].  1200: TC [name], she confirms the above, also in agreement with plan for SNF, would like [facility] if possible.  She is now at the hospital.    Referral sent out in hub, CSW reached out to [facility], who does make bed offer.  1330: CSW spoke with pt and daughter in room, choice document provided, they do accept offer at [facility].  Auth request   submitted in [place] and approved: [id number], 3 days: 10/17-10/19.            Expected Discharge Plan: Skilled Nursing Facility Barriers to Discharge: Continued Medical Work up, SNF Pending bed offer   Patient Goals and CMS Choice Patient states their goals for this hospitalization and ongoing recovery are:: function normally CMS Medicare.gov Compare Post Acute Care list provided to:: Patient Choice offered to / list presented to : Patient  Expected Discharge Plan and Services Expected Discharge Plan: [place] In-house Referral: Clinical Social Work   Post Acute Care Choice: [place] Living arrangements for the past 2 months: [place]                                      Prior Living Arrangements/Services Living arrangements for the past 2 months: Single Family Home Lives with:: Self Patient language and need for interpreter reviewed:: Yes Do you feel safe going back to the  place where you live?: Yes      Need for Family Participation in Patient Care: Yes (Comment) Care giver support system in place?: Yes (comment) Current home services: Other (comment) (none) Criminal Activity/Legal Involvement Pertinent to Current Situation/Hospitalization: No - Comment as needed  Activities of Daily Living      Permission Sought/Granted Permission sought to share information with : Family Supports Permission granted to share information with : Yes, Verbal Permission Granted  Share Information with NAME: daughter [name], son Izell [place] "Cabin crew"  Permission granted to share info w AGENCY: SNF        Emotional Assessment Appearance:: Appears stated age Attitude/Demeanor/Rapport: Engaged Affect (typically observed): Appropriate, Pleasant Orientation: : Oriented to Self, Oriented to Place, Oriented to Situation Alcohol / Substance Use: Not Applicable Psych Involvement: No (comment)  Admission diagnosis:  Closed left hip fracture ([place]) [S72.002A] Fall, initial encounter [W19.XXXA] Closed fracture of neck of left femur, initial encounter ([place]) [S72.002A] Hip fracture requiring operative repair [facility]) [S72.009A] Patient Active Problem List   Diagnosis Date Noted   Hip fracture requiring operative repair ([place]) 11/03/2021   Closed fracture of neck of left femur ([place]) 11/02/2021   Fall at home, initial encounter 11/02/2021   Normocytic anemia 11/02/2021   Tremor 01/06/2020   Age-related osteoporosis with current pathological fracture 11/05/2018   Grief reaction 03/31/2018   Hypertensive nephropathy 03/03/2018  Chronic renal disease, stage III ([place]) 12/11/2017   Hypertension, essential, benign 10/30/2017   Anxiety 10/11/2017   Memory disorder 09/04/2016   Headache 04/27/2015   Hypertension 01/16/2013   UTI (lower urinary tract infection) 01/16/2013   PCP:  [name], MD Pharmacy:   [facility] [place], [place] [address] Phone: [phone] Fax: [phone]     Social Determinants of Health (SDOH) Interventions    Readmission Risk Interventions     No data to display

## 2021-11-05 NOTE — Progress Notes (Signed)
Physical Therapy Treatment Patient Details Name: Whitney Henderson MRN: [id number] DOB: October 03, 1928 Today's Date: 11/05/2021   History of Present Illness The pt is a 88 yo female presenting 10/13 after a fall resulting in L hip fx. Now s/p direct anterior bipolar hemiarthroplasty of L hip on 10/14. PMH includes: dementia, anxiety, HTN, and CKD III.    PT Comments    Patient progressing well towards PT goals. Continues to be confused about where she is and why she is here. Follows commands well and clapping for herself after performing a task. Requires Max A of 2 for bed mobility and Mod-Min A of 2 for standing using stedy frame. Tolerated there ex of LLE. Recommend use of stedy to transfer pt back to bed with assist of 2 for safety. Continues to be appropriate for [facility]. Will follow.    Recommendations for follow up therapy are one component of a multi-disciplinary discharge planning process, led by the attending physician.  Recommendations may be updated based on patient status, additional functional criteria and insurance authorization.  Follow Up Recommendations  Skilled nursing-short term rehab (<3 hours/day) Can patient physically be transported by private vehicle: No   Assistance Recommended at Discharge Frequent or constant Supervision/Assistance  Patient can return home with the following A lot of help with walking and/or transfers;A lot of help with bathing/dressing/bathroom;Direct supervision/assist for medications management;Assistance with cooking/housework;Assistance with feeding;Direct supervision/assist for financial management;Assist for transportation;Help with stairs or ramp for entrance   Equipment Recommendations  Rolling walker (2 wheels);BSC/3in1    Recommendations for Other Services       Precautions / Restrictions Precautions Precautions: Fall Restrictions Weight Bearing Restrictions: Yes LLE Weight Bearing: Weight bearing as tolerated     Mobility  Bed  Mobility Overal bed mobility: Needs Assistance Bed Mobility: Supine to Sit     Supine to sit: +2 for physical assistance, Max assist     General bed mobility comments: pivoted hips/LEs to EOB and assisted to raise trunk    Transfers Overall transfer level: Needs assistance Equipment used: Ambulation equipment used Transfers: Sit to/from Stand Sit to Stand: +2 physical assistance, Mod assist           General transfer comment: assist to rise and steady, multimodal cues to tuck her hips for placement of stedy platforms, +2 min assist to rise from platforms and lower into chair.    Ambulation/Gait               General Gait Details: unable at this time   Stairs             Wheelchair Mobility    Modified Rankin (Stroke Patients Only)       Balance Overall balance assessment: Needs assistance Sitting-balance support: Feet supported, No upper extremity supported Sitting balance-Leahy Scale: Fair     Standing balance support: During functional activity, Bilateral upper extremity supported Standing balance-Leahy Scale: Poor Standing balance comment: dependent on B UEs and external support                            Cognition Arousal/Alertness: Awake/alert Behavior During Therapy: WFL for tasks assessed/performed Overall Cognitive Status: Impaired/Different from baseline Area of Impairment: Orientation, Memory, Following commands, Safety/judgement, Awareness, Problem solving                 Orientation Level: Disoriented to, Situation, Time, Place   Memory: Decreased short-term memory Following Commands: Follows one step commands with increased time  Safety/Judgement: Decreased awareness of deficits Awareness: Intellectual Problem Solving: Slow processing, Decreased initiation, Difficulty sequencing, Requires verbal cues General Comments: " i thought i would have time for questions like before all this." Asking where she is and what  has happened on a few occasions.        Exercises Total Joint Exercises Ankle Circles/Pumps: AROM, Both, 10 reps, Supine Quad Sets: AROM, Both, 10 reps, Seated Hip ABduction/ADduction: AAROM, Left, 5 reps, Seated Long Arc Quad: AAROM, Left, 5 reps, Seated    General Comments        Pertinent Vitals/Pain Pain Assessment Pain Assessment: Faces Faces Pain Scale: Hurts even more Pain Location: L LE with mobility Pain Descriptors / Indicators: Grimacing, Guarding, Discomfort Pain Intervention(s): Monitored during session, Limited activity within patient's tolerance, Repositioned, Patient requesting pain meds-RN notified    Home Living Family/patient expects to be discharged to:: Private residence Living Arrangements: Alone Available Help at Discharge: Family;Available PRN/intermittently Type of Home: House Home Access: Stairs to enter Entrance Stairs-Rails: Chemical engineer of Steps: 1 at back, 2 at front with rail   Home Layout: One level Home Equipment: Cane - single point;Shower seat;Grab bars - tub/shower;Grab bars - toilet;Rolling Walker (2 wheels)      Prior Function            PT Goals (current goals can now be found in the care plan section) Progress towards PT goals: Progressing toward goals    Frequency    Min 3X/week      PT Plan Current plan remains appropriate    Co-evaluation PT/OT/SLP Co-Evaluation/Treatment: Yes Reason for Co-Treatment: For patient/therapist safety;To address functional/ADL transfers PT goals addressed during session: Mobility/safety with mobility;Balance;Strengthening/ROM OT goals addressed during session: ADL's and self-care      AM-PAC PT "6 Clicks" Mobility   Outcome Measure  Help needed turning from your back to your side while in a flat bed without using bedrails?: A Lot Help needed moving from lying on your back to sitting on the side of a flat bed without using bedrails?: Total Help needed moving to  and from a bed to a chair (including a wheelchair)?: Total Help needed standing up from a chair using your arms (e.g., wheelchair or bedside chair)?: Total Help needed to walk in hospital room?: Total Help needed climbing 3-5 steps with a railing? : Total 6 Click Score: 7    End of Session Equipment Utilized During Treatment: Gait belt Activity Tolerance: Patient tolerated treatment well Patient left: in chair;with call bell/phone within reach;with chair alarm set Nurse Communication: Mobility status;Need for lift equipment (stedy) PT Visit Diagnosis: Other abnormalities of gait and mobility (R26.89);Muscle weakness (generalized) (M62.81);Pain Pain - Right/Left: Left Pain - part of body: Hip     Time: 7494-4967 PT Time Calculation (min) (ACUTE ONLY): 18 min  Charges:  $Therapeutic Activity: 8-22 mins                     Marisa Severin, PT, DPT Acute Rehabilitation Services Secure chat preferred Office [place] 11/05/2021, 12:27 PM

## 2021-11-05 NOTE — TOC CAGE-AID Note (Signed)
Transition of Care [facility]) - CAGE-AID Screening   Patient Details  Name: Whitney Henderson MRN: [id number] Date of Birth: [birth year]  [facility] CM/SW Contact:    Coralee Pesa, [place] Phone Number: 11/05/2021, 8:52 AM   Clinical Narrative:  Per chart review, pt is disoriented and unable to participate in CAGE- AID assessment.   CAGE-AID Screening: Substance Abuse Screening unable to be completed due to: : Patient unable to participate             Substance Abuse Education Offered: No

## 2021-11-06 DIAGNOSIS — D464 Refractory anemia, unspecified: Secondary | ICD-10-CM | POA: Diagnosis not present

## 2021-11-06 DIAGNOSIS — Z96642 Presence of left artificial hip joint: Secondary | ICD-10-CM | POA: Diagnosis not present

## 2021-11-06 DIAGNOSIS — N183 Chronic kidney disease, stage 3 unspecified: Secondary | ICD-10-CM | POA: Diagnosis not present

## 2021-11-06 DIAGNOSIS — R2689 Other abnormalities of gait and mobility: Secondary | ICD-10-CM | POA: Diagnosis not present

## 2021-11-06 DIAGNOSIS — R251 Tremor, unspecified: Secondary | ICD-10-CM | POA: Diagnosis not present

## 2021-11-06 DIAGNOSIS — S72002A Fracture of unspecified part of neck of left femur, initial encounter for closed fracture: Secondary | ICD-10-CM | POA: Diagnosis not present

## 2021-11-06 DIAGNOSIS — M6281 Muscle weakness (generalized): Secondary | ICD-10-CM | POA: Diagnosis not present

## 2021-11-06 DIAGNOSIS — S72002D Fracture of unspecified part of neck of left femur, subsequent encounter for closed fracture with routine healing: Secondary | ICD-10-CM | POA: Diagnosis not present

## 2021-11-06 DIAGNOSIS — W19XXXA Unspecified fall, initial encounter: Secondary | ICD-10-CM | POA: Diagnosis not present

## 2021-11-06 DIAGNOSIS — R404 Transient alteration of awareness: Secondary | ICD-10-CM | POA: Diagnosis not present

## 2021-11-06 DIAGNOSIS — Z9181 History of falling: Secondary | ICD-10-CM | POA: Diagnosis not present

## 2021-11-06 DIAGNOSIS — I10 Essential (primary) hypertension: Secondary | ICD-10-CM | POA: Diagnosis not present

## 2021-11-06 DIAGNOSIS — R413 Other amnesia: Secondary | ICD-10-CM | POA: Diagnosis not present

## 2021-11-06 DIAGNOSIS — R2681 Unsteadiness on feet: Secondary | ICD-10-CM | POA: Diagnosis not present

## 2021-11-06 DIAGNOSIS — Z4732 Aftercare following explantation of hip joint prosthesis: Secondary | ICD-10-CM | POA: Diagnosis not present

## 2021-11-06 DIAGNOSIS — R296 Repeated falls: Secondary | ICD-10-CM | POA: Diagnosis not present

## 2021-11-06 DIAGNOSIS — Z743 Need for continuous supervision: Secondary | ICD-10-CM | POA: Diagnosis not present

## 2021-11-06 DIAGNOSIS — N390 Urinary tract infection, site not specified: Secondary | ICD-10-CM | POA: Diagnosis not present

## 2021-11-06 DIAGNOSIS — F32A Depression, unspecified: Secondary | ICD-10-CM | POA: Diagnosis not present

## 2021-11-06 DIAGNOSIS — D631 Anemia in chronic kidney disease: Secondary | ICD-10-CM | POA: Diagnosis not present

## 2021-11-06 DIAGNOSIS — R6889 Other general symptoms and signs: Secondary | ICD-10-CM | POA: Diagnosis not present

## 2021-11-06 DIAGNOSIS — I129 Hypertensive chronic kidney disease with stage 1 through stage 4 chronic kidney disease, or unspecified chronic kidney disease: Secondary | ICD-10-CM | POA: Diagnosis not present

## 2021-11-06 DIAGNOSIS — Z7401 Bed confinement status: Secondary | ICD-10-CM | POA: Diagnosis not present

## 2021-11-06 DIAGNOSIS — N1832 Chronic kidney disease, stage 3b: Secondary | ICD-10-CM | POA: Diagnosis not present

## 2021-11-06 LAB — COMPREHENSIVE METABOLIC PANEL
ALT: 10 U/L (ref 0–44)
AST: 33 U/L (ref 15–41)
Albumin: 2.3 g/dL — ABNORMAL LOW (ref 3.5–5.0)
Alkaline Phosphatase: 49 U/L (ref 38–126)
Anion gap: 6 (ref 5–15)
BUN: 31 mg/dL — ABNORMAL HIGH (ref 8–23)
CO2: 23 mmol/L (ref 22–32)
Calcium: 8.1 mg/dL — ABNORMAL LOW (ref 8.9–10.3)
Chloride: 108 mmol/L (ref 98–111)
Creatinine, Ser: 1.19 mg/dL — ABNORMAL HIGH (ref 0.44–1.00)
GFR, Estimated: 43 mL/min — ABNORMAL LOW (ref >60)
Glucose, Bld: 134 mg/dL — ABNORMAL HIGH (ref 70–99)
Potassium: 3.6 mmol/L (ref 3.5–5.1)
Sodium: 137 mmol/L (ref 135–145)
Total Bilirubin: 0.6 mg/dL (ref 0.3–1.2)
Total Protein: 5.7 g/dL — ABNORMAL LOW (ref 6.5–8.1)

## 2021-11-06 LAB — CBC
HCT: 24.6 % — ABNORMAL LOW (ref 36.0–46.0)
Hemoglobin: 8.1 g/dL — ABNORMAL LOW (ref 12.0–15.0)
MCH: 28.3 pg (ref 26.0–34.0)
MCHC: 32.9 g/dL (ref 30.0–36.0)
MCV: 86 fL (ref 80.0–100.0)
Platelets: 154 10*3/uL (ref 150–400)
RBC: 2.86 MIL/uL — ABNORMAL LOW (ref 3.87–5.11)
RDW: 13.8 % (ref 11.5–15.5)
WBC: 7.4 10*3/uL (ref 4.0–10.5)
nRBC: 0 % (ref 0.0–0.2)

## 2021-11-06 LAB — MAGNESIUM: Magnesium: 1.7 mg/dL (ref 1.7–2.4)

## 2021-11-06 MED ORDER — ASPIRIN 81 MG PO CHEW: 81.0000 mg | CHEWABLE_TABLET | Freq: Two times a day (BID) | ORAL | 0 refills | Status: DC

## 2021-11-06 MED ORDER — HYDROCODONE-ACETAMINOPHEN 5-325 MG PO TABS: 1.0000 | ORAL_TABLET | Freq: Four times a day (QID) | ORAL | 0 refills | Status: DC | PRN

## 2021-11-06 NOTE — Progress Notes (Signed)
Report given to staff nurse at  [facility], all questions and concerns were fully addressed.

## 2021-11-06 NOTE — Progress Notes (Signed)
Patient ID: Whitney Henderson, female   DOB: 1928/02/06, 88 y.o.   MRN: [id number] The patient is awake at the bedside.  There is some confusion but she is eating.  Her left hip incision is stable.  I will change the dressing.  She is definitely in need of short-term skilled nursing and I noted the transitional care team is working on finding placement for her.  I did put a prescription for 81 mg aspirin twice a day on the chart as well as hydrocodone for pain.  I put instructions in epic for weightbearing as tolerated and no hip precautions without only working with therapy and assistance.  There is information for the family scheduling a follow-up appoint with [place] in 2 weeks.  We will continue to follow her while she is here.

## 2021-11-06 NOTE — TOC Transition Note (Signed)
Transition of Care [facility]) - CM/SW Discharge Note   Patient Details  Name: Whitney Henderson MRN: [id number] Date of Birth: 17-Dec-1928  [facility] CM/SW Contact:  Joanne Chars, LCSW Phone Number: 11/06/2021, 11:02 AM   Clinical Narrative:   Pt discharging to [facility].  RN call report to [phone].    Final next level of care: Skilled Nursing Facility Barriers to Discharge: Barriers Resolved   Patient Goals and CMS Choice Patient states their goals for this hospitalization and ongoing recovery are:: function normally CMS Medicare.gov Compare Post Acute Care list provided to:: Patient Choice offered to / list presented to : Patient  Discharge Placement              Patient chooses bed at: [facility] Patient to be transferred to facility by: [place] Name of family member notified: daughter [name] in room Patient and family notified of of transfer: 11/06/21  Discharge Plan and Services In-house Referral: Clinical Social Work   Post Acute Care Choice: [place]                               Social Determinants of Health ([place]) Interventions     Readmission Risk Interventions     No data to display

## 2021-11-06 NOTE — TOC Progression Note (Signed)
Transition of Care [facility]) - Progression Note    Patient Details  Name: Whitney Henderson MRN: [id number] Date of Birth: [birth year]  Transition of Care [facility]) CM/SW Contact  Joanne Chars, [place] Phone Number: 11/06/2021, 8:39 AM  Clinical Narrative:   PASSR received: [id number]    Expected Discharge Plan: [place] Barriers to Discharge: Continued Medical Work up, SNF Pending bed offer  Expected Discharge Plan and Services Expected Discharge Plan: [place] In-house Referral: Clinical Social Work   Post Acute Care Choice: [place] Living arrangements for the past 2 months: Single Family Home                                       Social Determinants of Health (SDOH) Interventions    Readmission Risk Interventions     No data to display

## 2021-11-06 NOTE — Progress Notes (Signed)
Discharge summary packet/necessary documents provided to [facility]  Pt/ d/c to [facility] as ordered. Pt remains alert/oriented to self in no apparent distress. Daugther at bedside with no complaints.

## 2021-11-06 NOTE — Discharge Summary (Signed)
Physician Discharge Summary  Whitney Henderson DJM:[id number] DOB: [birth year] DOA: 11/02/2021  PCP: [name], MD  Admit date: 11/02/2021 Discharge date: 11/06/2021  Admitted From: home Disposition:  SNF  Recommendations for Outpatient Follow-up:  Follow up with PCP in 1-2 weeks Please obtain BMP/CBC in one week  Home Health: none Equipment/Devices: none  Discharge Condition: stable CODE STATUS: Full code Diet Orders (From admission, onward)     Start     Ordered   11/03/21 1218  Diet regular Room service appropriate? Yes; Fluid consistency: Thin  Diet effective now       Question Answer Comment  Room service appropriate? Yes   Fluid consistency: Thin      11/03/21 1217            HPI: Per admitting MD, Whitney Henderson is a 88 y.o. female with medical history significant of hypertension, chronic kidney disease stage IIIb, and memory issues presents after fall.  At baseline patient walks around with use of a cane.  She had been coming back inside from outside and was walking into the laundry room when her feet get tingly and then she fell down and then onto her left hip.  She was able to get up with the assistance of her daughter and, but was unable to bear weight on her left leg. Upon admission into the emergency department patient was seen to be mildly tachypneic with blood pressures 147/62 to 179/70, and all other vital signs maintained.  Labs noted hemoglobin 11.5, platelets 146, and creatinine 1.23.  X-rays revealed displaced fracture of the neck of the left femur.    Hospital Course / Discharge diagnoses: Principal Problem:   Closed fracture of neck of left femur [facility]) Active Problems:   Fall at home, initial encounter   Hypertension, essential, benign   Normocytic anemia   Chronic renal disease, stage III (Whitney Henderson)   Memory disorder   Hip fracture requiring operative repair [facility])   Principal problem Left femoral neck fracture due to fall-status post  operative repair by orthopedic surgery, she had left direct anterior hip bipolar hemiarthroplasty on 10/14.  Continue pain control, DVT prophylaxis per Ortho with Aspirin.  PT recommends SNF   Active problems Essential hypertension-continue home regimen  Acute blood loss anemia-postoperatively, hemoglobin overall stable. Did not require transfusion. Please monitor CBC in 3-5 days Thrombocytopenia-likely consumptive.  Normalized Mild memory impairment-Continue home Zoloft  Sepsis ruled out   Discharge Instructions   Allergies as of 11/06/2021       Reactions   Fish Allergy Other (See Comments)   Does not do well with Seafood.   Codeine Rash        Medication List     TAKE these medications    acetaminophen 500 MG tablet Commonly known as: TYLENOL Take 500 mg by mouth every 6 (six) hours as needed for mild pain or moderate pain.   amLODipine 2.5 MG tablet Commonly known as: NORVASC TAKE 1 TABLET(2.5 MG) BY MOUTH TWICE DAILY What changed: See the new instructions.   ascorbic acid 500 MG tablet Commonly known as: VITAMIN C Take 500 mg by mouth every other day.   aspirin 81 MG chewable tablet Chew 1 tablet (81 mg total) by mouth 2 (two) times daily.   betamethasone dipropionate 0.05 % cream APPLY THIN LAYER EXTERNALLY TO THE AFFECTED AREA EVERY DAY AS NEEDED What changed: See the new instructions.   CALCIUM 500 +D PO Take 1 tablet by mouth every other day.  CENTRUM SILVER PO Take 1 tablet by mouth every other day.   HYDROcodone-acetaminophen 5-325 MG tablet Commonly known as: NORCO/VICODIN Take 1 tablet by mouth every 6 (six) hours as needed for moderate pain (pain score 4-6).   PREVAGEN PO Take 1 capsule by mouth every other day.   sertraline 25 MG tablet Commonly known as: ZOLOFT TAKE 1 TABLET(25 MG) BY MOUTH DAILY What changed: See the new instructions.   SYSTANE BALANCE OP Place 1 drop into both eyes daily as needed (for itchy eyes).   thiamine  100 MG tablet Commonly known as: VITAMIN B1 Take 100 mg by mouth daily.   triamcinolone cream 0.1 % Commonly known as: KENALOG APPLY TO AFFECTED AREA TWICE DAILY AS NEEDED What changed:  how much to take how to take this when to take this reasons to take this additional instructions               Durable Medical Equipment  (From admission, onward)           Start     Ordered   11/03/21 1218  DME 3 n 1  Once        11/03/21 1217   11/03/21 1218  DME Walker rolling  Once       Question Answer Comment  Walker: With 5 Inch Wheels   Patient needs a walker to treat with the following condition Status post left hip replacement      11/03/21 1217            Follow-up Information     Mcarthur Rossetti, MD. Schedule an appointment as soon as possible for a visit in 2 week(s).   Specialty: Orthopedic Surgery Contact information: [address] [place] [address] [phone]                 Consultations: Orthopedic surgery   Procedures/Studies:  DG HIP UNILAT WITH PELVIS 2-3 VIEWS LEFT  Result Date: 11/03/2021 CLINICAL DATA:  Left hip surgery EXAM: DG HIP (WITH OR WITHOUT PELVIS) 2-3V LEFT; DG C-ARM 1-60 MIN-NO REPORT COMPARISON:  11/02/2021 FINDINGS: Three C-arm fluoroscopic images were obtained intraoperatively and submitted for post operative interpretation. Images demonstrate left total hip arthroplasty hardware in place. 11 seconds fluoroscopy time utilized. Radiation dose: 0.75 mGy. Please see the performing provider's procedural report for further detail. IMPRESSION: Fluoroscopic guidance for left total hip arthroplasty. Electronically Signed   By: Davina Poke D.O.   On: 11/03/2021 11:48   DG C-Arm 1-60 Min-No Report  Result Date: 11/03/2021 Fluoroscopy was utilized by the requesting physician.  No radiographic interpretation.   DG Chest 2 View  Result Date: 11/02/2021 CLINICAL DATA:  Mediastinal fullness on C-spine CT. EXAM:  CHEST - 2 VIEW COMPARISON:  04/25/2020 FINDINGS: The lungs are clear without focal pneumonia, edema, pneumothorax or pleural effusion. Skin fold noted over lower right lung. The cardiopericardial silhouette is within normal limits for size. Cardiomediastinal contours are stable in the interval. Bones are diffusely demineralized. Telemetry leads overlie the chest. IMPRESSION: No active cardiopulmonary disease. Cardiomediastinal contours are stable since 04/25/2020. Electronically Signed   By: Misty Stanley M.D.   On: 11/02/2021 17:46   CT Head Wo Contrast  Result Date: 11/02/2021 CLINICAL DATA:  Fall EXAM: CT HEAD WITHOUT CONTRAST CT CERVICAL SPINE WITHOUT CONTRAST TECHNIQUE: Multidetector CT imaging of the head and cervical spine was performed following the standard protocol without intravenous contrast. Multiplanar CT image reconstructions of the cervical spine were also generated. RADIATION DOSE REDUCTION: This  exam was performed according to the departmental dose-optimization program which includes automated exposure control, adjustment of the mA and/or kV according to patient size and/or use of iterative reconstruction technique. COMPARISON:  CT head and cervical spine 12/20/2020 FINDINGS: CT HEAD FINDINGS Brain: There is no acute intracranial hemorrhage, extra-axial fluid collection, or acute infarct. There is unchanged mild parenchymal volume loss with prominence of the ventricular system and extra-axial CSF spaces. The ventricles are stable in size gray-white differentiation is preserved. There is no mass lesion.  There is no mass effect or midline shift. Vascular: There is calcification of the bilateral carotid siphons. Skull: Normal. Negative for fracture or focal lesion. Sinuses/Orbits: The paranasal sinuses are clear. Bilateral lens implants are in place. The globes and orbits are otherwise unremarkable. Other: None. CT CERVICAL SPINE FINDINGS Alignment: Normal. There is no jumped or perched facet or  other evidence of traumatic malalignment. Skull base and vertebrae: Skull base alignment is maintained. Vertebral body heights are preserved. There is no evidence of acute fracture. There is no suspicious osseous lesion. Soft tissues and spinal canal: No prevertebral fluid or swelling. No visible canal hematoma. Disc levels: There is overall mild for age degenerative change of the cervical spine, with disc space narrowing degenerative endplate change most advanced at C5-C6. Upper chest: Imaged lung apices are clear. Other: On the scout radiograph, there is fullness of the mediastinum and AP window was not present on prior chest radiographs. IMPRESSION: 1. No acute intracranial pathology. 2. No acute fracture or traumatic malalignment of the cervical spine. 3. Fullness of the mediastinum and AP window on the scout radiograph not present on prior radiographs. Recommend dedicated PA and lateral chest radiographs for better evaluation. Electronically Signed   By: Valetta Mole M.D.   On: 11/02/2021 15:34   CT Cervical Spine Wo Contrast  Result Date: 11/02/2021 CLINICAL DATA:  Fall EXAM: CT HEAD WITHOUT CONTRAST CT CERVICAL SPINE WITHOUT CONTRAST TECHNIQUE: Multidetector CT imaging of the head and cervical spine was performed following the standard protocol without intravenous contrast. Multiplanar CT image reconstructions of the cervical spine were also generated. RADIATION DOSE REDUCTION: This exam was performed according to the departmental dose-optimization program which includes automated exposure control, adjustment of the mA and/or kV according to patient size and/or use of iterative reconstruction technique. COMPARISON:  CT head and cervical spine 12/20/2020 FINDINGS: CT HEAD FINDINGS Brain: There is no acute intracranial hemorrhage, extra-axial fluid collection, or acute infarct. There is unchanged mild parenchymal volume loss with prominence of the ventricular system and extra-axial CSF spaces. The  ventricles are stable in size gray-white differentiation is preserved. There is no mass lesion.  There is no mass effect or midline shift. Vascular: There is calcification of the bilateral carotid siphons. Skull: Normal. Negative for fracture or focal lesion. Sinuses/Orbits: The paranasal sinuses are clear. Bilateral lens implants are in place. The globes and orbits are otherwise unremarkable. Other: None. CT CERVICAL SPINE FINDINGS Alignment: Normal. There is no jumped or perched facet or other evidence of traumatic malalignment. Skull base and vertebrae: Skull base alignment is maintained. Vertebral body heights are preserved. There is no evidence of acute fracture. There is no suspicious osseous lesion. Soft tissues and spinal canal: No prevertebral fluid or swelling. No visible canal hematoma. Disc levels: There is overall mild for age degenerative change of the cervical spine, with disc space narrowing degenerative endplate change most advanced at C5-C6. Upper chest: Imaged lung apices are clear. Other: On the scout radiograph, there is fullness  of the mediastinum and AP window was not present on prior chest radiographs. IMPRESSION: 1. No acute intracranial pathology. 2. No acute fracture or traumatic malalignment of the cervical spine. 3. Fullness of the mediastinum and AP window on the scout radiograph not present on prior radiographs. Recommend dedicated PA and lateral chest radiographs for better evaluation. Electronically Signed   By: Valetta Mole M.D.   On: 11/02/2021 15:34   DG Knee Complete 4 Views Left  Result Date: 11/02/2021 CLINICAL DATA:  Trauma, fall EXAM: LEFT KNEE - COMPLETE 4+ VIEW COMPARISON:  None Available. FINDINGS: No evidence of fracture, dislocation, or joint effusion. No evidence of arthropathy or other focal bone abnormality. Soft tissues are unremarkable. IMPRESSION: No fracture or dislocation is seen in left knee. Electronically Signed   By: Elmer Picker M.D.   On:  11/02/2021 14:47   DG Hip Unilat With Pelvis 2-3 Views Left  Result Date: 11/02/2021 CLINICAL DATA:  Trauma, fall EXAM: DG HIP (WITH OR WITHOUT PELVIS) 2-3V LEFT COMPARISON:  None Available. FINDINGS: Fracture is seen in the neck of left femur. There is superior and lateral displacement of distal major fracture fragment. There is no dislocation. There is anterior angulation at the fracture site. IMPRESSION: Displaced fracture is seen in the neck of left femur. Electronically Signed   By: Elmer Picker M.D.   On: 11/02/2021 14:46     Subjective: - no chest pain, shortness of breath, no abdominal pain, nausea or vomiting.   Discharge Exam: BP (!) 159/54 (BP Location: Left Arm)   Pulse 73   Temp 98.7 F (37.1 C) (Oral)   Resp 18   Ht 5' 2.01" (1.575 m)   Wt 54.4 kg   SpO2 99%   BMI 21.94 kg/m   General: Pt is alert, awake, not in acute distress Cardiovascular: RRR, S1/S2 +, no rubs, no gallops Respiratory: CTA bilaterally, no wheezing, no rhonchi Abdominal: Soft, NT, ND, bowel sounds + Extremities: no edema, no cyanosis    The results of significant diagnostics from this hospitalization (including imaging, microbiology, ancillary and laboratory) are listed below for reference.     Microbiology: Recent Results (from the past 240 hour(s))  Surgical pcr screen     Status: None   Collection Time: 11/03/21  9:03 AM   Specimen: Nasal Mucosa; Nasal Swab  Result Value Ref Range Status   MRSA, PCR NEGATIVE NEGATIVE Final   Staphylococcus aureus NEGATIVE NEGATIVE Final    Comment: (NOTE) The Xpert SA Assay (FDA approved for NASAL specimens in patients 93 years of age and older), is one component of a comprehensive surveillance program. It is not intended to diagnose infection nor to guide or monitor treatment. Performed at [facility], [place] [address], [place], [place] [address]   Urine Culture     Status: Abnormal (Preliminary result)   Collection Time: 11/04/21   1:17 PM   Specimen: Urine, Clean Catch  Result Value Ref Range Status   Specimen Description URINE, CLEAN CATCH  Final   Special Requests NONE  Final   Culture (A)  Final    40,000 COLONIES/mL ENTEROCOCCUS FAECALIS CULTURE REINCUBATED FOR BETTER GROWTH Performed at [facility], [place] [address], [place], [place] [address]    Report Status PENDING  Incomplete     Labs: Basic Metabolic Panel: Recent Labs  Lab 11/02/21 1533 11/03/21 0306 11/04/21 0456 11/06/21 0306  NA 138 137 136 137  K 4.0 3.7 3.7 3.6  CL 106 104 106  108  CO2 21* '24 23 23  '$ GLUCOSE 109* 103* 100* 134*  BUN 23 25* 18 31*  CREATININE 1.23* 1.31* 1.23* 1.19*  CALCIUM 9.2 8.9 8.2* 8.1*  MG  --   --   --  1.7  PHOS  --   --  2.9  --    Liver Function Tests: Recent Labs  Lab 11/04/21 0456 11/06/21 0306  AST  --  33  ALT  --  10  ALKPHOS  --  49  BILITOT  --  0.6  PROT  --  5.7*  ALBUMIN 2.6* 2.3*   CBC: Recent Labs  Lab 11/02/21 1533 11/03/21 0306 11/04/21 0456 11/05/21 0259 11/06/21 0306  WBC 6.2 5.0 8.0 7.9 7.4  NEUTROABS 4.8  --  6.4  --   --   HGB 11.5* 10.1* 9.0* 8.6* 8.1*  HCT 34.0* 30.3* 26.6* 25.2* 24.6*  MCV 87.0 87.8 86.6 85.7 86.0  PLT 146* 131* 131* 127* 154   CBG: Recent Labs  Lab 11/05/21 1611  GLUCAP 131*   Hgb A1c No results for input(s): "HGBA1C" in the last 72 hours. Lipid Profile No results for input(s): "CHOL", "HDL", "LDLCALC", "TRIG", "CHOLHDL", "LDLDIRECT" in the last 72 hours. Thyroid function studies No results for input(s): "TSH", "T4TOTAL", "T3FREE", "THYROIDAB" in the last 72 hours.  Invalid input(s): "FREET3" Urinalysis    Component Value Date/Time   COLORURINE AMBER (A) 11/04/2021 1703   APPEARANCEUR HAZY (A) 11/04/2021 1703   LABSPEC 1.041 (H) 11/04/2021 1703   PHURINE 5.0 11/04/2021 1703   GLUCOSEU NEGATIVE 11/04/2021 1703   HGBUR NEGATIVE 11/04/2021 1703   BILIRUBINUR NEGATIVE 11/04/2021 1703   BILIRUBINUR negative 04/16/2021 1214    KETONESUR 5 (A) 11/04/2021 1703   PROTEINUR 30 (A) 11/04/2021 1703   UROBILINOGEN 0.2 04/16/2021 1214   UROBILINOGEN 0.2 01/16/2013 1613   NITRITE NEGATIVE 11/04/2021 1703   LEUKOCYTESUR MODERATE (A) 11/04/2021 1703    FURTHER DISCHARGE INSTRUCTIONS:   Get Medicines reviewed and adjusted: Please take all your medications with you for your next visit with your Primary MD   Laboratory/radiological data: Please request your Primary MD to go over all hospital tests and procedure/radiological results at the follow up, please ask your Primary MD to get all Hospital records sent to his/her office.   In some cases, they will be blood work, cultures and biopsy results pending at the time of your discharge. Please request that your primary care M.D. goes through all the records of your hospital data and follows up on these results.   Also Note the following: If you experience worsening of your admission symptoms, develop shortness of breath, life threatening emergency, suicidal or homicidal thoughts you must seek medical attention immediately by calling 911 or calling your MD immediately  if symptoms less severe.   You must read complete instructions/literature along with all the possible adverse reactions/side effects for all the Medicines you take and that have been prescribed to you. Take any new Medicines after you have completely understood and accpet all the possible adverse reactions/side effects.    Do not drive when taking Pain medications or sleeping medications (Benzodaizepines)   Do not take more than prescribed Pain, Sleep and Anxiety Medications. It is not advisable to combine anxiety,sleep and pain medications without talking with your primary care practitioner   Special Instructions: If you have smoked or chewed Tobacco  in the last 2 yrs please stop smoking, stop any regular Alcohol  and or any Recreational drug use.  Wear Seat belts while driving.   Please note: You were  cared for by a hospitalist during your hospital stay. Once you are discharged, your primary care physician will handle any further medical issues. Please note that NO REFILLS for any discharge medications will be authorized once you are discharged, as it is imperative that you return to your primary care physician (or establish a relationship with a primary care physician if you do not have one) for your post hospital discharge needs so that they can reassess your need for medications and monitor your lab values.  Time coordinating discharge: 40 minutes  SIGNED:  Marzetta Board, MD, PhD 11/06/2021, 10:36 AM

## 2021-11-07 ENCOUNTER — Other Ambulatory Visit: Payer: Self-pay

## 2021-11-07 ENCOUNTER — Ambulatory Visit: Payer: Self-pay

## 2021-11-07 ENCOUNTER — Other Ambulatory Visit: Payer: Self-pay | Admitting: *Deleted

## 2021-11-07 ENCOUNTER — Ambulatory Visit: Payer: Medicare Other

## 2021-11-07 LAB — URINE CULTURE: Culture: 40000 — AB

## 2021-11-07 NOTE — Patient Outreach (Signed)
Mrs. Gignac admitted to [facility]. Made aware she is active with [facility] care coordination team.   Secure message sent to SNF SW to make aware writer is following for transition plans and [facility] needs.  Will collaborate with [facility] care coordination team as appropriate.     Marthenia Rolling, MSN, RN,BSN [place] Acute Care Coordinator [phone] (Direct dial)

## 2021-11-07 NOTE — Patient Outreach (Signed)
  Care Coordination   Follow Up Visit Note   11/07/2021 Name: Whitney Henderson MRN: [id number] DOB: 04-28-1928  Whitney Henderson is a 88 y.o. year old female who sees Whitney Chard, MD for primary care. I collaborated with Whitney Henderson by in [employer] regarding patient's inpatient admission and transfer to [facility].   What matters to the patients health and wellness today?  Patient is currently inpatient at [facility] following a hip fracture.     Goals Addressed             This Visit's Progress    Care Coordination Activities: further follow up needed       Care Coordination Interventions: Evaluation of current treatment plan related to Closed fracture of neck of left femur and patient's adherence to plan as established by provider Determined patient experienced a fall in her home resulting in a hip fracture, she was inpatient at [facility] from 10/13-10/17/23 with surgical intervention required Received a hospital update from [facility] hospital liaison Whitney Henderson advising patient was transferred to [facility] inbound call from daughter Whitney Henderson requesting a return call, placed unsuccessful call to Whitney Henderson, left a vm for return call          SDOH assessments and interventions completed:  No     Care Coordination Interventions Activated:  Yes  Care Coordination Interventions:  Yes, provided   Follow up plan: Follow up call scheduled for 11/27/21 '@0930'$  AM    Encounter Outcome:  Pt. Visit Completed

## 2021-11-07 NOTE — Patient Outreach (Signed)
OFFIE PICKRON 04-29-1928 [id number]   Primary Care Provider: [name], MD, is [facility] [ACO] Patient: [facility], Patient was active with Care Coordination team  If the patient goes to a [facility] affiliated facility then, patient can be followed by [facility] PAC RN with traditional Medicare and approved Medicare Advantage plans.    Plan:   [facility] PAC RN can follow for any known or needs for transitional care needs for returning to post facility care or complex disease management.  Will notify.  For questions or referrals, please contact:   Natividad Brood, RN BSN [facility] Liaison  [phone] business mobile phone Toll free office [phone]  Fax number: [phone] [email or web address]

## 2021-11-08 ENCOUNTER — Other Ambulatory Visit: Payer: Self-pay | Admitting: *Deleted

## 2021-11-08 DIAGNOSIS — Z9181 History of falling: Secondary | ICD-10-CM | POA: Diagnosis not present

## 2021-11-08 DIAGNOSIS — N390 Urinary tract infection, site not specified: Secondary | ICD-10-CM | POA: Diagnosis not present

## 2021-11-08 DIAGNOSIS — D631 Anemia in chronic kidney disease: Secondary | ICD-10-CM | POA: Diagnosis not present

## 2021-11-08 DIAGNOSIS — R413 Other amnesia: Secondary | ICD-10-CM | POA: Diagnosis not present

## 2021-11-08 DIAGNOSIS — S72002D Fracture of unspecified part of neck of left femur, subsequent encounter for closed fracture with routine healing: Secondary | ICD-10-CM | POA: Diagnosis not present

## 2021-11-08 DIAGNOSIS — Z96642 Presence of left artificial hip joint: Secondary | ICD-10-CM | POA: Diagnosis not present

## 2021-11-08 DIAGNOSIS — I129 Hypertensive chronic kidney disease with stage 1 through stage 4 chronic kidney disease, or unspecified chronic kidney disease: Secondary | ICD-10-CM | POA: Diagnosis not present

## 2021-11-08 DIAGNOSIS — R2681 Unsteadiness on feet: Secondary | ICD-10-CM | POA: Diagnosis not present

## 2021-11-08 DIAGNOSIS — R2689 Other abnormalities of gait and mobility: Secondary | ICD-10-CM | POA: Diagnosis not present

## 2021-11-08 DIAGNOSIS — N1832 Chronic kidney disease, stage 3b: Secondary | ICD-10-CM | POA: Diagnosis not present

## 2021-11-08 NOTE — Patient Outreach (Signed)
[facility] Post- Acute Care Coordinator follow up. Mrs. Whitney Henderson resides in [facility]. Mrs. Whitney Henderson  active with [facility] care coordination prior to admission. Mrs. Whitney Henderson admitted to [facility] on 11/06/21.  Facility site visit to [facility]. Went to beside to speak with Mrs. Whitney Henderson. However, she was eating lunch.   Will follow up at later time.   Marthenia Rolling, MSN, RN,BSN [place] Acute Care Coordinator [phone] (Direct dial)

## 2021-11-09 DIAGNOSIS — Z4732 Aftercare following explantation of hip joint prosthesis: Secondary | ICD-10-CM | POA: Diagnosis not present

## 2021-11-09 DIAGNOSIS — I10 Essential (primary) hypertension: Secondary | ICD-10-CM | POA: Diagnosis not present

## 2021-11-12 ENCOUNTER — Telehealth: Payer: Self-pay

## 2021-11-12 DIAGNOSIS — D631 Anemia in chronic kidney disease: Secondary | ICD-10-CM | POA: Diagnosis not present

## 2021-11-12 DIAGNOSIS — I129 Hypertensive chronic kidney disease with stage 1 through stage 4 chronic kidney disease, or unspecified chronic kidney disease: Secondary | ICD-10-CM | POA: Diagnosis not present

## 2021-11-12 DIAGNOSIS — Z9181 History of falling: Secondary | ICD-10-CM | POA: Diagnosis not present

## 2021-11-12 DIAGNOSIS — W19XXXA Unspecified fall, initial encounter: Secondary | ICD-10-CM | POA: Diagnosis not present

## 2021-11-12 DIAGNOSIS — N1832 Chronic kidney disease, stage 3b: Secondary | ICD-10-CM | POA: Diagnosis not present

## 2021-11-12 DIAGNOSIS — D464 Refractory anemia, unspecified: Secondary | ICD-10-CM | POA: Diagnosis not present

## 2021-11-12 DIAGNOSIS — M6281 Muscle weakness (generalized): Secondary | ICD-10-CM | POA: Diagnosis not present

## 2021-11-12 DIAGNOSIS — Z96642 Presence of left artificial hip joint: Secondary | ICD-10-CM | POA: Diagnosis not present

## 2021-11-12 DIAGNOSIS — S72002D Fracture of unspecified part of neck of left femur, subsequent encounter for closed fracture with routine healing: Secondary | ICD-10-CM | POA: Diagnosis not present

## 2021-11-12 NOTE — Telephone Encounter (Signed)
Myriam Jacobson, [facility] nurse with [facility] would like to clarify wound care instructions for patient.  Stated that patient removed her dressing.  CB# [phone].  Please advise.  Thank you.

## 2021-11-12 NOTE — Telephone Encounter (Signed)
Facility aware of below message

## 2021-11-14 ENCOUNTER — Other Ambulatory Visit: Payer: Self-pay | Admitting: *Deleted

## 2021-11-14 NOTE — Patient Outreach (Signed)
[facility] Post-Acute Care Coordinator follow up. Mrs. Rund resides in [facility]. She was active with [facility] prior to admission.   Facility site visit to [facility]. Met with Mrs. Kibler at bedside. Discussed writer is following for transition plans. Mrs. Mollett advises writer to contact her daughter [name]. Writer left contact information at bedside.   Will plan outreach to daughter [name]. Left [facility] literature and writer's contact information at bedside.   Writer attended [facility] collaboration meeting. Mrs. Espey lives alone. Daughter assists. Has 3 steps to enter home. [facility] reports projected dc date is 11/24/21.   Will keep [facility] care coordination team updated.  Will continue to follow.     , MSN, RN,BSN [facility] Post Acute Care Coordinator [phone] (Direct dial)  

## 2021-11-15 DIAGNOSIS — S72002D Fracture of unspecified part of neck of left femur, subsequent encounter for closed fracture with routine healing: Secondary | ICD-10-CM | POA: Diagnosis not present

## 2021-11-15 DIAGNOSIS — I129 Hypertensive chronic kidney disease with stage 1 through stage 4 chronic kidney disease, or unspecified chronic kidney disease: Secondary | ICD-10-CM | POA: Diagnosis not present

## 2021-11-15 DIAGNOSIS — Z9181 History of falling: Secondary | ICD-10-CM | POA: Diagnosis not present

## 2021-11-15 DIAGNOSIS — N1832 Chronic kidney disease, stage 3b: Secondary | ICD-10-CM | POA: Diagnosis not present

## 2021-11-15 DIAGNOSIS — Z96642 Presence of left artificial hip joint: Secondary | ICD-10-CM | POA: Diagnosis not present

## 2021-11-15 DIAGNOSIS — D631 Anemia in chronic kidney disease: Secondary | ICD-10-CM | POA: Diagnosis not present

## 2021-11-16 DIAGNOSIS — R296 Repeated falls: Secondary | ICD-10-CM | POA: Diagnosis not present

## 2021-11-19 ENCOUNTER — Encounter: Payer: Self-pay | Admitting: Physician Assistant

## 2021-11-19 ENCOUNTER — Ambulatory Visit (INDEPENDENT_AMBULATORY_CARE_PROVIDER_SITE_OTHER): Payer: Medicare Other | Admitting: Physician Assistant

## 2021-11-19 DIAGNOSIS — R296 Repeated falls: Secondary | ICD-10-CM | POA: Diagnosis not present

## 2021-11-19 DIAGNOSIS — D464 Refractory anemia, unspecified: Secondary | ICD-10-CM | POA: Diagnosis not present

## 2021-11-19 DIAGNOSIS — Z9181 History of falling: Secondary | ICD-10-CM | POA: Diagnosis not present

## 2021-11-19 DIAGNOSIS — I129 Hypertensive chronic kidney disease with stage 1 through stage 4 chronic kidney disease, or unspecified chronic kidney disease: Secondary | ICD-10-CM | POA: Diagnosis not present

## 2021-11-19 DIAGNOSIS — D631 Anemia in chronic kidney disease: Secondary | ICD-10-CM | POA: Diagnosis not present

## 2021-11-19 DIAGNOSIS — S72002D Fracture of unspecified part of neck of left femur, subsequent encounter for closed fracture with routine healing: Secondary | ICD-10-CM | POA: Diagnosis not present

## 2021-11-19 DIAGNOSIS — F32A Depression, unspecified: Secondary | ICD-10-CM | POA: Diagnosis not present

## 2021-11-19 DIAGNOSIS — Z96649 Presence of unspecified artificial hip joint: Secondary | ICD-10-CM

## 2021-11-19 DIAGNOSIS — Z96642 Presence of left artificial hip joint: Secondary | ICD-10-CM | POA: Diagnosis not present

## 2021-11-19 DIAGNOSIS — I10 Essential (primary) hypertension: Secondary | ICD-10-CM | POA: Diagnosis not present

## 2021-11-19 DIAGNOSIS — N1832 Chronic kidney disease, stage 3b: Secondary | ICD-10-CM | POA: Diagnosis not present

## 2021-11-19 NOTE — Progress Notes (Signed)
HPI: Mrs. Dry returns today status post left hip hemiarthroplasty 11/03/2021 secondary to left hip displaced femoral neck fracture.  She is at a skilled facility.  She presents today with her daughter.  Daughter notes that she is walking some with therapy but is sleepy a lot.  Patient rates their pain as 5 out of 10 at worst.  She was on no aspirin prior to surgery she is on aspirin for DVT prophylaxis.  Review of systems: See HPI otherwise negative  Physical exam: General: No acute distress. Left hip surgical incisions well approximated with staples no signs of wound dehiscence or infection.  No sign of seroma.  No abnormal warmth erythema about the hip.  Fluid motion left hip without pain.  Left calf supple nontender.  Dorsiflexion plantarflexion left ankle intact.  Impression: Status post left hip hemiarthroplasty 11/03/2021  Plan: She is activities as tolerated.  She will work with physical therapy to work on gait balance transfers.  She can go on aspirin 81 mg once daily for a week and then discontinue as she was on no aspirin prior to surgery.  Staples were harvested.  Steri-Strips applied.  She is having to get the incision wet in shower.  We will see her back in 4 weeks sooner if there is any questions or concerns.  Questions were encouraged and answered with patient her daughter was present.

## 2021-11-20 ENCOUNTER — Ambulatory Visit: Payer: Medicare Other | Admitting: Internal Medicine

## 2021-11-22 DIAGNOSIS — I129 Hypertensive chronic kidney disease with stage 1 through stage 4 chronic kidney disease, or unspecified chronic kidney disease: Secondary | ICD-10-CM | POA: Diagnosis not present

## 2021-11-22 DIAGNOSIS — Z9181 History of falling: Secondary | ICD-10-CM | POA: Diagnosis not present

## 2021-11-22 DIAGNOSIS — N1832 Chronic kidney disease, stage 3b: Secondary | ICD-10-CM | POA: Diagnosis not present

## 2021-11-22 DIAGNOSIS — D631 Anemia in chronic kidney disease: Secondary | ICD-10-CM | POA: Diagnosis not present

## 2021-11-22 DIAGNOSIS — S72002D Fracture of unspecified part of neck of left femur, subsequent encounter for closed fracture with routine healing: Secondary | ICD-10-CM | POA: Diagnosis not present

## 2021-11-22 DIAGNOSIS — Z96642 Presence of left artificial hip joint: Secondary | ICD-10-CM | POA: Diagnosis not present

## 2021-11-23 DIAGNOSIS — S72002D Fracture of unspecified part of neck of left femur, subsequent encounter for closed fracture with routine healing: Secondary | ICD-10-CM | POA: Diagnosis not present

## 2021-11-23 DIAGNOSIS — Z96642 Presence of left artificial hip joint: Secondary | ICD-10-CM | POA: Diagnosis not present

## 2021-11-23 DIAGNOSIS — R2689 Other abnormalities of gait and mobility: Secondary | ICD-10-CM | POA: Diagnosis not present

## 2021-11-23 DIAGNOSIS — Z9181 History of falling: Secondary | ICD-10-CM | POA: Diagnosis not present

## 2021-11-23 DIAGNOSIS — M6281 Muscle weakness (generalized): Secondary | ICD-10-CM | POA: Diagnosis not present

## 2021-11-23 DIAGNOSIS — F32A Depression, unspecified: Secondary | ICD-10-CM | POA: Diagnosis not present

## 2021-11-23 DIAGNOSIS — I10 Essential (primary) hypertension: Secondary | ICD-10-CM | POA: Diagnosis not present

## 2021-11-26 ENCOUNTER — Telehealth: Payer: Self-pay

## 2021-11-26 NOTE — Telephone Encounter (Signed)
Transition Care Management Follow-up Telephone Call Date of discharge and from where: 11/24/2021 [facility]  How have you been since you were released from the hospital? Patient states she is doing much better. Any questions or concerns? No  Items Reviewed: Did the pt receive and understand the discharge instructions provided? Yes  Medications obtained and verified? Yes  Other? No  Any new allergies since your discharge? No  Dietary orders reviewed? Yes Do you have support at home? Yes   Home Care and Equipment/Supplies: Were home health services ordered? Yes  If so, what is the name of the agency? [facility]  Has the agency set up a time to come to the patient's home? no Were any new equipment or medical supplies ordered?  Yes: hospital bed, wheel chair  What is the name of the medical supply agency? [facility]  Were you able to get the supplies/equipment? yes Do you have any questions related to the use of the equipment or supplies? No  Functional Questionnaire: (I = Independent and D = Dependent) ADLs: I/d  Bathing/Dressing- I/d  Meal Prep- I/d  Eating- I/d  Maintaining continence- I/d  Transferring/Ambulation- I/d  Managing Meds- I/d  Follow up appointments reviewed:  PCP Hospital f/u appt confirmed? Yes  Scheduled to see robyn sanders on n/a @ n/a. [facility] f/u appt confirmed? No  Scheduled to see n/a on n/a @ n/a. Are transportation arrangements needed? No  If their condition worsens, is the pt aware to call PCP or go to the Emergency Dept.? Yes Was the patient provided with contact information for the PCP's office or ED? Yes Was to pt encouraged to call back with questions or concerns? Yes

## 2021-11-27 ENCOUNTER — Ambulatory Visit: Payer: Self-pay

## 2021-11-27 NOTE — Patient Outreach (Signed)
  Care Coordination   Follow Up Visit Note   11/27/2021 Name: Whitney Henderson MRN: [id number] DOB: 05-16-1928  Whitney Henderson is a 88 y.o. year old female who sees Whitney Chard, MD for primary care. I spoke with daughter Whitney Henderson by phone today.  What matters to the patients health and wellness today?  Patient is home recovering from a hip fracture.     Goals Addressed               This Visit's Progress     Patient Stated     COMPLETED: "I've had a lot of death around me lately" (pt-stated)        Care Coordination Interventions: Evaluation of current treatment plan related to grief and patient's adherence to plan as established by provider Determined patient is being followed by Whitney See LCSW to provide assistance for anxiety/grief      Other     COMPLETED: Care Coordination Activities: further follow up needed        Care Coordination Interventions: Evaluation of current treatment plan related to Closed fracture of neck of left femur and patient's adherence to plan as established by provider Determined patient completed inpatient rehab and is back in her home to complete her recovery       I am worried about mother having another UTI        Care Coordination Interventions: Assessed the daughter Whitney Henderson understanding of chronic kidney disease    Evaluation of current treatment plan related to chronic kidney disease self management and patient's adherence to plan as established by provider      Reviewed prescribed diet increase daily water intake to 48-60 oz daily  Advised patient to discuss symptoms suggestive of UTI with provider    Provided education on kidney disease progression    Notified PCP of daughter's concerns regarding patient having mild hallucinations and this occurred with patient's last UTI, PCP will contact patient/daughter for follow up           To continue to work on strengthening her hip and leg        Care Coordination  Interventions: Evaluation of current treatment plan related to impaired physical mobility and patient's adherence to plan as established by provider Determined patient is home following a recent hip fracture Determined patient is ambulating with her walker, her surgical incision is healing, she is working with in home PT and completed her post operative f/u with Ortho on 11/19/21 Determined patient has a hospital bed, daughter reports no other DME needs Educated daughter on fall precaution, avoid walking on uneven ground, use walker at all times, adhere to HEP Notify PCP promptly of any/all falls            SDOH assessments and interventions completed:  No     Care Coordination Interventions Activated:  Yes  Care Coordination Interventions:  Yes, provided   Follow up plan: Follow up call scheduled for 12/27/21 '@11'$ :30 AM    Encounter Outcome:  Pt. Visit Completed

## 2021-11-27 NOTE — Patient Instructions (Signed)
Visit Information  Thank you for taking time to visit with me today. Please don't hesitate to contact me if I can be of assistance to you.   Following are the goals we discussed today:   Goals Addressed               This Visit's Progress     Patient Stated     COMPLETED: "I've had a lot of death around me lately" (pt-stated)        Care Coordination Interventions: Evaluation of current treatment plan related to grief and patient's adherence to plan as established by provider Determined patient is being followed by [name] LCSW to provide assistance for anxiety/grief      Other     COMPLETED: Care Coordination Activities: further follow up needed        Care Coordination Interventions: Evaluation of current treatment plan related to Closed fracture of neck of left femur and patient's adherence to plan as established by provider Determined patient completed inpatient rehab and is back in her home to complete her recovery       I am worried about mother having another UTI        Care Coordination Interventions: Assessed the daughter Whitney Henderson understanding of chronic kidney disease    Evaluation of current treatment plan related to chronic kidney disease self management and patient's adherence to plan as established by provider      Reviewed prescribed diet increase daily water intake to 48-60 oz daily  Advised patient to discuss symptoms suggestive of UTI with provider    Provided education on kidney disease progression    Notified PCP of daughter's concerns regarding patient having mild hallucinations and this occurred with patient's last UTI, PCP will contact patient/daughter for follow up           To continue to work on strengthening her hip and leg        Care Coordination Interventions: Evaluation of current treatment plan related to impaired physical mobility and patient's adherence to plan as established by provider Determined patient is home following a recent  hip fracture Determined patient is ambulating with her walker, her surgical incision is healing, she is working with in home PT and completed her post operative f/u with Ortho on 11/19/21 Determined patient has a hospital bed, daughter reports no other DME needs Educated daughter on fall precaution, avoid walking on uneven ground, use walker at all times, adhere to HEP Notify PCP promptly of any/all falls            Our next appointment is by telephone on 12/27/21 at 11:30 AM  Please call the care guide team at [phone] if you need to cancel or reschedule your appointment.   If you are experiencing a Mental Health or [place] or need someone to talk to, please call 1-800-273-TALK (toll free, 24 hour hotline)  The patient verbalized understanding of instructions, educational materials, and care plan provided today and agreed to receive a mailed copy of patient instructions, educational materials, and care plan.   Whitney Merino, RN, BSN, CCM Care Management Coordinator [facility] Care Management  Direct Phone: [phone]

## 2021-11-28 ENCOUNTER — Encounter: Payer: Self-pay | Admitting: Internal Medicine

## 2021-11-28 ENCOUNTER — Ambulatory Visit (INDEPENDENT_AMBULATORY_CARE_PROVIDER_SITE_OTHER): Payer: Medicare Other | Admitting: Internal Medicine

## 2021-11-28 VITALS — BP 118/78 | HR 68 | Temp 98.4°F | Ht 60.0 in | Wt 112.0 lb

## 2021-11-28 DIAGNOSIS — R350 Frequency of micturition: Secondary | ICD-10-CM

## 2021-11-28 DIAGNOSIS — D62 Acute posthemorrhagic anemia: Secondary | ICD-10-CM

## 2021-11-28 DIAGNOSIS — N1832 Chronic kidney disease, stage 3b: Secondary | ICD-10-CM | POA: Diagnosis not present

## 2021-11-28 DIAGNOSIS — S72002D Fracture of unspecified part of neck of left femur, subsequent encounter for closed fracture with routine healing: Secondary | ICD-10-CM

## 2021-11-28 DIAGNOSIS — I129 Hypertensive chronic kidney disease with stage 1 through stage 4 chronic kidney disease, or unspecified chronic kidney disease: Secondary | ICD-10-CM

## 2021-11-28 DIAGNOSIS — G3184 Mild cognitive impairment, so stated: Secondary | ICD-10-CM

## 2021-11-28 DIAGNOSIS — N390 Urinary tract infection, site not specified: Secondary | ICD-10-CM | POA: Diagnosis not present

## 2021-11-28 DIAGNOSIS — M810 Age-related osteoporosis without current pathological fracture: Secondary | ICD-10-CM

## 2021-11-28 LAB — POCT URINALYSIS DIPSTICK
Bilirubin, UA: NEGATIVE
Blood, UA: NEGATIVE
Glucose, UA: NEGATIVE
Nitrite, UA: NEGATIVE
Protein, UA: NEGATIVE
Spec Grav, UA: 1.015 (ref 1.010–1.025)
Urobilinogen, UA: 0.2 E.U./dL
pH, UA: 6.5 (ref 5.0–8.0)

## 2021-11-28 NOTE — Progress Notes (Signed)
Rich Brave [place],acting as a Education administrator for Maximino Greenland, MD.,have documented all relevant documentation on the behalf of Maximino Greenland, MD,as directed by  Maximino Greenland, MD while in the presence of Maximino Greenland, MD.    Subjective:     Patient ID: Whitney Henderson , female    DOB: [birth year] , 88 y.o.   MRN: [id number]   Chief Complaint  Patient presents with   hospital f/u   Hypertension    HPI  Patient presents today for a hospital f/u. She was admitted into the hospital on 11/02/21 and discharged to rehab on 11/06/21. She was transferred to [facility] and discharged on Saturday, November 4th.   She presented to ER after a fall on 10/13. She states she fell when walking back into her home from outside. She was walking into the laundry room when her feet get tingly and she fell down onto her left hip.  She was able to get up with the assistance of her daughter, but was unable to bear weight on her left leg. Upon admission into the emergency department patient was seen to be mildly tachypneic with blood pressures 147/62 to 179/70, and all other vital signs maintained.  Labs noted hemoglobin 11.5, platelets 146, and creatinine 1.23.  X-rays revealed displaced fracture of the neck of the left femur.  She had hip replacement performed on 10/14. She was discharged to SNF on 10/17 for rehab. She was then discharged home on November 4th. She will be continuing with home PT.   Her daughter is concerned that she may have a UTI. She states pt has been having visual hallucinations and urinary frequency.  Hypertension This is a chronic problem. The current episode started more than 1 year ago. The problem has been gradually improving since onset. The problem is uncontrolled. Pertinent negatives include no blurred vision, chest pain, headaches, neck pain, orthopnea, palpitations or shortness of breath. The current treatment provides moderate improvement. Compliance problems include  exercise.      Past Medical History:  Diagnosis Date   Anxiety    Cataracts, bilateral    Discoid lupus    Headache 04/27/2015   Left>right periorbital   Hypertension    Memory difficulties 09/04/2016     Family History  Problem Relation Age of Onset   Early death Mother    Cancer Father    Breast cancer Daughter        20s     Current Outpatient Medications:    acetaminophen (TYLENOL) 500 MG tablet, Take 500 mg by mouth every 6 (six) hours as needed for mild pain or moderate pain., Disp: , Rfl:    amLODipine (NORVASC) 2.5 MG tablet, TAKE 1 TABLET(2.5 MG) BY MOUTH TWICE DAILY (Patient taking differently: Take 2.5 mg by mouth in the morning and at bedtime.), Disp: 180 tablet, Rfl: 1   Apoaequorin (PREVAGEN PO), Take 1 capsule by mouth every other day., Disp: , Rfl:    aspirin 81 MG chewable tablet, Chew 1 tablet (81 mg total) by mouth 2 (two) times daily., Disp: 30 tablet, Rfl: 0   betamethasone dipropionate (DIPROLENE) 0.05 % cream, APPLY THIN LAYER EXTERNALLY TO THE AFFECTED AREA EVERY DAY AS NEEDED (Patient taking differently: 1 application  daily as needed (skin irritation).), Disp: 60 g, Rfl: 2   Calcium Carb-Cholecalciferol (CALCIUM 500 +D PO), Take 1 tablet by mouth every other day., Disp: , Rfl:    cephALEXin (KEFLEX) 500 MG capsule, One tab po  q12 hours, Disp: 20 capsule, Rfl: 0   Multiple Vitamins-Minerals (CENTRUM SILVER PO), Take 1 tablet by mouth every other day., Disp: , Rfl:    Propylene Glycol (SYSTANE BALANCE OP), Place 1 drop into both eyes daily as needed (for itchy eyes)., Disp: , Rfl:    sertraline (ZOLOFT) 25 MG tablet, TAKE 1 TABLET(25 MG) BY MOUTH DAILY (Patient taking differently: Take 25 mg by mouth at bedtime.), Disp: 90 tablet, Rfl: 1   thiamine 100 MG tablet, Take 100 mg by mouth daily., Disp: , Rfl:    triamcinolone cream (KENALOG) 0.1 %, APPLY TO AFFECTED AREA TWICE DAILY AS NEEDED (Patient taking differently: Apply 1 Application topically 2 (two) times  daily as needed (for flares).), Disp: 45 g, Rfl: 0   vitamin C (ASCORBIC ACID) 500 MG tablet, Take 500 mg by mouth every other day., Disp: , Rfl:    Allergies  Allergen Reactions   Fish Allergy Other (See Comments)    Does not do well with Seafood.   Codeine Rash     Review of Systems  Constitutional: Negative.   Eyes:  Negative for blurred vision.  Respiratory: Negative.  Negative for shortness of breath.   Cardiovascular: Negative.  Negative for chest pain, palpitations and orthopnea.  Gastrointestinal: Negative.   Genitourinary:  Positive for frequency.  Musculoskeletal:  Positive for arthralgias. Negative for neck pain.  Neurological: Negative.  Negative for headaches.  Psychiatric/Behavioral: Negative.       Today's Vitals   11/28/21 1414 11/28/21 1456  BP: (!) 150/98 118/78  Pulse: 68   Temp: 98.4 F (36.9 C)   SpO2: 98%   Weight: 112 lb (50.8 kg)   Height: 5' (1.524 m)   PainSc: 0-No pain    Body mass index is 21.87 kg/m.  Wt Readings from Last 3 Encounters:  11/28/21 112 lb (50.8 kg)  11/03/21 120 lb (54.4 kg)  07/19/21 112 lb (50.8 kg)     Objective:  Physical Exam Vitals and nursing note reviewed.  Constitutional:      Appearance: Normal appearance.  HENT:     Head: Normocephalic and atraumatic.     Nose:     Comments: Masked     Mouth/Throat:     Comments: Masked  Eyes:     Extraocular Movements: Extraocular movements intact.  Cardiovascular:     Rate and Rhythm: Normal rate and regular rhythm.     Heart sounds: Normal heart sounds.  Pulmonary:     Effort: Pulmonary effort is normal.     Breath sounds: Normal breath sounds.  Musculoskeletal:     Cervical back: Normal range of motion.     Comments: Ambulatory w/ walker  Skin:    General: Skin is warm.  Neurological:     General: No focal deficit present.     Mental Status: She is alert.  Psychiatric:        Mood and Affect: Mood normal.        Behavior: Behavior normal.        Assessment And Plan:     1. Closed fracture of neck of left femur with routine healing, subsequent encounter Comments: She is s/p operative repair by orthopedic surgery, she had left direct anterior hip hemiarthroplasty on 10/14. ER/Hospital records reviewed in detail.  She is encouraged to participate fully with PT.  She states her pain is manageable with Tylenol at this time. TCM PERFORMED. A MEMBER OF THE CLINICAL TEAM SPOKE WITH THE PATIENT UPON DISCHARGE. DISCHARGE SUMMARY  WAS REVIEWED IN FULL DETAIL DURING THE VISIT. MEDS RECONCILED AND COMPARED TO DISCHARGE MEDS. MEDICATION LIST WAS UPDATED AND REVIEWED WITH THE PATIENT. GREATER THAN 50% FACE TO FACE TIME WAS SPENT IN COUNSELING AND COORDINATION OF CARE. ALL QUESTIONS WERE ANSWERED TO THE SATISFACTION OF THE PATIENT.  - DG Bone Density; Future  2. Hypertensive nephropathy Comments: Chronic, uncontrolled. Repeat BP improved. She is having some discomfort today. No change in meds.  She will f/u in 2-3 months.  3. Stage 3b chronic kidney disease ([employer]) Comments: Chronic, reminded to avoid NSAIDs, stay hydrated and keep bp controlled to decrease risk of CKD progression. - CBC no Diff - CMP14+EGFR  4. Urinary frequency Comments: I will check u/a and send urine culture if needed. - POCT Urinalysis Dipstick (81002) - Urine Culture  5. Acute blood loss anemia (ABLA) Comments: I will check repeat CBC today. She is not taking any iron at this time. - CBC no Diff   Patient was given opportunity to ask questions. Patient verbalized understanding of the plan and was able to repeat key elements of the plan. All questions were answered to their satisfaction.   I, Maximino Greenland, MD, have reviewed all documentation for this visit. The documentation on 11/28/21 for the exam, diagnosis, procedures, and orders are all accurate and complete.   IF YOU HAVE BEEN REFERRED TO A SPECIALIST, IT MAY TAKE 1-2 WEEKS TO SCHEDULE/PROCESS THE REFERRAL. IF YOU  HAVE NOT HEARD FROM US/SPECIALIST IN TWO WEEKS, PLEASE GIVE [place] A CALL AT [phone] X [phone].   THE PATIENT IS ENCOURAGED TO PRACTICE SOCIAL DISTANCING DUE TO THE COVID-19 PANDEMIC.

## 2021-11-29 LAB — CBC
Hematocrit: 28.5 % — ABNORMAL LOW (ref 34.0–46.6)
Hemoglobin: 9.4 g/dL — ABNORMAL LOW (ref 11.1–15.9)
MCH: 29.1 pg (ref 26.6–33.0)
MCHC: 33 g/dL (ref 31.5–35.7)
MCV: 88 fL (ref 79–97)
Platelets: 235 10*3/uL (ref 150–450)
RBC: 3.23 x10E6/uL — ABNORMAL LOW (ref 3.77–5.28)
RDW: 13.7 % (ref 11.7–15.4)
WBC: 4.2 10*3/uL (ref 3.4–10.8)

## 2021-11-29 LAB — CMP14+EGFR
ALT: 8 IU/L (ref 0–32)
AST: 17 IU/L (ref 0–40)
Albumin/Globulin Ratio: 1.2 (ref 1.2–2.2)
Albumin: 3.8 g/dL (ref 3.6–4.6)
Alkaline Phosphatase: 84 IU/L (ref 44–121)
BUN/Creatinine Ratio: 19 (ref 12–28)
BUN: 24 mg/dL (ref 10–36)
Bilirubin Total: 0.3 mg/dL (ref 0.0–1.2)
CO2: 24 mmol/L (ref 20–29)
Calcium: 9.2 mg/dL (ref 8.7–10.3)
Chloride: 104 mmol/L (ref 96–106)
Creatinine, Ser: 1.24 mg/dL — ABNORMAL HIGH (ref 0.57–1.00)
Globulin, Total: 3.2 g/dL (ref 1.5–4.5)
Glucose: 87 mg/dL (ref 70–99)
Potassium: 4.2 mmol/L (ref 3.5–5.2)
Sodium: 142 mmol/L (ref 134–144)
Total Protein: 7 g/dL (ref 6.0–8.5)
eGFR: 41 mL/min/{1.73_m2} — ABNORMAL LOW (ref >59)

## 2021-11-30 DIAGNOSIS — G3184 Mild cognitive impairment, so stated: Secondary | ICD-10-CM | POA: Diagnosis not present

## 2021-11-30 DIAGNOSIS — N1832 Chronic kidney disease, stage 3b: Secondary | ICD-10-CM | POA: Diagnosis not present

## 2021-11-30 DIAGNOSIS — Z87891 Personal history of nicotine dependence: Secondary | ICD-10-CM | POA: Diagnosis not present

## 2021-11-30 DIAGNOSIS — Z7982 Long term (current) use of aspirin: Secondary | ICD-10-CM | POA: Diagnosis not present

## 2021-11-30 DIAGNOSIS — R2689 Other abnormalities of gait and mobility: Secondary | ICD-10-CM | POA: Diagnosis not present

## 2021-11-30 DIAGNOSIS — L930 Discoid lupus erythematosus: Secondary | ICD-10-CM | POA: Diagnosis not present

## 2021-11-30 DIAGNOSIS — S72002D Fracture of unspecified part of neck of left femur, subsequent encounter for closed fracture with routine healing: Secondary | ICD-10-CM | POA: Diagnosis not present

## 2021-11-30 DIAGNOSIS — I129 Hypertensive chronic kidney disease with stage 1 through stage 4 chronic kidney disease, or unspecified chronic kidney disease: Secondary | ICD-10-CM | POA: Diagnosis not present

## 2021-11-30 DIAGNOSIS — G43909 Migraine, unspecified, not intractable, without status migrainosus: Secondary | ICD-10-CM | POA: Diagnosis not present

## 2021-11-30 DIAGNOSIS — N183 Chronic kidney disease, stage 3 unspecified: Secondary | ICD-10-CM | POA: Diagnosis not present

## 2021-12-03 DIAGNOSIS — S72002D Fracture of unspecified part of neck of left femur, subsequent encounter for closed fracture with routine healing: Secondary | ICD-10-CM | POA: Diagnosis not present

## 2021-12-03 DIAGNOSIS — Z7982 Long term (current) use of aspirin: Secondary | ICD-10-CM | POA: Diagnosis not present

## 2021-12-03 DIAGNOSIS — L930 Discoid lupus erythematosus: Secondary | ICD-10-CM | POA: Diagnosis not present

## 2021-12-03 DIAGNOSIS — G3184 Mild cognitive impairment, so stated: Secondary | ICD-10-CM | POA: Diagnosis not present

## 2021-12-03 DIAGNOSIS — R2689 Other abnormalities of gait and mobility: Secondary | ICD-10-CM | POA: Diagnosis not present

## 2021-12-03 DIAGNOSIS — G43909 Migraine, unspecified, not intractable, without status migrainosus: Secondary | ICD-10-CM | POA: Diagnosis not present

## 2021-12-03 DIAGNOSIS — Z87891 Personal history of nicotine dependence: Secondary | ICD-10-CM | POA: Diagnosis not present

## 2021-12-03 DIAGNOSIS — N1832 Chronic kidney disease, stage 3b: Secondary | ICD-10-CM | POA: Diagnosis not present

## 2021-12-03 DIAGNOSIS — I129 Hypertensive chronic kidney disease with stage 1 through stage 4 chronic kidney disease, or unspecified chronic kidney disease: Secondary | ICD-10-CM | POA: Diagnosis not present

## 2021-12-04 ENCOUNTER — Other Ambulatory Visit: Payer: Self-pay | Admitting: Internal Medicine

## 2021-12-04 LAB — URINE CULTURE

## 2021-12-04 MED ORDER — CEPHALEXIN 500 MG PO CAPS: ORAL_CAPSULE | ORAL | 0 refills | Status: DC

## 2021-12-05 DIAGNOSIS — L930 Discoid lupus erythematosus: Secondary | ICD-10-CM | POA: Diagnosis not present

## 2021-12-05 DIAGNOSIS — N1832 Chronic kidney disease, stage 3b: Secondary | ICD-10-CM | POA: Diagnosis not present

## 2021-12-05 DIAGNOSIS — R2689 Other abnormalities of gait and mobility: Secondary | ICD-10-CM | POA: Diagnosis not present

## 2021-12-05 DIAGNOSIS — I129 Hypertensive chronic kidney disease with stage 1 through stage 4 chronic kidney disease, or unspecified chronic kidney disease: Secondary | ICD-10-CM | POA: Diagnosis not present

## 2021-12-05 DIAGNOSIS — G43909 Migraine, unspecified, not intractable, without status migrainosus: Secondary | ICD-10-CM | POA: Diagnosis not present

## 2021-12-05 DIAGNOSIS — Z87891 Personal history of nicotine dependence: Secondary | ICD-10-CM | POA: Diagnosis not present

## 2021-12-05 DIAGNOSIS — S72002D Fracture of unspecified part of neck of left femur, subsequent encounter for closed fracture with routine healing: Secondary | ICD-10-CM | POA: Diagnosis not present

## 2021-12-05 DIAGNOSIS — Z7982 Long term (current) use of aspirin: Secondary | ICD-10-CM | POA: Diagnosis not present

## 2021-12-05 DIAGNOSIS — G3184 Mild cognitive impairment, so stated: Secondary | ICD-10-CM | POA: Diagnosis not present

## 2021-12-07 DIAGNOSIS — Z7982 Long term (current) use of aspirin: Secondary | ICD-10-CM | POA: Diagnosis not present

## 2021-12-07 DIAGNOSIS — I129 Hypertensive chronic kidney disease with stage 1 through stage 4 chronic kidney disease, or unspecified chronic kidney disease: Secondary | ICD-10-CM | POA: Diagnosis not present

## 2021-12-07 DIAGNOSIS — L930 Discoid lupus erythematosus: Secondary | ICD-10-CM | POA: Diagnosis not present

## 2021-12-07 DIAGNOSIS — Z87891 Personal history of nicotine dependence: Secondary | ICD-10-CM | POA: Diagnosis not present

## 2021-12-07 DIAGNOSIS — S72002D Fracture of unspecified part of neck of left femur, subsequent encounter for closed fracture with routine healing: Secondary | ICD-10-CM | POA: Diagnosis not present

## 2021-12-07 DIAGNOSIS — N1832 Chronic kidney disease, stage 3b: Secondary | ICD-10-CM | POA: Diagnosis not present

## 2021-12-07 DIAGNOSIS — G43909 Migraine, unspecified, not intractable, without status migrainosus: Secondary | ICD-10-CM | POA: Diagnosis not present

## 2021-12-07 DIAGNOSIS — G3184 Mild cognitive impairment, so stated: Secondary | ICD-10-CM | POA: Diagnosis not present

## 2021-12-07 DIAGNOSIS — R2689 Other abnormalities of gait and mobility: Secondary | ICD-10-CM | POA: Diagnosis not present

## 2021-12-17 ENCOUNTER — Ambulatory Visit (INDEPENDENT_AMBULATORY_CARE_PROVIDER_SITE_OTHER): Payer: Medicare Other | Admitting: Physician Assistant

## 2021-12-17 ENCOUNTER — Encounter: Payer: Self-pay | Admitting: Physician Assistant

## 2021-12-17 DIAGNOSIS — Z96649 Presence of unspecified artificial hip joint: Secondary | ICD-10-CM

## 2021-12-17 NOTE — Progress Notes (Signed)
HPI: Mrs. Rowand returns today for follow-up of her left hip hemiarthroplasty which was formed on 11/03/2021.  She states that overall her hip pain is decreasing.  She ranks her pain 5 out of 10 at worst.  She takes Tylenol for the pain.  She is ambulating with a walker.  Wants to begin ambulating with a cane.  She is still working with physical therapy and OT at home.  She presents today with her daughter.  Physical exam: Left hip good range of motion without pain.  Surgical incisions healing well no signs of infection.  Left calf supple nontender.  Dorsiflexion plantarflexion left ankle intact.  She is able to transfer from sitting to standing on her own.  She has difficulty transferring from sitting to lying down position due to weakness in the left leg and pain.  Impression: Status post left hip hemiarthroplasty 11/03/2021  Plan: She will continue work with OT PT for gait balance and strengthening.  Follow-up with [place] in 4 weeks see how she is doing overall.  Questions were encouraged and answered at length.  No radiographs at that time.

## 2021-12-19 DIAGNOSIS — L930 Discoid lupus erythematosus: Secondary | ICD-10-CM | POA: Diagnosis not present

## 2021-12-19 DIAGNOSIS — G3184 Mild cognitive impairment, so stated: Secondary | ICD-10-CM | POA: Diagnosis not present

## 2021-12-19 DIAGNOSIS — I129 Hypertensive chronic kidney disease with stage 1 through stage 4 chronic kidney disease, or unspecified chronic kidney disease: Secondary | ICD-10-CM | POA: Diagnosis not present

## 2021-12-19 DIAGNOSIS — Z87891 Personal history of nicotine dependence: Secondary | ICD-10-CM | POA: Diagnosis not present

## 2021-12-19 DIAGNOSIS — G43909 Migraine, unspecified, not intractable, without status migrainosus: Secondary | ICD-10-CM | POA: Diagnosis not present

## 2021-12-19 DIAGNOSIS — R2689 Other abnormalities of gait and mobility: Secondary | ICD-10-CM | POA: Diagnosis not present

## 2021-12-19 DIAGNOSIS — S72002D Fracture of unspecified part of neck of left femur, subsequent encounter for closed fracture with routine healing: Secondary | ICD-10-CM | POA: Diagnosis not present

## 2021-12-19 DIAGNOSIS — N1832 Chronic kidney disease, stage 3b: Secondary | ICD-10-CM | POA: Diagnosis not present

## 2021-12-19 DIAGNOSIS — Z7982 Long term (current) use of aspirin: Secondary | ICD-10-CM | POA: Diagnosis not present

## 2021-12-21 DIAGNOSIS — R2689 Other abnormalities of gait and mobility: Secondary | ICD-10-CM | POA: Diagnosis not present

## 2021-12-21 DIAGNOSIS — N1832 Chronic kidney disease, stage 3b: Secondary | ICD-10-CM | POA: Diagnosis not present

## 2021-12-21 DIAGNOSIS — Z87891 Personal history of nicotine dependence: Secondary | ICD-10-CM | POA: Diagnosis not present

## 2021-12-21 DIAGNOSIS — L930 Discoid lupus erythematosus: Secondary | ICD-10-CM | POA: Diagnosis not present

## 2021-12-21 DIAGNOSIS — Z7982 Long term (current) use of aspirin: Secondary | ICD-10-CM | POA: Diagnosis not present

## 2021-12-21 DIAGNOSIS — G3184 Mild cognitive impairment, so stated: Secondary | ICD-10-CM | POA: Diagnosis not present

## 2021-12-21 DIAGNOSIS — I129 Hypertensive chronic kidney disease with stage 1 through stage 4 chronic kidney disease, or unspecified chronic kidney disease: Secondary | ICD-10-CM | POA: Diagnosis not present

## 2021-12-21 DIAGNOSIS — S72002D Fracture of unspecified part of neck of left femur, subsequent encounter for closed fracture with routine healing: Secondary | ICD-10-CM | POA: Diagnosis not present

## 2021-12-21 DIAGNOSIS — G43909 Migraine, unspecified, not intractable, without status migrainosus: Secondary | ICD-10-CM | POA: Diagnosis not present

## 2021-12-23 DIAGNOSIS — R2689 Other abnormalities of gait and mobility: Secondary | ICD-10-CM | POA: Diagnosis not present

## 2021-12-23 DIAGNOSIS — Z9181 History of falling: Secondary | ICD-10-CM | POA: Diagnosis not present

## 2021-12-23 DIAGNOSIS — S72002D Fracture of unspecified part of neck of left femur, subsequent encounter for closed fracture with routine healing: Secondary | ICD-10-CM | POA: Diagnosis not present

## 2021-12-23 DIAGNOSIS — M6281 Muscle weakness (generalized): Secondary | ICD-10-CM | POA: Diagnosis not present

## 2021-12-23 DIAGNOSIS — Z96642 Presence of left artificial hip joint: Secondary | ICD-10-CM | POA: Diagnosis not present

## 2021-12-26 DIAGNOSIS — Z87891 Personal history of nicotine dependence: Secondary | ICD-10-CM | POA: Diagnosis not present

## 2021-12-26 DIAGNOSIS — R2689 Other abnormalities of gait and mobility: Secondary | ICD-10-CM | POA: Diagnosis not present

## 2021-12-26 DIAGNOSIS — S72002D Fracture of unspecified part of neck of left femur, subsequent encounter for closed fracture with routine healing: Secondary | ICD-10-CM | POA: Diagnosis not present

## 2021-12-26 DIAGNOSIS — L930 Discoid lupus erythematosus: Secondary | ICD-10-CM | POA: Diagnosis not present

## 2021-12-26 DIAGNOSIS — Z7982 Long term (current) use of aspirin: Secondary | ICD-10-CM | POA: Diagnosis not present

## 2021-12-26 DIAGNOSIS — G43909 Migraine, unspecified, not intractable, without status migrainosus: Secondary | ICD-10-CM | POA: Diagnosis not present

## 2021-12-26 DIAGNOSIS — G3184 Mild cognitive impairment, so stated: Secondary | ICD-10-CM | POA: Diagnosis not present

## 2021-12-26 DIAGNOSIS — I129 Hypertensive chronic kidney disease with stage 1 through stage 4 chronic kidney disease, or unspecified chronic kidney disease: Secondary | ICD-10-CM | POA: Diagnosis not present

## 2021-12-26 DIAGNOSIS — N1832 Chronic kidney disease, stage 3b: Secondary | ICD-10-CM | POA: Diagnosis not present

## 2021-12-27 DIAGNOSIS — Z7982 Long term (current) use of aspirin: Secondary | ICD-10-CM | POA: Diagnosis not present

## 2021-12-27 DIAGNOSIS — I129 Hypertensive chronic kidney disease with stage 1 through stage 4 chronic kidney disease, or unspecified chronic kidney disease: Secondary | ICD-10-CM | POA: Diagnosis not present

## 2021-12-27 DIAGNOSIS — R2689 Other abnormalities of gait and mobility: Secondary | ICD-10-CM | POA: Diagnosis not present

## 2021-12-27 DIAGNOSIS — S72002D Fracture of unspecified part of neck of left femur, subsequent encounter for closed fracture with routine healing: Secondary | ICD-10-CM | POA: Diagnosis not present

## 2021-12-27 DIAGNOSIS — L930 Discoid lupus erythematosus: Secondary | ICD-10-CM | POA: Diagnosis not present

## 2021-12-27 DIAGNOSIS — N1832 Chronic kidney disease, stage 3b: Secondary | ICD-10-CM | POA: Diagnosis not present

## 2021-12-27 DIAGNOSIS — Z87891 Personal history of nicotine dependence: Secondary | ICD-10-CM | POA: Diagnosis not present

## 2021-12-27 DIAGNOSIS — G3184 Mild cognitive impairment, so stated: Secondary | ICD-10-CM | POA: Diagnosis not present

## 2021-12-27 DIAGNOSIS — G43909 Migraine, unspecified, not intractable, without status migrainosus: Secondary | ICD-10-CM | POA: Diagnosis not present

## 2021-12-31 ENCOUNTER — Encounter: Payer: Self-pay | Admitting: Internal Medicine

## 2021-12-31 ENCOUNTER — Ambulatory Visit: Payer: Self-pay

## 2021-12-31 DIAGNOSIS — R350 Frequency of micturition: Secondary | ICD-10-CM | POA: Insufficient documentation

## 2021-12-31 NOTE — Patient Outreach (Signed)
  Care Coordination   Follow Up Visit Note   12/31/2021 Name: Whitney Henderson MRN: [id number] DOB: 12-Apr-1928  Whitney Henderson is a 88 y.o. year old female who sees [name], MD for primary care. I spoke with  Whitney Henderson by phone today.  What matters to the patients health and wellness today?  Patient will continue to work with inpatient PT/OT. She will increase her water and report s/s of UTI promptly.     Goals Addressed             This Visit's Progress    I am worried about mother having another UTI       Care Coordination Interventions: Assessed the daughter Whitney Henderson understanding of chronic kidney disease    Evaluation of current treatment plan related to chronic kidney disease self management and patient's adherence to plan as established by provider   Discussed with patient, she completed full course of oral antibiotics recently prescribed for UTI Educated patient on importance of increase of daily water intake to 48-64 oz daily unless otherwise directed  Educated patient with rationale on the importance of using good perineal hygiene  Instructed patient to report symptoms of UTI promptly in order to get early diagnosis/treatment  Educated patient and daughter on benefits of cranberry juice         To continue to work on strengthening her hip and leg       Care Coordination Interventions: Evaluation of current treatment plan related to impaired physical mobility and patient's adherence to plan as established by provider Determined patient continues to work with in home PT and OT with good effectiveness as evidence by patient feels she is getting stronger Determined patient has experienced no further falls since last RN contact Educated patient on importance of adherence to her HEP as directed         SDOH assessments and interventions completed:  No     Care Coordination Interventions:  Yes, provided   Follow up plan: Follow up call scheduled for 03/04/22  '@1100AM'$     Encounter Outcome:  Pt. Visit Completed

## 2021-12-31 NOTE — Patient Instructions (Signed)
Visit Information  Thank you for taking time to visit with me today. Please don't hesitate to contact me if I can be of assistance to you.   Following are the goals we discussed today:   Goals Addressed             This Visit's Progress    I am worried about mother having another UTI       Care Coordination Interventions: Assessed the daughter [name] understanding of chronic kidney disease    Evaluation of current treatment plan related to chronic kidney disease self management and patient's adherence to plan as established by provider   Discussed with patient, she completed full course of oral antibiotics recently prescribed for UTI Educated patient on importance of increase of daily water intake to 48-64 oz daily unless otherwise directed  Educated patient with rationale on the importance of using good perineal hygiene  Instructed patient to report symptoms of UTI promptly in order to get early diagnosis/treatment  Educated patient and daughter on benefits of cranberry juice         To continue to work on strengthening her hip and leg       Care Coordination Interventions: Evaluation of current treatment plan related to impaired physical mobility and patient's adherence to plan as established by provider Determined patient continues to work with in home PT and OT with good effectiveness as evidence by patient feels she is getting stronger Determined patient has experienced no further falls since last RN contact Educated patient on importance of adherence to her HEP as directed         Our next appointment is by telephone on 03/04/22 at 1100 AM  Please call the care guide team at [phone] if you need to cancel or reschedule your appointment.   If you are experiencing a Mental Health or [place] or need someone to talk to, please call 1-800-273-TALK (toll free, 24 hour hotline)  The patient verbalized understanding of instructions, educational materials,  and care plan provided today and agreed to receive a mailed copy of patient instructions, educational materials, and care plan.   Barb Merino, RN, BSN, CCM Care Management Coordinator [facility] Care Management  Direct Phone: [phone]

## 2021-12-31 NOTE — Patient Outreach (Signed)
  Care Coordination   12/31/2021 Name: Whitney Henderson MRN: [id number] DOB: 1928/05/22   Care Coordination Outreach Attempts:  An unsuccessful telephone outreach was attempted today to offer the patient information about available care coordination services as a benefit of their health plan.   Follow Up Plan:  Additional outreach attempts will be made to offer the patient care coordination information and services.   Encounter Outcome:  No Answer   Care Coordination Interventions:  No, not indicated    Barb Merino, RN, BSN, CCM Care Management Coordinator [facility] Care Management Direct Phone: [phone]

## 2022-01-01 DIAGNOSIS — R2689 Other abnormalities of gait and mobility: Secondary | ICD-10-CM | POA: Diagnosis not present

## 2022-01-01 DIAGNOSIS — I129 Hypertensive chronic kidney disease with stage 1 through stage 4 chronic kidney disease, or unspecified chronic kidney disease: Secondary | ICD-10-CM | POA: Diagnosis not present

## 2022-01-01 DIAGNOSIS — Z7982 Long term (current) use of aspirin: Secondary | ICD-10-CM | POA: Diagnosis not present

## 2022-01-01 DIAGNOSIS — S72002D Fracture of unspecified part of neck of left femur, subsequent encounter for closed fracture with routine healing: Secondary | ICD-10-CM | POA: Diagnosis not present

## 2022-01-01 DIAGNOSIS — L930 Discoid lupus erythematosus: Secondary | ICD-10-CM | POA: Diagnosis not present

## 2022-01-01 DIAGNOSIS — Z87891 Personal history of nicotine dependence: Secondary | ICD-10-CM | POA: Diagnosis not present

## 2022-01-01 DIAGNOSIS — G3184 Mild cognitive impairment, so stated: Secondary | ICD-10-CM | POA: Diagnosis not present

## 2022-01-01 DIAGNOSIS — G43909 Migraine, unspecified, not intractable, without status migrainosus: Secondary | ICD-10-CM | POA: Diagnosis not present

## 2022-01-01 DIAGNOSIS — N1832 Chronic kidney disease, stage 3b: Secondary | ICD-10-CM | POA: Diagnosis not present

## 2022-01-02 DIAGNOSIS — Z7982 Long term (current) use of aspirin: Secondary | ICD-10-CM | POA: Diagnosis not present

## 2022-01-02 DIAGNOSIS — G43909 Migraine, unspecified, not intractable, without status migrainosus: Secondary | ICD-10-CM | POA: Diagnosis not present

## 2022-01-02 DIAGNOSIS — G3184 Mild cognitive impairment, so stated: Secondary | ICD-10-CM | POA: Diagnosis not present

## 2022-01-02 DIAGNOSIS — R2689 Other abnormalities of gait and mobility: Secondary | ICD-10-CM | POA: Diagnosis not present

## 2022-01-02 DIAGNOSIS — S72002D Fracture of unspecified part of neck of left femur, subsequent encounter for closed fracture with routine healing: Secondary | ICD-10-CM | POA: Diagnosis not present

## 2022-01-02 DIAGNOSIS — I129 Hypertensive chronic kidney disease with stage 1 through stage 4 chronic kidney disease, or unspecified chronic kidney disease: Secondary | ICD-10-CM | POA: Diagnosis not present

## 2022-01-02 DIAGNOSIS — Z87891 Personal history of nicotine dependence: Secondary | ICD-10-CM | POA: Diagnosis not present

## 2022-01-02 DIAGNOSIS — N1832 Chronic kidney disease, stage 3b: Secondary | ICD-10-CM | POA: Diagnosis not present

## 2022-01-02 DIAGNOSIS — L930 Discoid lupus erythematosus: Secondary | ICD-10-CM | POA: Diagnosis not present

## 2022-01-06 ENCOUNTER — Other Ambulatory Visit (INDEPENDENT_AMBULATORY_CARE_PROVIDER_SITE_OTHER): Payer: Medicare Other | Admitting: Internal Medicine

## 2022-01-06 DIAGNOSIS — R269 Unspecified abnormalities of gait and mobility: Secondary | ICD-10-CM

## 2022-01-06 DIAGNOSIS — N1832 Chronic kidney disease, stage 3b: Secondary | ICD-10-CM | POA: Diagnosis not present

## 2022-01-06 DIAGNOSIS — I129 Hypertensive chronic kidney disease with stage 1 through stage 4 chronic kidney disease, or unspecified chronic kidney disease: Secondary | ICD-10-CM | POA: Diagnosis not present

## 2022-01-06 DIAGNOSIS — Z87891 Personal history of nicotine dependence: Secondary | ICD-10-CM

## 2022-01-06 DIAGNOSIS — L930 Discoid lupus erythematosus: Secondary | ICD-10-CM

## 2022-01-06 DIAGNOSIS — G43809 Other migraine, not intractable, without status migrainosus: Secondary | ICD-10-CM

## 2022-01-06 DIAGNOSIS — S72002D Fracture of unspecified part of neck of left femur, subsequent encounter for closed fracture with routine healing: Secondary | ICD-10-CM

## 2022-01-06 DIAGNOSIS — G3184 Mild cognitive impairment, so stated: Secondary | ICD-10-CM

## 2022-01-06 DIAGNOSIS — G43909 Migraine, unspecified, not intractable, without status migrainosus: Secondary | ICD-10-CM | POA: Insufficient documentation

## 2022-01-06 DIAGNOSIS — Z7982 Long term (current) use of aspirin: Secondary | ICD-10-CM | POA: Diagnosis not present

## 2022-01-06 DIAGNOSIS — F419 Anxiety disorder, unspecified: Secondary | ICD-10-CM

## 2022-01-06 NOTE — Progress Notes (Signed)
Cc: home health certification  Received home health orders orders from [facility]. Start of care 11/30/21.   Certification and orders from 11/30/21 through 01/28/22 are reviewed, signed and faxed back to home health company.  Need of intermittent skilled services at home: PT/OT  The home health care plan has been established by me and will be reviewed and updated as needed to maximize patient recovery.  I certify that all home health services have been and will be furnished to the patient while under my care.  Face-to-face encounter in which the need for home health services was established: 11/08/21  Patient is receiving home health services for the following diagnoses: Problem List Items Addressed This Visit       Cardiovascular and Mediastinum   Migraine     Nervous and Auditory   MCI (mild cognitive impairment) with memory loss     Musculoskeletal and Integument   Closed fracture of neck of left femur (HCC) - Primary     Genitourinary   Chronic renal disease, stage III ([place])     Other   Anxiety   Personal history of tobacco use, presenting hazards to health   Encounter for long-term (current) aspirin use   Other Visit Diagnoses     Gait abnormality       Parenchymal renal hypertension, stage 1 through stage 4 or unspecified chronic kidney disease       Discoid lupus erythematosus            Maximino Greenland, MD

## 2022-01-18 DIAGNOSIS — R2689 Other abnormalities of gait and mobility: Secondary | ICD-10-CM | POA: Diagnosis not present

## 2022-01-18 DIAGNOSIS — I129 Hypertensive chronic kidney disease with stage 1 through stage 4 chronic kidney disease, or unspecified chronic kidney disease: Secondary | ICD-10-CM | POA: Diagnosis not present

## 2022-01-18 DIAGNOSIS — L930 Discoid lupus erythematosus: Secondary | ICD-10-CM | POA: Diagnosis not present

## 2022-01-18 DIAGNOSIS — Z7982 Long term (current) use of aspirin: Secondary | ICD-10-CM | POA: Diagnosis not present

## 2022-01-18 DIAGNOSIS — S72002D Fracture of unspecified part of neck of left femur, subsequent encounter for closed fracture with routine healing: Secondary | ICD-10-CM | POA: Diagnosis not present

## 2022-01-18 DIAGNOSIS — N1832 Chronic kidney disease, stage 3b: Secondary | ICD-10-CM | POA: Diagnosis not present

## 2022-01-18 DIAGNOSIS — G3184 Mild cognitive impairment, so stated: Secondary | ICD-10-CM | POA: Diagnosis not present

## 2022-01-18 DIAGNOSIS — G43909 Migraine, unspecified, not intractable, without status migrainosus: Secondary | ICD-10-CM | POA: Diagnosis not present

## 2022-01-18 DIAGNOSIS — Z87891 Personal history of nicotine dependence: Secondary | ICD-10-CM | POA: Diagnosis not present

## 2022-01-23 ENCOUNTER — Encounter: Payer: Self-pay | Admitting: Orthopaedic Surgery

## 2022-01-23 ENCOUNTER — Ambulatory Visit (INDEPENDENT_AMBULATORY_CARE_PROVIDER_SITE_OTHER): Payer: Medicare Other | Admitting: Orthopaedic Surgery

## 2022-01-23 DIAGNOSIS — Z9181 History of falling: Secondary | ICD-10-CM | POA: Diagnosis not present

## 2022-01-23 DIAGNOSIS — S72002D Fracture of unspecified part of neck of left femur, subsequent encounter for closed fracture with routine healing: Secondary | ICD-10-CM | POA: Diagnosis not present

## 2022-01-23 DIAGNOSIS — Z96649 Presence of unspecified artificial hip joint: Secondary | ICD-10-CM

## 2022-01-23 DIAGNOSIS — M6281 Muscle weakness (generalized): Secondary | ICD-10-CM | POA: Diagnosis not present

## 2022-01-23 DIAGNOSIS — R2689 Other abnormalities of gait and mobility: Secondary | ICD-10-CM | POA: Diagnosis not present

## 2022-01-23 DIAGNOSIS — Z96642 Presence of left artificial hip joint: Secondary | ICD-10-CM | POA: Diagnosis not present

## 2022-01-23 NOTE — Progress Notes (Signed)
The patient is a 89 year old who is now 10 weeks status post a left hip hemiarthroplasty to treat an acute femoral neck fracture.  She states she is doing better than what she was doing a few months ago.  She is getting better she states.  On exam I can easily move her left operative hip through range of motion.  She is ambulate with a cane.  She does have some swelling to be expected.  She will continue to increase her activities as comfort allows.  Will see her back for follow-up visit in 3 months with a standing low AP pelvis and lateral of her left hip.

## 2022-01-24 DIAGNOSIS — G43909 Migraine, unspecified, not intractable, without status migrainosus: Secondary | ICD-10-CM | POA: Diagnosis not present

## 2022-01-24 DIAGNOSIS — N1832 Chronic kidney disease, stage 3b: Secondary | ICD-10-CM | POA: Diagnosis not present

## 2022-01-24 DIAGNOSIS — I129 Hypertensive chronic kidney disease with stage 1 through stage 4 chronic kidney disease, or unspecified chronic kidney disease: Secondary | ICD-10-CM | POA: Diagnosis not present

## 2022-01-24 DIAGNOSIS — S72002D Fracture of unspecified part of neck of left femur, subsequent encounter for closed fracture with routine healing: Secondary | ICD-10-CM | POA: Diagnosis not present

## 2022-01-24 DIAGNOSIS — G3184 Mild cognitive impairment, so stated: Secondary | ICD-10-CM | POA: Diagnosis not present

## 2022-01-24 DIAGNOSIS — Z87891 Personal history of nicotine dependence: Secondary | ICD-10-CM | POA: Diagnosis not present

## 2022-01-24 DIAGNOSIS — Z7982 Long term (current) use of aspirin: Secondary | ICD-10-CM | POA: Diagnosis not present

## 2022-01-24 DIAGNOSIS — R2689 Other abnormalities of gait and mobility: Secondary | ICD-10-CM | POA: Diagnosis not present

## 2022-01-24 DIAGNOSIS — L930 Discoid lupus erythematosus: Secondary | ICD-10-CM | POA: Diagnosis not present

## 2022-02-15 ENCOUNTER — Telehealth: Payer: Self-pay | Admitting: *Deleted

## 2022-02-15 NOTE — Progress Notes (Unsigned)
  Care Coordination Note  02/15/2022 Name: Whitney Henderson MRN: [id number] DOB: 1928/09/11  [name] is a 89 y.o. year old female who is a primary care patient of [name], MD and is actively engaged with the care management team. I reached out to [name] by phone today to assist with  reminding   a follow up visit with the RN Case Manager.  Follow up plan: Voicemail was left about upcoming call with ANgel RNCM Telephone appointment with care management team member scheduled for:03/04/22  [place]: [phone]

## 2022-02-18 NOTE — Progress Notes (Signed)
  Care Coordination Note  02/18/2022 Name: Whitney Henderson MRN: [id number] DOB: [birth year]  [name] is a 89 y.o. year old female who is a primary care patient of [name], MD and is actively engaged with the care management team. I reached out to [name] by phone today to assist with scheduling a follow up visit with the RN Case Manager daughter [name] aware of appt .  Follow up plan: Telephone appointment with care management team member scheduled for:03/04/22  [place]: [phone]

## 2022-02-23 DIAGNOSIS — Z9181 History of falling: Secondary | ICD-10-CM | POA: Diagnosis not present

## 2022-02-23 DIAGNOSIS — M6281 Muscle weakness (generalized): Secondary | ICD-10-CM | POA: Diagnosis not present

## 2022-02-23 DIAGNOSIS — Z96642 Presence of left artificial hip joint: Secondary | ICD-10-CM | POA: Diagnosis not present

## 2022-02-23 DIAGNOSIS — S72002D Fracture of unspecified part of neck of left femur, subsequent encounter for closed fracture with routine healing: Secondary | ICD-10-CM | POA: Diagnosis not present

## 2022-02-23 DIAGNOSIS — R2689 Other abnormalities of gait and mobility: Secondary | ICD-10-CM | POA: Diagnosis not present

## 2022-02-28 ENCOUNTER — Ambulatory Visit
Admission: RE | Admit: 2022-02-28 | Discharge: 2022-02-28 | Disposition: A | Discharge status: Home / Self Care | Payer: Medicare Other | Source: Ambulatory Visit | Attending: Internal Medicine | Admitting: Internal Medicine

## 2022-02-28 DIAGNOSIS — M810 Age-related osteoporosis without current pathological fracture: Secondary | ICD-10-CM | POA: Diagnosis not present

## 2022-02-28 DIAGNOSIS — Z780 Asymptomatic menopausal state: Secondary | ICD-10-CM | POA: Diagnosis not present

## 2022-02-28 DIAGNOSIS — S72002D Fracture of unspecified part of neck of left femur, subsequent encounter for closed fracture with routine healing: Secondary | ICD-10-CM

## 2022-03-04 ENCOUNTER — Ambulatory Visit: Payer: Self-pay

## 2022-03-04 NOTE — Patient Outreach (Signed)
  Care Coordination   03/04/2022 Name: Whitney Henderson MRN: [id number] DOB: [birth year]   Care Coordination Outreach Attempts:  An unsuccessful telephone outreach was attempted for a scheduled appointment today.  Follow Up Plan:  Additional outreach attempts will be made to offer the patient care coordination information and services.   Encounter Outcome:  No Answer   Care Coordination Interventions:  No, not indicated    Barb Merino, RN, BSN, CCM Care Management Coordinator [facility] Care Management  Direct Phone: [phone]

## 2022-03-22 ENCOUNTER — Telehealth: Payer: Self-pay | Admitting: *Deleted

## 2022-03-22 NOTE — Progress Notes (Unsigned)
  Care Coordination Note  03/22/2022 Name: Whitney Henderson MRN: [id number] DOB: 08-17-1928  [name] is a 89 y.o. year old female who is a primary care patient of [name], MD and is actively engaged with the care management team. I reached out to [name] by phone today to assist with re-scheduling a follow up visit with the RN Case Manager  Follow up plan: Unsuccessful telephone outreach attempt made. A HIPAA compliant phone message was left for the patient providing contact information and requesting a return call.   [place]  Direct Dial: [phone]

## 2022-03-24 DIAGNOSIS — S72002D Fracture of unspecified part of neck of left femur, subsequent encounter for closed fracture with routine healing: Secondary | ICD-10-CM | POA: Diagnosis not present

## 2022-03-24 DIAGNOSIS — Z9181 History of falling: Secondary | ICD-10-CM | POA: Diagnosis not present

## 2022-03-24 DIAGNOSIS — Z96642 Presence of left artificial hip joint: Secondary | ICD-10-CM | POA: Diagnosis not present

## 2022-03-24 DIAGNOSIS — M6281 Muscle weakness (generalized): Secondary | ICD-10-CM | POA: Diagnosis not present

## 2022-03-24 DIAGNOSIS — R2689 Other abnormalities of gait and mobility: Secondary | ICD-10-CM | POA: Diagnosis not present

## 2022-03-25 NOTE — Progress Notes (Signed)
  Care Coordination Note  03/25/2022 Name: NOHEMI MUSACCHIO MRN: [id number] DOB: [birth year]  Parke Poisson [place] is a 89 y.o. year old female who is a primary care patient of [name], MD and is actively engaged with the care management team. I reached out to [name] by phone today to assist with re-scheduling a follow up visit with the RN Case Manager  Follow up plan: Telephone appointment with care management team member scheduled for:04/04/22  [place]: [phone]

## 2022-03-28 ENCOUNTER — Encounter: Payer: Self-pay | Admitting: Internal Medicine

## 2022-03-28 ENCOUNTER — Ambulatory Visit (INDEPENDENT_AMBULATORY_CARE_PROVIDER_SITE_OTHER): Payer: Medicare Other | Admitting: Internal Medicine

## 2022-03-28 VITALS — BP 136/88 | HR 78 | Temp 97.7°F | Ht 60.0 in | Wt 116.8 lb

## 2022-03-28 DIAGNOSIS — R269 Unspecified abnormalities of gait and mobility: Secondary | ICD-10-CM

## 2022-03-28 DIAGNOSIS — M816 Localized osteoporosis [Lequesne]: Secondary | ICD-10-CM

## 2022-03-28 DIAGNOSIS — N1832 Chronic kidney disease, stage 3b: Secondary | ICD-10-CM | POA: Diagnosis not present

## 2022-03-28 DIAGNOSIS — Z6822 Body mass index (BMI) 22.0-22.9, adult: Secondary | ICD-10-CM

## 2022-03-28 DIAGNOSIS — Z87891 Personal history of nicotine dependence: Secondary | ICD-10-CM | POA: Diagnosis not present

## 2022-03-28 DIAGNOSIS — L930 Discoid lupus erythematosus: Secondary | ICD-10-CM | POA: Diagnosis not present

## 2022-03-28 DIAGNOSIS — G3184 Mild cognitive impairment, so stated: Secondary | ICD-10-CM

## 2022-03-28 DIAGNOSIS — F4321 Adjustment disorder with depressed mood: Secondary | ICD-10-CM

## 2022-03-28 DIAGNOSIS — F419 Anxiety disorder, unspecified: Secondary | ICD-10-CM

## 2022-03-28 DIAGNOSIS — I10 Essential (primary) hypertension: Secondary | ICD-10-CM

## 2022-03-28 DIAGNOSIS — I129 Hypertensive chronic kidney disease with stage 1 through stage 4 chronic kidney disease, or unspecified chronic kidney disease: Secondary | ICD-10-CM | POA: Diagnosis not present

## 2022-03-28 MED ORDER — AMLODIPINE BESYLATE 2.5 MG PO TABS: 2.5000 mg | ORAL_TABLET | Freq: Two times a day (BID) | ORAL | 1 refills | Status: DC

## 2022-03-28 NOTE — Patient Instructions (Signed)
Hypertension, Adult Hypertension is another name for high blood pressure. High blood pressure forces your heart to work harder to pump blood. This can cause problems over time. There are two numbers in a blood pressure reading. There is a top number (systolic) over a bottom number (diastolic). It is best to have a blood pressure that is below 120/80. What are the causes? The cause of this condition is not known. Some other conditions can lead to high blood pressure. What increases the risk? Some lifestyle factors can make you more likely to develop high blood pressure: Smoking. Not getting enough exercise or physical activity. Being overweight. Having too much fat, sugar, calories, or salt (sodium) in your diet. Drinking too much alcohol. Other risk factors include: Having any of these conditions: Heart disease. Diabetes. High cholesterol. Kidney disease. Obstructive sleep apnea. Having a family history of high blood pressure and high cholesterol. Age. The risk increases with age. Stress. What are the signs or symptoms? High blood pressure may not cause symptoms. Very high blood pressure (hypertensive crisis) may cause: Headache. Fast or uneven heartbeats (palpitations). Shortness of breath. Nosebleed. Vomiting or feeling like you may vomit (nauseous). Changes in how you see. Very bad chest pain. Feeling dizzy. Seizures. How is this treated? This condition is treated by making healthy lifestyle changes, such as: Eating healthy foods. Exercising more. Drinking less alcohol. Your doctor may prescribe medicine if lifestyle changes do not help enough and if: Your top number is above 130. Your bottom number is above 80. Your personal target blood pressure may vary. Follow these instructions at home: Eating and drinking  If told, follow the DASH eating plan. To follow this plan: Fill one half of your plate at each meal with fruits and vegetables. Fill one fourth of your plate  at each meal with whole grains. Whole grains include whole-wheat pasta, brown rice, and whole-grain bread. Eat or drink low-fat dairy products, such as skim milk or low-fat yogurt. Fill one fourth of your plate at each meal with low-fat (lean) proteins. Low-fat proteins include fish, chicken without skin, eggs, beans, and tofu. Avoid fatty meat, cured and processed meat, or chicken with skin. Avoid pre-made or processed food. Limit the amount of salt in your diet to less than 1,500 mg each day. Do not drink alcohol if: Your doctor tells you not to drink. You are pregnant, may be pregnant, or are planning to become pregnant. If you drink alcohol: Limit how much you have to: 0-1 drink a day for women. 0-2 drinks a day for men. Know how much alcohol is in your drink. In the [place] one drink equals one 12 oz bottle of beer (355 mL), one 5 oz glass of wine (148 mL), or one 1 oz glass of hard liquor (44 mL). Lifestyle  Work with your doctor to stay at a healthy weight or to lose weight. Ask your doctor what the best weight is for you. Get at least 30 minutes of exercise that causes your heart to beat faster (aerobic exercise) most days of the week. This may include walking, swimming, or biking. Get at least 30 minutes of exercise that strengthens your muscles (resistance exercise) at least 3 days a week. This may include lifting weights or doing Pilates. Do not smoke or use any products that contain nicotine or tobacco. If you need help quitting, ask your doctor. Check your blood pressure at home as told by your doctor. Keep all follow-up visits. Medicines Take over-the-counter and prescription medicines   only as told by your doctor. Follow directions carefully. Do not skip doses of blood pressure medicine. The medicine does not work as well if you skip doses. Skipping doses also puts you at risk for problems. Ask your doctor about side effects or reactions to medicines that you should watch  for. Contact a doctor if: You think you are having a reaction to the medicine you are taking. You have headaches that keep coming back. You feel dizzy. You have swelling in your ankles. You have trouble with your vision. Get help right away if: You get a very bad headache. You start to feel mixed up (confused). You feel weak or numb. You feel faint. You have very bad pain in your: Chest. Belly (abdomen). You vomit more than once. You have trouble breathing. These symptoms may be an emergency. Get help right away. Call 911. Do not wait to see if the symptoms will go away. Do not drive yourself to the hospital. Summary Hypertension is another name for high blood pressure. High blood pressure forces your heart to work harder to pump blood. For most people, a normal blood pressure is less than 120/80. Making healthy choices can help lower blood pressure. If your blood pressure does not get lower with healthy choices, you may need to take medicine. This information is not intended to replace advice given to you by your health care provider. Make sure you discuss any questions you have with your health care provider. Document Revised: 10/26/2020 Document Reviewed: 10/26/2020 Elsevier Patient Education  2023 Elsevier Inc.  

## 2022-03-28 NOTE — Progress Notes (Signed)
I,Victoria T Hamilton,acting as a scribe for Maximino Greenland, MD.,have documented all relevant documentation on the behalf of Maximino Greenland, MD,as directed by  Maximino Greenland, MD while in the presence of Maximino Greenland, MD.    Subjective:     Patient ID: Whitney Henderson , female    DOB: Sep 21, 1928 , 89 y.o.   MRN: [id number]   Chief Complaint  Patient presents with   Hypertension    HPI  She presents for BP check. She reports compliance with meds. She is accompanied by her daughter, V. Today. She offers no concerns. Patient denies headaches, chest pain and shortness of breath. She states her bp was 128/71 yesterday, average reading is 120/84. She keeps BP log, she checks her BP daily.     Hypertension This is a chronic problem. The current episode started more than 1 year ago. The problem has been gradually improving since onset. The problem is uncontrolled. Pertinent negatives include no blurred vision, chest pain, headaches, neck pain, orthopnea, palpitations or shortness of breath. The current treatment provides moderate improvement. Compliance problems include exercise.      Past Medical History:  Diagnosis Date   Anxiety    Cataracts, bilateral    Discoid lupus    Headache 04/27/2015   Left>right periorbital   Hypertension    Memory difficulties 09/04/2016     Family History  Problem Relation Age of Onset   Early death Mother    Cancer Father    Breast cancer Daughter        22s     Current Outpatient Medications:    acetaminophen (TYLENOL) 500 MG tablet, Take 500 mg by mouth every 6 (six) hours as needed for mild pain or moderate pain., Disp: , Rfl:    Apoaequorin (PREVAGEN PO), Take 1 capsule by mouth every other day., Disp: , Rfl:    betamethasone dipropionate (DIPROLENE) 0.05 % cream, APPLY THIN LAYER EXTERNALLY TO THE AFFECTED AREA EVERY DAY AS NEEDED (Patient taking differently: 1 application  daily as needed (skin irritation).), Disp: 60 g, Rfl: 2   Calcium  Carb-Cholecalciferol (CALCIUM 500 +D PO), Take 1 tablet by mouth every other day., Disp: , Rfl:    Multiple Vitamins-Minerals (CENTRUM SILVER PO), Take 1 tablet by mouth every other day., Disp: , Rfl:    Propylene Glycol (SYSTANE BALANCE OP), Place 1 drop into both eyes daily as needed (for itchy eyes)., Disp: , Rfl:    sertraline (ZOLOFT) 25 MG tablet, TAKE 1 TABLET(25 MG) BY MOUTH DAILY (Patient taking differently: Take 25 mg by mouth at bedtime.), Disp: 90 tablet, Rfl: 1   thiamine 100 MG tablet, Take 100 mg by mouth daily., Disp: , Rfl:    triamcinolone cream (KENALOG) 0.1 %, APPLY TO AFFECTED AREA TWICE DAILY AS NEEDED (Patient taking differently: Apply 1 Application topically 2 (two) times daily as needed (for flares).), Disp: 45 g, Rfl: 0   vitamin C (ASCORBIC ACID) 500 MG tablet, Take 500 mg by mouth every other day., Disp: , Rfl:    amLODipine (NORVASC) 2.5 MG tablet, Take 1 tablet (2.5 mg total) by mouth 2 (two) times daily. One tab po twice daily with breakfast and dinner, Disp: 180 tablet, Rfl: 1   aspirin 81 MG chewable tablet, Chew 1 tablet (81 mg total) by mouth 2 (two) times daily. (Patient not taking: Reported on 03/28/2022), Disp: 30 tablet, Rfl: 0   Allergies  Allergen Reactions   Fish Allergy Other (See Comments)  Does not do well with Seafood.   Codeine Rash     Review of Systems  Constitutional: Negative.   Eyes:  Negative for blurred vision.  Respiratory: Negative.  Negative for shortness of breath.   Cardiovascular: Negative.  Negative for chest pain, palpitations and orthopnea.  Gastrointestinal: Negative.  Negative for abdominal distention.  Musculoskeletal:  Negative for neck pain.  Neurological: Negative.  Negative for headaches.  Psychiatric/Behavioral: Negative.       Today's Vitals   03/28/22 1438  BP: 136/88  Pulse: 78  Temp: 97.7 F (36.5 C)  SpO2: 98%  Weight: 116 lb 12.8 oz (53 kg)  Height: 5' (1.524 m)   Body mass index is 22.81 kg/m.  Wt  Readings from Last 3 Encounters:  03/28/22 116 lb 12.8 oz (53 kg)  11/28/21 112 lb (50.8 kg)  11/03/21 120 lb (54.4 kg)    Objective:  Physical Exam Vitals and nursing note reviewed.  Constitutional:      Appearance: Normal appearance.  HENT:     Head: Normocephalic and atraumatic.     Nose:     Comments: Masked     Mouth/Throat:     Comments: Masked  Eyes:     Extraocular Movements: Extraocular movements intact.  Cardiovascular:     Rate and Rhythm: Normal rate and regular rhythm.     Heart sounds: Normal heart sounds.  Pulmonary:     Effort: Pulmonary effort is normal.     Breath sounds: Normal breath sounds.  Musculoskeletal:     Cervical back: Normal range of motion.     Comments: Ambulatory w/ cane  Skin:    General: Skin is warm.  Neurological:     General: No focal deficit present.     Mental Status: She is alert.  Psychiatric:        Mood and Affect: Mood normal.        Behavior: Behavior normal.         Assessment And Plan:     1. Parenchymal renal hypertension, stage 1 through stage 4 or unspecified chronic kidney disease Comments: Chronic, fair control. She will c/w amlodipine 2.'5mg'$  twice daily.  She is encouraged to follow low sodium diet. She will f/u in 4-6 months for re-evaluation. - CMP14+EGFR - CBC - Lipid panel - amLODipine (NORVASC) 2.5 MG tablet; Take 1 tablet (2.5 mg total) by mouth 2 (two) times daily. One tab po twice daily with breakfast and dinner  Dispense: 180 tablet; Refill: 1  2. Stage 3b chronic kidney disease ([place]) Comments: Chronic, she is reminded to avoid NSAIDs, stay well hydrated and keep BP well controlled to decrease risk of CKD progression. - PTH, intact and calcium - Phosphorus - Protein electrophoresis, serum  3. Localized osteoporosis, unspecified pathological fracture presence Comments: Chronic, she is s/p hip fracture. Previously, she was not interested in therapy. Now, she agrees to Rheum referral for treatment. -  Ambulatory referral to Rheumatology  4. Gait abnormality Comments: She is ambulatory w/ cane. She confirms there are no throw rugs in her home.  5. MCI (mild cognitive impairment) with memory loss Comments: Chronic, stable. She does not wish to take pharmacologic therapy at this time.  6. Anxiety Comments: Chronic, stable.  7. Grief Comments: She is still grieving the loss of her husband, he passed more than a year ago. She declines grief therapy at this time.  8. Body mass index (BMI) of 22.0-22.9 in adult Comments: She has gained 4 lbs in the past  several months.   Patient was given opportunity to ask questions. Patient verbalized understanding of the plan and was able to repeat key elements of the plan. All questions were answered to their satisfaction.   I, Maximino Greenland, MD, have reviewed all documentation for this visit. The documentation on 03/28/22 for the exam, diagnosis, procedures, and orders are all accurate and complete.   IF YOU HAVE BEEN REFERRED TO A SPECIALIST, IT MAY TAKE 1-2 WEEKS TO SCHEDULE/PROCESS THE REFERRAL. IF YOU HAVE NOT HEARD FROM US/SPECIALIST IN TWO WEEKS, PLEASE GIVE [place] A CALL AT [phone].   THE PATIENT IS ENCOURAGED TO PRACTICE SOCIAL DISTANCING DUE TO THE COVID-19 PANDEMIC.

## 2022-04-01 LAB — CBC
Hematocrit: 32.8 % — ABNORMAL LOW (ref 34.0–46.6)
Hemoglobin: 10.8 g/dL — ABNORMAL LOW (ref 11.1–15.9)
MCH: 27.3 pg (ref 26.6–33.0)
MCHC: 32.9 g/dL (ref 31.5–35.7)
MCV: 83 fL (ref 79–97)
Platelets: 187 10*3/uL (ref 150–450)
RBC: 3.95 x10E6/uL (ref 3.77–5.28)
RDW: 14.5 % (ref 11.7–15.4)
WBC: 3.6 10*3/uL (ref 3.4–10.8)

## 2022-04-01 LAB — PROTEIN ELECTROPHORESIS, SERUM
A/G Ratio: 1 (ref 0.7–1.7)
Albumin ELP: 3.6 g/dL (ref 2.9–4.4)
Alpha 1: 0.3 g/dL (ref 0.0–0.4)
Alpha 2: 0.9 g/dL (ref 0.4–1.0)
Beta: 1.1 g/dL (ref 0.7–1.3)
Gamma Globulin: 1.5 g/dL (ref 0.4–1.8)
Globulin, Total: 3.7 g/dL (ref 2.2–3.9)

## 2022-04-01 LAB — CMP14+EGFR
ALT: 14 IU/L (ref 0–32)
AST: 21 IU/L (ref 0–40)
Albumin/Globulin Ratio: 1.1 — ABNORMAL LOW (ref 1.2–2.2)
Albumin: 3.9 g/dL (ref 3.6–4.6)
Alkaline Phosphatase: 81 IU/L (ref 44–121)
BUN/Creatinine Ratio: 25 (ref 12–28)
BUN: 35 mg/dL (ref 10–36)
Bilirubin Total: 0.3 mg/dL (ref 0.0–1.2)
CO2: 22 mmol/L (ref 20–29)
Calcium: 9.4 mg/dL (ref 8.7–10.3)
Chloride: 105 mmol/L (ref 96–106)
Creatinine, Ser: 1.4 mg/dL — ABNORMAL HIGH (ref 0.57–1.00)
Globulin, Total: 3.4 g/dL (ref 1.5–4.5)
Glucose: 92 mg/dL (ref 70–99)
Potassium: 4.6 mmol/L (ref 3.5–5.2)
Sodium: 141 mmol/L (ref 134–144)
Total Protein: 7.3 g/dL (ref 6.0–8.5)
eGFR: 35 mL/min/{1.73_m2} — ABNORMAL LOW (ref >59)

## 2022-04-01 LAB — LIPID PANEL
Chol/HDL Ratio: 2.2 ratio (ref 0.0–4.4)
Cholesterol, Total: 206 mg/dL — ABNORMAL HIGH (ref 100–199)
HDL: 92 mg/dL (ref >39)
LDL Chol Calc (NIH): 104 mg/dL — ABNORMAL HIGH (ref 0–99)
Triglycerides: 54 mg/dL (ref 0–149)
VLDL Cholesterol Cal: 10 mg/dL (ref 5–40)

## 2022-04-01 LAB — PTH, INTACT AND CALCIUM: PTH: 28 pg/mL (ref 15–65)

## 2022-04-01 LAB — PHOSPHORUS: Phosphorus: 3.7 mg/dL (ref 3.0–4.3)

## 2022-04-03 ENCOUNTER — Ambulatory Visit: Payer: Self-pay

## 2022-04-03 NOTE — Patient Outreach (Signed)
  Care Coordination   Follow Up Visit Note   04/03/2022 Name: Whitney Henderson MRN: [id number] DOB: [birth year]  Whitney Henderson is a 89 y.o. year old female who sees [name], MD for primary care. I spoke with  Whitney Henderson by phone today.  What matters to the patients health and wellness today?  Patient increase  her water intake to 48-64 oz daily unless otherwise directed. Patient will take a urine sample to her PCP office.     Goals Addressed             This Visit's Progress    I am worried about mother having another UTI       Care Coordination Interventions: Assessed the Patient understanding of chronic kidney disease    Evaluation of current treatment plan related to chronic kidney disease self management and patient's adherence to plan as established by provider      Reviewed prescribed diet increase water to 48-64 oz daily unless otherwise directed Advised patient to discuss symptoms suggestive of UTI with provider    Provided education on kidney disease progression    Collaborated with PCP Dr. Baird Cancer regarding patient's symptoms suggestive of UTI Determined patient will drop off a urine sample to PCP next week Educated patient and daughter Whitney Henderson about the risk for worsening symptoms resulting in severe illness and or hospitalization if diagnosis and or treatment is delayed       Interventions Today    Flowsheet Row Most Recent Value  Chronic Disease   Chronic disease during today's visit Chronic Kidney Disease/End Stage Renal Disease (ESRD), Other  [UTI]  General Interventions   General Interventions Discussed/Reviewed General Interventions Discussed, General Interventions Reviewed, Labs  Labs Kidney Function  Communication with PCP/Specialists  Education Interventions   Education Provided Provided Education  Provided Verbal Education On Labs, When to see the doctor  Nutrition Interventions   Nutrition Discussed/Reviewed Fluid intake          SDOH  assessments and interventions completed:  No     Care Coordination Interventions:  Yes, provided   Follow up plan: Follow up call scheduled for 05/01/22 @12 :30 PM    Encounter Outcome:  Pt. Visit Completed

## 2022-04-03 NOTE — Patient Instructions (Signed)
Visit Information  Thank you for taking time to visit with me today. Please don't hesitate to contact me if I can be of assistance to you.   Following are the goals we discussed today:   Goals Addressed             This Visit's Progress    I am worried about mother having another UTI       Care Coordination Interventions: Assessed the Patient understanding of chronic kidney disease    Evaluation of current treatment plan related to chronic kidney disease self management and patient's adherence to plan as established by provider      Reviewed prescribed diet increase water to 48-64 oz daily unless otherwise directed Advised patient to discuss symptoms suggestive of UTI with provider    Provided education on kidney disease progression    Collaborated with PCP Dr. Baird Cancer regarding patient's symptoms suggestive of UTI Determined patient will drop off a urine sample to PCP next week Educated patient and daughter [name] about the risk for worsening symptoms resulting in severe illness and or hospitalization if diagnosis and or treatment is delayed           Our next appointment is by telephone on 05/01/22 at 12:30 PM  Please call the care guide team at [phone] if you need to cancel or reschedule your appointment.   If you are experiencing a Mental Health or [place] or need someone to talk to, please call 1-800-273-TALK (toll free, 24 hour hotline) go to [facility] [place] [phone])  The patient verbalized understanding of instructions, educational materials, and care plan provided today and DECLINED offer to receive copy of patient instructions, educational materials, and care plan.   Barb Merino, RN, BSN, CCM Care Management Coordinator [facility] Care Management  Direct Phone: [phone]

## 2022-04-24 ENCOUNTER — Other Ambulatory Visit: Payer: Self-pay | Admitting: Internal Medicine

## 2022-04-24 ENCOUNTER — Encounter: Payer: Self-pay | Admitting: Orthopaedic Surgery

## 2022-04-24 ENCOUNTER — Other Ambulatory Visit (INDEPENDENT_AMBULATORY_CARE_PROVIDER_SITE_OTHER): Payer: Medicare Other

## 2022-04-24 ENCOUNTER — Ambulatory Visit: Payer: Medicare Other | Admitting: Orthopaedic Surgery

## 2022-04-24 DIAGNOSIS — Z96649 Presence of unspecified artificial hip joint: Secondary | ICD-10-CM

## 2022-04-24 DIAGNOSIS — R2689 Other abnormalities of gait and mobility: Secondary | ICD-10-CM | POA: Diagnosis not present

## 2022-04-24 DIAGNOSIS — Z96642 Presence of left artificial hip joint: Secondary | ICD-10-CM | POA: Diagnosis not present

## 2022-04-24 DIAGNOSIS — S72002D Fracture of unspecified part of neck of left femur, subsequent encounter for closed fracture with routine healing: Secondary | ICD-10-CM | POA: Diagnosis not present

## 2022-04-24 DIAGNOSIS — M6281 Muscle weakness (generalized): Secondary | ICD-10-CM | POA: Diagnosis not present

## 2022-04-24 DIAGNOSIS — Z9181 History of falling: Secondary | ICD-10-CM | POA: Diagnosis not present

## 2022-04-24 NOTE — Progress Notes (Signed)
The patient is a 89 year old female who is now over 5 months status post a left hip hemiarthroplasty that was performed secondary to a displaced femoral neck fracture.  She is ambulate with a cane.  States she is doing well.  She says she is come along fine but does have some left hip pain and swelling from time to time.  On exam I can easily put her left hip through range of motion.  She has just a touch with pain.  An AP pelvis and lateral of the left hip shows a well-seated hemiarthroplasty with no complicating features.  At this point follow-up can be as needed.  She can try any type of topical anti-inflammatories including Biofreeze or Aspercreme or even salon patches.  This should continue to improve with time.  If she does have issues she will let us know and can always come back for follow-up.

## 2022-04-25 DIAGNOSIS — H353131 Nonexudative age-related macular degeneration, bilateral, early dry stage: Secondary | ICD-10-CM | POA: Diagnosis not present

## 2022-04-25 DIAGNOSIS — H35033 Hypertensive retinopathy, bilateral: Secondary | ICD-10-CM | POA: Diagnosis not present

## 2022-04-25 DIAGNOSIS — H35013 Changes in retinal vascular appearance, bilateral: Secondary | ICD-10-CM | POA: Diagnosis not present

## 2022-04-25 DIAGNOSIS — H524 Presbyopia: Secondary | ICD-10-CM | POA: Diagnosis not present

## 2022-04-25 DIAGNOSIS — H40013 Open angle with borderline findings, low risk, bilateral: Secondary | ICD-10-CM | POA: Diagnosis not present

## 2022-05-01 ENCOUNTER — Ambulatory Visit: Payer: Self-pay

## 2022-05-01 NOTE — Patient Instructions (Signed)
Visit Information  Thank you for taking time to visit with me today. Please don't hesitate to contact me if I can be of assistance to you.   Following are the goals we discussed today:   Goals Addressed             This Visit's Progress    I am worried about mother having another UTI       Care Coordination Interventions: Review of patient status, including review of consultant's reports, relevant laboratory and other test results Re-educated patient and daughter regarding the importance to increase her daily intake of water to 48-64 oz daily unless otherwise directed Educated patient and daughter about the importance of reporting symptoms suggestive of UTI to PCP promptly for early treatment    Provided education on kidney disease progression    Mailed printed educational materials related to Eating Right with Chronic Kidney disease       COMPLETED: To continue to work on strengthening her hip and leg       Care Coordination Interventions: Evaluation of current treatment plan related to impaired physical mobility and patient's adherence to plan as established by provider Discussed and reviewed with patient and daughter [name], recent Orthopedic follow up with Dr. Magnus Ivan for evaluation of S/P hip hemiarthroplasty   Review of patient status, including review of consultant's reports, relevant laboratory and other test results Determined patient has made a full recovery from her hip fracture and will contact Dr. Magnus Ivan in the future if needed        Our next appointment is by telephone on 07/01/22 at 1:00 PM  Please call the care guide team at [phone] if you need to cancel or reschedule your appointment.   If you are experiencing a Mental Health or Behavioral Health Crisis or need someone to talk to, please call 1-800-273-TALK (toll free, 24 hour hotline) go to [facility] [address], [place] [phone])  The patient verbalized  understanding of instructions, educational materials, and care plan provided today and DECLINED offer to receive copy of patient instructions, educational materials, and care plan.   Delsa Sale, RN, BSN, CCM Care Management Coordinator [facility] Care Management  Direct Phone: [phone]

## 2022-05-01 NOTE — Patient Outreach (Signed)
  Care Coordination   Follow Up Visit Note   05/01/2022 Name: Whitney Henderson MRN: [id number] DOB: 07-24-1928  Whitney Henderson is a 89 y.o. year old female who sees Whitney Peng, MD for primary care. I spoke with Whitney Henderson and daughter Whitney Henderson by phone today.  What matters to the patients health and wellness today?  Patient will continue to increase her water intake and learn how to eat right with chronic kidney disease.     Goals Addressed             This Visit's Progress    I am worried about mother having another UTI       Care Coordination Interventions: Review of patient status, including review of consultant's reports, relevant laboratory and other test results Re-educated patient and daughter regarding the importance to increase her daily intake of water to 48-64 oz daily unless otherwise directed Educated patient and daughter about the importance of reporting symptoms suggestive of UTI to PCP promptly for early treatment    Provided education on kidney disease progression    Mailed printed educational materials related to Eating Right with Chronic Kidney disease       COMPLETED: To continue to work on strengthening her hip and leg       Care Coordination Interventions: Evaluation of current treatment plan related to impaired physical mobility and patient's adherence to plan as established by provider Discussed and reviewed with patient and daughter Whitney Henderson, recent Orthopedic follow up with Dr. Magnus Henderson for evaluation of S/P hip hemiarthroplasty   Review of patient status, including review of consultant's reports, relevant laboratory and other test results Determined patient has made a full recovery from her hip fracture and will contact Dr. Magnus Henderson in the future if needed    Interventions Today    Flowsheet Row Most Recent Value  Chronic Disease   Chronic disease during today's visit Chronic Kidney Disease/End Stage Renal Disease (ESRD)  General  Interventions   General Interventions Discussed/Reviewed General Interventions Discussed, General Interventions Reviewed, Labs, Doctor Visits, Durable Medical Equipment (DME)  Labs Kidney Function  Doctor Visits Discussed/Reviewed Doctor Visits Discussed, Doctor Visits Reviewed, PCP  Durable Medical Equipment (DME) Whitney Henderson, Other  [cane]  Education Interventions   Education Provided Provided Education, Provided Printed Education  Provided Verbal Education On Nutrition, Labs, When to see the doctor  Labs Reviewed Kidney Function  Nutrition Interventions   Nutrition Discussed/Reviewed Nutrition Discussed, Nutrition Reviewed, Fluid intake  Safety Interventions   Safety Discussed/Reviewed Safety Discussed, Safety Reviewed, Fall Risk, Home Safety  Home Safety Assistive Devices          SDOH assessments and interventions completed:  No     Care Coordination Interventions:  Yes, provided   Follow up plan: Follow up call scheduled for 07/01/22 @1 :00 PM     Encounter Outcome:  Pt. Visit Completed

## 2022-05-09 NOTE — Progress Notes (Signed)
Office Visit Note  Patient: Whitney Henderson             Date of Birth: Jun 04, 1928           MRN: [id number]             PCP: [name], MD Referring: [name], MD Visit Date: 05/23/2022 Occupation: @GUAROCC @  Subjective:  Discuss osteoporosis treatment options  History of Present Illness: Whitney Henderson is a 89 y.o. female who presents today for a new patient consultation as requested by her PCP.  Patient had an updated bone density on 02/28/2022 and was referred to [place] to discuss treatment options.  She has been taking a calcium and vitamin D supplement.  Patient has had recurrent falls in the past requiring physical therapy to help improve her balance.  She has been using a cane most days and occasionally will use a walker at home.  She has been trying to walk daily with her daughter and has been performing range of motion exercises at home.  She had a left hip replacement after a fall in October 2023.   Activities of Daily Living:  Patient reports morning stiffness for 0 minutes.   Patient Denies nocturnal pain.  Difficulty dressing/grooming: Reports Difficulty climbing stairs: Reports Difficulty getting out of chair: Reports Difficulty using hands for taps, buttons, cutlery, and/or writing: Reports  Review of Systems  Constitutional:  Positive for fatigue.  HENT:  Positive for mouth dryness. Negative for mouth sores and nose dryness.   Eyes:  Positive for dryness. Negative for pain and visual disturbance.  Respiratory:  Negative for cough, hemoptysis, shortness of breath and difficulty breathing.   Cardiovascular:  Negative for chest pain, palpitations, hypertension and swelling in legs/feet.  Gastrointestinal:  Negative for blood in stool, constipation and diarrhea.  Endocrine: Negative for increased urination.  Genitourinary:  Positive for involuntary urination. Negative for painful urination.  Musculoskeletal:  Positive for joint pain, gait problem, joint pain,  myalgias, muscle weakness, muscle tenderness and myalgias. Negative for joint swelling and morning stiffness.  Skin:  Positive for sensitivity to sunlight. Negative for color change, pallor, rash, hair loss, nodules/bumps, skin tightness and ulcers.  Allergic/Immunologic: Negative for susceptible to infections.  Neurological:  Negative for dizziness, numbness, headaches and weakness.  Hematological:  Negative for swollen glands.  Psychiatric/Behavioral:  Positive for depressed mood. Negative for sleep disturbance. The patient is nervous/anxious.     PMFS History:  Patient Active Problem List   Diagnosis Date Noted   Migraine 01/06/2022   MCI (mild cognitive impairment) with memory loss 01/06/2022   Personal history of tobacco use, presenting hazards to health 01/06/2022   Encounter for long-term (current) aspirin use 01/06/2022   Urinary frequency 12/31/2021   Hip fracture requiring operative repair (HCC) 11/03/2021   Closed fracture of neck of left femur (HCC) 11/02/2021   Fall at home, initial encounter 11/02/2021   Normocytic anemia 11/02/2021   Tremor 01/06/2020   Age-related osteoporosis with current pathological fracture 11/05/2018   Grief reaction 03/31/2018   Hypertensive nephropathy 03/03/2018   Chronic renal disease, stage III (HCC) 12/11/2017   Hypertension, essential, benign 10/30/2017   Anxiety 10/11/2017   Memory disorder 09/04/2016   Headache 04/27/2015   Hypertension 01/16/2013   UTI (lower urinary tract infection) 01/16/2013    Past Medical History:  Diagnosis Date   Anxiety    Cataracts, bilateral    Discoid lupus    Headache 04/27/2015   Left>right periorbital   Hypertension  Memory difficulties 09/04/2016    Family History  Problem Relation Age of Onset   Early death Mother    Cancer Father    Thyroid disease Daughter    Breast cancer Daughter        12s   Glaucoma Daughter    Hypertension Daughter    Heart attack Son    Thyroid disease Son     Past Surgical History:  Procedure Laterality Date   ABDOMINAL HYSTERECTOMY     BREAST EXCISIONAL BIOPSY Left over 20 ys ago   benign   CHOLECYSTECTOMY     EXCISIONAL HEMORRHOIDECTOMY     TOTAL HIP ARTHROPLASTY Left 11/03/2021   Procedure: LEFT HEMI HIP ARTHROPLASTY ANTERIOR APPROACH;  Surgeon: Kathryne Hitch, MD;  Location: [facility] OR;  Service: Orthopedics;  Laterality: Left;   Social History   Social History Narrative   Live with husband,  4 children.    Caffeine: 1 cup daily    Immunization History  Administered Date(s) Administered   Fluad Quad(high Dose 65+) 09/25/2018   Influenza, High Dose Seasonal PF 10/02/2016, 10/14/2019   Influenza-Unspecified 10/22/2017, 10/01/2021   PFIZER(Purple Top)SARS-COV-2 Vaccination 02/09/2019, 03/01/2019, 12/09/2019, 06/20/2020   Pneumococcal Conjugate-13 11/28/2015   Pneumococcal Polysaccharide-23 12/04/2016   Tdap 12/20/2020   Zoster Recombinat (Shingrix) 09/25/2018, 12/04/2018     Objective: Vital Signs: BP (!) 187/78 (BP Location: Left Arm, Patient Position: Sitting, Cuff Size: Normal)   Pulse (!) 54   Resp 12   Ht 5' (1.524 m)   Wt 116 lb 6.4 oz (52.8 kg)   BMI 22.73 kg/m    Physical Exam Vitals and nursing note reviewed.  Constitutional:      Appearance: She is well-developed.  HENT:     Head: Normocephalic and atraumatic.  Eyes:     Conjunctiva/sclera: Conjunctivae normal.  Cardiovascular:     Rate and Rhythm: Normal rate and regular rhythm.     Heart sounds: Normal heart sounds.  Pulmonary:     Effort: Pulmonary effort is normal.     Breath sounds: Normal breath sounds.  Abdominal:     General: Bowel sounds are normal.     Palpations: Abdomen is soft.  Musculoskeletal:     Cervical back: Normal range of motion.  Lymphadenopathy:     Cervical: No cervical adenopathy.  Skin:    General: Skin is warm and dry.     Capillary Refill: Capillary refill takes less than 2 seconds.  Neurological:     Mental  Status: She is alert and oriented to person, place, and time.  Psychiatric:        Behavior: Behavior normal.      Musculoskeletal Exam: C-spine has good range of motion.  Some postural thoracic kyphosis.  No midline spinal tenderness.  No SI joint tenderness.  Shoulder joints, elbow joints, wrist joints, MCPs, PIPs, DIPs have good range of motion with no synovitis.  MCP joint thickening noted bilaterally.  PIP and DIP thickening.  Right hip joint has good range of motion.  Left hip replacement has slightly limited range of motion.  Knee joints have good range of motion with no warmth or effusion.  Ankle joints have good range of motion with no tenderness or joint swelling.  CDAI Exam: CDAI Score: -- Patient Global: --; Provider Global: -- Swollen: --; Tender: -- Joint Exam 05/23/2022   No joint exam has been documented for this visit   There is currently no information documented on the homunculus. Go to the Rheumatology activity and  complete the homunculus joint exam.  Investigation: No additional findings.  Imaging: XR HIP UNILAT W OR W/O PELVIS 1V LEFT  Result Date: 04/24/2022 An AP pelvis and lateral left hip shows a left hip hemiarthroplasty with no complicating features.   Recent Labs: Lab Results  Component Value Date   WBC 3.6 03/28/2022   HGB 10.8 (L) 03/28/2022   PLT 187 03/28/2022   NA 141 03/28/2022   K 4.6 03/28/2022   CL 105 03/28/2022   CO2 22 03/28/2022   GLUCOSE 92 03/28/2022   BUN 35 03/28/2022   CREATININE 1.40 (H) 03/28/2022   BILITOT 0.3 03/28/2022   ALKPHOS 81 03/28/2022   AST 21 03/28/2022   ALT 14 03/28/2022   PROT 7.3 03/28/2022   ALBUMIN 3.9 03/28/2022   CALCIUM 9.4 03/28/2022   GFRAA 40 (L) 02/28/2020    Speciality Comments: No specialty comments available.  Procedures:  No procedures performed Allergies: Fish allergy and Codeine   Assessment / Plan:     Visit Diagnoses: Age-related osteoporosis without current pathological fracture -  Previous DEXA: The BMD measured at Femur Total Right is 0.649 g/cm2 with a T-score of -2.8. Updated DEXA 02/28/22:BMD measured at Femur Total is 0.528 g/cm2 with a T-score of -3.8.   History of hysterectomy and estrogen deficiency.  History of recurrent falls.  Left hip fracture after a fall requiring hemiarthroplasty performed on 11/03/2021.  Using a cane to assist with ambulation and a walker at home.  Patient has completed physical therapy.  She declined a referral for fall prevention and lower extremity strengthening at this time.  She has been walking on a daily basis and has been trying to perform stretching exercises daily with her daughter.  She has been taking a calcium and vitamin D supplement daily.   Reviewed DEXA results with the patient and her daughter today in the office and all questions were addressed.  Different treatment options were discussed today in detail.  The patient and her daughter agreed to proceed with Evenity if approved by insurance.  Indications, contraindications, and potential side effects including the FDA blackbox warning were discussed today in detail.  All questions were addressed.  Plan to apply for Evenity through her insurance. Lab work from 03/28/2022 was reviewed today in the office: SPEP did not reveal any M spike, PTH 28, phosphorus 3.7, calcium 9.4, creatinine 1.40 and GFR 35. Vitamin D and BMP with GFR will be obtained today prior to initiating Evenity.  She will follow-up in the office in 2 to 3 months to assess her tolerability.  Patient is aware that she will be due for her next bone density in February 2026.  - Plan: VITAMIN D 25 Hydroxy (Vit-D Deficiency, Fractures)  Closed fracture of neck of left femur with routine healing, subsequent encounter - hemiarthroplasty performed on 11/03/21 by Dr. Magnus Ivan.  Slightly limited range of motion but no discomfort at this time.  Using a cane to assist with ambulation and a walker at home.  Vitamin D deficiency  -Vitamin D level will be checked today.  Plan: VITAMIN D 25 Hydroxy (Vit-D Deficiency, Fractures)  Medication monitoring encounter -Plan to proceed with applying for Evenity through her insurance.  BMP with GFR and vitamin D will be checked today.  Lab work from 03/28/2022 was reviewed today in the office: SPEP did not reveal any M spike, PTH 28, phosphorus 3.7, calcium 9.4, creatinine 1.40 and GFR 35.Plan: VITAMIN D 25 Hydroxy (Vit-D Deficiency, Fractures), BASIC METABOLIC PANEL WITH GFR  Other medical conditions are listed as follows:   Stage 3b chronic kidney disease (HCC)  Parenchymal renal hypertension, stage 1 through stage 4 or unspecified chronic kidney disease  Hypertension, essential, benign  MCI (mild cognitive impairment) with memory loss  Hypertensive nephropathy  Normocytic anemia  Personal history of tobacco use, presenting hazards to health   Orders: Orders Placed This Encounter  Procedures   VITAMIN D 25 Hydroxy (Vit-D Deficiency, Fractures)   BASIC METABOLIC PANEL WITH GFR   No orders of the defined types were placed in this encounter.  .  Follow-Up Instructions: Return in about 3 months (around 08/23/2022) for Osteoporosis.   Gearldine Bienenstock, PA-C  Note - This record has been created using Dragon software.  Chart creation errors have been sought, but may not always  have been located. Such creation errors do not reflect on  the standard of medical care.

## 2022-05-14 ENCOUNTER — Ambulatory Visit: Payer: Medicare Other | Admitting: Neurology

## 2022-05-23 ENCOUNTER — Telehealth: Payer: Self-pay | Admitting: Pharmacist

## 2022-05-23 ENCOUNTER — Ambulatory Visit: Payer: Medicare Other | Attending: Physician Assistant | Admitting: Physician Assistant

## 2022-05-23 ENCOUNTER — Encounter: Payer: Self-pay | Admitting: Physician Assistant

## 2022-05-23 VITALS — BP 187/78 | HR 54 | Resp 12 | Ht 60.0 in | Wt 116.4 lb

## 2022-05-23 DIAGNOSIS — N1832 Chronic kidney disease, stage 3b: Secondary | ICD-10-CM | POA: Diagnosis not present

## 2022-05-23 DIAGNOSIS — I129 Hypertensive chronic kidney disease with stage 1 through stage 4 chronic kidney disease, or unspecified chronic kidney disease: Secondary | ICD-10-CM | POA: Diagnosis not present

## 2022-05-23 DIAGNOSIS — S72002D Fracture of unspecified part of neck of left femur, subsequent encounter for closed fracture with routine healing: Secondary | ICD-10-CM | POA: Diagnosis not present

## 2022-05-23 DIAGNOSIS — E559 Vitamin D deficiency, unspecified: Secondary | ICD-10-CM | POA: Diagnosis not present

## 2022-05-23 DIAGNOSIS — Z87891 Personal history of nicotine dependence: Secondary | ICD-10-CM | POA: Diagnosis not present

## 2022-05-23 DIAGNOSIS — G3184 Mild cognitive impairment, so stated: Secondary | ICD-10-CM

## 2022-05-23 DIAGNOSIS — I10 Essential (primary) hypertension: Secondary | ICD-10-CM

## 2022-05-23 DIAGNOSIS — D649 Anemia, unspecified: Secondary | ICD-10-CM | POA: Diagnosis not present

## 2022-05-23 DIAGNOSIS — Z5181 Encounter for therapeutic drug level monitoring: Secondary | ICD-10-CM | POA: Diagnosis not present

## 2022-05-23 DIAGNOSIS — M810 Age-related osteoporosis without current pathological fracture: Secondary | ICD-10-CM | POA: Diagnosis not present

## 2022-05-23 NOTE — Telephone Encounter (Signed)
Pending baseline labs from today, patient will be new start to [place]. Will place referral to [facility] once labs have resulted and if wnl  Not a candidate for bisphosphonates due to history of CKD.  Chesley Mires, PharmD, MPH, BCPS, CPP Clinical Pharmacist (Rheumatology and Pulmonology)

## 2022-05-23 NOTE — Patient Instructions (Addendum)
Romosozumab Injection What is this medication? ROMOSOZUMAB (roe moe SOZ ue mab) prevents and treats osteoporosis. It works by making your bones stronger and less likely to break (fracture). It is a monoclonal antibody. This medicine may be used for other purposes; ask your health care provider or pharmacist if you have questions. COMMON BRAND NAME(S): EVENITY What should I tell my care team before I take this medication? They need to know if you have any of these conditions: Dental disease Heart attack Heart disease Kidney problems Low levels of calcium in the blood On dialysis Stroke Wear dentures An unusual or allergic reaction to romosozumab, other medications, foods, dyes or preservatives Pregnant or trying to get pregnant Breast-feeding How should I use this medication? This medication is injected under the skin. It is given by your care team in a hospital or clinic setting. A special MedGuide will be given to you by the pharmacist with each prescription and refill. Be sure to read this information carefully each time. Talk to your care team about the use of this medication in children. Special care may be needed. Overdosage: If you think you have taken too much of this medicine contact a poison control center or emergency room at once. NOTE: This medicine is only for you. Do not share this medicine with others. What if I miss a dose? Keep appointments for follow-up doses. It is important not to miss your dose. Call your care team if you are unable to keep an appointment. What may interact with this medication? Interactions are not expected. This list may not describe all possible interactions. Give your health care provider a list of all the medicines, herbs, non-prescription drugs, or dietary supplements you use. Also tell them if you smoke, drink alcohol, or use illegal drugs. Some items may interact with your medicine. What should I watch for while using this medication? Your  condition will be monitored carefully while you are receiving this medication. You may need bloodwork while taking this medication. You should make sure you get enough calcium and vitamin D while you are taking this medication. Discuss the foods you eat and the vitamins you take with your care team. Some people who take this medication have severe bone, joint, or muscle pain. This medication may also increase your risk for jaw problems or a broken thigh bone. Tell your care team right away if you have severe pain in your jaw, bones, joints, or muscles. Tell you care team if you have any pain that does not go away or that gets worse. Tell your dentist and dental surgeon that you are taking this medication. You should not have major dental surgery while on this medication. See your dentist to have a dental exam and fix any dental problems before starting this medication. Take good care of your teeth while on this medication. Make sure you see your dentist for regular follow-up appointments. What side effects may I notice from receiving this medication? Side effects that you should report to your care team as soon as possible: Allergic reactions or angioedema--skin rash, itching or hives, swelling of the face, eyes, lips, tongue, arms, or legs, trouble swallowing or breathing Heart attack--pain or tightness in the chest, shoulders, arms, or jaw, nausea, shortness of breath, cold or clammy skin, feeling faint or lightheaded Low calcium level--muscle pain or cramps, confusion, tingling, or numbness in the hands or feet Osteonecrosis of the jaw--pain, swelling, or redness in the mouth, numbness of the jaw, poor healing after dental work,   unusual discharge from the mouth, visible bones in the mouth Severe bone, joint, or muscle pain Stroke--sudden numbness or weakness of the face, arm, or leg, trouble speaking, confusion, trouble walking, loss of balance or coordination, dizziness, severe headache, change in  vision Side effects that usually do not require medical attention (report to your care team if they continue or are bothersome): Headache Joint pain Muscle spasms Pain, redness, or irritation at injection site Swelling of the ankles, hands, or feet This list may not describe all possible side effects. Call your doctor for medical advice about side effects. You may report side effects to FDA at 1-800-FDA-1088. Where should I keep my medication? This medication is given in a hospital or clinic. It will not be stored at home. NOTE: This sheet is a summary. It may not cover all possible information. If you have questions about this medicine, talk to your doctor, pharmacist, or health care provider.  2023 Elsevier/Gold Standard (2021-02-07 00:00:00)  

## 2022-05-23 NOTE — Progress Notes (Signed)
Pharmacy Note  Subjective:  Patient presents today to [facility] for follow up office visit.   Patient was seen by the pharmacist for counseling on Evenity.  History of femoral fracture.  History of cardiovascular events in the past 12 months? no  Objective: CMP     Component Value Date/Time   NA 141 03/28/2022 1529   K 4.6 03/28/2022 1529   CL 105 03/28/2022 1529   CO2 22 03/28/2022 1529   GLUCOSE 92 03/28/2022 1529   GLUCOSE 134 (H) 11/06/2021 0306   BUN 35 03/28/2022 1529   CREATININE 1.40 (H) 03/28/2022 1529   CALCIUM 9.4 03/28/2022 1529   PROT 7.3 03/28/2022 1529   ALBUMIN 3.9 03/28/2022 1529   AST 21 03/28/2022 1529   ALT 14 03/28/2022 1529   ALKPHOS 81 03/28/2022 1529   BILITOT 0.3 03/28/2022 1529   GFRNONAA 43 (L) 11/06/2021 0306   GFRAA 40 (L) 02/28/2020 1648    Vitamin D Lab Results  Component Value Date   VD25OH 58.9 02/28/2020   DEXA 02/28/22:BMD measured at Femur Total is 0.528 g/cm2 with a T-score of -3.8.   Assessment/Plan:  Counseled patient on purpose, proper use, and adverse effects of Evenity.  Counseled patient that Sheilah Mins is a medication that must be injected once monthly for 12 months by a healthcare professional.  Advised patient to take calcium 1200 mg daily and vitamin D 800 units daily.  Reviewed the most common adverse effects of Evenity including headache, osteonecrosis of the jaw, and muscle/bone pain.  Reviewed that Sheilah Mins has FDA boxed warning for major adverse cardiovascular events. Reviewed that Evenity may increase the risk of myocardial infarction, stroke, and cardiovascular death. Confirmed that patient has not had MI or stroke within the preceding year  Patient confirms she does not have any major dental work planned at this time.  Reviewed with patient the signs/symptoms of low calcium and advised patient to alert [place] if she experiences these symptoms.  Provided patient with medication education material and answered all  questions. Will place referral to [facility] once labs from today result  Patient has history of CKD - not a candidate for bisphosphonates  Chesley Mires, PharmD, MPH, BCPS, CPP Clinical Pharmacist (Rheumatology and Pulmonology)

## 2022-05-24 ENCOUNTER — Encounter: Payer: Self-pay | Admitting: Physician Assistant

## 2022-05-24 DIAGNOSIS — Z9181 History of falling: Secondary | ICD-10-CM | POA: Diagnosis not present

## 2022-05-24 DIAGNOSIS — S72002D Fracture of unspecified part of neck of left femur, subsequent encounter for closed fracture with routine healing: Secondary | ICD-10-CM | POA: Diagnosis not present

## 2022-05-24 DIAGNOSIS — M6281 Muscle weakness (generalized): Secondary | ICD-10-CM | POA: Diagnosis not present

## 2022-05-24 DIAGNOSIS — R2689 Other abnormalities of gait and mobility: Secondary | ICD-10-CM | POA: Diagnosis not present

## 2022-05-24 DIAGNOSIS — Z96642 Presence of left artificial hip joint: Secondary | ICD-10-CM | POA: Diagnosis not present

## 2022-05-24 LAB — BASIC METABOLIC PANEL WITH GFR
BUN/Creatinine Ratio: 24 (calc) — ABNORMAL HIGH (ref 6–22)
BUN: 32 mg/dL — ABNORMAL HIGH (ref 7–25)
CO2: 21 mmol/L (ref 20–32)
Calcium: 9.5 mg/dL (ref 8.6–10.4)
Chloride: 105 mmol/L (ref 98–110)
Creat: 1.34 mg/dL — ABNORMAL HIGH (ref 0.60–0.95)
Glucose, Bld: 81 mg/dL (ref 65–99)
Potassium: 4.4 mmol/L (ref 3.5–5.3)
Sodium: 139 mmol/L (ref 135–146)
eGFR: 37 mL/min/{1.73_m2} — ABNORMAL LOW (ref >=60)

## 2022-05-24 LAB — VITAMIN D 25 HYDROXY (VIT D DEFICIENCY, FRACTURES): Vit D, 25-Hydroxy: 56 ng/mL (ref 30–100)

## 2022-05-24 NOTE — Telephone Encounter (Signed)
Vitamin D and calcium wnl. PTH, SPEP, phosphorous wnl. Ok to proceed with Evenity. Referral to [facility] placed  Dose: Evenity 210mg  monthly x 12 doses  No premedications required  Chesley Mires, PharmD, MPH, BCPS, CPP Clinical Pharmacist (Rheumatology and Pulmonology)

## 2022-05-24 NOTE — Progress Notes (Signed)
Vitamin D WNL.  Creatinine remains elevated-1.34 and GFR is low 37. Avoid NSAID use. Rest of BMP WNL

## 2022-05-27 ENCOUNTER — Telehealth: Payer: Self-pay | Admitting: Pharmacy Technician

## 2022-05-27 NOTE — Telephone Encounter (Signed)
Whitney Henderson note:  Auth Submission: APPROVED Site of care: Site of care: [facility] Payer: [employer] MEDICARE Medication & CPT/J Code(s) submitted: Evenity (Romosozumab) [id number] Route of submission (phone, fax, portal): PORTAL Phone # Fax # Auth type: Buy/Bill Units/visits requested: 12 Reference number: [id number] Approval from: 05/24/22 to 05/24/23   Patient will be scheduled as soon as possible

## 2022-05-30 NOTE — Telephone Encounter (Signed)
Evenity scheduled for 06/12/22. Will f/u to ensure completed  Chesley Mires, PharmD, MPH, BCPS, CPP Clinical Pharmacist (Rheumatology and Pulmonology)

## 2022-06-12 ENCOUNTER — Ambulatory Visit (INDEPENDENT_AMBULATORY_CARE_PROVIDER_SITE_OTHER): Payer: Medicare Other

## 2022-06-12 VITALS — BP 183/79 | HR 55 | Temp 97.5°F | Resp 18 | Ht 62.0 in | Wt 118.8 lb

## 2022-06-12 DIAGNOSIS — S72002D Fracture of unspecified part of neck of left femur, subsequent encounter for closed fracture with routine healing: Secondary | ICD-10-CM | POA: Diagnosis not present

## 2022-06-12 DIAGNOSIS — M8000XA Age-related osteoporosis with current pathological fracture, unspecified site, initial encounter for fracture: Secondary | ICD-10-CM

## 2022-06-12 MED ORDER — ROMOSOZUMAB-AQQG 105 MG/1.17ML ~~LOC~~ SOSY
210.0000 mg | PREFILLED_SYRINGE | Freq: Once | SUBCUTANEOUS | Status: AC
  Administered 2022-06-12: 210 mg via SUBCUTANEOUS
  Filled 2022-06-12: qty 2.34

## 2022-06-12 NOTE — Progress Notes (Signed)
Diagnosis: Osteoporosis  Provider:  Chilton Greathouse MD  Procedure: Injection  Evenity (Romosozumab-aqqg), Dose: 210 mg, Site: subcutaneous, Number of injections: 2  Post Care: Observation period completed  Discharge: Condition: Good, Destination: Home . AVS Provided  Performed by:  Adriana Mccallum, RN

## 2022-06-12 NOTE — Patient Instructions (Signed)
Romosozumab Injection What is this medication? ROMOSOZUMAB (roe moe SOZ ue mab) prevents and treats osteoporosis. It works by making your bones stronger and less likely to break (fracture). It is a monoclonal antibody. This medicine may be used for other purposes; ask your health care provider or pharmacist if you have questions. COMMON BRAND NAME(S): EVENITY What should I tell my care team before I take this medication? They need to know if you have any of these conditions: Dental disease Heart attack Heart disease Kidney problems Low levels of calcium in the blood On dialysis Stroke Wear dentures An unusual or allergic reaction to romosozumab, other medications, foods, dyes or preservatives Pregnant or trying to get pregnant Breast-feeding How should I use this medication? This medication is injected under the skin. It is given by your care team in a hospital or clinic setting. A special MedGuide will be given to you by the pharmacist with each prescription and refill. Be sure to read this information carefully each time. Talk to your care team about the use of this medication in children. Special care may be needed. Overdosage: If you think you have taken too much of this medicine contact a poison control center or emergency room at once. NOTE: This medicine is only for you. Do not share this medicine with others. What if I miss a dose? Keep appointments for follow-up doses. It is important not to miss your dose. Call your care team if you are unable to keep an appointment. What may interact with this medication? Interactions are not expected. This list may not describe all possible interactions. Give your health care provider a list of all the medicines, herbs, non-prescription drugs, or dietary supplements you use. Also tell them if you smoke, drink alcohol, or use illegal drugs. Some items may interact with your medicine. What should I watch for while using this medication? Your  condition will be monitored carefully while you are receiving this medication. You may need bloodwork while taking this medication. You should make sure you get enough calcium and vitamin D while you are taking this medication. Discuss the foods you eat and the vitamins you take with your care team. Some people who take this medication have severe bone, joint, or muscle pain. This medication may also increase your risk for jaw problems or a broken thigh bone. Tell your care team right away if you have severe pain in your jaw, bones, joints, or muscles. Tell you care team if you have any pain that does not go away or that gets worse. Tell your dentist and dental surgeon that you are taking this medication. You should not have major dental surgery while on this medication. See your dentist to have a dental exam and fix any dental problems before starting this medication. Take good care of your teeth while on this medication. Make sure you see your dentist for regular follow-up appointments. What side effects may I notice from receiving this medication? Side effects that you should report to your care team as soon as possible: Allergic reactions or angioedema--skin rash, itching or hives, swelling of the face, eyes, lips, tongue, arms, or legs, trouble swallowing or breathing Heart attack--pain or tightness in the chest, shoulders, arms, or jaw, nausea, shortness of breath, cold or clammy skin, feeling faint or lightheaded Low calcium level--muscle pain or cramps, confusion, tingling, or numbness in the hands or feet Osteonecrosis of the jaw--pain, swelling, or redness in the mouth, numbness of the jaw, poor healing after dental work,   unusual discharge from the mouth, visible bones in the mouth Severe bone, joint, or muscle pain Stroke--sudden numbness or weakness of the face, arm, or leg, trouble speaking, confusion, trouble walking, loss of balance or coordination, dizziness, severe headache, change in  vision Side effects that usually do not require medical attention (report to your care team if they continue or are bothersome): Headache Joint pain Muscle spasms Pain, redness, or irritation at injection site Swelling of the ankles, hands, or feet This list may not describe all possible side effects. Call your doctor for medical advice about side effects. You may report side effects to FDA at 1-800-FDA-1088. Where should I keep my medication? This medication is given in a hospital or clinic. It will not be stored at home. NOTE: This sheet is a summary. It may not cover all possible information. If you have questions about this medicine, talk to your doctor, pharmacist, or health care provider.  2023 Elsevier/Gold Standard (2021-02-07 00:00:00)  

## 2022-06-24 DIAGNOSIS — M6281 Muscle weakness (generalized): Secondary | ICD-10-CM | POA: Diagnosis not present

## 2022-06-24 DIAGNOSIS — R2689 Other abnormalities of gait and mobility: Secondary | ICD-10-CM | POA: Diagnosis not present

## 2022-06-24 DIAGNOSIS — Z96642 Presence of left artificial hip joint: Secondary | ICD-10-CM | POA: Diagnosis not present

## 2022-06-24 DIAGNOSIS — Z9181 History of falling: Secondary | ICD-10-CM | POA: Diagnosis not present

## 2022-06-24 DIAGNOSIS — S72002D Fracture of unspecified part of neck of left femur, subsequent encounter for closed fracture with routine healing: Secondary | ICD-10-CM | POA: Diagnosis not present

## 2022-07-01 ENCOUNTER — Ambulatory Visit: Payer: Self-pay

## 2022-07-01 NOTE — Patient Outreach (Signed)
  Care Coordination   Follow Up Visit Note   07/01/2022 Name: Whitney Henderson MRN: [id number] DOB: [birth year]  Whitney Henderson is a 89 y.o. year old female who sees [name], MD for primary care. I spoke with  Whitney Henderson by phone today.  What matters to the patients health and wellness today?  Patient would like to remain free from UTI's and falls.     Goals Addressed             This Visit's Progress    I am worried about mother having another UTI       Care Coordination Interventions: Assessed the Patient understanding of chronic kidney disease    Reviewed prescribed diet continue to increase daily water intake to 48-64 oz daily unless otherwise directed Advised patient to discuss persistent urinary incontinence  with provider    Report new symptoms or concerns to PCP promptly for early diagnosis/treatment      To improve ankle edema following EVENITY injection       Care Coordination Interventions: Evaluation of current treatment plan related to Osteoporosis and patient's adherence to plan as established by provider Determined patient is experiencing mild bilateral ankle edema following her EVENITY injection for Osteoporosis Educated daughter and patient regarding the symptom reported to be noted as a common SE that may occur with use of this medication  Instructed patient/daughter to elevate lower extremities while resting and apply compression socks if available, eat a low Sodium diet and to inform patient's doctor of persistent or worsening symptoms  Reviewed next upcoming scheduled AWV and PCP follow up with Dr. [name] scheduled for 08/07/22 @11 :00/11:40 AM    Interventions Today    Flowsheet Row Most Recent Value  Chronic Disease   Chronic disease during today's visit Chronic Kidney Disease/End Stage Renal Disease (ESRD), Other  [ankle edema,  urinary incontinence]  General Interventions   General Interventions Discussed/Reviewed General Interventions  Discussed, General Interventions Reviewed, Doctor Visits, Labs  Labs Kidney Function  [Vitamin D]  Doctor Visits Discussed/Reviewed Doctor Visits Discussed, Doctor Visits Reviewed, Specialist  Education Interventions   Education Provided Provided Education  Provided Verbal Education On Nutrition, Medication, When to see the doctor  Nutrition Interventions   Nutrition Discussed/Reviewed Fluid intake, Nutrition Reviewed, Decreasing salt  Pharmacy Interventions   Pharmacy Dicussed/Reviewed Pharmacy Topics Discussed, Pharmacy Topics Reviewed, Medications and their functions  Safety Interventions   Safety Discussed/Reviewed Safety Discussed, Safety Reviewed, Fall Risk, Home Safety  Home Safety Assistive Devices          SDOH assessments and interventions completed:  No     Care Coordination Interventions:  Yes, provided   Follow up plan: Follow up call scheduled for 09/04/22 @2 :00 PM     Encounter Outcome:  Pt. Visit Completed

## 2022-07-01 NOTE — Patient Instructions (Signed)
Visit Information  Thank you for taking time to visit with me today. Please don't hesitate to contact me if I can be of assistance to you.   Following are the goals we discussed today:   Goals Addressed             This Visit's Progress    I am worried about mother having another UTI       Care Coordination Interventions: Assessed the Patient understanding of chronic kidney disease    Reviewed prescribed diet continue to increase daily water intake to 48-64 oz daily unless otherwise directed Advised patient to discuss persistent urinary incontinence  with provider    Report new symptoms or concerns to PCP promptly for early diagnosis/treatment         To improve ankle edema following EVENITY injection       Care Coordination Interventions: Evaluation of current treatment plan related to Osteoporosis and patient's adherence to plan as established by provider Determined patient is experiencing mild bilateral ankle edema following her EVENITY injection for Osteoporosis Educated daughter and patient regarding the symptom reported to be noted as a common SE that may occur with use of this medication  Instructed patient/daughter to elevate lower extremities while resting and apply compression socks if available, eat a low Sodium diet and to inform patient's doctor of persistent or worsening symptoms  Reviewed next upcoming scheduled AWV and PCP follow up with Dr. [name] scheduled for 08/07/22 @11 :00/11:40 AM           Our next appointment is by telephone on 09/04/22 at 2:00 PM  Please call the care guide team at [phone] if you need to cancel or reschedule your appointment.   If you are experiencing a Mental Health or Behavioral Health Crisis or need someone to talk to, please call 1-800-273-TALK (toll free, 24 hour hotline)  The patient verbalized understanding of instructions, educational materials, and care plan provided today and DECLINED offer to receive copy of patient  instructions, educational materials, and care plan.   Delsa Sale, RN, BSN, CCM Care Management Coordinator [facility] Care Management  Direct Phone: [phone]

## 2022-07-10 ENCOUNTER — Ambulatory Visit (INDEPENDENT_AMBULATORY_CARE_PROVIDER_SITE_OTHER): Payer: Medicare Other | Admitting: *Deleted

## 2022-07-10 VITALS — BP 161/76 | HR 56 | Temp 97.6°F | Resp 16 | Ht 62.0 in | Wt 117.0 lb

## 2022-07-10 DIAGNOSIS — M8000XA Age-related osteoporosis with current pathological fracture, unspecified site, initial encounter for fracture: Secondary | ICD-10-CM

## 2022-07-10 DIAGNOSIS — M8000XD Age-related osteoporosis with current pathological fracture, unspecified site, subsequent encounter for fracture with routine healing: Secondary | ICD-10-CM

## 2022-07-10 DIAGNOSIS — S72002D Fracture of unspecified part of neck of left femur, subsequent encounter for closed fracture with routine healing: Secondary | ICD-10-CM | POA: Diagnosis not present

## 2022-07-10 MED ORDER — ROMOSOZUMAB-AQQG 105 MG/1.17ML ~~LOC~~ SOSY
210.0000 mg | PREFILLED_SYRINGE | Freq: Once | SUBCUTANEOUS | Status: AC
  Administered 2022-07-10: 210 mg via SUBCUTANEOUS
  Filled 2022-07-10: qty 2.34

## 2022-07-10 NOTE — Patient Instructions (Signed)
Romosozumab Injection What is this medication? ROMOSOZUMAB (roe moe SOZ ue mab) prevents and treats osteoporosis. It works by Paramedic stronger and less likely to break (fracture). It is a monoclonal antibody. This medicine may be used for other purposes; ask your health care provider or pharmacist if you have questions. COMMON BRAND NAME(S): EVENITY What should I tell my care team before I take this medication? They need to know if you have any of these conditions: Dental disease Heart attack Heart disease Kidney problems Low levels of calcium in the blood On dialysis Stroke Wear dentures An unusual or allergic reaction to romosozumab, other medications, foods, dyes or preservatives Pregnant or trying to get pregnant Breast-feeding How should I use this medication? This medication is injected under the skin. It is given by your care team in a hospital or clinic setting. A special MedGuide will be given to you by the pharmacist with each prescription and refill. Be sure to read this information carefully each time. Talk to your care team about the use of this medication in children. Special care may be needed. Overdosage: If you think you have taken too much of this medicine contact a poison control center or emergency room at once. NOTE: This medicine is only for you. Do not share this medicine with others. What if I miss a dose? Keep appointments for follow-up doses. It is important not to miss your dose. Call your care team if you are unable to keep an appointment. What may interact with this medication? Interactions are not expected. This list may not describe all possible interactions. Give your health care provider a list of all the medicines, herbs, non-prescription drugs, or dietary supplements you use. Also tell them if you smoke, drink alcohol, or use illegal drugs. Some items may interact with your medicine. What should I watch for while using this medication? Your  condition will be monitored carefully while you are receiving this medication. You may need bloodwork while taking this medication. You should make sure you get enough calcium and vitamin D while you are taking this medication. Discuss the foods you eat and the vitamins you take with your care team. Some people who take this medication have severe bone, joint, or muscle pain. This medication may also increase your risk for jaw problems or a broken thigh bone. Tell your care team right away if you have severe pain in your jaw, bones, joints, or muscles. Tell you care team if you have any pain that does not go away or that gets worse. Tell your dentist and dental surgeon that you are taking this medication. You should not have major dental surgery while on this medication. See your dentist to have a dental exam and fix any dental problems before starting this medication. Take good care of your teeth while on this medication. Make sure you see your dentist for regular follow-up appointments. What side effects may I notice from receiving this medication? Side effects that you should report to your care team as soon as possible: Allergic reactions or angioedema--skin rash, itching or hives, swelling of the face, eyes, lips, tongue, arms, or legs, trouble swallowing or breathing Heart attack--pain or tightness in the chest, shoulders, arms, or jaw, nausea, shortness of breath, cold or clammy skin, feeling faint or lightheaded Low calcium level--muscle pain or cramps, confusion, tingling, or numbness in the hands or feet Osteonecrosis of the jaw--pain, swelling, or redness in the mouth, numbness of the jaw, poor healing after dental work,  unusual discharge from the mouth, visible bones in the mouth Severe bone, joint, or muscle pain Stroke--sudden numbness or weakness of the face, arm, or leg, trouble speaking, confusion, trouble walking, loss of balance or coordination, dizziness, severe headache, change in  vision Side effects that usually do not require medical attention (report to your care team if they continue or are bothersome): Headache Joint pain Muscle spasms Pain, redness, or irritation at injection site Swelling of the ankles, hands, or feet This list may not describe all possible side effects. Call your doctor for medical advice about side effects. You may report side effects to FDA at 1-800-FDA-1088. Where should I keep my medication? This medication is given in a hospital or clinic. It will not be stored at home. NOTE: This sheet is a summary. It may not cover all possible information. If you have questions about this medicine, talk to your doctor, pharmacist, or health care provider.  2024 Elsevier/Gold Standard (2021-02-07 00:00:00)

## 2022-07-10 NOTE — Progress Notes (Signed)
Diagnosis: Injection  Provider:  Chilton Greathouse MD  Procedure: Injection  Evenity (Romosozumab-aqqg), Dose: 210 mg, Site: subcutaneous, Number of injections: 2  Post Care: Observation period completed  Discharge: Condition: Good, Destination: Home . AVS Provided  Performed by:  Forrest Moron, RN

## 2022-07-24 DIAGNOSIS — M6281 Muscle weakness (generalized): Secondary | ICD-10-CM | POA: Diagnosis not present

## 2022-07-24 DIAGNOSIS — S72002D Fracture of unspecified part of neck of left femur, subsequent encounter for closed fracture with routine healing: Secondary | ICD-10-CM | POA: Diagnosis not present

## 2022-07-24 DIAGNOSIS — R2689 Other abnormalities of gait and mobility: Secondary | ICD-10-CM | POA: Diagnosis not present

## 2022-07-24 DIAGNOSIS — Z96642 Presence of left artificial hip joint: Secondary | ICD-10-CM | POA: Diagnosis not present

## 2022-07-24 DIAGNOSIS — Z9181 History of falling: Secondary | ICD-10-CM | POA: Diagnosis not present

## 2022-08-07 ENCOUNTER — Ambulatory Visit (INDEPENDENT_AMBULATORY_CARE_PROVIDER_SITE_OTHER): Payer: Medicare Other | Admitting: Internal Medicine

## 2022-08-07 ENCOUNTER — Encounter: Payer: Self-pay | Admitting: Internal Medicine

## 2022-08-07 ENCOUNTER — Ambulatory Visit (INDEPENDENT_AMBULATORY_CARE_PROVIDER_SITE_OTHER): Payer: Medicare Other

## 2022-08-07 VITALS — BP 128/70 | HR 69 | Temp 98.3°F | Ht 60.0 in | Wt 117.6 lb

## 2022-08-07 VITALS — BP 128/70 | HR 69 | Temp 98.3°F | Ht 60.0 in | Wt 117.0 lb

## 2022-08-07 DIAGNOSIS — M545 Low back pain, unspecified: Secondary | ICD-10-CM

## 2022-08-07 DIAGNOSIS — Z79899 Other long term (current) drug therapy: Secondary | ICD-10-CM | POA: Diagnosis not present

## 2022-08-07 DIAGNOSIS — L84 Corns and callosities: Secondary | ICD-10-CM

## 2022-08-07 DIAGNOSIS — Z6822 Body mass index (BMI) 22.0-22.9, adult: Secondary | ICD-10-CM

## 2022-08-07 DIAGNOSIS — G8929 Other chronic pain: Secondary | ICD-10-CM

## 2022-08-07 DIAGNOSIS — N1832 Chronic kidney disease, stage 3b: Secondary | ICD-10-CM | POA: Diagnosis not present

## 2022-08-07 DIAGNOSIS — M79606 Pain in leg, unspecified: Secondary | ICD-10-CM

## 2022-08-07 DIAGNOSIS — I129 Hypertensive chronic kidney disease with stage 1 through stage 4 chronic kidney disease, or unspecified chronic kidney disease: Secondary | ICD-10-CM

## 2022-08-07 DIAGNOSIS — Z0000 Encounter for general adult medical examination without abnormal findings: Secondary | ICD-10-CM

## 2022-08-07 NOTE — Patient Instructions (Signed)
Hypertension, Adult Hypertension is another name for high blood pressure. High blood pressure forces your heart to work harder to pump blood. This can cause problems over time. There are two numbers in a blood pressure reading. There is a top number (systolic) over a bottom number (diastolic). It is best to have a blood pressure that is below 120/80. What are the causes? The cause of this condition is not known. Some other conditions can lead to high blood pressure. What increases the risk? Some lifestyle factors can make you more likely to develop high blood pressure: Smoking. Not getting enough exercise or physical activity. Being overweight. Having too much fat, sugar, calories, or salt (sodium) in your diet. Drinking too much alcohol. Other risk factors include: Having any of these conditions: Heart disease. Diabetes. High cholesterol. Kidney disease. Obstructive sleep apnea. Having a family history of high blood pressure and high cholesterol. Age. The risk increases with age. Stress. What are the signs or symptoms? High blood pressure may not cause symptoms. Very high blood pressure (hypertensive crisis) may cause: Headache. Fast or uneven heartbeats (palpitations). Shortness of breath. Nosebleed. Vomiting or feeling like you may vomit (nauseous). Changes in how you see. Very bad chest pain. Feeling dizzy. Seizures. How is this treated? This condition is treated by making healthy lifestyle changes, such as: Eating healthy foods. Exercising more. Drinking less alcohol. Your doctor may prescribe medicine if lifestyle changes do not help enough and if: Your top number is above 130. Your bottom number is above 80. Your personal target blood pressure may vary. Follow these instructions at home: Eating and drinking  If told, follow the DASH eating plan. To follow this plan: Fill one half of your plate at each meal with fruits and vegetables. Fill one fourth of your plate  at each meal with whole grains. Whole grains include whole-wheat pasta, brown rice, and whole-grain bread. Eat or drink low-fat dairy products, such as skim milk or low-fat yogurt. Fill one fourth of your plate at each meal with low-fat (lean) proteins. Low-fat proteins include fish, chicken without skin, eggs, beans, and tofu. Avoid fatty meat, cured and processed meat, or chicken with skin. Avoid pre-made or processed food. Limit the amount of salt in your diet to less than 1,500 mg each day. Do not drink alcohol if: Your doctor tells you not to drink. You are pregnant, may be pregnant, or are planning to become pregnant. If you drink alcohol: Limit how much you have to: 0-1 drink a day for women. 0-2 drinks a day for men. Know how much alcohol is in your drink. In the [place] one drink equals one 12 oz bottle of beer (355 mL), one 5 oz glass of wine (148 mL), or one 1 oz glass of hard liquor (44 mL). Lifestyle  Work with your doctor to stay at a healthy weight or to lose weight. Ask your doctor what the best weight is for you. Get at least 30 minutes of exercise that causes your heart to beat faster (aerobic exercise) most days of the week. This may include walking, swimming, or biking. Get at least 30 minutes of exercise that strengthens your muscles (resistance exercise) at least 3 days a week. This may include lifting weights or doing Pilates. Do not smoke or use any products that contain nicotine or tobacco. If you need help quitting, ask your doctor. Check your blood pressure at home as told by your doctor. Keep all follow-up visits. Medicines Take over-the-counter and prescription medicines   only as told by your doctor. Follow directions carefully. Do not skip doses of blood pressure medicine. The medicine does not work as well if you skip doses. Skipping doses also puts you at risk for problems. Ask your doctor about side effects or reactions to medicines that you should watch  for. Contact a doctor if: You think you are having a reaction to the medicine you are taking. You have headaches that keep coming back. You feel dizzy. You have swelling in your ankles. You have trouble with your vision. Get help right away if: You get a very bad headache. You start to feel mixed up (confused). You feel weak or numb. You feel faint. You have very bad pain in your: Chest. Belly (abdomen). You vomit more than once. You have trouble breathing. These symptoms may be an emergency. Get help right away. Call 911. Do not wait to see if the symptoms will go away. Do not drive yourself to the hospital. Summary Hypertension is another name for high blood pressure. High blood pressure forces your heart to work harder to pump blood. For most people, a normal blood pressure is less than 120/80. Making healthy choices can help lower blood pressure. If your blood pressure does not get lower with healthy choices, you may need to take medicine. This information is not intended to replace advice given to you by your health care provider. Make sure you discuss any questions you have with your health care provider. Document Revised: 10/26/2020 Document Reviewed: 10/26/2020 Elsevier Patient Education  2024 [employer]  

## 2022-08-07 NOTE — Progress Notes (Signed)
Whitney Henderson, CMA,acting as a Neurosurgeon for Whitney Aliment, MD.,have documented all relevant documentation on the behalf of Whitney Aliment, MD,as directed by  Whitney Aliment, MD while in the presence of Whitney Aliment, MD.  Subjective:  Patient ID: Whitney Henderson , female    DOB: 1928-04-18 , 89 y.o.   MRN: [id number]  Chief Complaint  Patient presents with   Hypertension    HPI  She presents for BP check. She reports compliance with meds. She is accompanied by her daughter, Whitney Henderson. Today. She offers no concerns. Patient denies headaches, chest pain and shortness of breath.   Patient daughter adds, noticing on the side of her right foot, a build up of what looks like callus. She is not currently established with a podiatrist.     AWV completed with [facility] Advisor: Nickeah    Hypertension This is a chronic problem. The current episode started more than 1 year ago. The problem has been gradually improving since onset. The problem is uncontrolled. Pertinent negatives include no blurred vision, chest pain, headaches, neck pain, orthopnea, palpitations or shortness of breath. The current treatment provides moderate improvement. Compliance problems include exercise.      Past Medical History:  Diagnosis Date   Anxiety    Cataracts, bilateral    Discoid lupus    Headache 04/27/2015   Left>right periorbital   Hypertension    Memory difficulties 09/04/2016     Family History  Problem Relation Age of Onset   Early death Mother    Cancer Father    Thyroid disease Daughter    Breast cancer Daughter        61s   Glaucoma Daughter    Hypertension Daughter    Heart attack Son    Thyroid disease Son      Current Outpatient Medications:    acetaminophen (TYLENOL) 500 MG tablet, Take 500 mg by mouth every 6 (six) hours as needed for mild pain or moderate pain., Disp: , Rfl:    amLODipine (NORVASC) 2.5 MG tablet, Take 1 tablet (2.5 mg total) by mouth 2 (two) times daily. One tab po  twice daily with breakfast and dinner, Disp: 180 tablet, Rfl: 1   Apoaequorin (PREVAGEN PO), Take 1 capsule by mouth every other day., Disp: , Rfl:    betamethasone dipropionate (DIPROLENE) 0.05 % cream, APPLY THIN LAYER EXTERNALLY TO THE AFFECTED AREA EVERY DAY AS NEEDED (Patient taking differently: 1 application  daily as needed (skin irritation).), Disp: 60 g, Rfl: 2   Calcium Carb-Cholecalciferol (CALCIUM 500 +D PO), Take 1 tablet by mouth every other day., Disp: , Rfl:    Multiple Vitamins-Minerals (CENTRUM SILVER PO), Take 1 tablet by mouth every other day., Disp: , Rfl:    Propylene Glycol (SYSTANE BALANCE OP), Place 1 drop into both eyes daily as needed (for itchy eyes)., Disp: , Rfl:    sertraline (ZOLOFT) 25 MG tablet, TAKE 1 TABLET(25 MG) BY MOUTH DAILY, Disp: 90 tablet, Rfl: 1   thiamine 100 MG tablet, Take 100 mg by mouth daily., Disp: , Rfl:    triamcinolone cream (KENALOG) 0.1 %, APPLY TO AFFECTED AREA TWICE DAILY AS NEEDED (Patient taking differently: Apply 1 Application topically 2 (two) times daily as needed (for flares).), Disp: 45 g, Rfl: 0   aspirin 81 MG chewable tablet, Chew 1 tablet (81 mg total) by mouth 2 (two) times daily. (Patient not taking: Reported on 03/28/2022), Disp: 30 tablet, Rfl: 0   vitamin C (ASCORBIC  ACID) 500 MG tablet, Take 500 mg by mouth every other day. (Patient not taking: Reported on 08/07/2022), Disp: , Rfl:    Allergies  Allergen Reactions   Fish Allergy Other (See Comments)    Does not do well with Seafood.   Codeine Rash     Review of Systems  Constitutional: Negative.   Eyes:  Negative for blurred vision.  Respiratory: Negative.  Negative for shortness of breath.   Cardiovascular: Negative.  Negative for chest pain, palpitations and orthopnea.  Gastrointestinal: Negative.   Musculoskeletal:  Positive for back pain. Negative for neck pain.       Patient also c/o back pain. Bothpatient and daughter thinks it is related to recent EVENITY  injection. They plan to discuss further with rheumatologist or pharmacist at next visit. Denies associated LE weakness/paresthesias and urinary/fecal incontinence. . She does admit to wearing Depends at night. She is able to use the restroom without issue during the day.   Neurological: Negative.  Negative for headaches.  Psychiatric/Behavioral: Negative.       Today's Vitals   08/07/22 1107  BP: 128/70  Pulse: 69  Temp: 98.3 F (36.8 C)  SpO2: 98%  Weight: 117 lb (53.1 kg)  Height: 5' (1.524 m)   Body mass index is 22.85 kg/m.  Wt Readings from Last 3 Encounters:  08/13/22 118 lb 3.2 oz (53.6 kg)  08/07/22 117 lb (53.1 kg)  08/07/22 117 lb 9.6 oz (53.3 kg)    The ASCVD Risk score (Arnett DK, et al., 2019) failed to calculate for the following reasons:   The 2019 ASCVD risk score is only valid for ages 74 to 94  Objective:  Physical Exam Vitals and nursing note reviewed.  Constitutional:      Appearance: Normal appearance.  HENT:     Head: Normocephalic and atraumatic.  Eyes:     Extraocular Movements: Extraocular movements intact.  Cardiovascular:     Rate and Rhythm: Normal rate and regular rhythm.     Heart sounds: Normal heart sounds.  Pulmonary:     Effort: Pulmonary effort is normal.     Breath sounds: Normal breath sounds.  Musculoskeletal:        General: Tenderness present.     Cervical back: Normal range of motion.  Skin:    General: Skin is warm.     Comments: Large callus on right foot  Neurological:     General: No focal deficit present.     Mental Status: She is alert.  Psychiatric:        Mood and Affect: Mood normal.        Behavior: Behavior normal.         Assessment And Plan:  Parenchymal renal hypertension, stage 1 through stage 4 or unspecified chronic kidney disease Assessment & Plan: Chronic, controlled. She will c/2 amlodipine 2.5mg  twice daily(she did not tolerate once daily dosing).  She is encouraged to follow low sodium diet. She  will f/u in 4-6 months for re-evaluation. Rheum renal labs reviewed, no need to repeat today.     Stage 3b chronic kidney disease (HCC) Assessment & Plan: Stage 3b. I will check labs as below. Chronic, she is encouraged to stay well hydrated, avoid NSAIDs and keep BP controlled to prevent progression of CKD.    Orders: -     PTH, intact and calcium -     Phosphorus -     Protein electrophoresis, serum  Callus of heel Assessment & Plan: Large callus  on right foot is present, they agree to Podiatry evaluatation. Reminded to use moisturizing lotion on feet daily.   Orders: -     Ambulatory referral to Podiatry  Chronic low back pain without sciatica, unspecified back pain laterality Assessment & Plan: Chronic, she is currently followed by Rheumatology. She is encouraged to perform stretching exercises while    Body mass index (BMI) 22.0-22.9, adult Assessment & Plan: Her weight is stable over the past month. Both she and her daughter state she has a great appetite.    Drug therapy -     Vitamin B12    Return for 1 year ov w rs & thn. 6 month bpc.  Patient was given opportunity to ask questions. Patient verbalized understanding of the plan and was able to repeat key elements of the plan. All questions were answered to their satisfaction.    I, Whitney Aliment, MD, have reviewed all documentation for this visit. The documentation on 08/07/22 for the exam, diagnosis, procedures, and orders are all accurate and complete.   IF YOU HAVE BEEN REFERRED TO A SPECIALIST, IT MAY TAKE 1-2 WEEKS TO SCHEDULE/PROCESS THE REFERRAL. IF YOU HAVE NOT HEARD FROM US/SPECIALIST IN TWO WEEKS, PLEASE GIVE [place] A CALL AT [phone].

## 2022-08-07 NOTE — Progress Notes (Signed)
Subjective:   Whitney Henderson is a 89 y.o. female who presents for Medicare Annual (Subsequent) preventive examination.  Visit Complete: In person    Review of Systems     Cardiac Risk Factors include: advanced age (>41men, >86 women)     Objective:    Today's Vitals   08/07/22 1056  BP: 128/70  Pulse: 69  Temp: 98.3 F (36.8 C)  TempSrc: Oral  SpO2: 98%  Weight: 117 lb 9.6 oz (53.3 kg)  Height: 5' (1.524 m)   Body mass index is 22.97 kg/m.     08/07/2022   11:06 AM 07/19/2021   11:05 AM 12/20/2020   11:43 AM 06/29/2020   11:28 AM 04/25/2020    2:55 PM 12/02/2019    9:28 AM 12/01/2018   10:53 AM  Advanced Directives  Does Patient Have a Medical Advance Directive? Yes Yes No Yes No Yes Yes  Type of Estate agent of [place];Living will Healthcare Power of [place];Living will  Healthcare Power of [place];Living will  Healthcare Power of [place];Living will Healthcare Power of [place];Living will  Copy of Healthcare Power of Attorney in Chart? No - copy requested No - copy requested  No - copy requested  No - copy requested No - copy requested  Would patient like information on creating a medical advance directive?     No - Patient declined      Current Medications (verified) Outpatient Encounter Medications as of 08/07/2022  Medication Sig   acetaminophen (TYLENOL) 500 MG tablet Take 500 mg by mouth every 6 (six) hours as needed for mild pain or moderate pain.   amLODipine (NORVASC) 2.5 MG tablet Take 1 tablet (2.5 mg total) by mouth 2 (two) times daily. One tab po twice daily with breakfast and dinner   Apoaequorin (PREVAGEN PO) Take 1 capsule by mouth every other day.   betamethasone dipropionate (DIPROLENE) 0.05 % cream APPLY THIN LAYER EXTERNALLY TO THE AFFECTED AREA EVERY DAY AS NEEDED (Patient taking differently: 1 application  daily as needed (skin irritation).)   Calcium Carb-Cholecalciferol (CALCIUM 500 +D PO) Take 1 tablet by mouth  every other day.   Multiple Vitamins-Minerals (CENTRUM SILVER PO) Take 1 tablet by mouth every other day.   Propylene Glycol (SYSTANE BALANCE OP) Place 1 drop into both eyes daily as needed (for itchy eyes).   sertraline (ZOLOFT) 25 MG tablet TAKE 1 TABLET(25 MG) BY MOUTH DAILY   thiamine 100 MG tablet Take 100 mg by mouth daily.   triamcinolone cream (KENALOG) 0.1 % APPLY TO AFFECTED AREA TWICE DAILY AS NEEDED (Patient taking differently: Apply 1 Application topically 2 (two) times daily as needed (for flares).)   aspirin 81 MG chewable tablet Chew 1 tablet (81 mg total) by mouth 2 (two) times daily. (Patient not taking: Reported on 03/28/2022)   vitamin C (ASCORBIC ACID) 500 MG tablet Take 500 mg by mouth every other day. (Patient not taking: Reported on 08/07/2022)   No facility-administered encounter medications on file as of 08/07/2022.    Allergies (verified) Fish allergy and Codeine   History: Past Medical History:  Diagnosis Date   Anxiety    Cataracts, bilateral    Discoid lupus    Headache 04/27/2015   Left>right periorbital   Hypertension    Memory difficulties 09/04/2016   Past Surgical History:  Procedure Laterality Date   ABDOMINAL HYSTERECTOMY     BREAST EXCISIONAL BIOPSY Left over 20 ys ago   benign   CHOLECYSTECTOMY  EXCISIONAL HEMORRHOIDECTOMY     TOTAL HIP ARTHROPLASTY Left 11/03/2021   Procedure: LEFT HEMI HIP ARTHROPLASTY ANTERIOR APPROACH;  Surgeon: Kathryne Hitch, MD;  Location: [facility] OR;  Service: Orthopedics;  Laterality: Left;   Family History  Problem Relation Age of Onset   Early death Mother    Cancer Father    Thyroid disease Daughter    Breast cancer Daughter        67s   Glaucoma Daughter    Hypertension Daughter    Heart attack Son    Thyroid disease Son    Social History   Socioeconomic History   Marital status: Widowed    Spouse name: Ivar Drape    Number of children: 4   Years of education: College- Masters   [employer] education  level: Not on file  Occupational History   Not on file  Tobacco Use   Smoking status: Former    Types: Cigarettes    Passive exposure: Never   Smokeless tobacco: Never   Tobacco comments:    smoked sometimes not daily  Vaping Use   Vaping status: Never Used  Substance and Sexual Activity   Alcohol use: No   Drug use: No   Sexual activity: Not Currently  Other Topics Concern   Not on file  Social History Narrative   Live with husband,  4 children.    Caffeine: 1 cup daily    Social Determinants of Health   Financial Resource Strain: Low Risk  (08/07/2022)   Overall Financial Resource Strain (CARDIA)    Difficulty of Paying Living Expenses: Not hard at all  Food Insecurity: No Food Insecurity (08/07/2022)   Hunger Vital Sign    Worried About Running Out of Food in the Last Year: Never true    Ran Out of Food in the Last Year: Never true  Transportation Needs: No Transportation Needs (08/07/2022)   PRAPARE - Administrator, Civil Service (Medical): No    Lack of Transportation (Non-Medical): No  Physical Activity: Sufficiently Active (08/07/2022)   Exercise Vital Sign    Days of Exercise per Week: 7 days    Minutes of Exercise per Session: 30 min  Stress: Stress Concern Present (08/07/2022)   [employer] of Occupational Health - Occupational Stress Questionnaire    Feeling of Stress : To some extent  Social Connections: Socially Isolated (08/07/2022)   Social Connection and Isolation Panel [NHANES]    Frequency of Communication with Friends and Family: Twice a week    Frequency of Social Gatherings with Friends and Family: Never    Attends Religious Services: More than 4 times per year    Active Member of [employer] or Organizations: No    Attends Banker Meetings: Never    Marital Status: Widowed    Tobacco Counseling Counseling given: Not Answered Tobacco comments: smoked sometimes not daily   Clinical Intake:  Pre-visit preparation  completed: Yes  Pain : No/denies pain     Nutritional Status: BMI of 19-24  Normal Nutritional Risks: None Diabetes: No  How often do you need to have someone help you when you read instructions, pamphlets, or other written materials from your doctor or pharmacy?: 4 - Often  Interpreter Needed?: No  Information entered by :: NAllen LPN   Activities of Daily Living    08/07/2022   10:58 AM  In your present state of health, do you have any difficulty performing the following activities:  Hearing? 0  Vision? 1  Comment a little blurry  Difficulty concentrating or making decisions? 1  Walking or climbing stairs? 1  Dressing or bathing? 1  Doing errands, shopping? 1  Preparing Food and eating ? Y  Using the Toilet? N  In the past six months, have you accidently leaked urine? Y  Comment wears depends  Do you have problems with loss of bowel control? N  Managing your Medications? Y  Managing your Finances? Y  Housekeeping or managing your Housekeeping? Y    Patient Care Team: [name], MD as PCP - General (Internal Medicine) Mateo Flow, MD as Consulting Physician (Ophthalmology) Clarene Duke Karma Lew, RN as Case Manager Pearson, Pershing Cox, RPH (Inactive) (Pharmacist)  Indicate any recent Medical Services you may have received from other than [facility] providers in the past year (date may be approximate).     Assessment:   This is a routine wellness examination for Kenneisha.  Hearing/Vision screen Hearing Screening - Comments:: Denies hearing issues Vision Screening - Comments:: Regular eye exams, [facility]  Dietary issues and exercise activities discussed:     Goals Addressed             This Visit's Progress    Patient Stated       08/07/2022, to keep balance and improve water intake and diet      Depression Screen    08/07/2022   11:09 AM 03/28/2022    2:35 PM 07/19/2021   11:08 AM 06/29/2020   11:29 AM 12/02/2019    9:29 AM 12/02/2019    8:44 AM  05/19/2019   10:28 AM  PHQ 2/9 Scores  PHQ - 2 Score 3 0 1 0  3 1  PHQ- 9 Score 4     6 2   Exception Documentation     Other- indicate reason in comment box    Not completed     lready completed by CMA      Fall Risk    08/07/2022   11:06 AM 03/28/2022    2:34 PM 07/19/2021   11:06 AM 06/29/2020   11:28 AM 12/02/2019    9:28 AM  Fall Risk   Falls in the past year? 1 0 1 0 0  Comment fell turning and got off balance, tried getting out of bed,  had an UTI    Number falls in past yr: 1 0 0    Injury with Fall? 0 0 0    Risk for fall due to : History of fall(s);Impaired balance/gait;Impaired mobility;Medication side effect No Fall Risks Impaired balance/gait;Impaired mobility;Medication side effect Medication side effect Medication side effect  Follow up Falls prevention discussed;Falls evaluation completed Falls evaluation completed Falls evaluation completed;Education provided;Falls prevention discussed Falls evaluation completed;Education provided;Falls prevention discussed Falls evaluation completed;Education provided;Falls prevention discussed    MEDICARE RISK AT HOME:  Medicare Risk at Home - 08/07/22 1108     Any stairs in or around the home? Yes   has a ramp   If so, are there any without handrails? No    Home free of loose throw rugs in walkways, pet beds, electrical cords, etc? No    Adequate lighting in your home to reduce risk of falls? Yes    Life alert? No    Use of a cane, walker or w/c? Yes    Grab bars in the bathroom? Yes    Shower chair or bench in shower? Yes    Elevated toilet seat or a handicapped toilet? Yes  TIMED UP AND GO:  Was the test performed?  Yes  Length of time to ambulate 10 feet: 7 sec Gait slow and steady with assistive device    Cognitive Function:  6 CIT not administered. Has diagnosis of memory disorder.      05/10/2021   10:48 AM 09/07/2020   10:51 AM 01/06/2020   11:29 AM 07/14/2019   11:51 AM 09/04/2016    9:29 AM   MMSE - Mini Mental State Exam  Orientation to time 4 4 4 4 4   Orientation to Place 5 5 5 5 5   Registration 3 3 3 3 3   Attention/ Calculation 0 3 1 3 5   Recall 1 0 3 1 0  Language- name 2 objects 2 2 2 2 2   Language- repeat 1 1 0 1 1  Language- follow 3 step command 3 3 3 3 3   Language- read & follow direction 1 1 1 1 1   Write a sentence 1 1 1 1 1   Copy design 1 0 1 0 1  Total score 22 23 24 24 26         06/29/2020   11:30 AM 12/02/2019    9:31 AM 12/01/2018   10:58 AM 10/30/2017   10:59 AM  6CIT Screen  What Year? 4 points 0 points 0 points 0 points  What month? 0 points 0 points 0 points 0 points  What time? 0 points 0 points 0 points 0 points  Count back from 20 0 points 0 points 0 points 0 points  Months in reverse 2 points 4 points 4 points 2 points  Repeat phrase 10 points 8 points 10 points 2 points  Total Score 16 points 12 points 14 points 4 points    Immunizations Immunization History  Administered Date(s) Administered   Fluad Quad(high Dose 65+) 09/25/2018   Influenza, High Dose Seasonal PF 10/02/2016, 10/14/2019   Influenza-Unspecified 10/22/2017, 10/01/2021   PFIZER(Purple Top)SARS-COV-2 Vaccination 02/09/2019, 03/01/2019, 12/09/2019, 06/20/2020   Pneumococcal Conjugate-13 11/28/2015   Pneumococcal Polysaccharide-23 12/04/2016   Pneumococcal-Unspecified 07/17/2022   Tdap 12/20/2020   Zoster Recombinant(Shingrix) 09/25/2018, 12/04/2018    TDAP status: Up to date  Flu Vaccine status: Up to date  Pneumococcal vaccine status: Up to date  Covid-19 vaccine status: Information provided on how to obtain vaccines.   Qualifies for Shingles Vaccine? Yes   Zostavax completed No   Shingrix Completed?: Yes  Screening Tests Health Maintenance  Topic Date Due   COVID-19 Vaccine (5 - 2023-24 season) 09/21/2021   INFLUENZA VACCINE  08/22/2022   Medicare Annual Wellness (AWV)  08/07/2023   DTaP/Tdap/Td (2 - Td or Tdap) 12/21/2030   Pneumonia Vaccine 65+ Years  old  Completed   DEXA SCAN  Completed   Zoster Vaccines- Shingrix  Completed   HPV VACCINES  Aged Out    Health Maintenance  Health Maintenance Due  Topic Date Due   COVID-19 Vaccine (5 - 2023-24 season) 09/21/2021    Colorectal cancer screening: No longer required.   Mammogram status: No longer required due to age.  Bone Density status: Completed 08/12/2018.   Lung Cancer Screening: (Low Dose CT Chest recommended if Age 33-80 years, 20 pack-year currently smoking OR have quit w/in 15years.) does not qualify.   Lung Cancer Screening Referral: no  Additional Screening:  Hepatitis C Screening: does not qualify;  Vision Screening: Recommended annual ophthalmology exams for early detection of glaucoma and other disorders of the eye. Is the patient up to date with their annual  eye exam?  Yes  Who is the provider or what is the name of the office in which the patient attends annual eye exams? [facility] If pt is not established with a provider, would they like to be referred to a provider to establish care? No .   Dental Screening: Recommended annual dental exams for proper oral hygiene  Diabetic Foot Exam: n/a  Community Resource Referral / Chronic Care Management: CRR required this visit?  No   CCM required this visit?  No     Plan:     I have personally reviewed and noted the following in the patient's chart:   Medical and social history Use of alcohol, tobacco or illicit drugs  Current medications and supplements including opioid prescriptions. Patient is not currently taking opioid prescriptions. Functional ability and status Nutritional status Physical activity Advanced directives List of other physicians Hospitalizations, surgeries, and ER visits in previous 12 months Vitals Screenings to include cognitive, depression, and falls Referrals and appointments  In addition, I have reviewed and discussed with patient certain preventive protocols, quality  metrics, and best practice recommendations. A written personalized care plan for preventive services as well as general preventive health recommendations were provided to patient.     Barb Merino, LPN   02/20/8655   After Visit Summary: in person  Nurse Notes: none

## 2022-08-07 NOTE — Patient Instructions (Signed)
Whitney Henderson , Thank you for taking time to come for your Medicare Wellness Visit. I appreciate your ongoing commitment to your health goals. Please review the following plan we discussed and let me know if I can assist you in the future.   These are the goals we discussed:  Goals      I am worried about mother having another UTI     Care Coordination Interventions: Assessed the Patient understanding of chronic kidney disease    Reviewed prescribed diet continue to increase daily water intake to 48-64 oz daily unless otherwise directed Advised patient to discuss persistent urinary incontinence  with provider    Report new symptoms or concerns to PCP promptly for early diagnosis/treatment         Manage My Medicine     Timeframe:  Long-Range Goal Priority:  High Start Date:                             Expected End Date:                       Follow Up Date 05/25/2021  In Progress:   - call for medicine refill 2 or 3 days before it runs out - keep a list of all the medicines I take; vitamins and herbals too - use a pillbox to sort medicine - use an alarm clock or phone to remind me to take my medicine    Why is this important?   These steps will help you keep on track with your medicines. Please call if you have any questions about your medication       Management of Anxiety symptoms     Care Coordination Interventions: Active listening / Reflection utilized  Emotional Support Provided Provided psychoeducation for mental health needs:Grief support  Verbalization of feelings encouraged  Pt reports "pretty good" management of anxiety symptoms. Trigger is being alone and extensive grief LCSW provided support with pt identifying healthy coping skills to assist with management of symptoms. Pt enjoys reading, exercising at [facility], attending church, garden club Pt is in need of transportation resources. LCSW provided supportive resources, including, SCAT application and contact # for  Medicare to inquire about their transportation program       Patient Stated     12/01/2018, wants to keep up with health     Patient Stated     12/02/2019, wants to feel good     Patient Stated     06/29/2020, feel better     Patient Stated     07/19/2021, try to stay healthy     Patient Stated     08/07/2022, to keep balance and improve water intake and diet     Pharmacy Care Plan     CARE PLAN ENTRY (see longitudinal plan of care for additional care plan information)  Current Barriers:  Chronic Disease Management support, education, and care coordination needs related to Hypertension, Hyperlipidemia, Depression, and Osteoporosis   Hypertension BP Readings from Last 3 Encounters:  01/06/20 138/80  12/02/19 126/74  12/02/19 126/74   Pharmacist Clinical Goal(s): Over the next 90 days, patient will work with PharmD and providers to maintain BP goal <140/90 Current regimen:  Amlodipine 2.5 mg tablet daily Interventions: Patient will take medication at same time each day Will help patient determine which BP monitor is covered through [facility]  catalog with a smaller cuff  Patient self care activities -  Over the next 90 days, patient will: Check BP once a day, document, and provide at future appointments Ensure daily salt intake < 2300 mg/day  Hyperlipidemia Lab Results  Component Value Date/Time   LDLCALC 103 (H) 11/05/2018 10:59 AM   Pharmacist Clinical Goal(s): Over the next 90 days, patient will work with PharmD and providers to maintain LDL goal < 100 Current regimen:  Not taking any medication  Interventions: Increasing the amount of exercise that she does by walking with daughter 3 or more times per week Decreasing the amount of fried fatty food the patient eats  Patient self care activities - Over the next 90 days, patient will: Walk with her daughter at least three times per week   Anxiety Pharmacist Clinical Goal(s): Over the next 90 days, patient will work  with PharmD and providers to maintain energy to do things  Current regimen:  Sertraline 25 mg tablet daily Interventions: The importance of medication adherence everyday  Patient self care activities - Over the next 90 days, patient will: Take medication everyday Osteoperosis Pharmacist Clinical Goal(s) Over the next 90 days, patient will work with PharmD and providers to take medication everyday Current regimen:  Calcium 500 + D- taking 1 tablet every other day  Cholecalciferol- Taking 1 tablet by mouth at bedtime. Interventions: Consider intake of 1200mg  of calcium daily through diet and/or supplementation DEXA scan Consider intake of [phone] units of vitamin D through supplementation  Patient self care activities - Over the next 90 days, patient will: Continue to take her medication every day  Have a DEXA scan  Medication management Pharmacist Clinical Goal(s): Over the next 90 days, patient will work with PharmD and providers to maintain optimal medication adherence Current pharmacy: [facility] Interventions Comprehensive medication review performed. Continue current medication management strategy Patient self care activities - Over the next 90 days, patient will: Focus on medication adherence by using pill packs and keeping a routine Take medications as prescribed Report any questions or concerns to PharmD and/or provider(s)  Initial goal documentation      To improve ankle edema following EVENITY injection     Care Coordination Interventions: Evaluation of current treatment plan related to Osteoporosis and patient's adherence to plan as established by provider Determined patient is experiencing mild bilateral ankle edema following her EVENITY injection for Osteoporosis Educated daughter and patient regarding the symptom reported to be noted as a common SE that may occur with use of this medication  Instructed patient/daughter to elevate lower extremities while resting  and apply compression socks if available, eat a low Sodium diet and to inform patient's doctor of persistent or worsening symptoms  Reviewed next upcoming scheduled AWV and PCP follow up with Dr. [name] scheduled for 08/07/22 @11 :00/11:40 AM           This is a list of the screening recommended for you and due dates:  Health Maintenance  Topic Date Due   COVID-19 Vaccine (5 - 2023-24 season) 09/21/2021   Flu Shot  08/22/2022   Medicare Annual Wellness Visit  08/07/2023   DTaP/Tdap/Td vaccine (2 - Td or Tdap) 12/21/2030   Pneumonia Vaccine  Completed   DEXA scan (bone density measurement)  Completed   Zoster (Shingles) Vaccine  Completed   HPV Vaccine  Aged Out    Advanced directives: Please bring a copy of your POA (Power of [place]) and/or Living Will to your next appointment.   Conditions/risks identified: none  Next appointment: Follow up in one year for your  annual wellness visit    Preventive Care 65 Years and Older, Female Preventive care refers to lifestyle choices and visits with your health care provider that can promote health and wellness. What does preventive care include? A yearly physical exam. This is also called an annual well check. Dental exams once or twice a year. Routine eye exams. Ask your health care provider how often you should have your eyes checked. Personal lifestyle choices, including: Daily care of your teeth and gums. Regular physical activity. Eating a healthy diet. Avoiding tobacco and drug use. Limiting alcohol use. Practicing safe sex. Taking low-dose aspirin every day. Taking vitamin and mineral supplements as recommended by your health care provider. What happens during an annual well check? The services and screenings done by your health care provider during your annual well check will depend on your age, overall health, lifestyle risk factors, and family history of disease. Counseling  Your health care provider may ask you  questions about your: Alcohol use. Tobacco use. Drug use. Emotional well-being. Home and relationship well-being. Sexual activity. Eating habits. History of falls. Memory and ability to understand (cognition). Work and work Astronomer. Reproductive health. Screening  You may have the following tests or measurements: Height, weight, and BMI. Blood pressure. Lipid and cholesterol levels. These may be checked every 5 years, or more frequently if you are over 8 years old. Skin check. Lung cancer screening. You may have this screening every year starting at age 5 if you have a 30-pack-year history of smoking and currently smoke or have quit within the past 15 years. Fecal occult blood test (FOBT) of the stool. You may have this test every year starting at age 42. Flexible sigmoidoscopy or colonoscopy. You may have a sigmoidoscopy every 5 years or a colonoscopy every 10 years starting at age 82. Hepatitis C blood test. Hepatitis B blood test. Sexually transmitted disease (STD) testing. Diabetes screening. This is done by checking your blood sugar (glucose) after you have not eaten for a while (fasting). You may have this done every 1-3 years. Bone density scan. This is done to screen for osteoporosis. You may have this done starting at age 78. Mammogram. This may be done every 1-2 years. Talk to your health care provider about how often you should have regular mammograms. Talk with your health care provider about your test results, treatment options, and if necessary, the need for more tests. Vaccines  Your health care provider may recommend certain vaccines, such as: Influenza vaccine. This is recommended every year. Tetanus, diphtheria, and acellular pertussis (Tdap, Td) vaccine. You may need a Td booster every 10 years. Zoster vaccine. You may need this after age 37. Pneumococcal 13-valent conjugate (PCV13) vaccine. One dose is recommended after age 63. Pneumococcal polysaccharide  (PPSV23) vaccine. One dose is recommended after age 57. Talk to your health care provider about which screenings and vaccines you need and how often you need them. This information is not intended to replace advice given to you by your health care provider. Make sure you discuss any questions you have with your health care provider. Document Released: 02/03/2015 Document Revised: 09/27/2015 Document Reviewed: 11/08/2014 Elsevier Interactive Patient Education  2017 [employer].  Fall Prevention in the Home Falls can cause injuries. They can happen to people of all ages. There are many things you can do to make your home safe and to help prevent falls. What can I do on the outside of my home? Regularly fix the edges of walkways and  driveways and fix any cracks. Remove anything that might make you trip as you walk through a door, such as a raised step or threshold. Trim any bushes or trees on the path to your home. Use bright outdoor lighting. Clear any walking paths of anything that might make someone trip, such as rocks or tools. Regularly check to see if handrails are loose or broken. Make sure that both sides of any steps have handrails. Any raised decks and porches should have guardrails on the edges. Have any leaves, snow, or ice cleared regularly. Use sand or salt on walking paths during winter. Clean up any spills in your garage right away. This includes oil or grease spills. What can I do in the bathroom? Use night lights. Install grab bars by the toilet and in the tub and shower. Do not use towel bars as grab bars. Use non-skid mats or decals in the tub or shower. If you need to sit down in the shower, use a plastic, non-slip stool. Keep the floor dry. Clean up any water that spills on the floor as soon as it happens. Remove soap buildup in the tub or shower regularly. Attach bath mats securely with double-sided non-slip rug tape. Do not have throw rugs and other things on the  floor that can make you trip. What can I do in the bedroom? Use night lights. Make sure that you have a light by your bed that is easy to reach. Do not use any sheets or blankets that are too big for your bed. They should not hang down onto the floor. Have a firm chair that has side arms. You can use this for support while you get dressed. Do not have throw rugs and other things on the floor that can make you trip. What can I do in the kitchen? Clean up any spills right away. Avoid walking on wet floors. Keep items that you use a lot in easy-to-reach places. If you need to reach something above you, use a strong step stool that has a grab bar. Keep electrical cords out of the way. Do not use floor polish or wax that makes floors slippery. If you must use wax, use non-skid floor wax. Do not have throw rugs and other things on the floor that can make you trip. What can I do with my stairs? Do not leave any items on the stairs. Make sure that there are handrails on both sides of the stairs and use them. Fix handrails that are broken or loose. Make sure that handrails are as long as the stairways. Check any carpeting to make sure that it is firmly attached to the stairs. Fix any carpet that is loose or worn. Avoid having throw rugs at the top or bottom of the stairs. If you do have throw rugs, attach them to the floor with carpet tape. Make sure that you have a light switch at the top of the stairs and the bottom of the stairs. If you do not have them, ask someone to add them for you. What else can I do to help prevent falls? Wear shoes that: Do not have high heels. Have rubber bottoms. Are comfortable and fit you well. Are closed at the toe. Do not wear sandals. If you use a stepladder: Make sure that it is fully opened. Do not climb a closed stepladder. Make sure that both sides of the stepladder are locked into place. Ask someone to hold it for you, if possible. Clearly mark  and make  sure that you can see: Any grab bars or handrails. First and last steps. Where the edge of each step is. Use tools that help you move around (mobility aids) if they are needed. These include: Canes. Walkers. Scooters. Crutches. Turn on the lights when you go into a dark area. Replace any light bulbs as soon as they burn out. Set up your furniture so you have a clear path. Avoid moving your furniture around. If any of your floors are uneven, fix them. If there are any pets around you, be aware of where they are. Review your medicines with your doctor. Some medicines can make you feel dizzy. This can increase your chance of falling. Ask your doctor what other things that you can do to help prevent falls. This information is not intended to replace advice given to you by your health care provider. Make sure you discuss any questions you have with your health care provider. Document Released: 11/03/2008 Document Revised: 06/15/2015 Document Reviewed: 02/11/2014 Elsevier Interactive Patient Education  2017 [employer].

## 2022-08-09 LAB — PTH, INTACT AND CALCIUM
Calcium: 8.8 mg/dL (ref 8.7–10.3)
PTH: 43 pg/mL (ref 15–65)

## 2022-08-09 LAB — PROTEIN ELECTROPHORESIS, SERUM
A/G Ratio: 0.8 (ref 0.7–1.7)
Albumin ELP: 3.3 g/dL (ref 2.9–4.4)
Alpha 1: 0.3 g/dL (ref 0.0–0.4)
Alpha 2: 1 g/dL (ref 0.4–1.0)
Beta: 1.2 g/dL (ref 0.7–1.3)
Gamma Globulin: 1.6 g/dL (ref 0.4–1.8)
Globulin, Total: 4.1 g/dL — ABNORMAL HIGH (ref 2.2–3.9)
Total Protein: 7.4 g/dL (ref 6.0–8.5)

## 2022-08-09 LAB — VITAMIN B12: Vitamin B-12: 714 pg/mL (ref 232–1245)

## 2022-08-09 LAB — PHOSPHORUS: Phosphorus: 3 mg/dL (ref 3.0–4.3)

## 2022-08-11 DIAGNOSIS — M545 Low back pain, unspecified: Secondary | ICD-10-CM | POA: Insufficient documentation

## 2022-08-11 NOTE — Assessment & Plan Note (Signed)
Her weight is stable over the past month. Both she and her daughter state she has a great appetite.

## 2022-08-11 NOTE — Assessment & Plan Note (Signed)
Large callus on right foot is present, they agree to Podiatry evaluatation. Reminded to use moisturizing lotion on feet daily.

## 2022-08-11 NOTE — Assessment & Plan Note (Signed)
Stage 3b. I will check labs as below. Chronic, she is encouraged to stay well hydrated, avoid NSAIDs and keep BP controlled to prevent progression of CKD.

## 2022-08-11 NOTE — Assessment & Plan Note (Addendum)
Chronic, controlled. She will c/2 amlodipine 2.5mg  twice daily(she did not tolerate once daily dosing).  She is encouraged to follow low sodium diet. She will f/u in 4-6 months for re-evaluation. Rheum renal labs reviewed, no need to repeat today.

## 2022-08-13 ENCOUNTER — Ambulatory Visit (INDEPENDENT_AMBULATORY_CARE_PROVIDER_SITE_OTHER): Payer: Medicare Other

## 2022-08-13 VITALS — BP 168/80 | HR 58 | Temp 97.8°F | Resp 16 | Ht 62.0 in | Wt 118.2 lb

## 2022-08-13 DIAGNOSIS — M8000XD Age-related osteoporosis with current pathological fracture, unspecified site, subsequent encounter for fracture with routine healing: Secondary | ICD-10-CM | POA: Diagnosis not present

## 2022-08-13 DIAGNOSIS — S72002D Fracture of unspecified part of neck of left femur, subsequent encounter for closed fracture with routine healing: Secondary | ICD-10-CM

## 2022-08-13 DIAGNOSIS — M8000XA Age-related osteoporosis with current pathological fracture, unspecified site, initial encounter for fracture: Secondary | ICD-10-CM

## 2022-08-13 MED ORDER — ROMOSOZUMAB-AQQG 105 MG/1.17ML ~~LOC~~ SOSY
210.0000 mg | PREFILLED_SYRINGE | Freq: Once | SUBCUTANEOUS | Status: AC
  Administered 2022-08-13: 210 mg via SUBCUTANEOUS

## 2022-08-13 NOTE — Progress Notes (Signed)
Diagnosis: Osteoporosis  Provider:  Chilton Greathouse MD  Procedure: Injection  Evenity ([id number]), Dose: 210 mg, Site: subcutaneous, Number of injections: 2   Discharge: Condition: Good, Destination: Home . AVS Provided  Performed by:  Nat Math, RN

## 2022-08-13 NOTE — Patient Instructions (Signed)
Romosozumab Injection What is this medication? ROMOSOZUMAB (roe moe SOZ ue mab) prevents and treats osteoporosis. It works by Paramedic stronger and less likely to break (fracture). It is a monoclonal antibody. This medicine may be used for other purposes; ask your health care provider or pharmacist if you have questions. COMMON BRAND NAME(S): EVENITY What should I tell my care team before I take this medication? They need to know if you have any of these conditions: Dental disease Heart attack Heart disease Kidney problems Low levels of calcium in the blood On dialysis Stroke Wear dentures An unusual or allergic reaction to romosozumab, other medications, foods, dyes or preservatives Pregnant or trying to get pregnant Breast-feeding How should I use this medication? This medication is injected under the skin. It is given by your care team in a hospital or clinic setting. A special MedGuide will be given to you by the pharmacist with each prescription and refill. Be sure to read this information carefully each time. Talk to your care team about the use of this medication in children. Special care may be needed. Overdosage: If you think you have taken too much of this medicine contact a poison control center or emergency room at once. NOTE: This medicine is only for you. Do not share this medicine with others. What if I miss a dose? Keep appointments for follow-up doses. It is important not to miss your dose. Call your care team if you are unable to keep an appointment. What may interact with this medication? Interactions are not expected. This list may not describe all possible interactions. Give your health care provider a list of all the medicines, herbs, non-prescription drugs, or dietary supplements you use. Also tell them if you smoke, drink alcohol, or use illegal drugs. Some items may interact with your medicine. What should I watch for while using this medication? Your  condition will be monitored carefully while you are receiving this medication. You may need bloodwork while taking this medication. You should make sure you get enough calcium and vitamin D while you are taking this medication. Discuss the foods you eat and the vitamins you take with your care team. Some people who take this medication have severe bone, joint, or muscle pain. This medication may also increase your risk for jaw problems or a broken thigh bone. Tell your care team right away if you have severe pain in your jaw, bones, joints, or muscles. Tell you care team if you have any pain that does not go away or that gets worse. Tell your dentist and dental surgeon that you are taking this medication. You should not have major dental surgery while on this medication. See your dentist to have a dental exam and fix any dental problems before starting this medication. Take good care of your teeth while on this medication. Make sure you see your dentist for regular follow-up appointments. What side effects may I notice from receiving this medication? Side effects that you should report to your care team as soon as possible: Allergic reactions or angioedema--skin rash, itching or hives, swelling of the face, eyes, lips, tongue, arms, or legs, trouble swallowing or breathing Heart attack--pain or tightness in the chest, shoulders, arms, or jaw, nausea, shortness of breath, cold or clammy skin, feeling faint or lightheaded Low calcium level--muscle pain or cramps, confusion, tingling, or numbness in the hands or feet Osteonecrosis of the jaw--pain, swelling, or redness in the mouth, numbness of the jaw, poor healing after dental work,  unusual discharge from the mouth, visible bones in the mouth Severe bone, joint, or muscle pain Stroke--sudden numbness or weakness of the face, arm, or leg, trouble speaking, confusion, trouble walking, loss of balance or coordination, dizziness, severe headache, change in  vision Side effects that usually do not require medical attention (report to your care team if they continue or are bothersome): Headache Joint pain Muscle spasms Pain, redness, or irritation at injection site Swelling of the ankles, hands, or feet This list may not describe all possible side effects. Call your doctor for medical advice about side effects. You may report side effects to FDA at 1-800-FDA-1088. Where should I keep my medication? This medication is given in a hospital or clinic. It will not be stored at home. NOTE: This sheet is a summary. It may not cover all possible information. If you have questions about this medicine, talk to your doctor, pharmacist, or health care provider.  2024 Elsevier/Gold Standard (2021-02-07 00:00:00)

## 2022-08-19 NOTE — Assessment & Plan Note (Signed)
Chronic, she is currently followed by Rheumatology. She is encouraged to perform stretching exercises while

## 2022-08-22 ENCOUNTER — Ambulatory Visit: Payer: Medicare Other | Admitting: Podiatry

## 2022-08-22 DIAGNOSIS — M79674 Pain in right toe(s): Secondary | ICD-10-CM

## 2022-08-22 DIAGNOSIS — L84 Corns and callosities: Secondary | ICD-10-CM | POA: Diagnosis not present

## 2022-08-22 DIAGNOSIS — B351 Tinea unguium: Secondary | ICD-10-CM

## 2022-08-22 DIAGNOSIS — M19071 Primary osteoarthritis, right ankle and foot: Secondary | ICD-10-CM

## 2022-08-22 DIAGNOSIS — M79675 Pain in left toe(s): Secondary | ICD-10-CM | POA: Diagnosis not present

## 2022-08-22 NOTE — Progress Notes (Signed)
  Subjective:  Patient ID: Whitney Henderson, female    DOB: 1928-11-21,  MRN: [id number]  Chief Complaint  Patient presents with   Nail Problem    "It's the big toe, it swells.  I have this place on my heel." N - callus and toenail painful L - lateral heel and hallux right D - 2 mos O - off and on C - nail-sore and swelling, callus - sore A - shoe T - Antibiotic cream    89 y.o. female presents with the above complaint. History confirmed with patient.  Her nails are also thickened elongated causing pain and discomfort they are discolored and have debris underneath  Objective:  Physical Exam: warm, good capillary refill, no trophic changes or ulcerative lesions, normal DP and PT pulses, and normal sensory exam. Left Foot: dystrophic yellowed discolored nail plates with subungual debris Right Foot: soft tissue swelling noted over the first metatarsal phalangeal joint with crepitance, dystrophic yellowed discolored nail plates with subungual debris, and large callus lateral heel   Assessment:   1. Pain due to onychomycosis of toenails of both feet   2. Callus of foot   3. Arthritis of first metatarsophalangeal (MTP) joint of right foot      Plan:  Patient was evaluated and treated and all questions answered.  Discussed the etiology and treatment options for the condition in detail with the patient. Recommended debridement of the nails today. Sharp and mechanical debridement performed of all painful and mycotic nails today. Nails debrided in length and thickness using a nail nipper to level of comfort. Discussed treatment options including appropriate shoe gear. Follow up as needed for painful nails.  All symptomatic hyperkeratoses were safely debrided as a courtesy with a sterile #15 blade to patient's level of comfort without incident. We discussed preventative and palliative care of these lesions including supportive and accommodative shoegear, padding, prefabricated and custom  molded accommodative orthoses, use of a pumice stone and lotions/creams daily.  I recommended using urea cream and a pumice stone and they will get this OTC  We discussed the pain and swelling she is having in the right first metatarsal phalangeal joint.  She does have some crepitus.  I suspect significant arthritis.  I do not think surgical correction would be an option for her.  We discussed corticosteroid injection.  She will let me know if she will need this otherwise they are going to try Voltaren gel as needed prior to this.  Return as needed for arthritis in her joint to see me  Return in about 3 months (around 11/22/2022) for painful thick fungal nails.

## 2022-08-22 NOTE — Patient Instructions (Addendum)
Look for urea 40% cream or ointment and apply to the thickened dry skin / calluses. This can be bought over the counter, at a pharmacy or online such as [employer].  Tuli's heel cups can be purchased as well to cushion the heel and offload the area

## 2022-08-24 DIAGNOSIS — Z9181 History of falling: Secondary | ICD-10-CM | POA: Diagnosis not present

## 2022-08-24 DIAGNOSIS — Z96642 Presence of left artificial hip joint: Secondary | ICD-10-CM | POA: Diagnosis not present

## 2022-08-24 DIAGNOSIS — S72002D Fracture of unspecified part of neck of left femur, subsequent encounter for closed fracture with routine healing: Secondary | ICD-10-CM | POA: Diagnosis not present

## 2022-08-24 DIAGNOSIS — R2689 Other abnormalities of gait and mobility: Secondary | ICD-10-CM | POA: Diagnosis not present

## 2022-08-24 DIAGNOSIS — M6281 Muscle weakness (generalized): Secondary | ICD-10-CM | POA: Diagnosis not present

## 2022-09-02 ENCOUNTER — Encounter: Payer: Self-pay | Admitting: Neurology

## 2022-09-02 ENCOUNTER — Telehealth: Payer: Self-pay | Admitting: Neurology

## 2022-09-02 ENCOUNTER — Ambulatory Visit: Payer: Medicare Other | Admitting: Neurology

## 2022-09-02 VITALS — BP 171/80 | HR 64 | Ht 62.0 in | Wt 115.2 lb

## 2022-09-02 DIAGNOSIS — R4189 Other symptoms and signs involving cognitive functions and awareness: Secondary | ICD-10-CM

## 2022-09-02 DIAGNOSIS — R269 Unspecified abnormalities of gait and mobility: Secondary | ICD-10-CM

## 2022-09-02 DIAGNOSIS — G20C Parkinsonism, unspecified: Secondary | ICD-10-CM

## 2022-09-02 DIAGNOSIS — R441 Visual hallucinations: Secondary | ICD-10-CM

## 2022-09-02 MED ORDER — CARBIDOPA-LEVODOPA 25-100 MG PO TABS: 1.0000 | ORAL_TABLET | Freq: Three times a day (TID) | ORAL | 11 refills | Status: DC

## 2022-09-02 MED ORDER — RIVASTIGMINE 4.6 MG/24HR TD PT24: 4.6000 mg | MEDICATED_PATCH | Freq: Every day | TRANSDERMAL | 12 refills | Status: DC

## 2022-09-02 NOTE — Progress Notes (Signed)
Chief Complaint  Patient presents with   Follow-up    Rm14, daughter (velma) present  Mmse:19/30 Tremors:bilateral hands ongoing       ASSESSMENT AND PLAN  Whitney Henderson is a 89 y.o. 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   MEDICAL HISTORY:  Whitney Henderson is a 89 year old female, accompanied by her daughter, to follow-up for memory loss, gait abnormality, her primary care physician is Dr. [name], [facility]   I reviewed and summarized the referring note. PMHX HTN Hip replacement left in Oct 2023  She is a retired Chartered loss adjuster, was under the care of of Dr. Anne Hahn, later Maralyn Sago for mild cognitive impairment, previously tried Aricept for a while, but concern for weight loss, was stopped,  Since her left hip replacement in October 2023, daughter moved in with her, no longer have significant left hip pain, but noticed mild gait abnormality  Over the past couple years, she also developed right hand tremor, talking out of dreams, occasionally visual hallucinations, usually worse with her urinary tract infection,  She is sedentary most of the time, watch TV, denies bowel and bladder incontinence, has good appetite, sleeps okay,   Personally reviewed CT head October 2023, mild generalized atrophy  CT cervical spine mild degenerative changes no acute abnormality  Lab in 2024, normal PTH, calcium, B12, vitamin D, creatinine 1.34, CBC  10.8   PHYSICAL EXAM:   Vitals:   09/02/22 0942 09/02/22 0952  BP: (!) 167/77 (!) 171/80  Pulse: 64   Weight: 115 lb 3.2 oz (52.3 kg)   Height: 5\' 2"  (1.575 m)      Body mass index is 21.07 kg/m.  PHYSICAL EXAMNIATION:  Gen: NAD, conversant, well nourised, well groomed                     Cardiovascular: Regular rate rhythm, no peripheral edema, warm, nontender. Eyes: Conjunctivae clear without exudates or hemorrhage Neck: Supple, no carotid bruits. Pulmonary: Clear to auscultation bilaterally   NEUROLOGICAL EXAM:  MENTAL STATUS: Speech/cognition: Masked face, soft voice    09/02/2022    9:48 AM 05/10/2021   10:48 AM 09/07/2020   10:51 AM  MMSE - Mini Mental State Exam  Orientation to time 3 4 4   Orientation to Place 5 5 5   Registration 3 3 3   Attention/ Calculation 0 0 3  Recall 0 1 0  Language- name 2 objects 2 2 2   Language- repeat 1 1 1   Language- follow 3 step command 3 3 3   Language- read & follow direction 1 1 1   Write a sentence 1 1 1   Copy design 0 1 0  Total score 19 22 23     CRANIAL NERVES: CN II: Visual fields are full to confrontation. Pupils are round equal and briskly reactive to light. CN III, IV, VI: extraocular movement are normal. No ptosis. CN V: Facial sensation is intact to light touch CN VII: Face is symmetric with normal eye closure  CN VIII: Hearing is  normal to causal conversation. CN IX, X: Phonation is normal. CN XI: Head turning and shoulder shrug are intact  MOTOR: Right hand resting tremor, right more than left rigidity, bradykinesia  REFLEXES: Reflexes are 1 and symmetric at the biceps, triceps, knees, and ankles. Plantar responses are flexor.  SENSORY: Intact to light touch, pinprick and vibratory sensation are intact in fingers and toes.  COORDINATION: There is no trunk or limb dysmetria noted.  GAIT/STANCE: Need push-up to get up from seated position, scoliosis, cautious, decreased stride, decreased right arm  swing,  REVIEW OF SYSTEMS:  Full 14 system review of systems performed and notable only for as above All other review of systems were negative.   ALLERGIES: Allergies  Allergen Reactions   Fish Allergy Other (See Comments)    Does not do well with Seafood.   Codeine Rash    HOME MEDICATIONS: Current Outpatient Medications  Medication Sig Dispense Refill   acetaminophen (TYLENOL) 500 MG tablet Take 500 mg by mouth every 6 (six) hours as needed for mild pain or moderate pain.     amLODipine (NORVASC) 2.5 MG tablet Take 1 tablet (2.5 mg total) by mouth 2 (two) times daily. One tab po twice daily with breakfast and dinner 180 tablet 1   Apoaequorin (PREVAGEN PO) Take 1 capsule by mouth every other day.     aspirin 81 MG chewable tablet Chew 1 tablet (81 mg total) by mouth 2 (two) times daily. 30 tablet 0   betamethasone dipropionate (DIPROLENE) 0.05 % cream APPLY THIN LAYER EXTERNALLY TO THE AFFECTED AREA EVERY DAY AS NEEDED (Patient taking differently: 1 application  daily as needed (skin irritation).) 60 g 2   Calcium Carb-Cholecalciferol (CALCIUM 500 +D PO) Take 1 tablet by mouth every other day.     Multiple Vitamins-Minerals (CENTRUM SILVER PO) Take 1 tablet by mouth every other day.     Propylene Glycol (SYSTANE BALANCE OP) Place 1 drop into both eyes daily as needed (for itchy eyes).     sertraline (ZOLOFT) 25 MG tablet TAKE 1 TABLET(25 MG) BY MOUTH DAILY 90 tablet 1   thiamine 100 MG tablet Take 100 mg by mouth daily.     triamcinolone cream (KENALOG) 0.1 % APPLY TO AFFECTED AREA TWICE DAILY AS NEEDED (Patient taking differently: Apply 1 Application topically 2 (two) times daily as needed (for flares).) 45 g 0   vitamin C (ASCORBIC ACID) 500 MG tablet Take 500 mg by mouth every other day.     No current facility-administered medications for this visit.    PAST MEDICAL HISTORY: Past Medical History:  Diagnosis Date   Anxiety    Cataracts, bilateral    Discoid lupus     Headache 04/27/2015   Left>right periorbital   Hypertension    Memory difficulties 09/04/2016    PAST SURGICAL HISTORY: Past Surgical History:  Procedure Laterality Date   ABDOMINAL HYSTERECTOMY     BREAST EXCISIONAL BIOPSY Left over 20 ys ago   benign   CHOLECYSTECTOMY     EXCISIONAL HEMORRHOIDECTOMY     TOTAL HIP ARTHROPLASTY Left 11/03/2021   Procedure: LEFT HEMI HIP ARTHROPLASTY ANTERIOR APPROACH;  Surgeon: Kathryne Hitch, MD;  Location: [facility] OR;  Service: Orthopedics;  Laterality: Left;    FAMILY HISTORY: Family History  Problem Relation Age of Onset   Early death Mother    Cancer Father    Thyroid disease Daughter    Breast cancer Daughter        19s   Glaucoma  Daughter    Hypertension Daughter    Heart attack Son    Thyroid disease Son     SOCIAL HISTORY: Social History   Socioeconomic History   Marital status: Widowed    Spouse name: Ivar Drape    Number of children: 4   Years of education: College- Masters   [employer] education level: Not on file  Occupational History   Not on file  Tobacco Use   Smoking status: Former    Types: Cigarettes    Passive exposure: Never   Smokeless tobacco: Never   Tobacco comments:    smoked sometimes not daily  Vaping Use   Vaping status: Never Used  Substance and Sexual Activity   Alcohol use: No   Drug use: No   Sexual activity: Not Currently  Other Topics Concern   Not on file  Social History Narrative   Live with husband,  4 children.    Caffeine: 1 cup daily    Social Determinants of Health   Financial Resource Strain: Low Risk  (08/07/2022)   Overall Financial Resource Strain (CARDIA)    Difficulty of Paying Living Expenses: Not hard at all  Food Insecurity: No Food Insecurity (08/07/2022)   Hunger Vital Sign    Worried About Running Out of Food in the Last Year: Never true    Ran Out of Food in the Last Year: Never true  Transportation Needs: No Transportation Needs (08/07/2022)   PRAPARE -  Administrator, Civil Service (Medical): No    Lack of Transportation (Non-Medical): No  Physical Activity: Sufficiently Active (08/07/2022)   Exercise Vital Sign    Days of Exercise per Week: 7 days    Minutes of Exercise per Session: 30 min  Stress: Stress Concern Present (08/07/2022)   [employer] of Occupational Health - Occupational Stress Questionnaire    Feeling of Stress : To some extent  Social Connections: Socially Isolated (08/07/2022)   Social Connection and Isolation Panel [NHANES]    Frequency of Communication with Friends and Family: Twice a week    Frequency of Social Gatherings with Friends and Family: Never    Attends Religious Services: More than 4 times per year    Active Member of [employer] or Organizations: No    Attends Banker Meetings: Never    Marital Status: Widowed  Intimate Partner Violence: Not At Risk (08/07/2022)   Humiliation, Afraid, Rape, and Kick questionnaire    Fear of Current or Ex-Partner: No    Emotionally Abused: No    Physically Abused: No    Sexually Abused: No      Levert Feinstein, M.D. Ph.D.  [facility] [address] [place], [place] [address] Ph: [phone] Fax: [phone]  CC:  [name], MD [address] [place],  [place] [address]  [name], MD

## 2022-09-02 NOTE — Telephone Encounter (Signed)
[facility] is taking this patient 

## 2022-09-03 NOTE — Telephone Encounter (Signed)
[facility] is asking what type of nursing services are needed for this patient. Med management, etc? Or are only PT services needed?

## 2022-09-04 ENCOUNTER — Ambulatory Visit: Payer: Self-pay

## 2022-09-04 NOTE — Telephone Encounter (Signed)
Physical therapy only

## 2022-09-04 NOTE — Patient Instructions (Signed)
Visit Information  Thank you for taking time to visit with me today. Please don't hesitate to contact me if I can be of assistance to you.   Following are the goals we discussed today:   Goals Addressed             This Visit's Progress    To start in home PT to work on strengthening and balance       Care Coordination Interventions: Evaluation of current treatment plan related to abnormal gait and patient's adherence to plan as established by provider Determined patient completed a visit with Dr. [name], Neurologist for evaluation of abnormal gait, tremors, change in memory and intermittent hallucinations Review of patient status, including review of consultant's reports, relevant laboratory and other test results, and medications completed Reviewed and discussed MD referral for PT to work on gait, stair climbing and strengthening  Determined [facility] will provide patient with in home PT, an initial visit has been scheduled Advised patient of importance of notifying provider of falls Assessed for falls since last encounter Assessed patients knowledge of fall risk prevention secondary to previously provided education         Our next appointment is by telephone on 10/02/22 at 2:30 PM  Please call the care guide team at [phone] if you need to cancel or reschedule your appointment.   If you are experiencing a Mental Health or Behavioral Health Crisis or need someone to talk to, please call 1-800-273-TALK (toll free, 24 hour hotline)  The patient verbalized understanding of instructions, educational materials, and care plan provided today and DECLINED offer to receive copy of patient instructions, educational materials, and care plan.   Delsa Sale, RN, BSN, CCM Care Management Coordinator [facility] Care Management  Direct Phone: [phone]

## 2022-09-04 NOTE — Patient Outreach (Signed)
  Care Coordination   Follow Up Visit Note   09/04/2022 Name: Whitney Henderson MRN: [id number] DOB: [birth year]  Duffy Bruce Ogle is a 89 y.o. year old female who sees [name], MD for primary care. I spoke with  [name] and daughter [name] and [place] by phone today.  What matters to the patients health and wellness today?  Patient would like to start in home PT for strengthening and balance. She will start a new medication for Parkinson's disease.     Goals Addressed             This Visit's Progress    To start in home PT to work on strengthening and balance       Care Coordination Interventions: Evaluation of current treatment plan related to abnormal gait and patient's adherence to plan as established by provider Determined patient completed a visit with Dr. [name], Neurologist for evaluation of abnormal gait, tremors, change in memory and intermittent hallucinations Review of patient status, including review of consultant's reports, relevant laboratory and other test results, and medications completed Reviewed and discussed MD referral for PT to work on gait, stair climbing and strengthening  Determined [facility] will provide patient with in home PT, an initial visit has been scheduled Advised patient of importance of notifying provider of falls Assessed for falls since last encounter Assessed patients knowledge of fall risk prevention secondary to previously provided education    Interventions Today    Flowsheet Row Most Recent Value  Chronic Disease   Chronic disease during today's visit Other  [Parkinson's disease]  General Interventions   General Interventions Discussed/Reviewed General Interventions Discussed, General Interventions Reviewed, Doctor Visits  Doctor Visits Discussed/Reviewed Doctor Visits Discussed, Doctor Visits Reviewed, Specialist, PCP  Exercise Interventions   Exercise Discussed/Reviewed Physical Activity  Physical Activity  Discussed/Reviewed Physical Activity Discussed, Physical Activity Reviewed, Home Exercise Program (HEP)  Education Interventions   Education Provided Provided Education, Provided Printed Education  Provided Verbal Education On Medication, When to see the doctor  Mental Health Interventions   Mental Health Discussed/Reviewed Mental Health Discussed, Mental Health Reviewed, Other  [hallucinations, change in memory]  Pharmacy Interventions   Pharmacy Dicussed/Reviewed Pharmacy Topics Discussed, Pharmacy Topics Reviewed, Medications and their functions  Safety Interventions   Safety Discussed/Reviewed Safety Discussed, Safety Reviewed, Fall Risk, Home Safety  Home Safety Assistive Devices          SDOH assessments and interventions completed:  No     Care Coordination Interventions:  Yes, provided   Follow up plan: Follow up call scheduled for 10/02/22 @2 :30 PM    Encounter Outcome:  Pt. Visit Completed

## 2022-09-09 ENCOUNTER — Telehealth: Payer: Self-pay | Admitting: Neurology

## 2022-09-09 NOTE — Telephone Encounter (Signed)
Pt's daughter is asking for a call to discuss the needed PA for pt's rivastigmine (EXELON) 4.6 mg/24hr

## 2022-09-09 NOTE — Telephone Encounter (Signed)
Annabelle Harman, RN Development worker, international aid from [place] reports that they will not be able to see pt before 08-26, Annabelle Harman can be called back at [phone] if there are questions

## 2022-09-10 ENCOUNTER — Encounter: Payer: Self-pay | Admitting: Physician Assistant

## 2022-09-10 ENCOUNTER — Telehealth (HOSPITAL_COMMUNITY): Payer: Self-pay | Admitting: Pharmacy Technician

## 2022-09-10 ENCOUNTER — Other Ambulatory Visit (HOSPITAL_COMMUNITY): Payer: Self-pay

## 2022-09-10 NOTE — Telephone Encounter (Signed)
Pharmacy Patient Advocate Encounter   Received notification from Pt Calls Messages that prior authorization for Rivastigmine 4.6MG /24HR 24 hr patches is required/requested.   Insurance verification completed.   The patient is insured through [facility] .   Per test claim: PA required; PA submitted to [facility] via CoverMyMeds Key/confirmation #/EOC [id number] Status is pending

## 2022-09-10 NOTE — Telephone Encounter (Signed)
PA request has been Submitted. New Encounter created for follow up. For additional info see Pharmacy Prior Auth telephone encounter from 09/10/2022.

## 2022-09-12 ENCOUNTER — Other Ambulatory Visit (HOSPITAL_COMMUNITY): Payer: Self-pay

## 2022-09-12 ENCOUNTER — Ambulatory Visit (INDEPENDENT_AMBULATORY_CARE_PROVIDER_SITE_OTHER): Payer: Medicare Other | Admitting: *Deleted

## 2022-09-12 VITALS — BP 163/81 | HR 57 | Temp 97.6°F | Resp 14 | Ht 62.0 in | Wt 117.8 lb

## 2022-09-12 DIAGNOSIS — M8000XA Age-related osteoporosis with current pathological fracture, unspecified site, initial encounter for fracture: Secondary | ICD-10-CM | POA: Diagnosis not present

## 2022-09-12 DIAGNOSIS — S72002D Fracture of unspecified part of neck of left femur, subsequent encounter for closed fracture with routine healing: Secondary | ICD-10-CM | POA: Diagnosis not present

## 2022-09-12 MED ORDER — ROMOSOZUMAB-AQQG 105 MG/1.17ML ~~LOC~~ SOSY
210.0000 mg | PREFILLED_SYRINGE | Freq: Once | SUBCUTANEOUS | Status: AC
  Administered 2022-09-12: 210 mg via SUBCUTANEOUS
  Filled 2022-09-12: qty 2.34

## 2022-09-12 NOTE — Progress Notes (Signed)
Diagnosis: Osteoporosis  Provider:  Chilton Greathouse MD  Procedure: Injection  Evenity (Romosozumab-aqqg), Dose: 210 mg, Site: subcutaneous, Number of injections: 2  Post Care: Observation period completed  Discharge: Condition: Good, Destination: Home . AVS Provided  Performed by:  Forrest Moron, RN

## 2022-09-12 NOTE — Telephone Encounter (Signed)
Pharmacy Patient Advocate Encounter  Received notification from [facility] that Prior Authorization for Rivastigmine 4.6MG /24HR 24 hr patches has been APPROVED from 09/10/2022 to 01/21/2023. Ran test claim, Copay is $100.00 per 30DS. This test claim was processed through [facility]- copay amounts may vary at other pharmacies due to pharmacy/plan contracts, or as the patient moves through the different stages of their insurance plan.   PA #/Case ID/Reference #: PA Case ID #: [id number]

## 2022-09-13 ENCOUNTER — Ambulatory Visit: Payer: Medicare Other

## 2022-09-16 ENCOUNTER — Telehealth: Payer: Self-pay

## 2022-09-16 NOTE — Patient Outreach (Signed)
  Care Coordination   09/16/2022 Name: Whitney Henderson MRN: [id number] DOB: 1928-03-28   Care Coordination Outreach Attempts:  SW placed an unsuccessful outbound call to the patients daughter in response to a voice message received. SW left a HIPAA compliant voice message requesting a return call.  Follow Up Plan:  SW will await a return call.  Encounter Outcome:  No Answer   Care Coordination Interventions:  No, not indicated    Bevelyn Ngo, BSW, CDP Social Worker, Certified Dementia Practitioner [facility] Care Management  Care Coordination [phone]

## 2022-09-19 ENCOUNTER — Telehealth: Payer: Self-pay | Admitting: Neurology

## 2022-09-19 NOTE — Telephone Encounter (Signed)
Called and stated she could send over orders to be reviewed and signed

## 2022-09-19 NOTE — Telephone Encounter (Signed)
PT Whitney Henderson w/ [facility] is asking for verbal orders for a new start of care date, she can be called at [phone] her vm is secure

## 2022-09-20 DIAGNOSIS — N183 Chronic kidney disease, stage 3 unspecified: Secondary | ICD-10-CM | POA: Diagnosis not present

## 2022-09-20 DIAGNOSIS — L930 Discoid lupus erythematosus: Secondary | ICD-10-CM | POA: Diagnosis not present

## 2022-09-20 DIAGNOSIS — G8929 Other chronic pain: Secondary | ICD-10-CM | POA: Diagnosis not present

## 2022-09-20 DIAGNOSIS — M810 Age-related osteoporosis without current pathological fracture: Secondary | ICD-10-CM | POA: Diagnosis not present

## 2022-09-20 DIAGNOSIS — Z87891 Personal history of nicotine dependence: Secondary | ICD-10-CM | POA: Diagnosis not present

## 2022-09-20 DIAGNOSIS — L84 Corns and callosities: Secondary | ICD-10-CM | POA: Diagnosis not present

## 2022-09-20 DIAGNOSIS — Z87440 Personal history of urinary (tract) infections: Secondary | ICD-10-CM | POA: Diagnosis not present

## 2022-09-20 DIAGNOSIS — H269 Unspecified cataract: Secondary | ICD-10-CM | POA: Diagnosis not present

## 2022-09-20 DIAGNOSIS — I129 Hypertensive chronic kidney disease with stage 1 through stage 4 chronic kidney disease, or unspecified chronic kidney disease: Secondary | ICD-10-CM | POA: Diagnosis not present

## 2022-09-20 DIAGNOSIS — G43909 Migraine, unspecified, not intractable, without status migrainosus: Secondary | ICD-10-CM | POA: Diagnosis not present

## 2022-09-20 DIAGNOSIS — G3184 Mild cognitive impairment, so stated: Secondary | ICD-10-CM | POA: Diagnosis not present

## 2022-09-20 DIAGNOSIS — M545 Low back pain, unspecified: Secondary | ICD-10-CM | POA: Diagnosis not present

## 2022-09-20 DIAGNOSIS — D631 Anemia in chronic kidney disease: Secondary | ICD-10-CM | POA: Diagnosis not present

## 2022-09-20 DIAGNOSIS — G20C Parkinsonism, unspecified: Secondary | ICD-10-CM | POA: Diagnosis not present

## 2022-09-24 ENCOUNTER — Telehealth: Payer: Self-pay | Admitting: Neurology

## 2022-09-24 NOTE — Telephone Encounter (Signed)
Called PT back and relayed to send orders

## 2022-09-24 NOTE — Telephone Encounter (Signed)
[facility] Berline Lopes) requesting verbal orders for PT. Frequency: 1x wk for 1 wk 2x wk for 4 wks 1x wk for 3 wks  Request for speech therapy evaluation.

## 2022-10-02 ENCOUNTER — Ambulatory Visit: Payer: Self-pay

## 2022-10-02 DIAGNOSIS — N183 Chronic kidney disease, stage 3 unspecified: Secondary | ICD-10-CM | POA: Diagnosis not present

## 2022-10-02 DIAGNOSIS — G8929 Other chronic pain: Secondary | ICD-10-CM | POA: Diagnosis not present

## 2022-10-02 DIAGNOSIS — G3184 Mild cognitive impairment, so stated: Secondary | ICD-10-CM | POA: Diagnosis not present

## 2022-10-02 DIAGNOSIS — M545 Low back pain, unspecified: Secondary | ICD-10-CM | POA: Diagnosis not present

## 2022-10-02 DIAGNOSIS — Z87891 Personal history of nicotine dependence: Secondary | ICD-10-CM | POA: Diagnosis not present

## 2022-10-02 DIAGNOSIS — G20C Parkinsonism, unspecified: Secondary | ICD-10-CM | POA: Diagnosis not present

## 2022-10-02 DIAGNOSIS — D631 Anemia in chronic kidney disease: Secondary | ICD-10-CM | POA: Diagnosis not present

## 2022-10-02 DIAGNOSIS — H269 Unspecified cataract: Secondary | ICD-10-CM | POA: Diagnosis not present

## 2022-10-02 DIAGNOSIS — L930 Discoid lupus erythematosus: Secondary | ICD-10-CM | POA: Diagnosis not present

## 2022-10-02 DIAGNOSIS — L84 Corns and callosities: Secondary | ICD-10-CM | POA: Diagnosis not present

## 2022-10-02 DIAGNOSIS — I129 Hypertensive chronic kidney disease with stage 1 through stage 4 chronic kidney disease, or unspecified chronic kidney disease: Secondary | ICD-10-CM | POA: Diagnosis not present

## 2022-10-02 DIAGNOSIS — M810 Age-related osteoporosis without current pathological fracture: Secondary | ICD-10-CM | POA: Diagnosis not present

## 2022-10-02 DIAGNOSIS — Z87440 Personal history of urinary (tract) infections: Secondary | ICD-10-CM | POA: Diagnosis not present

## 2022-10-02 DIAGNOSIS — G43909 Migraine, unspecified, not intractable, without status migrainosus: Secondary | ICD-10-CM | POA: Diagnosis not present

## 2022-10-03 NOTE — Patient Outreach (Signed)
  Care Coordination   Follow Up Visit Note   10/02/2022 Name: Whitney Henderson MRN: [id number] DOB: 1928-11-20  Whitney Henderson is a 89 y.o. year old female who sees Whitney Peng, MD for primary care. I spoke with Whitney Henderson and daughter Whitney Henderson by phone today.  What matters to the patients health and wellness today?  Patient would like to participate in home PT for strengthening and gait improvement. She would like to have her speech evaluated.     Goals Addressed             This Visit's Progress    COMPLETED: I am worried about mother having another UTI       Care Coordination Interventions: Assessed the Patient understanding of chronic kidney disease    Reviewed prescribed diet continue to increase daily water intake to 48-64 oz daily unless otherwise directed Advised patient to discuss persistent urinary incontinence  with provider    Report new symptoms or concerns to PCP promptly for early diagnosis/treatment      To start in home PT to work on strengthening and balance   On track    Care Coordination Interventions: Evaluation of current treatment plan related to abnormal gait and patient's adherence to plan as established by provider Reviewed and discussed with patient and daughter Whitney Henderson patient started PT today Determined an order has been placed for ST for evaluation of speech Educated patient and daughter regarding PD and speech/swallowing  Mailed printed educational materials related to Parkinson's disease  Reviewed medications with patient and discussed importance of medication adherence Assessed for falls since last encounter Assessed patients knowledge of fall risk prevention secondary to previously provided education    Interventions Today    Flowsheet Row Most Recent Value  Chronic Disease   Chronic disease during today's visit Other  [Parkinson's disease]  General Interventions   General Interventions Discussed/Reviewed General Interventions Reviewed,  General Interventions Discussed, Doctor Visits  Doctor Visits Discussed/Reviewed Doctor Visits Discussed, Doctor Visits Reviewed, PCP, Specialist  Exercise Interventions   Exercise Discussed/Reviewed Physical Activity, Exercise Reviewed, Exercise Discussed  Physical Activity Discussed/Reviewed Physical Activity Discussed, Physical Activity Reviewed, Home Exercise Program (HEP)  Education Interventions   Education Provided Provided Education, Provided Printed Education  Provided Verbal Education On When to see the doctor, Medication  Pharmacy Interventions   Pharmacy Dicussed/Reviewed Pharmacy Topics Reviewed, Pharmacy Topics Discussed, Medications and their functions  Safety Interventions   Safety Discussed/Reviewed Safety Discussed, Safety Reviewed, Fall Risk, Home Safety          SDOH assessments and interventions completed:  No     Care Coordination Interventions:  Yes, provided   Follow up plan: Follow up call scheduled for 10/30/22 @2 :00 PM    Encounter Outcome:  Patient Visit Completed

## 2022-10-03 NOTE — Patient Instructions (Signed)
Visit Information  Thank you for taking time to visit with me today. Please don't hesitate to contact me if I can be of assistance to you.   Following are the goals we discussed today:   Goals Addressed             This Visit's Progress    COMPLETED: I am worried about mother having another UTI       Care Coordination Interventions: Assessed the Patient understanding of chronic kidney disease    Reviewed prescribed diet continue to increase daily water intake to 48-64 oz daily unless otherwise directed Advised patient to discuss persistent urinary incontinence  with provider    Report new symptoms or concerns to PCP promptly for early diagnosis/treatment      To start in home PT to work on strengthening and balance   On track    Care Coordination Interventions: Evaluation of current treatment plan related to abnormal gait and patient's adherence to plan as established by provider Reviewed and discussed with patient and daughter [name] patient started PT today Determined an order has been placed for ST for evaluation of speech Educated patient and daughter regarding PD and speech/swallowing  Mailed printed educational materials related to Parkinson's disease  Reviewed medications with patient and discussed importance of medication adherence Assessed for falls since last encounter Assessed patients knowledge of fall risk prevention secondary to previously provided education        Our next appointment is by telephone on 10/30/22 at 2:00 PM  Please call the care guide team at [phone] if you need to cancel or reschedule your appointment.   If you are experiencing a Mental Health or Behavioral Health Crisis or need someone to talk to, please call 1-800-273-TALK (toll free, 24 hour hotline)  The patient verbalized understanding of instructions, educational materials, and care plan provided today and DECLINED offer to receive copy of patient instructions, educational materials,  and care plan.   Delsa Sale RN BSN CCM [place]  [facility], [facility] Nurse Care Coordinator  Direct Dial: [phone] Website: [email or web address]

## 2022-10-04 DIAGNOSIS — D631 Anemia in chronic kidney disease: Secondary | ICD-10-CM | POA: Diagnosis not present

## 2022-10-04 DIAGNOSIS — L84 Corns and callosities: Secondary | ICD-10-CM | POA: Diagnosis not present

## 2022-10-04 DIAGNOSIS — G8929 Other chronic pain: Secondary | ICD-10-CM | POA: Diagnosis not present

## 2022-10-04 DIAGNOSIS — M810 Age-related osteoporosis without current pathological fracture: Secondary | ICD-10-CM | POA: Diagnosis not present

## 2022-10-04 DIAGNOSIS — L930 Discoid lupus erythematosus: Secondary | ICD-10-CM | POA: Diagnosis not present

## 2022-10-04 DIAGNOSIS — G43909 Migraine, unspecified, not intractable, without status migrainosus: Secondary | ICD-10-CM | POA: Diagnosis not present

## 2022-10-04 DIAGNOSIS — G20C Parkinsonism, unspecified: Secondary | ICD-10-CM | POA: Diagnosis not present

## 2022-10-04 DIAGNOSIS — Z87440 Personal history of urinary (tract) infections: Secondary | ICD-10-CM | POA: Diagnosis not present

## 2022-10-04 DIAGNOSIS — N183 Chronic kidney disease, stage 3 unspecified: Secondary | ICD-10-CM | POA: Diagnosis not present

## 2022-10-04 DIAGNOSIS — G3184 Mild cognitive impairment, so stated: Secondary | ICD-10-CM | POA: Diagnosis not present

## 2022-10-04 DIAGNOSIS — I129 Hypertensive chronic kidney disease with stage 1 through stage 4 chronic kidney disease, or unspecified chronic kidney disease: Secondary | ICD-10-CM | POA: Diagnosis not present

## 2022-10-04 DIAGNOSIS — M545 Low back pain, unspecified: Secondary | ICD-10-CM | POA: Diagnosis not present

## 2022-10-04 DIAGNOSIS — Z87891 Personal history of nicotine dependence: Secondary | ICD-10-CM | POA: Diagnosis not present

## 2022-10-04 DIAGNOSIS — H269 Unspecified cataract: Secondary | ICD-10-CM | POA: Diagnosis not present

## 2022-10-06 ENCOUNTER — Other Ambulatory Visit: Payer: Self-pay | Admitting: Internal Medicine

## 2022-10-06 DIAGNOSIS — I129 Hypertensive chronic kidney disease with stage 1 through stage 4 chronic kidney disease, or unspecified chronic kidney disease: Secondary | ICD-10-CM

## 2022-10-09 DIAGNOSIS — D631 Anemia in chronic kidney disease: Secondary | ICD-10-CM | POA: Diagnosis not present

## 2022-10-09 DIAGNOSIS — Z87440 Personal history of urinary (tract) infections: Secondary | ICD-10-CM | POA: Diagnosis not present

## 2022-10-09 DIAGNOSIS — L84 Corns and callosities: Secondary | ICD-10-CM | POA: Diagnosis not present

## 2022-10-09 DIAGNOSIS — N183 Chronic kidney disease, stage 3 unspecified: Secondary | ICD-10-CM | POA: Diagnosis not present

## 2022-10-09 DIAGNOSIS — G43909 Migraine, unspecified, not intractable, without status migrainosus: Secondary | ICD-10-CM | POA: Diagnosis not present

## 2022-10-09 DIAGNOSIS — G20C Parkinsonism, unspecified: Secondary | ICD-10-CM | POA: Diagnosis not present

## 2022-10-09 DIAGNOSIS — M810 Age-related osteoporosis without current pathological fracture: Secondary | ICD-10-CM | POA: Diagnosis not present

## 2022-10-09 DIAGNOSIS — M545 Low back pain, unspecified: Secondary | ICD-10-CM | POA: Diagnosis not present

## 2022-10-09 DIAGNOSIS — G3184 Mild cognitive impairment, so stated: Secondary | ICD-10-CM | POA: Diagnosis not present

## 2022-10-09 DIAGNOSIS — L930 Discoid lupus erythematosus: Secondary | ICD-10-CM | POA: Diagnosis not present

## 2022-10-09 DIAGNOSIS — Z87891 Personal history of nicotine dependence: Secondary | ICD-10-CM | POA: Diagnosis not present

## 2022-10-09 DIAGNOSIS — G8929 Other chronic pain: Secondary | ICD-10-CM | POA: Diagnosis not present

## 2022-10-09 DIAGNOSIS — I129 Hypertensive chronic kidney disease with stage 1 through stage 4 chronic kidney disease, or unspecified chronic kidney disease: Secondary | ICD-10-CM | POA: Diagnosis not present

## 2022-10-09 DIAGNOSIS — H269 Unspecified cataract: Secondary | ICD-10-CM | POA: Diagnosis not present

## 2022-10-10 ENCOUNTER — Ambulatory Visit (INDEPENDENT_AMBULATORY_CARE_PROVIDER_SITE_OTHER): Payer: Medicare Other

## 2022-10-10 VITALS — BP 166/75 | HR 90 | Temp 97.4°F | Resp 16 | Ht 62.0 in | Wt 118.0 lb

## 2022-10-10 DIAGNOSIS — M8000XD Age-related osteoporosis with current pathological fracture, unspecified site, subsequent encounter for fracture with routine healing: Secondary | ICD-10-CM

## 2022-10-10 DIAGNOSIS — M8000XA Age-related osteoporosis with current pathological fracture, unspecified site, initial encounter for fracture: Secondary | ICD-10-CM

## 2022-10-10 DIAGNOSIS — S72002D Fracture of unspecified part of neck of left femur, subsequent encounter for closed fracture with routine healing: Secondary | ICD-10-CM | POA: Diagnosis not present

## 2022-10-10 MED ORDER — ROMOSOZUMAB-AQQG 105 MG/1.17ML ~~LOC~~ SOSY
210.0000 mg | PREFILLED_SYRINGE | Freq: Once | SUBCUTANEOUS | Status: AC
  Administered 2022-10-10: 210 mg via SUBCUTANEOUS
  Filled 2022-10-10: qty 2.34

## 2022-10-10 NOTE — Progress Notes (Signed)
Diagnosis: Osteoporosis  Provider:  Chilton Greathouse MD  Procedure: Injection  Evenity ([id number]), Dose: 210 mg, Site: subcutaneous, Number of injections: 2   Discharge: Condition: Good, Destination: Home . AVS Provided  Performed by:  Nat Math, RN

## 2022-10-14 DIAGNOSIS — G8929 Other chronic pain: Secondary | ICD-10-CM | POA: Diagnosis not present

## 2022-10-14 DIAGNOSIS — G3184 Mild cognitive impairment, so stated: Secondary | ICD-10-CM | POA: Diagnosis not present

## 2022-10-14 DIAGNOSIS — M545 Low back pain, unspecified: Secondary | ICD-10-CM | POA: Diagnosis not present

## 2022-10-14 DIAGNOSIS — H269 Unspecified cataract: Secondary | ICD-10-CM | POA: Diagnosis not present

## 2022-10-14 DIAGNOSIS — I129 Hypertensive chronic kidney disease with stage 1 through stage 4 chronic kidney disease, or unspecified chronic kidney disease: Secondary | ICD-10-CM | POA: Diagnosis not present

## 2022-10-14 DIAGNOSIS — Z87891 Personal history of nicotine dependence: Secondary | ICD-10-CM | POA: Diagnosis not present

## 2022-10-14 DIAGNOSIS — G20C Parkinsonism, unspecified: Secondary | ICD-10-CM | POA: Diagnosis not present

## 2022-10-14 DIAGNOSIS — G43909 Migraine, unspecified, not intractable, without status migrainosus: Secondary | ICD-10-CM | POA: Diagnosis not present

## 2022-10-14 DIAGNOSIS — N183 Chronic kidney disease, stage 3 unspecified: Secondary | ICD-10-CM | POA: Diagnosis not present

## 2022-10-14 DIAGNOSIS — M810 Age-related osteoporosis without current pathological fracture: Secondary | ICD-10-CM | POA: Diagnosis not present

## 2022-10-14 DIAGNOSIS — Z87440 Personal history of urinary (tract) infections: Secondary | ICD-10-CM | POA: Diagnosis not present

## 2022-10-14 DIAGNOSIS — D631 Anemia in chronic kidney disease: Secondary | ICD-10-CM | POA: Diagnosis not present

## 2022-10-14 DIAGNOSIS — L930 Discoid lupus erythematosus: Secondary | ICD-10-CM | POA: Diagnosis not present

## 2022-10-14 DIAGNOSIS — L84 Corns and callosities: Secondary | ICD-10-CM | POA: Diagnosis not present

## 2022-10-16 ENCOUNTER — Telehealth: Payer: Self-pay | Admitting: Neurology

## 2022-10-16 DIAGNOSIS — G8929 Other chronic pain: Secondary | ICD-10-CM | POA: Diagnosis not present

## 2022-10-16 DIAGNOSIS — M810 Age-related osteoporosis without current pathological fracture: Secondary | ICD-10-CM | POA: Diagnosis not present

## 2022-10-16 DIAGNOSIS — G20C Parkinsonism, unspecified: Secondary | ICD-10-CM | POA: Diagnosis not present

## 2022-10-16 DIAGNOSIS — M545 Low back pain, unspecified: Secondary | ICD-10-CM | POA: Diagnosis not present

## 2022-10-16 DIAGNOSIS — L84 Corns and callosities: Secondary | ICD-10-CM | POA: Diagnosis not present

## 2022-10-16 DIAGNOSIS — Z87891 Personal history of nicotine dependence: Secondary | ICD-10-CM | POA: Diagnosis not present

## 2022-10-16 DIAGNOSIS — D631 Anemia in chronic kidney disease: Secondary | ICD-10-CM | POA: Diagnosis not present

## 2022-10-16 DIAGNOSIS — G3184 Mild cognitive impairment, so stated: Secondary | ICD-10-CM | POA: Diagnosis not present

## 2022-10-16 DIAGNOSIS — Z87440 Personal history of urinary (tract) infections: Secondary | ICD-10-CM | POA: Diagnosis not present

## 2022-10-16 DIAGNOSIS — H269 Unspecified cataract: Secondary | ICD-10-CM | POA: Diagnosis not present

## 2022-10-16 DIAGNOSIS — I129 Hypertensive chronic kidney disease with stage 1 through stage 4 chronic kidney disease, or unspecified chronic kidney disease: Secondary | ICD-10-CM | POA: Diagnosis not present

## 2022-10-16 DIAGNOSIS — L930 Discoid lupus erythematosus: Secondary | ICD-10-CM | POA: Diagnosis not present

## 2022-10-16 DIAGNOSIS — N183 Chronic kidney disease, stage 3 unspecified: Secondary | ICD-10-CM | POA: Diagnosis not present

## 2022-10-16 DIAGNOSIS — G43909 Migraine, unspecified, not intractable, without status migrainosus: Secondary | ICD-10-CM | POA: Diagnosis not present

## 2022-10-16 NOTE — Telephone Encounter (Signed)
Whitney Henderson from [facility] called needing VO for the pt for Speech 1 time for 5 weeks to address Cognition. Please advise.

## 2022-10-16 NOTE — Telephone Encounter (Signed)
Ok to add on Speech therapy

## 2022-10-17 NOTE — Telephone Encounter (Signed)
Left voicemail for alden with verbal order for speech therapy 1x /week for 5 weeks. Left message to return call to office if needed.

## 2022-10-22 ENCOUNTER — Telehealth: Payer: Self-pay

## 2022-10-22 DIAGNOSIS — M810 Age-related osteoporosis without current pathological fracture: Secondary | ICD-10-CM | POA: Diagnosis not present

## 2022-10-22 DIAGNOSIS — G43909 Migraine, unspecified, not intractable, without status migrainosus: Secondary | ICD-10-CM | POA: Diagnosis not present

## 2022-10-22 DIAGNOSIS — N183 Chronic kidney disease, stage 3 unspecified: Secondary | ICD-10-CM | POA: Diagnosis not present

## 2022-10-22 DIAGNOSIS — I129 Hypertensive chronic kidney disease with stage 1 through stage 4 chronic kidney disease, or unspecified chronic kidney disease: Secondary | ICD-10-CM | POA: Diagnosis not present

## 2022-10-22 DIAGNOSIS — D631 Anemia in chronic kidney disease: Secondary | ICD-10-CM | POA: Diagnosis not present

## 2022-10-22 DIAGNOSIS — Z87891 Personal history of nicotine dependence: Secondary | ICD-10-CM | POA: Diagnosis not present

## 2022-10-22 DIAGNOSIS — M545 Low back pain, unspecified: Secondary | ICD-10-CM | POA: Diagnosis not present

## 2022-10-22 DIAGNOSIS — G8929 Other chronic pain: Secondary | ICD-10-CM | POA: Diagnosis not present

## 2022-10-22 DIAGNOSIS — L84 Corns and callosities: Secondary | ICD-10-CM | POA: Diagnosis not present

## 2022-10-22 DIAGNOSIS — L930 Discoid lupus erythematosus: Secondary | ICD-10-CM | POA: Diagnosis not present

## 2022-10-22 DIAGNOSIS — Z87440 Personal history of urinary (tract) infections: Secondary | ICD-10-CM | POA: Diagnosis not present

## 2022-10-22 DIAGNOSIS — H269 Unspecified cataract: Secondary | ICD-10-CM | POA: Diagnosis not present

## 2022-10-22 DIAGNOSIS — G3184 Mild cognitive impairment, so stated: Secondary | ICD-10-CM | POA: Diagnosis not present

## 2022-10-22 DIAGNOSIS — G20C Parkinsonism, unspecified: Secondary | ICD-10-CM | POA: Diagnosis not present

## 2022-10-22 NOTE — Telephone Encounter (Signed)
Faxed orders to [facility]

## 2022-10-23 DIAGNOSIS — G43909 Migraine, unspecified, not intractable, without status migrainosus: Secondary | ICD-10-CM | POA: Diagnosis not present

## 2022-10-23 DIAGNOSIS — G8929 Other chronic pain: Secondary | ICD-10-CM | POA: Diagnosis not present

## 2022-10-23 DIAGNOSIS — G20C Parkinsonism, unspecified: Secondary | ICD-10-CM | POA: Diagnosis not present

## 2022-10-23 DIAGNOSIS — L930 Discoid lupus erythematosus: Secondary | ICD-10-CM | POA: Diagnosis not present

## 2022-10-23 DIAGNOSIS — Z87440 Personal history of urinary (tract) infections: Secondary | ICD-10-CM | POA: Diagnosis not present

## 2022-10-23 DIAGNOSIS — N183 Chronic kidney disease, stage 3 unspecified: Secondary | ICD-10-CM | POA: Diagnosis not present

## 2022-10-23 DIAGNOSIS — G3184 Mild cognitive impairment, so stated: Secondary | ICD-10-CM | POA: Diagnosis not present

## 2022-10-23 DIAGNOSIS — D631 Anemia in chronic kidney disease: Secondary | ICD-10-CM | POA: Diagnosis not present

## 2022-10-23 DIAGNOSIS — L84 Corns and callosities: Secondary | ICD-10-CM | POA: Diagnosis not present

## 2022-10-23 DIAGNOSIS — M810 Age-related osteoporosis without current pathological fracture: Secondary | ICD-10-CM | POA: Diagnosis not present

## 2022-10-23 DIAGNOSIS — H269 Unspecified cataract: Secondary | ICD-10-CM | POA: Diagnosis not present

## 2022-10-23 DIAGNOSIS — I129 Hypertensive chronic kidney disease with stage 1 through stage 4 chronic kidney disease, or unspecified chronic kidney disease: Secondary | ICD-10-CM | POA: Diagnosis not present

## 2022-10-23 DIAGNOSIS — M545 Low back pain, unspecified: Secondary | ICD-10-CM | POA: Diagnosis not present

## 2022-10-23 DIAGNOSIS — Z87891 Personal history of nicotine dependence: Secondary | ICD-10-CM | POA: Diagnosis not present

## 2022-10-30 ENCOUNTER — Ambulatory Visit: Payer: Self-pay

## 2022-10-30 DIAGNOSIS — G8929 Other chronic pain: Secondary | ICD-10-CM | POA: Diagnosis not present

## 2022-10-30 DIAGNOSIS — M810 Age-related osteoporosis without current pathological fracture: Secondary | ICD-10-CM | POA: Diagnosis not present

## 2022-10-30 DIAGNOSIS — Z87891 Personal history of nicotine dependence: Secondary | ICD-10-CM | POA: Diagnosis not present

## 2022-10-30 DIAGNOSIS — D631 Anemia in chronic kidney disease: Secondary | ICD-10-CM | POA: Diagnosis not present

## 2022-10-30 DIAGNOSIS — G3184 Mild cognitive impairment, so stated: Secondary | ICD-10-CM | POA: Diagnosis not present

## 2022-10-30 DIAGNOSIS — H269 Unspecified cataract: Secondary | ICD-10-CM | POA: Diagnosis not present

## 2022-10-30 DIAGNOSIS — M545 Low back pain, unspecified: Secondary | ICD-10-CM | POA: Diagnosis not present

## 2022-10-30 DIAGNOSIS — Z87440 Personal history of urinary (tract) infections: Secondary | ICD-10-CM | POA: Diagnosis not present

## 2022-10-30 DIAGNOSIS — N183 Chronic kidney disease, stage 3 unspecified: Secondary | ICD-10-CM | POA: Diagnosis not present

## 2022-10-30 DIAGNOSIS — G43909 Migraine, unspecified, not intractable, without status migrainosus: Secondary | ICD-10-CM | POA: Diagnosis not present

## 2022-10-30 DIAGNOSIS — G20C Parkinsonism, unspecified: Secondary | ICD-10-CM | POA: Diagnosis not present

## 2022-10-30 DIAGNOSIS — I129 Hypertensive chronic kidney disease with stage 1 through stage 4 chronic kidney disease, or unspecified chronic kidney disease: Secondary | ICD-10-CM | POA: Diagnosis not present

## 2022-10-30 DIAGNOSIS — L930 Discoid lupus erythematosus: Secondary | ICD-10-CM | POA: Diagnosis not present

## 2022-10-30 DIAGNOSIS — L84 Corns and callosities: Secondary | ICD-10-CM | POA: Diagnosis not present

## 2022-10-31 ENCOUNTER — Other Ambulatory Visit: Payer: Self-pay | Admitting: Internal Medicine

## 2022-10-31 ENCOUNTER — Other Ambulatory Visit (INDEPENDENT_AMBULATORY_CARE_PROVIDER_SITE_OTHER): Payer: Medicare Other

## 2022-10-31 DIAGNOSIS — R350 Frequency of micturition: Secondary | ICD-10-CM | POA: Diagnosis not present

## 2022-10-31 DIAGNOSIS — H40013 Open angle with borderline findings, low risk, bilateral: Secondary | ICD-10-CM | POA: Diagnosis not present

## 2022-10-31 DIAGNOSIS — R8290 Unspecified abnormal findings in urine: Secondary | ICD-10-CM | POA: Diagnosis not present

## 2022-10-31 DIAGNOSIS — H35033 Hypertensive retinopathy, bilateral: Secondary | ICD-10-CM | POA: Diagnosis not present

## 2022-10-31 DIAGNOSIS — H353131 Nonexudative age-related macular degeneration, bilateral, early dry stage: Secondary | ICD-10-CM | POA: Diagnosis not present

## 2022-10-31 DIAGNOSIS — N390 Urinary tract infection, site not specified: Secondary | ICD-10-CM

## 2022-10-31 LAB — POCT URINALYSIS DIP (CLINITEK)
Bilirubin, UA: NEGATIVE
Blood, UA: NEGATIVE
Glucose, UA: NEGATIVE mg/dL
Ketones, POC UA: NEGATIVE mg/dL
Nitrite, UA: POSITIVE — AB
POC PROTEIN,UA: NEGATIVE
Spec Grav, UA: 1.02 (ref 1.010–1.025)
Urobilinogen, UA: 0.2 U/dL
pH, UA: 6 (ref 5.0–8.0)

## 2022-10-31 MED ORDER — CEPHALEXIN 500 MG PO CAPS: 500.0000 mg | ORAL_CAPSULE | Freq: Two times a day (BID) | ORAL | 0 refills | Status: AC

## 2022-10-31 NOTE — Patient Instructions (Signed)
Visit Information  Thank you for taking time to visit with me today. Please don't hesitate to contact me if I can be of assistance to you.   Following are the goals we discussed today:   Goals Addressed             This Visit's Progress    To have urine checked for UTI       Care Coordination Interventions: Evaluation of current treatment plan related to urinary frequency  and patient's adherence to plan as established by provider Discussed with patient and daughter [name], patient is experiencing urinary frequency x1 week and daughter reports patient experienced some hallucinations this am as with past UTI Collaborated with Dr. [name], PCP to coordinate a lab visit for tomorrow, 10/31/22 in order to check for UTI Advised patient and daughter a lab visit for tomorrow at 12 PM has been scheduled per Dr. [name] in order to drop off a urine sample and patient/daughter verbalize understanding      To start in home PT to work on strengthening and balance   On track    Care Coordination Interventions: Evaluation of current treatment plan related to abnormal gait and patient's adherence to plan as established by provider Determined patient has started in home PT and ST as directed by Dr. [name] and is finding these services to be effective thus far Confirmed patient and daughter received and reviewed the patient ed materials related to Parkinson's disease, patient denies having questions at this time Reviewed medications with patient and discussed importance of medication adherence, determined patient is experiencing a rash on her backside since starting to wear the Exelon patch, the rash is located near the patch in multiple area's where the patch was rotated daily as directed Collaborated with [facility] pharmacist [name] RPH-CPP regarding patient's reported symptoms of rash since wearing Exelon, reviewed and discussed with patient/daughter [name] the pharmacological SE noted per  manufacturer's PI, noted rash may occur and this should be reported to the prescribing doctor as well removing the patch until further directed Instructed patient and daughter as the stated above recommendations per package insert Determined patient is prescribed to use Triamcinolone in the past for other rash, she will apply this to the current rash and report this SE to the prescribing Neurologist ASAP, updated medication profile accordingly         Our next appointment is by telephone on 11/27/22 at 2:00 PM   Please call the care guide team at [phone] if you need to cancel or reschedule your appointment.   If you are experiencing a Mental Health or Behavioral Health Crisis or need someone to talk to, please call 1-800-273-TALK (toll free, 24 hour hotline)  The patient verbalized understanding of instructions, educational materials, and care plan provided today and DECLINED offer to receive copy of patient instructions, educational materials, and care plan.   Delsa Sale RN BSN CCM   [facility], [facility] Nurse Care Coordinator  Direct Dial: [phone] Website: [email or web address]

## 2022-10-31 NOTE — Patient Outreach (Signed)
Care Coordination   Follow Up Visit Note   10/30/2022 Name: Whitney Henderson MRN: [id number] DOB: [birth year]  Whitney Henderson is a 89 y.o. year old female who sees [name], MD for primary care. I spoke with [name] and daughter Whitney Henderson by phone today.  What matters to the patients health and wellness today?  Patient would like to have her urine checked for UTI. Patient is concerned about a rash she developed following start of Exelon patch.     Goals Addressed             This Visit's Progress    To have urine checked for UTI       Care Coordination Interventions: Evaluation of current treatment plan related to urinary frequency  and patient's adherence to plan as established by provider Discussed with patient and daughter Whitney Henderson, patient is experiencing urinary frequency x1 week and daughter reports patient experienced some hallucinations this am as with past UTI Collaborated with Dr. [name], PCP to coordinate a lab visit for tomorrow, 10/31/22 in order to check for UTI Advised patient and daughter a lab visit for tomorrow at 12 PM has been scheduled per Dr. [name] in order to drop off a urine sample and patient/daughter verbalize understanding      To start in home PT to work on strengthening and balance   On track    Care Coordination Interventions: Evaluation of current treatment plan related to abnormal gait and patient's adherence to plan as established by provider Determined patient has started in home PT and ST as directed by Dr. [name] and is finding these services to be effective thus far Confirmed patient and daughter received and reviewed the patient ed materials related to Parkinson's disease, patient denies having questions at this time Reviewed medications with patient and discussed importance of medication adherence, determined patient is experiencing a rash on her backside since starting to wear the Exelon patch, the rash is located near the patch in  multiple area's where the patch was rotated daily as directed Collaborated with [name] pharmacist [name] RPH-CPP regarding patient's reported symptoms of rash since wearing Exelon, reviewed and discussed with patient/daughter Whitney Henderson the pharmacological SE noted per manufacturer's PI, noted rash may occur and this should be reported to the prescribing doctor as well removing the patch until further directed Instructed patient and daughter as the stated above recommendations per package insert Determined patient is prescribed to use Triamcinolone in the past for other rash, she will apply this to the current rash and report this SE to the prescribing Neurologist ASAP, updated medication profile accordingly     Interventions Today    Flowsheet Row Most Recent Value  Chronic Disease   Chronic disease during today's visit Other  [Parkinson's disease,  urinary frequency]  General Interventions   General Interventions Discussed/Reviewed General Interventions Discussed, General Interventions Reviewed, Doctor Visits, Communication with  Doctor Visits Discussed/Reviewed Doctor Visits Discussed, Doctor Visits Reviewed, Specialist  Communication with Pharmacists, PCP/Specialists  [name] RPH-CPP,  [name]]  Education Interventions   Education Provided Provided Education  Provided Verbal Education On Nutrition, When to see the doctor, Medication  Nutrition Interventions   Nutrition Discussed/Reviewed Nutrition Discussed, Nutrition Reviewed, Fluid intake  Pharmacy Interventions   Pharmacy Dicussed/Reviewed Pharmacy Topics Reviewed, Pharmacy Topics Discussed, Medications and their functions          SDOH assessments and interventions completed:  No     Care Coordination Interventions:  Yes, provided  Follow up plan: Follow up call scheduled for 11/27/22 @2 :00 PM    Encounter Outcome:  Patient Visit Completed

## 2022-11-04 DIAGNOSIS — G8929 Other chronic pain: Secondary | ICD-10-CM | POA: Diagnosis not present

## 2022-11-04 DIAGNOSIS — D631 Anemia in chronic kidney disease: Secondary | ICD-10-CM | POA: Diagnosis not present

## 2022-11-04 DIAGNOSIS — G3184 Mild cognitive impairment, so stated: Secondary | ICD-10-CM | POA: Diagnosis not present

## 2022-11-04 DIAGNOSIS — N183 Chronic kidney disease, stage 3 unspecified: Secondary | ICD-10-CM | POA: Diagnosis not present

## 2022-11-04 DIAGNOSIS — I129 Hypertensive chronic kidney disease with stage 1 through stage 4 chronic kidney disease, or unspecified chronic kidney disease: Secondary | ICD-10-CM | POA: Diagnosis not present

## 2022-11-04 DIAGNOSIS — G20C Parkinsonism, unspecified: Secondary | ICD-10-CM | POA: Diagnosis not present

## 2022-11-04 DIAGNOSIS — Z87440 Personal history of urinary (tract) infections: Secondary | ICD-10-CM | POA: Diagnosis not present

## 2022-11-04 DIAGNOSIS — L84 Corns and callosities: Secondary | ICD-10-CM | POA: Diagnosis not present

## 2022-11-04 DIAGNOSIS — G43909 Migraine, unspecified, not intractable, without status migrainosus: Secondary | ICD-10-CM | POA: Diagnosis not present

## 2022-11-04 DIAGNOSIS — L930 Discoid lupus erythematosus: Secondary | ICD-10-CM | POA: Diagnosis not present

## 2022-11-04 DIAGNOSIS — Z87891 Personal history of nicotine dependence: Secondary | ICD-10-CM | POA: Diagnosis not present

## 2022-11-04 DIAGNOSIS — M545 Low back pain, unspecified: Secondary | ICD-10-CM | POA: Diagnosis not present

## 2022-11-04 DIAGNOSIS — H269 Unspecified cataract: Secondary | ICD-10-CM | POA: Diagnosis not present

## 2022-11-04 DIAGNOSIS — M810 Age-related osteoporosis without current pathological fracture: Secondary | ICD-10-CM | POA: Diagnosis not present

## 2022-11-04 LAB — URINE CULTURE

## 2022-11-06 DIAGNOSIS — Z87891 Personal history of nicotine dependence: Secondary | ICD-10-CM | POA: Diagnosis not present

## 2022-11-06 DIAGNOSIS — M810 Age-related osteoporosis without current pathological fracture: Secondary | ICD-10-CM | POA: Diagnosis not present

## 2022-11-06 DIAGNOSIS — H269 Unspecified cataract: Secondary | ICD-10-CM | POA: Diagnosis not present

## 2022-11-06 DIAGNOSIS — M545 Low back pain, unspecified: Secondary | ICD-10-CM | POA: Diagnosis not present

## 2022-11-06 DIAGNOSIS — G3184 Mild cognitive impairment, so stated: Secondary | ICD-10-CM | POA: Diagnosis not present

## 2022-11-06 DIAGNOSIS — N183 Chronic kidney disease, stage 3 unspecified: Secondary | ICD-10-CM | POA: Diagnosis not present

## 2022-11-06 DIAGNOSIS — Z87440 Personal history of urinary (tract) infections: Secondary | ICD-10-CM | POA: Diagnosis not present

## 2022-11-06 DIAGNOSIS — D631 Anemia in chronic kidney disease: Secondary | ICD-10-CM | POA: Diagnosis not present

## 2022-11-06 DIAGNOSIS — G20C Parkinsonism, unspecified: Secondary | ICD-10-CM | POA: Diagnosis not present

## 2022-11-06 DIAGNOSIS — L84 Corns and callosities: Secondary | ICD-10-CM | POA: Diagnosis not present

## 2022-11-06 DIAGNOSIS — G43909 Migraine, unspecified, not intractable, without status migrainosus: Secondary | ICD-10-CM | POA: Diagnosis not present

## 2022-11-06 DIAGNOSIS — G8929 Other chronic pain: Secondary | ICD-10-CM | POA: Diagnosis not present

## 2022-11-06 DIAGNOSIS — I129 Hypertensive chronic kidney disease with stage 1 through stage 4 chronic kidney disease, or unspecified chronic kidney disease: Secondary | ICD-10-CM | POA: Diagnosis not present

## 2022-11-06 DIAGNOSIS — L930 Discoid lupus erythematosus: Secondary | ICD-10-CM | POA: Diagnosis not present

## 2022-11-07 ENCOUNTER — Telehealth: Payer: Self-pay | Admitting: Neurology

## 2022-11-07 ENCOUNTER — Ambulatory Visit: Payer: Medicare Other | Admitting: *Deleted

## 2022-11-07 VITALS — BP 169/80 | HR 60 | Temp 97.6°F | Resp 18 | Ht 63.0 in | Wt 116.4 lb

## 2022-11-07 DIAGNOSIS — M8000XA Age-related osteoporosis with current pathological fracture, unspecified site, initial encounter for fracture: Secondary | ICD-10-CM

## 2022-11-07 DIAGNOSIS — S72002D Fracture of unspecified part of neck of left femur, subsequent encounter for closed fracture with routine healing: Secondary | ICD-10-CM

## 2022-11-07 MED ORDER — ROMOSOZUMAB-AQQG 105 MG/1.17ML ~~LOC~~ SOSY
210.0000 mg | PREFILLED_SYRINGE | Freq: Once | SUBCUTANEOUS | Status: AC
  Administered 2022-11-07: 210 mg via SUBCUTANEOUS
  Filled 2022-11-07: qty 2.34

## 2022-11-07 NOTE — Progress Notes (Signed)
Diagnosis: Osteoporosis  Provider:  Chilton Greathouse MD  Procedure: Injection  Evenity (Romosozumab-aqqg), Dose: 210 mg, Site: subcutaneous, Number of injections: 2  Post Care: Observation period completed  Discharge: Condition: Good, Destination: Home . AVS Provided  Performed by:  Forrest Moron, RN

## 2022-11-07 NOTE — Telephone Encounter (Signed)
Pt's daughter called stating that the pt has complained of itchy skin due to the rivastigmine (EXELON) 4.6 mg/24hr and she would like to discuss poss med change.

## 2022-11-07 NOTE — Telephone Encounter (Signed)
Called and spoke to patient and daughter, who reports she is doing okay and no other symptoms other than the rash from the patch, she stopped earlier this week after her home health nurse advised her to stop. She is still doing well no other complaints just wants to know if we can prescribe anything else in place of it. Please advise on meds.

## 2022-11-11 NOTE — Telephone Encounter (Signed)
It is okay for her to stop the patch if she could not tolerated.

## 2022-11-11 NOTE — Telephone Encounter (Signed)
Called and LVM for CB with [name]

## 2022-11-12 NOTE — Telephone Encounter (Signed)
2nd attempt-lvm °

## 2022-11-12 NOTE — Telephone Encounter (Signed)
Pt daughter velma called in and I relayed msg ok to dc patch she voiced gratitude and understanding

## 2022-11-12 NOTE — Addendum Note (Signed)
Addended by: Danne Harbor on: 11/12/2022 09:55 AM   Modules accepted: Orders

## 2022-11-14 DIAGNOSIS — L84 Corns and callosities: Secondary | ICD-10-CM | POA: Diagnosis not present

## 2022-11-14 DIAGNOSIS — N183 Chronic kidney disease, stage 3 unspecified: Secondary | ICD-10-CM | POA: Diagnosis not present

## 2022-11-14 DIAGNOSIS — Z87891 Personal history of nicotine dependence: Secondary | ICD-10-CM | POA: Diagnosis not present

## 2022-11-14 DIAGNOSIS — M545 Low back pain, unspecified: Secondary | ICD-10-CM | POA: Diagnosis not present

## 2022-11-14 DIAGNOSIS — I129 Hypertensive chronic kidney disease with stage 1 through stage 4 chronic kidney disease, or unspecified chronic kidney disease: Secondary | ICD-10-CM | POA: Diagnosis not present

## 2022-11-14 DIAGNOSIS — M810 Age-related osteoporosis without current pathological fracture: Secondary | ICD-10-CM | POA: Diagnosis not present

## 2022-11-14 DIAGNOSIS — H269 Unspecified cataract: Secondary | ICD-10-CM | POA: Diagnosis not present

## 2022-11-14 DIAGNOSIS — G8929 Other chronic pain: Secondary | ICD-10-CM | POA: Diagnosis not present

## 2022-11-14 DIAGNOSIS — G43909 Migraine, unspecified, not intractable, without status migrainosus: Secondary | ICD-10-CM | POA: Diagnosis not present

## 2022-11-14 DIAGNOSIS — D631 Anemia in chronic kidney disease: Secondary | ICD-10-CM | POA: Diagnosis not present

## 2022-11-14 DIAGNOSIS — Z87440 Personal history of urinary (tract) infections: Secondary | ICD-10-CM | POA: Diagnosis not present

## 2022-11-14 DIAGNOSIS — L930 Discoid lupus erythematosus: Secondary | ICD-10-CM | POA: Diagnosis not present

## 2022-11-14 DIAGNOSIS — G3184 Mild cognitive impairment, so stated: Secondary | ICD-10-CM | POA: Diagnosis not present

## 2022-11-14 DIAGNOSIS — G20C Parkinsonism, unspecified: Secondary | ICD-10-CM | POA: Diagnosis not present

## 2022-11-27 ENCOUNTER — Ambulatory Visit: Payer: Self-pay

## 2022-11-27 NOTE — Patient Instructions (Signed)
Visit Information  Thank you for taking time to visit with me today. Please don't hesitate to contact me if I can be of assistance to you.   Following are the goals we discussed today:   Goals Addressed             This Visit's Progress    COMPLETED: To have urine checked for UTI       Care Coordination Interventions: Evaluation of current treatment plan related to urinary frequency  and patient's adherence to plan as established by provider Discussed and reviewed with patient her urine culture tested positive for Escherichia coli  Determined patient completed her full course of antibiotics, she reports her symptoms have subsided Re-educated patient about signs and symptoms suggestive of UTI and when to call the doctor if symptoms occur Educated on importance of early diagnosis and early treatment can help prevent severe illness and or hospitalization  Reiterated to patient to aim for drinking 48-64 oz of water daily unless otherwise directed      To start in home PT to work on strengthening and balance   On track    Care Coordination Interventions: Evaluation of current treatment plan related to abnormal gait and patient's adherence to plan as established by provider Discussed with daughter she contacted Dr. [name] to report rash with Exelon, patient was instructed to discontinue use of this patch with no replacement drug ordered  Assessed for effectiveness of Carbidopa/Levodopa, patient reports effectiveness as evidence by balance has improved, no new symptoms related to PD noted, no SE noted Assessed for DME needs, patient is using walker and cane to help with ambulation  Provided written and verbal education re: potential causes of falls and Fall prevention strategies Advised patient of importance of notifying provider of falls Assessed for falls since last encounter Assessed patients knowledge of fall risk prevention secondary to previously provided education           Our  next appointment is by telephone on 02/13/23 at 2:30 PM  Please call the care guide team at [phone] if you need to cancel or reschedule your appointment.   If you are experiencing a Mental Health or Behavioral Health Crisis or need someone to talk to, please call 1-800-273-TALK (toll free, 24 hour hotline)  The patient verbalized understanding of instructions, educational materials, and care plan provided today and DECLINED offer to receive copy of patient instructions, educational materials, and care plan.   Delsa Sale RN BSN CCM [place]  [facility], [facility] Nurse Care Coordinator  Direct Dial: [phone] Website: [email or web address]

## 2022-11-27 NOTE — Patient Outreach (Signed)
  Care Coordination   Follow Up Visit Note   11/27/2022 Name: Whitney Henderson MRN: [id number] DOB: 1928-06-12  Whitney Henderson is a 89 y.o. year old female who sees Whitney Peng, MD for primary care. I spoke with  Whitney Henderson and daughter Whitney Henderson by phone today.  What matters to the patients health and wellness today?  Patient would like to avoid getting reoccurring UTI. She would like to avoid from falling.     Goals Addressed             This Visit's Progress    COMPLETED: To have urine checked for UTI       Care Coordination Interventions: Evaluation of current treatment plan related to urinary frequency  and patient's adherence to plan as established by provider Discussed and reviewed with patient her urine culture tested positive for Escherichia coli  Determined patient completed her full course of antibiotics, she reports her symptoms have subsided Re-educated patient about signs and symptoms suggestive of UTI and when to call the doctor if symptoms occur Educated on importance of early diagnosis and early treatment can help prevent severe illness and or hospitalization  Reiterated to patient to aim for drinking 48-64 oz of water daily unless otherwise directed      To start in home PT to work on strengthening and balance   On track    Care Coordination Interventions: Evaluation of current treatment plan related to abnormal gait and patient's adherence to plan as established by provider Discussed with daughter she contacted Dr. Terrace Henderson to report rash with Exelon, patient was instructed to discontinue use of this patch with no replacement drug ordered  Assessed for effectiveness of Carbidopa/Levodopa, patient reports effectiveness as evidence by balance has improved, no new symptoms related to PD noted, no SE noted Assessed for DME needs, patient is using walker and cane to help with ambulation  Provided written and verbal education re: potential causes of falls and Fall  prevention strategies Advised patient of importance of notifying provider of falls Assessed for falls since last encounter Assessed patients knowledge of fall risk prevention secondary to previously provided education    Interventions Today    Flowsheet Row Most Recent Value  Chronic Disease   Chronic disease during today's visit Other  [s/p UTI,  PD]  General Interventions   General Interventions Discussed/Reviewed General Interventions Discussed, General Interventions Reviewed, Doctor Visits, Durable Medical Equipment (DME)  Doctor Visits Discussed/Reviewed Doctor Visits Reviewed, Doctor Visits Discussed, Specialist, PCP  Durable Medical Equipment (DME) Dan Humphreys, Other  [cane]  Exercise Interventions   Exercise Discussed/Reviewed Physical Activity  Physical Activity Discussed/Reviewed Physical Activity Discussed, Physical Activity Reviewed  Education Interventions   Education Provided Provided Education  Provided Verbal Education On When to see the doctor, Nutrition, Medication  Nutrition Interventions   Nutrition Discussed/Reviewed Nutrition Discussed, Nutrition Reviewed, Fluid intake  Pharmacy Interventions   Pharmacy Dicussed/Reviewed Pharmacy Topics Discussed, Pharmacy Topics Reviewed, Medications and their functions  Safety Interventions   Safety Discussed/Reviewed Home Safety, Fall Risk, Safety Reviewed, Safety Discussed  Home Safety Assistive Devices          SDOH assessments and interventions completed:  No     Care Coordination Interventions:  Yes, provided   Follow up plan: Follow up call scheduled for 02/13/23 @2 :30 PM    Encounter Outcome:  Patient Visit Completed

## 2022-11-29 ENCOUNTER — Other Ambulatory Visit: Payer: Self-pay | Admitting: Internal Medicine

## 2022-11-30 ENCOUNTER — Other Ambulatory Visit: Payer: Self-pay | Admitting: Internal Medicine

## 2022-12-05 MED ORDER — ROMOSOZUMAB-AQQG 105 MG/1.17ML ~~LOC~~ SOSY: 210.0000 mg | PREFILLED_SYRINGE | Freq: Once | SUBCUTANEOUS | Status: DC

## 2022-12-10 ENCOUNTER — Ambulatory Visit: Payer: Medicare Other

## 2022-12-10 VITALS — BP 178/84 | HR 60 | Temp 97.5°F | Resp 20 | Ht 62.0 in | Wt 117.6 lb

## 2022-12-10 DIAGNOSIS — S72002D Fracture of unspecified part of neck of left femur, subsequent encounter for closed fracture with routine healing: Secondary | ICD-10-CM

## 2022-12-10 DIAGNOSIS — M8000XA Age-related osteoporosis with current pathological fracture, unspecified site, initial encounter for fracture: Secondary | ICD-10-CM

## 2022-12-10 MED ORDER — ROMOSOZUMAB-AQQG 105 MG/1.17ML ~~LOC~~ SOSY
210.0000 mg | PREFILLED_SYRINGE | Freq: Once | SUBCUTANEOUS | Status: AC
  Administered 2022-12-10: 210 mg via SUBCUTANEOUS
  Filled 2022-12-10: qty 2.34

## 2022-12-10 NOTE — Progress Notes (Signed)
Diagnosis: Osteoporosis  Provider:  Chilton Greathouse MD  Procedure: Injection  Evenity (Romosozumab-aqqg), Dose: 210 mg, Site: subcutaneous, Number of injections: 2  Post Care: Patient declined observation  Discharge: Condition: Good, Destination: Home . AVS Provided  Performed by:  Adriana Mccallum, RN

## 2022-12-25 ENCOUNTER — Ambulatory Visit: Payer: Medicare Other | Admitting: Podiatry

## 2022-12-25 ENCOUNTER — Encounter: Payer: Self-pay | Admitting: Podiatry

## 2022-12-25 VITALS — Ht 62.0 in | Wt 117.6 lb

## 2022-12-25 DIAGNOSIS — B351 Tinea unguium: Secondary | ICD-10-CM

## 2022-12-25 DIAGNOSIS — M79675 Pain in left toe(s): Secondary | ICD-10-CM

## 2022-12-25 DIAGNOSIS — M79674 Pain in right toe(s): Secondary | ICD-10-CM | POA: Diagnosis not present

## 2022-12-25 NOTE — Progress Notes (Signed)
Subjective:  Patient ID: Whitney Henderson, female    DOB: 15-Aug-1928,  MRN: [id number]  [name] presents to clinic today for at risk foot care with h/o CKD and callus(es) right heel and painful thick toenails that are difficult to trim. Painful toenails interfere with ambulation. Aggravating factors include wearing enclosed shoe gear. Pain is relieved with periodic professional debridement. Painful calluses are aggravated when weightbearing with and without shoegear. Pain is relieved with periodic professional debridement. She is accompanied by her daughter on today's visit. Patient is c/o painful left heel and bilateral great toes which are tender. Patient has h/o hip fx with repair. Spent some time in rehab and is now home and uses a hospital bed. Chief Complaint  Patient presents with   Nail Problem    Pt is here for RFC, not a diabetic PCP is Dr [name] and LOV was in October.   PCP is [name], MD.  Allergies  Allergen Reactions   Exelon [Rivastigmine] Itching and Rash   Fish Allergy Other (See Comments)    Does not do well with Seafood.   Codeine Rash   Review of Systems: Negative except as noted in the HPI.  Objective:  There were no vitals filed for this visit. Constitutional Patient is a pleasant 89 y.o. female thin build in NAD. AAO x 3.  Vascular Capillary fill time to digits <3 seconds.  DP/PT pulse(s) are faintly palpable b/l lower extremities. Pedal hair absent b/l. Lower extremity skin temperature gradient warm to cool b/l. No pain with calf compression b/l. No cyanosis or clubbing noted. No ischemia nor gangrene noted b/l.   Neurologic Protective sensation intact 5/5 intact bilaterally with 10g monofilament b/l. Vibratory sensation intact b/l. No clonus b/l.   Dermatologic Pedal skin is thin, shiny and atrophic b/l.  No open wounds b/l lower extremities. No interdigital macerations b/l lower extremities. Toenails 1-5 b/l elongated, discolored, dystrophic,  thickened, crumbly with subungual debris and tenderness to dorsal palpation.   Hyperkeratotic lesion(s) posterolateral aspect right heel.  No erythema, no edema, no drainage, no fluctuance.  Orthopedic: Normal muscle strength 5/5 to all lower extremity muscle groups bilaterally. HAV with bunion deformity noted b/l LE. Hammertoe deformity noted 2-5 b/l. Utilizes cane for ambulation assistance.   Assessment:   1. Pain due to onychomycosis of toenails of both feet    Plan:  Patient was evaluated and treated and all questions answered. Consent given for treatment as described below: -Patient's family member present. All questions/concerns addressed on today's visit. -Family member/caregiver/POA instructed to continue pressure precautions floating heels when patient is in bed. -Toenails 1-5 b/l were debrided in length and girth with sterile nail nippers and dremel without iatrogenic bleeding.  -As a courtesy, callus(es) right heel pared utilizing sterile scalpel blade without complication or incident. Total number debrided =1. Will obtain precert from insurance company for future visits. -Recommended daily use of Bag Balm Hand and Body Moistuizer which may be purchased at local drug store or on [employer]. -Patient/POA to call should there be question/concern in the interim.  Return in about 3 months (around 03/25/2023).  Freddie Breech, DPM     [facility] LOCATION: [address], [place] [address]  Office [phone]   [facility] LOCATION: [address] [place], [place] [address] Office [phone]

## 2023-01-09 ENCOUNTER — Ambulatory Visit: Payer: Medicare Other

## 2023-01-09 VITALS — BP 176/76 | HR 63 | Temp 97.7°F | Resp 22 | Ht 62.0 in | Wt 116.4 lb

## 2023-01-09 DIAGNOSIS — M8000XA Age-related osteoporosis with current pathological fracture, unspecified site, initial encounter for fracture: Secondary | ICD-10-CM

## 2023-01-09 DIAGNOSIS — S72002D Fracture of unspecified part of neck of left femur, subsequent encounter for closed fracture with routine healing: Secondary | ICD-10-CM

## 2023-01-09 MED ORDER — ROMOSOZUMAB-AQQG 105 MG/1.17ML ~~LOC~~ SOSY
210.0000 mg | PREFILLED_SYRINGE | Freq: Once | SUBCUTANEOUS | Status: AC
Start: 2023-01-09 — End: 2023-01-09
  Administered 2023-01-09: 210 mg via SUBCUTANEOUS
  Filled 2023-01-09: qty 2.34

## 2023-01-09 NOTE — Progress Notes (Signed)
Diagnosis: Osteoporosis  Provider:  Chilton Greathouse MD  Procedure: Injection  Evenity (Romosozumab-aqqg), Dose: 210 mg, Site: subcutaneous, Number of injections: 2  Injection Site(s): Left lower quad. abdomen and Right lower quad. abdomne   Discharge: Condition: Good, Destination: Home . AVS Provided  Performed by:  Nat Math, RN

## 2023-02-06 ENCOUNTER — Ambulatory Visit: Payer: Medicare Other

## 2023-02-06 VITALS — BP 195/83 | HR 62 | Temp 97.7°F | Resp 18 | Ht 62.0 in | Wt 117.8 lb

## 2023-02-06 DIAGNOSIS — M8000XD Age-related osteoporosis with current pathological fracture, unspecified site, subsequent encounter for fracture with routine healing: Secondary | ICD-10-CM

## 2023-02-06 DIAGNOSIS — M8000XA Age-related osteoporosis with current pathological fracture, unspecified site, initial encounter for fracture: Secondary | ICD-10-CM

## 2023-02-06 DIAGNOSIS — S72002D Fracture of unspecified part of neck of left femur, subsequent encounter for closed fracture with routine healing: Secondary | ICD-10-CM | POA: Diagnosis not present

## 2023-02-06 MED ORDER — ROMOSOZUMAB-AQQG 105 MG/1.17ML ~~LOC~~ SOSY
210.0000 mg | PREFILLED_SYRINGE | Freq: Once | SUBCUTANEOUS | Status: AC
Start: 2023-02-06 — End: 2023-02-06
  Administered 2023-02-06: 210 mg via SUBCUTANEOUS
  Filled 2023-02-06: qty 2.34

## 2023-02-06 NOTE — Progress Notes (Signed)
Diagnosis: Osteoporosis  Provider:  Chilton Greathouse MD  Procedure: Injection  Evenity (Romosozumab-aqqg), Dose: 210 mg, Site: subcutaneous, Number of injections: 2  Injection Site(s): Left lower quad. abdomen and Right lower quad. abdomne  Post Care:  N/A  Discharge: Condition: Good, Destination: Home . AVS Provided  Performed by:  Nat Math, RN

## 2023-02-11 ENCOUNTER — Ambulatory Visit: Payer: Medicare Other | Admitting: Internal Medicine

## 2023-02-11 ENCOUNTER — Encounter: Payer: Self-pay | Admitting: Internal Medicine

## 2023-02-11 VITALS — BP 130/76 | HR 67 | Temp 98.1°F | Ht 62.0 in | Wt 118.0 lb

## 2023-02-11 DIAGNOSIS — Z79899 Other long term (current) drug therapy: Secondary | ICD-10-CM

## 2023-02-11 DIAGNOSIS — G20C Parkinsonism, unspecified: Secondary | ICD-10-CM | POA: Diagnosis not present

## 2023-02-11 DIAGNOSIS — M810 Age-related osteoporosis without current pathological fracture: Secondary | ICD-10-CM | POA: Diagnosis not present

## 2023-02-11 DIAGNOSIS — I129 Hypertensive chronic kidney disease with stage 1 through stage 4 chronic kidney disease, or unspecified chronic kidney disease: Secondary | ICD-10-CM | POA: Diagnosis not present

## 2023-02-11 DIAGNOSIS — N1832 Chronic kidney disease, stage 3b: Secondary | ICD-10-CM | POA: Diagnosis not present

## 2023-02-11 DIAGNOSIS — M816 Localized osteoporosis [Lequesne]: Secondary | ICD-10-CM

## 2023-02-11 DIAGNOSIS — G3184 Mild cognitive impairment, so stated: Secondary | ICD-10-CM

## 2023-02-11 NOTE — Patient Instructions (Signed)
Hypertension, Adult Hypertension is another name for high blood pressure. High blood pressure forces your heart to work harder to pump blood. This can cause problems over time. There are two numbers in a blood pressure reading. There is a top number (systolic) over a bottom number (diastolic). It is best to have a blood pressure that is below 120/80. What are the causes? The cause of this condition is not known. Some other conditions can lead to high blood pressure. What increases the risk? Some lifestyle factors can make you more likely to develop high blood pressure: Smoking. Not getting enough exercise or physical activity. Being overweight. Having too much fat, sugar, calories, or salt (sodium) in your diet. Drinking too much alcohol. Other risk factors include: Having any of these conditions: Heart disease. Diabetes. High cholesterol. Kidney disease. Obstructive sleep apnea. Having a family history of high blood pressure and high cholesterol. Age. The risk increases with age. Stress. What are the signs or symptoms? High blood pressure may not cause symptoms. Very high blood pressure (hypertensive crisis) may cause: Headache. Fast or uneven heartbeats (palpitations). Shortness of breath. Nosebleed. Vomiting or feeling like you may vomit (nauseous). Changes in how you see. Very bad chest pain. Feeling dizzy. Seizures. How is this treated? This condition is treated by making healthy lifestyle changes, such as: Eating healthy foods. Exercising more. Drinking less alcohol. Your doctor may prescribe medicine if lifestyle changes do not help enough and if: Your top number is above 130. Your bottom number is above 80. Your personal target blood pressure may vary. Follow these instructions at home: Eating and drinking  If told, follow the DASH eating plan. To follow this plan: Fill one half of your plate at each meal with fruits and vegetables. Fill one fourth of your plate  at each meal with whole grains. Whole grains include whole-wheat pasta, brown rice, and whole-grain bread. Eat or drink low-fat dairy products, such as skim milk or low-fat yogurt. Fill one fourth of your plate at each meal with low-fat (lean) proteins. Low-fat proteins include fish, chicken without skin, eggs, beans, and tofu. Avoid fatty meat, cured and processed meat, or chicken with skin. Avoid pre-made or processed food. Limit the amount of salt in your diet to less than 1,500 mg each day. Do not drink alcohol if: Your doctor tells you not to drink. You are pregnant, may be pregnant, or are planning to become pregnant. If you drink alcohol: Limit how much you have to: 0-1 drink a day for women. 0-2 drinks a day for men. Know how much alcohol is in your drink. In the [place] one drink equals one 12 oz bottle of beer (355 mL), one 5 oz glass of wine (148 mL), or one 1 oz glass of hard liquor (44 mL). Lifestyle  Work with your doctor to stay at a healthy weight or to lose weight. Ask your doctor what the best weight is for you. Get at least 30 minutes of exercise that causes your heart to beat faster (aerobic exercise) most days of the week. This may include walking, swimming, or biking. Get at least 30 minutes of exercise that strengthens your muscles (resistance exercise) at least 3 days a week. This may include lifting weights or doing Pilates. Do not smoke or use any products that contain nicotine or tobacco. If you need help quitting, ask your doctor. Check your blood pressure at home as told by your doctor. Keep all follow-up visits. Medicines Take over-the-counter and prescription medicines   only as told by your doctor. Follow directions carefully. Do not skip doses of blood pressure medicine. The medicine does not work as well if you skip doses. Skipping doses also puts you at risk for problems. Ask your doctor about side effects or reactions to medicines that you should watch  for. Contact a doctor if: You think you are having a reaction to the medicine you are taking. You have headaches that keep coming back. You feel dizzy. You have swelling in your ankles. You have trouble with your vision. Get help right away if: You get a very bad headache. You start to feel mixed up (confused). You feel weak or numb. You feel faint. You have very bad pain in your: Chest. Belly (abdomen). You vomit more than once. You have trouble breathing. These symptoms may be an emergency. Get help right away. Call 911. Do not wait to see if the symptoms will go away. Do not drive yourself to the hospital. Summary Hypertension is another name for high blood pressure. High blood pressure forces your heart to work harder to pump blood. For most people, a normal blood pressure is less than 120/80. Making healthy choices can help lower blood pressure. If your blood pressure does not get lower with healthy choices, you may need to take medicine. This information is not intended to replace advice given to you by your health care provider. Make sure you discuss any questions you have with your health care provider. Document Revised: 10/26/2020 Document Reviewed: 10/26/2020 Elsevier Patient Education  2024 [employer]  

## 2023-02-11 NOTE — Progress Notes (Signed)
I,[name], CMA,acting as a Neurosurgeon for [name], MD.,have documented all relevant documentation on the behalf of [name], MD,as directed by  [name], MD while in the presence of [name], MD.  Subjective:  Patient ID: Whitney Henderson , female    DOB: Nov 23, 1928 , 89 y.o.   MRN: [id number]  Chief Complaint  Patient presents with   Hypertension    HPI  She presents for BP check. She reports compliance with meds. She is accompanied by her daughter, V. Today.  Patient denies having any headaches, chest pain and shortness of breath.       Hypertension This is a chronic problem. The current episode started more than 1 year ago. The problem has been gradually improving since onset. The problem is uncontrolled. Pertinent negatives include no blurred vision, chest pain, headaches, neck pain, orthopnea, palpitations or shortness of breath. The current treatment provides moderate improvement. Compliance problems include exercise.      Past Medical History:  Diagnosis Date   Anxiety    Cataracts, bilateral    Discoid lupus    Headache 04/27/2015   Left>right periorbital   Hypertension    Memory difficulties 09/04/2016     Family History  Problem Relation Age of Onset   Early death Mother    Cancer Father    Thyroid disease Daughter    Breast cancer Daughter        71s   Glaucoma Daughter    Hypertension Daughter    Heart attack Son    Thyroid disease Son      Current Outpatient Medications:    acetaminophen (TYLENOL) 500 MG tablet, Take 500 mg by mouth every 6 (six) hours as needed for mild pain or moderate pain., Disp: , Rfl:    amLODipine (NORVASC) 2.5 MG tablet, TAKE 1 TABLET(2.5 MG) BY MOUTH TWICE DAILY WITH BREAKFAST AND DINNER, Disp: 180 tablet, Rfl: 1   Apoaequorin (PREVAGEN PO), Take 1 capsule by mouth every other day., Disp: , Rfl:    betamethasone dipropionate (DIPROLENE) 0.05 % cream, APPLY THIN LAYER EXTERNALLY TO THE AFFECTED  AREA EVERY DAY AS NEEDED (Patient taking differently: 1 application  daily as needed (skin irritation).), Disp: 60 g, Rfl: 2   Calcium Carb-Cholecalciferol (CALCIUM 500 +D PO), Take 1 tablet by mouth every other day., Disp: , Rfl:    carbidopa-levodopa (SINEMET IR) 25-100 MG tablet, Take 1 tablet by mouth 3 (three) times daily., Disp: 90 tablet, Rfl: 11   Multiple Vitamins-Minerals (CENTRUM SILVER PO), Take 1 tablet by mouth every other day., Disp: , Rfl:    sertraline (ZOLOFT) 25 MG tablet, TAKE 1 TABLET(25 MG) BY MOUTH DAILY, Disp: 90 tablet, Rfl: 1   thiamine 100 MG tablet, Take 100 mg by mouth daily., Disp: , Rfl:    triamcinolone cream (KENALOG) 0.1 %, APPLY TO AFFECTED AREA TWICE DAILY AS NEEDED (Patient taking differently: Apply 1 Application topically 2 (two) times daily as needed (for flares).), Disp: 45 g, Rfl: 0   vitamin C (ASCORBIC ACID) 500 MG tablet, Take 500 mg by mouth every other day., Disp: , Rfl:    aspirin 81 MG chewable tablet, Chew 1 tablet (81 mg total) by mouth 2 (two) times daily. (Patient not taking: Reported on 02/11/2023), Disp: 30 tablet, Rfl: 0   cephALEXin (KEFLEX) 500 MG capsule, Keflex 500mg  every 12 hours, Disp: 14 capsule, Rfl: 0   Propylene Glycol (SYSTANE BALANCE OP), Place 1 drop into both eyes daily  as needed (for itchy eyes). (Patient not taking: Reported on 02/11/2023), Disp: , Rfl:  No current facility-administered medications for this visit.  Facility-Administered Medications Ordered in Other Visits:    Romosozumab-aqqg (EVENITY) 105 MG/1. injection 210 mg, 210 mg, Subcutaneous, Once, Gearldine Bienenstock, PA-C   Allergies  Allergen Reactions   Exelon [Rivastigmine] Itching and Rash   Fish Allergy Other (See Comments)    Does not do well with Seafood.   Codeine Rash     Review of Systems  Constitutional: Negative.   Eyes: Negative.  Negative for blurred vision.  Respiratory: Negative.  Negative for shortness of breath.   Cardiovascular: Negative.   Negative for chest pain, palpitations and orthopnea.  Gastrointestinal: Negative.   Genitourinary: Negative.   Musculoskeletal:  Negative for neck pain.  Skin: Negative.   Neurological:  Negative for headaches.  Psychiatric/Behavioral: Negative.       Today's Vitals   02/11/23 1044  BP: 130/76  Pulse: 67  Temp: 98.1 F (36.7 C)  TempSrc: Oral  Weight: 118 lb (53.5 kg)  Height: 5\' 2"  (1.575 m)  PainSc: 0-No pain   Body mass index is 21.58 kg/m.  Wt Readings from Last 3 Encounters:  02/11/23 118 lb (53.5 kg)  02/06/23 117 lb 12.8 oz (53.4 kg)  01/09/23 116 lb 6.4 oz (52.8 kg)      Objective:  Physical Exam Vitals and nursing note reviewed.  Constitutional:      Appearance: Normal appearance.  HENT:     Head: Normocephalic and atraumatic.  Eyes:     Extraocular Movements: Extraocular movements intact.  Cardiovascular:     Rate and Rhythm: Normal rate and regular rhythm.     Heart sounds: Normal heart sounds.  Pulmonary:     Effort: Pulmonary effort is normal.     Breath sounds: Normal breath sounds.  Musculoskeletal:     Cervical back: Normal range of motion.  Skin:    General: Skin is warm.  Neurological:     General: No focal deficit present.     Mental Status: She is alert.  Psychiatric:        Mood and Affect: Mood normal.        Behavior: Behavior normal.         Assessment And Plan:  Parenchymal renal hypertension, stage 1 through stage 4 or unspecified chronic kidney disease Assessment & Plan: Chronic, controlled. She will c/w amlodipine 2.5mg  twice daily(she did not tolerate once daily dosing).  She is encouraged to follow low sodium diet. She will f/u in 4-6 months for re-evaluation.  Orders: -     CBC -     CMP14+EGFR -     Lipid panel -     Microalbumin / creatinine urine ratio  Stage 3b chronic kidney disease (HCC) Assessment & Plan: I will check labs as below. She is encouraged to stay well hydrated, avoid NSAIDs and keep BP controlled  to prevent progression of CKD.    Orders: -     Microalbumin / creatinine urine ratio -     PTH, intact and calcium -     Phosphorus -     Protein electrophoresis, serum  Parkinsonism, unspecified Parkinsonism type (HCC) Assessment & Plan: Chronic, she has been evaluated by Neuro. She is advised to take Sinemet as per Neuro. Most recent Neuro note reviewed.    Age related osteoporosis, unspecified pathological fracture presence -     TSH  Drug therapy    Return if symptoms  worsen or fail to improve.  Patient was given opportunity to ask questions. Patient verbalized understanding of the plan and was able to repeat key elements of the plan. All questions were answered to their satisfaction.    I, [name], MD, have reviewed all documentation for this visit. The documentation on 02/11/23 for the exam, diagnosis, procedures, and orders are all accurate and complete.   IF YOU HAVE BEEN REFERRED TO A SPECIALIST, IT MAY TAKE 1-2 WEEKS TO SCHEDULE/PROCESS THE REFERRAL. IF YOU HAVE NOT HEARD FROM US/SPECIALIST IN TWO WEEKS, PLEASE GIVE [place] A CALL AT [phone].

## 2023-02-13 ENCOUNTER — Ambulatory Visit: Payer: Self-pay

## 2023-02-13 NOTE — Patient Instructions (Signed)
Visit Information  Thank you for taking time to visit with me today. Please don't hesitate to contact me if I can be of assistance to you.   Following are the goals we discussed today:   Goals Addressed             This Visit's Progress    To optimize treatment for Parkinson's disease       Care Coordination Interventions: Evaluation of current treatment plan related to Parkinson's disease  and patient's adherence to plan as established by provider Educated patient and daughter with basic disease process related to PD, including change in memory and risk for parkinson's induced psychosis  Discussed with daughter patient is experiencing crying spells and hallucinations, PCP was made aware Reviewed and discussed patient's medications for treatment of PD, discussed patient has not been taking her afternoon dose of Carbidopa/Levodopa  Educated patient and daughter about the importance of taking this medication exactly as prescribed for best effectiveness, educated to take take this medication 30-60 minutes before or after eating a meal;  Eat smaller meals: Eat smaller meals throughout the day instead of large meals; Eat less protein at breakfast and lunch: Limit protein intake at breakfast and lunch, and eat your main protein meal in the evening; Eat a low-protein snack with your medication: Eating a low-protein snack, like a cracker or toast, can help with nausea caused by levodopa Determined patient will resume taking this medication 3 times daily as prescribed and as recommended with the above stated instructions  Discussed daughter [name] will notify patient's Neurologist if her symptoms persist or worsen       COMPLETED: To start in home PT to work on strengthening and balance       Care Coordination Interventions: Evaluation of current treatment plan related to abnormal gait and patient's adherence to plan as established by provider Determined patient has completed her PT, she continues to  use a cane and or a walker for ambulation  Assessed for falls since last encounter, patient denies  Assessed patients knowledge of fall risk prevention secondary to previously provided education Reinforced fall precautions and to notify PCP if a fall occurs          Our next appointment is by telephone on 03/13/23 at 12:45 PM  Please call the care guide team at [phone] if you need to cancel or reschedule your appointment.   If you are experiencing a Mental Health or Behavioral Health Crisis or need someone to talk to, please call 1-800-273-TALK (toll free, 24 hour hotline)  The patient verbalized understanding of instructions, educational materials, and care plan provided today and DECLINED offer to receive copy of patient instructions, educational materials, and care plan.   Delsa Sale RN BSN CCM [place]  [facility], [facility] Health Nurse Care Coordinator  Direct Dial: [phone] Website: [email or web address]

## 2023-02-14 NOTE — Patient Outreach (Signed)
Care Coordination   Follow Up Visit Note   02/13/2023 Name: Whitney Henderson MRN: [id number] DOB: [birth year]  Whitney Henderson is a 89 y.o. year old female who sees Whitney Peng, MD for primary care. I spoke with  Whitney Henderson by phone today.  What matters to the patients health and wellness today?  Patient would like to continue to manage her Parkinson's disease without having worsening symptoms.     Goals Addressed             This Visit's Progress    To optimize treatment for Parkinson's disease       Care Coordination Interventions: Evaluation of current treatment plan related to Parkinson's disease  and patient's adherence to plan as established by provider Educated patient and daughter with basic disease process related to PD, including change in memory and risk for parkinson's induced psychosis  Discussed with daughter patient is experiencing crying spells and hallucinations, PCP was made aware Reviewed and discussed patient's medications for treatment of PD, discussed patient has not been taking her afternoon dose of Carbidopa/Levodopa  Educated patient and daughter about the importance of taking this medication exactly as prescribed for best effectiveness, educated to take take this medication 30-60 minutes before or after eating a meal;  Eat smaller meals: Eat smaller meals throughout the day instead of large meals; Eat less protein at breakfast and lunch: Limit protein intake at breakfast and lunch, and eat your main protein meal in the evening; Eat a low-protein snack with your medication: Eating a low-protein snack, like a cracker or toast, can help with nausea caused by levodopa Determined patient will resume taking this medication 3 times daily as prescribed and as recommended with the above stated instructions  Discussed daughter Whitney Henderson will notify patient's Neurologist if her symptoms persist or worsen       COMPLETED: To start in home PT to work on strengthening and  balance       Care Coordination Interventions: Evaluation of current treatment plan related to abnormal gait and patient's adherence to plan as established by provider Determined patient has completed her PT, she continues to use a cane and or a walker for ambulation  Assessed for falls since last encounter, patient denies  Assessed patients knowledge of fall risk prevention secondary to previously provided education Reinforced fall precautions and to notify PCP if a fall occurs     Interventions Today    Flowsheet Row Most Recent Value  Chronic Disease   Chronic disease during today's visit Other  [Parkinson's disease]  General Interventions   General Interventions Discussed/Reviewed General Interventions Discussed, General Interventions Reviewed, Doctor Visits, Durable Medical Equipment (DME), Labs  Doctor Visits Discussed/Reviewed Doctor Visits Discussed, Doctor Visits Reviewed, PCP  Durable Medical Equipment (DME) Walker  Exercise Interventions   Exercise Discussed/Reviewed Physical Activity  Physical Activity Discussed/Reviewed Physical Activity Reviewed, Physical Activity Discussed  Education Interventions   Education Provided Provided Education  Provided Verbal Education On When to see the doctor, Labs, Exercise, Nutrition, Medication  Nutrition Interventions   Nutrition Discussed/Reviewed Nutrition Discussed, Nutrition Reviewed, Fluid intake, Increasing proteins  Pharmacy Interventions   Pharmacy Dicussed/Reviewed Pharmacy Topics Discussed, Pharmacy Topics Reviewed, Medications and their functions          SDOH assessments and interventions completed:  Yes  SDOH Interventions Today    Flowsheet Row Most Recent Value  SDOH Interventions   Food Insecurity Interventions Intervention Not Indicated  Housing Interventions Intervention Not Indicated  Transportation Interventions Intervention Not  Indicated  Utilities Interventions Intervention Not Indicated        Care  Coordination Interventions:  Yes, provided   Follow up plan: Follow up call scheduled for 03/13/23 @12 :45 PM    Encounter Outcome:  Patient Visit Completed

## 2023-02-17 ENCOUNTER — Ambulatory Visit: Payer: Medicare Other

## 2023-02-17 NOTE — Patient Outreach (Signed)
  Care Coordination   Follow Up Visit Note   02/17/2023 Name: Whitney Henderson MRN: [id number] DOB: 08-22-1928  Whitney Henderson is a 89 y.o. year old female who sees [name], MD for primary care. I spoke with  Whitney Henderson by phone today.  What matters to the patients health and wellness today?  Patient would like to have her urine checked for a UTI.     Goals Addressed             This Visit's Progress    To check urine for UTI       Care Coordination Interventions: Evaluation of current treatment plan related to UTI and patient's adherence to plan as established by provider Received inbound call from daughter Whitney Henderson asking for the lab hours of operation Discussed Whitney Henderson will obtain a urine specimen from patient today, she will refrigerate the sample and bring it into the office tomorrow am  Advised Dr. [name] and Randa Lynn CMA of patient/daughters plan to bring in a urine sample tomorrow am due to suspect of UTI       Interventions Today    Flowsheet Row Most Recent Value  Chronic Disease   Chronic disease during today's visit Other  [UTI]  General Interventions   General Interventions Discussed/Reviewed General Interventions Discussed, General Interventions Reviewed, Doctor Visits, Communication with, Labs  Doctor Visits Discussed/Reviewed Doctor Visits Discussed, Doctor Visits Reviewed, PCP  Communication with PCP/Specialists  [Dr. [name],  Randa Lynn CMA]  Education Interventions   Education Provided Provided Education  Provided Verbal Education On When to see the doctor          SDOH assessments and interventions completed:  No     Care Coordination Interventions:  Yes, provided   Follow up plan: Follow up call scheduled for 03/13/23 12:45 pm    Encounter Outcome:  Patient Visit Completed

## 2023-02-17 NOTE — Patient Instructions (Signed)
Visit Information  Thank you for taking time to visit with me today. Please don't hesitate to contact me if I can be of assistance to you.   Following are the goals we discussed today:   Goals Addressed             This Visit's Progress    To check urine for UTI       Care Coordination Interventions: Evaluation of current treatment plan related to UTI and patient's adherence to plan as established by provider Received inbound call from daughter [name] asking for the lab hours of operation Discussed [name] will obtain a urine specimen from patient today, she will refrigerate the sample and bring it into the office tomorrow am  Advised Dr. [name] and Randa Lynn CMA of patient/daughters plan to bring in a urine sample tomorrow am due to suspect of UTI         Our next appointment is by telephone on 03/13/23 at 12:45 PM  Please call the care guide team at [phone] if you need to cancel or reschedule your appointment.   If you are experiencing a Mental Health or Behavioral Health Crisis or need someone to talk to, please call 1-800-273-TALK (toll free, 24 hour hotline)  The patient verbalized understanding of instructions, educational materials, and care plan provided today and DECLINED offer to receive copy of patient instructions, educational materials, and care plan.   Delsa Sale RN BSN CCM [place]  [facility], [place] [facility] Health Nurse Care Coordinator  Direct Dial: [phone] Website: [email or web address]

## 2023-02-18 ENCOUNTER — Other Ambulatory Visit (INDEPENDENT_AMBULATORY_CARE_PROVIDER_SITE_OTHER): Payer: Medicare Other

## 2023-02-18 ENCOUNTER — Other Ambulatory Visit: Payer: Self-pay

## 2023-02-18 DIAGNOSIS — I10 Essential (primary) hypertension: Secondary | ICD-10-CM | POA: Diagnosis not present

## 2023-02-18 DIAGNOSIS — R82998 Other abnormal findings in urine: Secondary | ICD-10-CM

## 2023-02-18 LAB — POCT URINALYSIS DIPSTICK
Bilirubin, UA: NEGATIVE
Blood, UA: NEGATIVE
Glucose, UA: NEGATIVE
Ketones, UA: NEGATIVE
Nitrite, UA: NEGATIVE
Protein, UA: NEGATIVE
Spec Grav, UA: <=1.005 — AB (ref 1.010–1.025)
Urobilinogen, UA: 0.2 U/dL
pH, UA: 5 (ref 5.0–8.0)

## 2023-02-19 LAB — CMP14+EGFR
ALT: 10 [IU]/L (ref 0–32)
AST: 22 [IU]/L (ref 0–40)
Albumin: 4.1 g/dL (ref 3.6–4.6)
Alkaline Phosphatase: 97 [IU]/L (ref 44–121)
BUN/Creatinine Ratio: 18 (ref 12–28)
BUN: 24 mg/dL (ref 10–36)
Bilirubin Total: 0.3 mg/dL (ref 0.0–1.2)
CO2: 19 mmol/L — ABNORMAL LOW (ref 20–29)
Calcium: 9.3 mg/dL (ref 8.7–10.3)
Chloride: 106 mmol/L (ref 96–106)
Creatinine, Ser: 1.32 mg/dL — ABNORMAL HIGH (ref 0.57–1.00)
Globulin, Total: 3.2 g/dL (ref 1.5–4.5)
Glucose: 102 mg/dL — ABNORMAL HIGH (ref 70–99)
Potassium: 4.7 mmol/L (ref 3.5–5.2)
Sodium: 140 mmol/L (ref 134–144)
Total Protein: 7.3 g/dL (ref 6.0–8.5)
eGFR: 38 mL/min/{1.73_m2} — ABNORMAL LOW (ref >59)

## 2023-02-19 LAB — PROTEIN ELECTROPHORESIS, SERUM
A/G Ratio: 1 (ref 0.7–1.7)
Albumin ELP: 3.6 g/dL (ref 2.9–4.4)
Alpha 1: 0.2 g/dL (ref 0.0–0.4)
Alpha 2: 0.9 g/dL (ref 0.4–1.0)
Beta: 1.2 g/dL (ref 0.7–1.3)
Gamma Globulin: 1.4 g/dL (ref 0.4–1.8)
Globulin, Total: 3.7 g/dL (ref 2.2–3.9)

## 2023-02-19 LAB — CBC
Hematocrit: 35.5 % (ref 34.0–46.6)
Hemoglobin: 11.4 g/dL (ref 11.1–15.9)
MCH: 28.4 pg (ref 26.6–33.0)
MCHC: 32.1 g/dL (ref 31.5–35.7)
MCV: 89 fL (ref 79–97)
Platelets: 201 10*3/uL (ref 150–450)
RBC: 4.01 x10E6/uL (ref 3.77–5.28)
RDW: 13.3 % (ref 11.7–15.4)
WBC: 3.7 10*3/uL (ref 3.4–10.8)

## 2023-02-19 LAB — LIPID PANEL
Chol/HDL Ratio: 2.2 {ratio} (ref 0.0–4.4)
Cholesterol, Total: 218 mg/dL — ABNORMAL HIGH (ref 100–199)
HDL: 97 mg/dL (ref >39)
LDL Chol Calc (NIH): 108 mg/dL — ABNORMAL HIGH (ref 0–99)
Triglycerides: 72 mg/dL (ref 0–149)
VLDL Cholesterol Cal: 13 mg/dL (ref 5–40)

## 2023-02-19 LAB — TSH: TSH: 1.92 u[IU]/mL (ref 0.450–4.500)

## 2023-02-19 LAB — PTH, INTACT AND CALCIUM: PTH: 36 pg/mL (ref 15–65)

## 2023-02-19 LAB — PHOSPHORUS: Phosphorus: 2.9 mg/dL — ABNORMAL LOW (ref 3.0–4.3)

## 2023-02-20 ENCOUNTER — Other Ambulatory Visit: Payer: Self-pay | Admitting: Internal Medicine

## 2023-02-20 LAB — URINE CULTURE

## 2023-02-20 MED ORDER — CEPHALEXIN 500 MG PO CAPS: ORAL_CAPSULE | ORAL | 0 refills | Status: DC

## 2023-02-22 DIAGNOSIS — M810 Age-related osteoporosis without current pathological fracture: Secondary | ICD-10-CM | POA: Insufficient documentation

## 2023-02-22 NOTE — Assessment & Plan Note (Signed)
I will check labs as below. She is encouraged to stay well hydrated, avoid NSAIDs and keep BP controlled to prevent progression of CKD.

## 2023-02-22 NOTE — Assessment & Plan Note (Signed)
Chronic, controlled. She will c/w amlodipine 2.5mg  twice daily(she did not tolerate once daily dosing).  She is encouraged to follow low sodium diet. She will f/u in 4-6 months for re-evaluation.

## 2023-02-22 NOTE — Assessment & Plan Note (Addendum)
Chronic, she has been evaluated by Neuro. She is advised to take Sinemet as per Neuro. Most recent Neuro note reviewed.

## 2023-03-03 ENCOUNTER — Ambulatory Visit: Payer: Self-pay | Admitting: Licensed Clinical Social Worker

## 2023-03-03 NOTE — Patient Outreach (Signed)
  Care Coordination   Initial Visit Note   03/03/2023 Name: Whitney Henderson MRN: [id number] DOB: Jul 23, 1928  Whitney Henderson is a 89 y.o. year old female who sees Cleave Curling, MD for primary care. I spoke with  Whitney Henderson and her daughter Whitney Henderson phone today.  What matters to the patients health and wellness today?  LTC Placement     Goals Addressed             This Visit's Progress    Care coordination Activities       Care Coordination Interventions: Patient and daughter were on the call today and wanted to know more information on LTC steps for placement.SW provided the informaiton.  Family is not ready at this time for placement,it will be something for later in the year.  SW will gather some resources to mail to the patient about the steps for long term care and explained that when ready the family can also apply for LTC Medicaid.  Sw provided the family the number to adult placement at DSS for further questions  SW went over all the SDOH Questions and there were no other needs at this time.  SW will follow back up with the patient and her daughter on 03/27/2023 at 1:15 pm, this is the time requested by the patient.         SDOH assessments and interventions completed:  Yes  SDOH Interventions Today    Flowsheet Row Most Recent Value  SDOH Interventions   Food Insecurity Interventions Intervention Not Indicated  Housing Interventions Intervention Not Indicated  Transportation Interventions Intervention Not Indicated  Utilities Interventions Intervention Not Indicated        Care Coordination Interventions:  Yes, provided  Interventions Today    Flowsheet Row Most Recent Value  General Interventions   General Interventions Discussed/Reviewed General Interventions Discussed, Community Resources  [SW educated the family on the steps to LTC placement, SW will mail som information abuout LTC steps for placement]        Follow up plan: Follow up call  scheduled for 03/27/2023 at 1:15 pm    Encounter Outcome:  Patient Visit Completed   Jonda Neighbours, PhD [facility], [facility] Social Worker Direct Dial: [phone]  Fax: [phone]

## 2023-03-03 NOTE — Patient Instructions (Signed)
 Visit Information  Thank you for taking time to visit with me today. Please don't hesitate to contact me if I can be of assistance to you.   Following are the goals we discussed today:   Goals Addressed             This Visit's Progress    Care coordination Activities       Care Coordination Interventions: Patient and daughter were on the call today and wanted to know more information on LTC steps for placement.SW provided the informaiton.  Family is not ready at this time for placement,it will be something for later in the year.  SW will gather some resources to mail to the patient about the steps for long term care and explained that when ready the family can also apply for LTC Medicaid.  Sw provided the family the number to adult placement at DSS for further questions  SW went over all the SDOH Questions and there were no other needs at this time.  SW will follow back up with the patient and her daughter on 03/27/2023 at 1:15 pm, this is the time requested by the patient.         Our next appointment is by telephone on 03/27/2023 at 1:15 pm  Please call the care guide team at [phone] if you need to cancel or reschedule your appointment.   If you are experiencing a Mental Health or Behavioral Health Crisis or need someone to talk to, please call the Suicide and Crisis Lifeline: 988 go to [facility] [address], [place] [phone]) call 911  The patient verbalized understanding of instructions, educational materials, and care plan provided today and DECLINED offer to receive copy of patient instructions, educational materials, and care plan.   Jonda Neighbours, PhD [facility], [facility] Social Worker Direct Dial: [phone]  Fax: [phone]

## 2023-03-06 ENCOUNTER — Ambulatory Visit: Payer: Medicare Other

## 2023-03-06 NOTE — Progress Notes (Unsigned)
 Office Visit Note  Patient: Whitney Henderson             Date of Birth: September 03, 1928           MRN: [id number]             PCP: [name], MD Referring: [name], MD Visit Date: 03/20/2023 Occupation: @GUAROCC @  Subjective:  Medication monitoring   History of Present Illness: Whitney Henderson is a 89 y.o. female with history of osteoporosis. Patient was last seen in the office 05/23/22.  Patient is currently on evenity monthly injections for management of osteoporosis-started 06/12/22. She has been tolerating evenity without any side effects.  She denies any recent falls or fractures.  She is using a cane to assist with ambulation.   Patient's daughter accompanied her to the appointment today.  Her daughter states she had a recent tooth extraction and will be following up next week to determine if she will require a bone graft.  She states she may require upcoming dental work in the future as well.      Activities of Daily Living:  Patient reports morning stiffness for 1 hour.   Patient Denies nocturnal pain.  Difficulty dressing/grooming: Reports Difficulty climbing stairs: Denies Difficulty getting out of chair: Denies Difficulty using hands for taps, buttons, cutlery, and/or writing: Denies  Review of Systems  Constitutional:  Negative for fatigue.  HENT:  Positive for mouth dryness. Negative for mouth sores.   Eyes:  Negative for dryness.  Respiratory:  Negative for shortness of breath.   Cardiovascular:  Negative for chest pain and palpitations.  Gastrointestinal:  Positive for constipation. Negative for blood in stool and diarrhea.  Endocrine: Positive for increased urination.  Genitourinary:  Positive for involuntary urination.  Musculoskeletal:  Positive for joint pain, gait problem, joint pain, joint swelling, muscle weakness and morning stiffness. Negative for myalgias, muscle tenderness and myalgias.  Skin:  Positive for sensitivity to sunlight. Negative for color  change, rash and hair loss.  Allergic/Immunologic: Negative for susceptible to infections.  Neurological:  Positive for dizziness and headaches.  Hematological:  Negative for swollen glands.  Psychiatric/Behavioral:  Negative for depressed mood and sleep disturbance. The patient is nervous/anxious.     PMFS History:  Patient Active Problem List   Diagnosis Date Noted   Age related osteoporosis 02/22/2023   Parkinsonism (HCC) 09/02/2022   Visual hallucination 09/02/2022   Gait abnormality 09/02/2022   Chronic low back pain without sciatica 08/11/2022   Callus of heel 08/07/2022   Pain and swelling of lower extremity 08/07/2022   Body mass index (BMI) 22.0-22.9, adult 08/07/2022   Drug therapy 08/07/2022   Migraine 01/06/2022   MCI (mild cognitive impairment) with memory loss 01/06/2022   Personal history of tobacco use, presenting hazards to health 01/06/2022   Encounter for long-term (current) aspirin use 01/06/2022   Urinary frequency 12/31/2021   Hip fracture requiring operative repair (HCC) 11/03/2021   Closed fracture of neck of left femur (HCC) 11/02/2021   Fall at home, initial encounter 11/02/2021   Normocytic anemia 11/02/2021   Tremor 01/06/2020   Age-related osteoporosis with current pathological fracture 11/05/2018   Grief reaction 03/31/2018   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   Chronic renal disease, stage III (HCC) 12/11/2017   Hypertension, essential, benign 10/30/2017   Anxiety 10/11/2017   Cognitive impairment 09/04/2016   Headache 04/27/2015   Hypertension 01/16/2013   Lower urinary tract infectious disease 01/16/2013    Past Medical History:  Diagnosis  Date   Anxiety    Cataracts, bilateral    Discoid lupus    Headache 04/27/2015   Left>right periorbital   Hypertension    Memory difficulties 09/04/2016    Family History  Problem Relation Age of Onset   Early death Mother    Cancer Father    Thyroid disease Daughter    Breast cancer  Daughter        67s   Glaucoma Daughter    Hypertension Daughter    Heart attack Son    Thyroid disease Son    Past Surgical History:  Procedure Laterality Date   ABDOMINAL HYSTERECTOMY     BREAST EXCISIONAL BIOPSY Left over 20 ys ago   benign   CHOLECYSTECTOMY     EXCISIONAL HEMORRHOIDECTOMY     TOTAL HIP ARTHROPLASTY Left 11/03/2021   Procedure: LEFT HEMI HIP ARTHROPLASTY ANTERIOR APPROACH;  Surgeon: Kathryne Hitch, MD;  Location: [facility] OR;  Service: Orthopedics;  Laterality: Left;   Social History   Social History Narrative   Live with husband,  4 children.    Caffeine: 1 cup daily    Immunization History  Administered Date(s) Administered   Fluad Quad(high Dose 65+) 09/25/2018   Influenza, High Dose Seasonal PF 10/02/2016, 10/14/2019   Influenza-Unspecified 10/22/2017, 10/01/2021   PFIZER(Purple Top)SARS-COV-2 Vaccination 02/09/2019, 03/01/2019, 12/09/2019, 06/20/2020   Pneumococcal Conjugate-13 11/28/2015   Pneumococcal Polysaccharide-23 12/04/2016   Pneumococcal-Unspecified 07/17/2022   Tdap 12/20/2020   Zoster Recombinant(Shingrix) 09/25/2018, 12/04/2018     Objective: Vital Signs: BP (!) 170/80 (BP Location: Left Arm, Patient Position: Sitting, Cuff Size: Normal)   Pulse 66   Resp 18   Ht 5\' 2"  (1.575 m)   Wt 116 lb (52.6 kg)   BMI 21.22 kg/m    Physical Exam Vitals and nursing note reviewed.  Constitutional:      Appearance: She is well-developed.  HENT:     Head: Normocephalic and atraumatic.  Eyes:     Conjunctiva/sclera: Conjunctivae normal.  Cardiovascular:     Rate and Rhythm: Normal rate and regular rhythm.     Heart sounds: Normal heart sounds.  Pulmonary:     Effort: Pulmonary effort is normal.     Breath sounds: Normal breath sounds.  Abdominal:     General: Bowel sounds are normal.     Palpations: Abdomen is soft.  Musculoskeletal:     Cervical back: Normal range of motion.  Lymphadenopathy:     Cervical: No cervical  adenopathy.  Skin:    General: Skin is warm and dry.     Capillary Refill: Capillary refill takes less than 2 seconds.  Neurological:     Mental Status: She is alert and oriented to person, place, and time.  Psychiatric:        Behavior: Behavior normal.      Musculoskeletal Exam: Patient remained seated during the examination today. C-spine has limited ROM with lateral rotation.  Thoracic kyphosis noted. Shoulder joints and elbow joints, wrist joints, MCPs, PIPs, DIPs have good range of motion with no synovitis.  Some thickening of MCP joints but no synovitis noted.  PIP and DIP thickening noted.  Knee joints have good range of motion no warmth or effusion.  Ankle joints have good range of motion with no tenderness or joint swelling. Using cane to assist with ambulation  CDAI Exam: CDAI Score: -- Patient Global: --; Provider Global: -- Swollen: --; Tender: -- Joint Exam 03/20/2023   No joint exam has been documented for this  visit   There is currently no information documented on the homunculus. Go to the Rheumatology activity and complete the homunculus joint exam.  Investigation: No additional findings.  Imaging: No results found.  Recent Labs: Lab Results  Component Value Date   WBC 3.7 02/11/2023   HGB 11.4 02/11/2023   PLT 201 02/11/2023   NA 140 02/11/2023   K 4.7 02/11/2023   CL 106 02/11/2023   CO2 19 (L) 02/11/2023   GLUCOSE 102 (H) 02/11/2023   BUN 24 02/11/2023   CREATININE 1.32 (H) 02/11/2023   BILITOT 0.3 02/11/2023   ALKPHOS 97 02/11/2023   AST 22 02/11/2023   ALT 10 02/11/2023   PROT 7.3 02/11/2023   ALBUMIN 4.1 02/11/2023   CALCIUM 9.3 02/11/2023   GFRAA 40 (L) 02/28/2020    Speciality Comments: No specialty comments available.  Procedures:  No procedures performed Allergies: Exelon [rivastigmine], Fish allergy, and Codeine   Assessment / Plan:     Visit Diagnoses: Age-related osteoporosis without current pathological fracture: Previous  DEXA 08/12/18: The BMD measured at Femur Total Right is 0.649 g/cm2 with a T-score of -2.8. Updated DEXA 02/28/22:BMD measured at Femur Total is 0.528 g/cm2 with a T-score of -3.8.   History of hysterectomy and estrogen deficiency.  History of recurrent falls.  Left hip fracture after a fall requiring hemiarthroplasty performed on 11/03/2021.  Using a cane to assist with ambulation and a walker at home.  Patient has completed physical therapy.  No recent falls. Vitamin D WNL-56 on 05/23/22.  She is taking a calcium vitamin D supplement every other day. Patient was initiated on Evenity monthly injections on 06/12/2022.  She has been tolerating Evenity without any side effects or injection site reactions.  She has not had any recent gaps in therapy.  Patient plans on completing the 1 year course of Evenity as recommended.   She is not due for a DEXA until February 2026--The patient and her daughter would like for her to have an updated bone density this summer and are willing to pay out-of-pocket within reason if insurance does not cover the bone density. Previous DEXA was ordered at the breast center-plan to reach out to Dr. [name] to place order since it is a conflict of interest for Dr. Corliss Skains and I to place order at that location. Once she has completed the 1-year course of Evenity the recommendation is for her to transition to Prolia 60 mg sq injections every 6 months.  Reviewed the indications, contraindications, and potential side effects of Prolia today in detail.  Patient was given informational handout about Prolia to review.  The patient and her daughter apprehensive to initiate Prolia given that she will require upcoming dental work.  According to the patient's daughter she recently had a tooth extraction without any delayed healing.  The patient and her daughter are aware of the risk of osteonecrosis of the jaw especially with extensive dental work.  The patient and her daughter plan to discuss the  use of prolia with her dentist more in detail prior to making a decision.  She will follow-up in the office in 6 months or sooner if she would like to initiate Prolia.  Closed fracture of neck of left femur with routine healing, subsequent encounter:  hemiarthroplasty performed on 11/03/21 by Dr. Magnus Ivan.  Using a cane to assist with ambulation.   Vitamin D deficiency -Vitamin D was within normal limits: 56 on 05/23/2022.  Future order for vitamin D will be placed today  plan: VITAMIN D 25 Hydroxy (Vit-D Deficiency, Fractures)  Stage 3b chronic kidney disease (HCC): Creatinine was 1.32 and GFR was 38 on 02/11/2023.  Other medical conditions are listed as follows:  Parenchymal renal hypertension, stage 1 through stage 4 or unspecified chronic kidney disease  Hypertension, essential, benign: Blood pressure is elevated today in the office: 170/80.  Patient was advised to monitor blood pressure closely to reach out to PCP if remains elevated.  MCI (mild cognitive impairment) with memory loss  Hypertensive nephropathy  Normocytic anemia  Former smoker    Orders: Orders Placed This Encounter  Procedures   VITAMIN D 25 Hydroxy (Vit-D Deficiency, Fractures)   No orders of the defined types were placed in this encounter.    Follow-Up Instructions: Return in about 6 months (around 09/17/2023).   Gearldine Bienenstock, PA-C  Note - This record has been created using Dragon software.  Chart creation errors have been sought, but may not always  have been located. Such creation errors do not reflect on  the standard of medical care.

## 2023-03-09 ENCOUNTER — Other Ambulatory Visit: Payer: Self-pay | Admitting: Internal Medicine

## 2023-03-09 DIAGNOSIS — I129 Hypertensive chronic kidney disease with stage 1 through stage 4 chronic kidney disease, or unspecified chronic kidney disease: Secondary | ICD-10-CM

## 2023-03-10 NOTE — Progress Notes (Unsigned)
No chief complaint on file.     ASSESSMENT AND PLAN  Whitney Henderson is a 89 y.o. 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   MEDICAL HISTORY:  Whitney Henderson is a 89 year old female, accompanied by her daughter, to follow-up for memory loss, gait abnormality, her primary care physician is Dr. [name], [facility]   I reviewed and summarized the referring note. PMHX HTN Hip replacement left in Oct 2023  She is a retired Chartered loss adjuster, was under the care of of Dr. Anne Hahn, later Maralyn Sago for mild cognitive impairment, previously tried Aricept for a while, but concern for weight loss, was stopped,  Since her left hip replacement in October 2023, daughter moved in with her, no longer have significant left hip pain, but noticed mild gait abnormality  Over the past couple years, she also developed right hand tremor, talking out of dreams, occasionally visual hallucinations, usually worse with her urinary tract infection,  She is sedentary most of the time, watch TV, denies bowel and bladder incontinence, has good appetite, sleeps okay,   Personally reviewed CT head October 2023, mild generalized atrophy  CT cervical spine mild degenerative changes no acute abnormality  Lab in 2024, normal PTH, calcium, B12, vitamin D, creatinine 1.34, CBC 10.8  Update March 11, 2023 SS:    PHYSICAL EXAM:   There were no vitals filed for this visit.    There is  no height or weight on file to calculate BMI.  PHYSICAL EXAMNIATION:  Gen: NAD, conversant, well nourised, well groomed                     Cardiovascular: Regular rate rhythm, no peripheral edema, warm, nontender. Eyes: Conjunctivae clear without exudates or hemorrhage Neck: Supple, no carotid bruits. Pulmonary: Clear to auscultation bilaterally   NEUROLOGICAL EXAM:  MENTAL STATUS: Speech/cognition: Masked face, soft voice    09/02/2022    9:48 AM 05/10/2021   10:48 AM 09/07/2020   10:51 AM  MMSE - Mini Mental State Exam  Orientation to time 3 4 4   Orientation to Place 5 5 5   Registration 3 3 3   Attention/ Calculation 0 0 3  Recall 0 1 0  Language- name 2 objects 2 2 2   Language- repeat 1 1 1   Language- follow 3 step command 3 3 3   Language- read & follow direction 1 1 1   Write a sentence 1 1 1   Copy design 0 1 0  Total score 19 22 23     CRANIAL NERVES: CN II: Visual fields are full to confrontation. Pupils are round equal and briskly reactive to light. CN III, IV, VI: extraocular movement are normal. No ptosis. CN V: Facial sensation is intact to light touch CN VII: Face is symmetric with normal eye closure  CN VIII: Hearing is normal to causal conversation. CN IX, X: Phonation is normal. CN XI: Head turning and shoulder shrug are intact  MOTOR: Right hand resting tremor, right more than left rigidity, bradykinesia  REFLEXES: Reflexes are 1 and symmetric at the biceps, triceps, knees, and ankles. Plantar responses are flexor.  SENSORY: Intact to light touch, pinprick and vibratory sensation are intact in fingers and toes.  COORDINATION: There is no trunk or limb dysmetria noted.  GAIT/STANCE: Need push-up to get up from seated position, scoliosis, cautious, decreased stride, decreased right arm swing,  REVIEW OF SYSTEMS:  Full 14 system review of systems performed and notable only for as above All other review of systems were  negative.   ALLERGIES: Allergies  Allergen Reactions   Exelon [Rivastigmine] Itching and Rash   Fish Allergy Other (See Comments)    Does not do well with Seafood.   Codeine Rash    HOME MEDICATIONS: Current Outpatient Medications  Medication Sig Dispense Refill   acetaminophen (TYLENOL) 500 MG tablet Take 500 mg by mouth every 6 (six) hours as needed for mild pain or moderate pain.     amLODipine (NORVASC) 2.5 MG tablet TAKE 1 TABLET(2.5 MG) BY MOUTH TWICE DAILY WITH BREAKFAST AND DINNER 180 tablet 1   Apoaequorin (PREVAGEN PO) Take 1 capsule by mouth every other day.     aspirin 81 MG chewable tablet Chew 1 tablet (81 mg total) by mouth 2 (two) times daily. (Patient not taking: Reported on 02/11/2023) 30 tablet 0   betamethasone dipropionate (DIPROLENE) 0.05 % cream APPLY THIN LAYER EXTERNALLY TO THE AFFECTED AREA EVERY DAY AS NEEDED (Patient taking differently: 1 application  daily as needed (skin irritation).) 60 g 2   Calcium Carb-Cholecalciferol (CALCIUM 500 +D PO) Take 1 tablet by mouth every other day.     carbidopa-levodopa (SINEMET IR) 25-100 MG tablet Take 1 tablet by mouth 3 (three) times daily. 90 tablet 11   cephALEXin (KEFLEX) 500 MG capsule Keflex 500mg  every 12 hours 14 capsule 0   Multiple Vitamins-Minerals (CENTRUM SILVER PO) Take 1 tablet by mouth every other day.     Propylene Glycol (SYSTANE BALANCE OP) Place 1 drop into both eyes daily as needed (for itchy eyes). (Patient not taking: Reported on 02/11/2023)     sertraline (ZOLOFT) 25 MG tablet TAKE 1 TABLET(25 MG) BY MOUTH DAILY 90 tablet 1   thiamine 100 MG tablet Take 100 mg by mouth daily.     triamcinolone cream (KENALOG) 0.1 % APPLY TO AFFECTED AREA TWICE DAILY AS NEEDED (Patient taking differently: Apply 1 Application topically 2 (two) times daily as needed (for flares).) 45 g 0   vitamin C (ASCORBIC ACID) 500 MG tablet Take 500 mg by mouth every other day.     No current facility-administered medications for  this visit.   Facility-Administered Medications Ordered in Other Visits  Medication Dose Route Frequency Provider Last Rate Last Admin   Romosozumab-aqqg (EVENITY) 105 MG/1. injection 210 mg  210 mg Subcutaneous Once Gearldine Bienenstock, PA-C        PAST MEDICAL HISTORY: Past Medical History:  Diagnosis Date   Anxiety    Cataracts, bilateral    Discoid lupus    Headache 04/27/2015   Left>right periorbital   Hypertension    Memory difficulties 09/04/2016    PAST SURGICAL HISTORY: Past Surgical History:  Procedure Laterality Date   ABDOMINAL HYSTERECTOMY     BREAST EXCISIONAL BIOPSY Left over 20 ys ago   benign   CHOLECYSTECTOMY     EXCISIONAL HEMORRHOIDECTOMY     TOTAL HIP ARTHROPLASTY Left 11/03/2021   Procedure: LEFT HEMI HIP ARTHROPLASTY ANTERIOR APPROACH;  Surgeon: Kathryne Hitch, MD;  Location: [facility]  OR;  Service: Orthopedics;  Laterality: Left;    FAMILY HISTORY: Family History  Problem Relation Age of Onset   Early death Mother    Cancer Father    Thyroid disease Daughter    Breast cancer Daughter        39s   Glaucoma Daughter    Hypertension Daughter    Heart attack Son    Thyroid disease Son     SOCIAL HISTORY: Social History   Socioeconomic History   Marital status: Widowed    Spouse name: Ivar Drape    Number of children: 4   Years of education: College- Masters   [employer] education level: Not on file  Occupational History   Not on file  Tobacco Use   Smoking status: Former    Types: Cigarettes    Passive exposure: Never   Smokeless tobacco: Never   Tobacco comments:    smoked sometimes not daily  Vaping Use   Vaping status: Never Used  Substance and Sexual Activity   Alcohol use: No   Drug use: No   Sexual activity: Not Currently  Other Topics Concern   Not on file  Social History Narrative   Live with husband,  4 children.    Caffeine: 1 cup daily    Social Drivers of Health   Financial Resource Strain: Low Risk  (08/07/2022)    Overall Financial Resource Strain (CARDIA)    Difficulty of Paying Living Expenses: Not hard at all  Food Insecurity: No Food Insecurity (03/03/2023)   Hunger Vital Sign    Worried About Running Out of Food in the Last Year: Never true    Ran Out of Food in the Last Year: Never true  Transportation Needs: No Transportation Needs (03/03/2023)   PRAPARE - Administrator, Civil Service (Medical): No    Lack of Transportation (Non-Medical): No  Physical Activity: Sufficiently Active (08/07/2022)   Exercise Vital Sign    Days of Exercise per Week: 7 days    Minutes of Exercise per Session: 30 min  Stress: Stress Concern Present (08/07/2022)   [employer] of Occupational Health - Occupational Stress Questionnaire    Feeling of Stress : To some extent  Social Connections: Socially Isolated (08/07/2022)   Social Connection and Isolation Panel [NHANES]    Frequency of Communication with Friends and Family: Twice a week    Frequency of Social Gatherings with Friends and Family: Never    Attends Religious Services: More than 4 times per year    Active Member of [employer] or Organizations: No    Attends Banker Meetings: Never    Marital Status: Widowed  Intimate Partner Violence: Not At Risk (03/03/2023)   Humiliation, Afraid, Rape, and Kick questionnaire    Fear of Current or Ex-Partner: No    Emotionally Abused: No    Physically Abused: No    Sexually Abused: No   Margie Ege, Edrick Oh, DNP  [facility] [address], [address] [place], [place] [address] [phone]

## 2023-03-11 ENCOUNTER — Telehealth: Payer: Self-pay | Admitting: Neurology

## 2023-03-11 ENCOUNTER — Ambulatory Visit: Payer: Medicare Other | Admitting: Neurology

## 2023-03-11 ENCOUNTER — Encounter: Payer: Self-pay | Admitting: Neurology

## 2023-03-11 VITALS — BP 135/70 | HR 65 | Ht 62.0 in | Wt 115.0 lb

## 2023-03-11 DIAGNOSIS — G20C Parkinsonism, unspecified: Secondary | ICD-10-CM

## 2023-03-11 DIAGNOSIS — G3184 Mild cognitive impairment, so stated: Secondary | ICD-10-CM | POA: Diagnosis not present

## 2023-03-11 MED ORDER — CARBIDOPA-LEVODOPA 25-100 MG PO TABS: 1.0000 | ORAL_TABLET | Freq: Three times a day (TID) | ORAL | 1 refills | Status: DC

## 2023-03-11 NOTE — Telephone Encounter (Signed)
Pt's daughter called to verify appointment

## 2023-03-11 NOTE — Patient Instructions (Signed)
Great to see you today.  We will continue Sinemet 25/100 mg 1 tablet 3 times daily.  Recommend continue exercise, brain stimulating activity.  Keep close follow-up with primary care doctor for management of vascular risk factors.  Follow-up in 6 months or sooner if needed.  Thanks

## 2023-03-13 ENCOUNTER — Ambulatory Visit: Payer: Self-pay

## 2023-03-13 NOTE — Patient Instructions (Signed)
Visit Information  Thank you for taking time to visit with me today. Please don't hesitate to contact me if I can be of assistance to you.   Following are the goals we discussed today:   Goals Addressed             This Visit's Progress    COMPLETED: To check urine for UTI       Care Coordination Interventions: Evaluation of current treatment plan related to UTI and patient's adherence to plan as established by provider Reviewed and discussed with patient and daughter, recent urine culture show positive for UTI Determined patient completed her full course of antibiotics, daughter reports increased urine output has subsided Instructed daughter to report any/all symptoms suggestive of reoccurring UTI to patient's PCP promptly and she verbalizes understanding      To improve ankle edema following EVENITY injection   On track    Care Coordination Interventions: Evaluation of current treatment plan related to Osteoporosis and patient's adherence to plan as established by provider Reviewed and discussed with daughter [name], that patient has 2 remaining EVENITY injections remaining and she has tolerated the monthly injections well  Discussed patient had tooth extraction and dentist warned EVENITY may delay healing, however daughter reports patient's gum has healed without delay or complications Reviewed and discussed next upcoming scheduled visit for EVENITY injection due for February  Updated medication profile  Discussed plans with patient for ongoing care coordination follow up and provided patient with direct contact information for nurse care coordinator Patient will keep her scheduled appointment set for 03/17/23 @1 :15 PM at [facility]       To optimize treatment for Parkinson's disease   On track    Care Coordination Interventions: Evaluation of current treatment plan related to Parkinson's disease  and patient's adherence to plan as established by provider Reviewed  and discussed recent Neuro follow up with Margie Ege NP; Review of patient status, including review of consultant's reports, relevant laboratory and other test results, and medications completed  Continue Sinemet 25/100 mg 1 tablet 3 times daily.   Recommend continue exercise, brain stimulating activity.   Keep close follow-up with primary care doctor for management of vascular risk factors.   Follow-up in 6 months or sooner if needed.   Re-educated patient/daughter about using fall precautions, including using current walker/cane for fall prevention  Assessed for DME needs, patient would like Rx for a Rollator, sent request to PCP via in basket message Discussed plans with patient for ongoing care coordination follow up and provided patient with direct contact information for nurse care coordinator Patient will continue to use precautions as discussed Patient will continue to take her medications exactly as prescribed Patient will report any/all falls to her doctor and or seek medical attention Patient will continue to work with nurse case manager with next scheduled call set for 03/26/23 @2 :30 PM          Our next appointment is by telephone on 03/26/23 at 2:30 PM  Please call the care guide team at [phone] if you need to cancel or reschedule your appointment.   If you are experiencing a Mental Health or Behavioral Health Crisis or need someone to talk to, please call 1-800-273-TALK (toll free, 24 hour hotline)  The patient verbalized understanding of instructions, educational materials, and care plan provided today and DECLINED offer to receive copy of patient instructions, educational materials, and care plan.   Raye Sorrow CCM [employer] [facility], [facility] Nurse Care Coordinator  Direct Dial: [phone] Website: [email or web address]

## 2023-03-13 NOTE — Patient Outreach (Signed)
Care Coordination   Follow Up Visit Note   03/13/2023 Name: Whitney Henderson MRN: [id number] DOB: 01-03-1929  Duffy Bruce Tomey is a 89 y.o. year old female who sees [name], MD for primary care. I spoke with  [name] by phone today.  What matters to the patients health and wellness today?  Patient would like to start using a Rollator to help with mobility and fall prevention.     Goals Addressed             This Visit's Progress    COMPLETED: To check urine for UTI       Care Coordination Interventions: Evaluation of current treatment plan related to UTI and patient's adherence to plan as established by provider Reviewed and discussed with patient and daughter, recent urine culture show positive for UTI Determined patient completed her full course of antibiotics, daughter reports increased urine output has subsided Instructed daughter to report any/all symptoms suggestive of reoccurring UTI to patient's PCP promptly and she verbalizes understanding      To improve ankle edema following EVENITY injection   On track    Care Coordination Interventions: Evaluation of current treatment plan related to Osteoporosis and patient's adherence to plan as established by provider Reviewed and discussed with daughter [name], that patient has 2 remaining EVENITY injections remaining and she has tolerated the monthly injections well  Discussed patient had tooth extraction and dentist warned EVENITY may delay healing, however daughter reports patient's gum has healed without delay or complications Reviewed and discussed next upcoming scheduled visit for EVENITY injection due for February  Updated medication profile  Discussed plans with patient for ongoing care coordination follow up and provided patient with direct contact information for nurse care coordinator Patient will keep her scheduled appointment set for 03/17/23 @1 :15 PM at [facility]       To optimize treatment  for Parkinson's disease   On track    Care Coordination Interventions: Evaluation of current treatment plan related to Parkinson's disease  and patient's adherence to plan as established by provider Reviewed and discussed recent Neuro follow up with Margie Ege NP; Review of patient status, including review of consultant's reports, relevant laboratory and other test results, and medications completed  Continue Sinemet 25/100 mg 1 tablet 3 times daily.   Recommend continue exercise, brain stimulating activity.   Keep close follow-up with primary care doctor for management of vascular risk factors.   Follow-up in 6 months or sooner if needed.   Re-educated patient/daughter about using fall precautions, including using current walker/cane for fall prevention  Assessed for DME needs, patient would like Rx for a Rollator, sent request to PCP via in basket message Discussed plans with patient for ongoing care coordination follow up and provided patient with direct contact information for nurse care coordinator Patient will continue to use precautions as discussed Patient will continue to take her medications exactly as prescribed Patient will report any/all falls to her doctor and or seek medical attention Patient will continue to work with nurse case manager with next scheduled call set for 03/26/23 @2 :30 PM      Interventions Today    Flowsheet Row Most Recent Value  Chronic Disease   Chronic disease during today's visit Other  [Parkinson's disease,  s/p UTI,  falls]  General Interventions   General Interventions Discussed/Reviewed General Interventions Discussed, General Interventions Reviewed, Durable Medical Equipment (DME), Doctor Visits, Labs, Communication with  Doctor Visits Discussed/Reviewed Doctor Visits Discussed,  Doctor Visits Reviewed, Specialist, PCP  Durable Medical Equipment (DME) Dan Humphreys  Communication with PCP/Specialists  [Dr. Sanders]  Exercise Interventions   Exercise  Discussed/Reviewed Physical Activity, Exercise Reviewed, Exercise Discussed, Assistive device use and maintanence  Physical Activity Discussed/Reviewed Physical Activity Discussed, Physical Activity Reviewed  Education Interventions   Education Provided Provided Education  Provided Verbal Education On Exercise, Medication, When to see the doctor, Nutrition  Nutrition Interventions   Nutrition Discussed/Reviewed Nutrition Discussed, Nutrition Reviewed, Fluid intake  Pharmacy Interventions   Pharmacy Dicussed/Reviewed Pharmacy Topics Discussed, Pharmacy Topics Reviewed, Medications and their functions  Safety Interventions   Safety Discussed/Reviewed Home Safety, Fall Risk, Safety Reviewed, Safety Discussed  Home Safety Assistive Devices          SDOH assessments and interventions completed:  No     Care Coordination Interventions:  Yes, provided   Follow up plan: Follow up call scheduled for 03/26/23 @2 :30 PM    Encounter Outcome:  Patient Visit Completed

## 2023-03-17 ENCOUNTER — Ambulatory Visit (INDEPENDENT_AMBULATORY_CARE_PROVIDER_SITE_OTHER): Payer: Medicare Other

## 2023-03-17 VITALS — BP 156/79 | HR 67 | Temp 98.0°F | Resp 16 | Ht 62.0 in | Wt 117.4 lb

## 2023-03-17 DIAGNOSIS — S72002D Fracture of unspecified part of neck of left femur, subsequent encounter for closed fracture with routine healing: Secondary | ICD-10-CM

## 2023-03-17 DIAGNOSIS — M8000XA Age-related osteoporosis with current pathological fracture, unspecified site, initial encounter for fracture: Secondary | ICD-10-CM | POA: Diagnosis not present

## 2023-03-17 MED ORDER — ROMOSOZUMAB-AQQG 105 MG/1.17ML ~~LOC~~ SOSY
210.0000 mg | PREFILLED_SYRINGE | Freq: Once | SUBCUTANEOUS | Status: AC
  Administered 2023-03-17: 210 mg via SUBCUTANEOUS

## 2023-03-17 NOTE — Progress Notes (Signed)
 Diagnosis: Osteoporosis  Provider:  Chilton Greathouse MD  Procedure: Injection  Evenity (Romosozumab-aqqg), Dose: 210 mg, Site: subcutaneous, Number of injections: 2  Injection Site(s): Left lower quad. abdomen and Right lower quad. abdomne  Post Care: Patient declined observation  Discharge: Condition: Good, Destination: Home . AVS Provided  Performed by:  Wyvonne Lenz, RN

## 2023-03-20 ENCOUNTER — Ambulatory Visit: Payer: Medicare Other | Attending: Physician Assistant | Admitting: Physician Assistant

## 2023-03-20 ENCOUNTER — Encounter: Payer: Self-pay | Admitting: Physician Assistant

## 2023-03-20 VITALS — BP 170/80 | HR 66 | Resp 18 | Ht 62.0 in | Wt 116.0 lb

## 2023-03-20 DIAGNOSIS — N1832 Chronic kidney disease, stage 3b: Secondary | ICD-10-CM

## 2023-03-20 DIAGNOSIS — I129 Hypertensive chronic kidney disease with stage 1 through stage 4 chronic kidney disease, or unspecified chronic kidney disease: Secondary | ICD-10-CM

## 2023-03-20 DIAGNOSIS — E559 Vitamin D deficiency, unspecified: Secondary | ICD-10-CM | POA: Diagnosis not present

## 2023-03-20 DIAGNOSIS — S72002D Fracture of unspecified part of neck of left femur, subsequent encounter for closed fracture with routine healing: Secondary | ICD-10-CM | POA: Diagnosis not present

## 2023-03-20 DIAGNOSIS — M810 Age-related osteoporosis without current pathological fracture: Secondary | ICD-10-CM | POA: Diagnosis not present

## 2023-03-20 DIAGNOSIS — D649 Anemia, unspecified: Secondary | ICD-10-CM

## 2023-03-20 DIAGNOSIS — Z87891 Personal history of nicotine dependence: Secondary | ICD-10-CM | POA: Diagnosis not present

## 2023-03-20 DIAGNOSIS — I10 Essential (primary) hypertension: Secondary | ICD-10-CM | POA: Diagnosis not present

## 2023-03-20 DIAGNOSIS — G3184 Mild cognitive impairment, so stated: Secondary | ICD-10-CM | POA: Diagnosis not present

## 2023-03-20 NOTE — Progress Notes (Signed)
 Pharmacy Note  Subjective:  Patient presents today to [facility] for follow up office visit.   Accompanied by her daughter today. Patient was seen by the pharmacist for counseling on Prolia. She is completing Evenity treatment (has two more doses remaining). She will be completing additional dental work.  Objective: CMP     Component Value Date/Time   NA 140 02/11/2023 1129   K 4.7 02/11/2023 1129   CL 106 02/11/2023 1129   CO2 19 (L) 02/11/2023 1129   GLUCOSE 102 (H) 02/11/2023 1129   GLUCOSE 81 05/23/2022 1158   BUN 24 02/11/2023 1129   CREATININE 1.32 (H) 02/11/2023 1129   CREATININE 1.34 (H) 05/23/2022 1158   CALCIUM 9.3 02/11/2023 1129   PROT 7.3 02/11/2023 1129   ALBUMIN 4.1 02/11/2023 1129   AST 22 02/11/2023 1129   ALT 10 02/11/2023 1129   ALKPHOS 97 02/11/2023 1129   BILITOT 0.3 02/11/2023 1129   GFRNONAA 43 (L) 11/06/2021 0306   GFRAA 40 (L) 02/28/2020 1648    Vitamin D Lab Results  Component Value Date   VD25OH 56 05/23/2022    DEXA scan, DEXA scan ordered to evaluate the results of treatment in comparison to prior values.  Assessment/Plan:  Counseled patient on purpose, proper use, and adverse effects of Prolia.  Counseled patient that Prolia is a medication that must be injected every 6 months by a healthcare professional.  Advised patient to take calcium 1200 mg daily and vitamin D 800 units daily.  Reviewed the most common adverse effects of Prolia including risk of infection, osteonecrosis of the jaw, rash, and muscle/bone pain.  Patient confirms she does not have any major dental work planned at this time.  Reviewed with patient the signs/symptoms of low calcium and advised patient to alert [place] if she experiences these symptoms.  Provided patient with medication education material and answered all questions.  Patient will complete dental work prior to starting Prolia. Patient will also possibly repeating DEXA pending insurance cost.   DEXA will  be ordered by PCP, Dr. Gala Lewandowsky, PharmD, MPH, BCPS, CPP Clinical Pharmacist (Rheumatology and Pulmonology)

## 2023-03-26 ENCOUNTER — Ambulatory Visit: Payer: Self-pay

## 2023-03-26 NOTE — Patient Outreach (Signed)
 Care Coordination   Follow Up Visit Note   03/26/2023 Name: Whitney Henderson MRN: [id number] DOB: 1928-05-25  Whitney Henderson is a 89 y.o. year old female who sees Whitney Peng, MD for primary care. I spoke with Whitney Henderson and daughter Whitney Henderson by phone today.  What matters to the patients health and wellness today?  Patient would like to receive her rollator to help with balance and mobility.     Goals Addressed             This Visit's Progress    To improve ankle edema following EVENITY injection   On track    Care Coordination Interventions: Evaluation of current treatment plan related to Osteoporosis and patient's adherence to plan as established by provider Discussed patient completed her Rheumatology follow up visit, reviewed and discussed the Assessment/Plan  Advised patient to take calcium 1200 mg daily and vitamin D 800 units daily.  Once she has completed the 1-year course of Evenity the recommendation is for her to transition to Prolia 60 mg sq injections every 6 months.   Reviewed the indications, contraindications, and potential side effects of Prolia today in detail.   Patient was given informational handout about Prolia to review.   The patient and her daughter apprehensive to initiate Prolia given that she will require upcoming dental work.   According to the patient's daughter she recently had a tooth extraction without any delayed healing.   The patient and her daughter are aware of the risk of osteonecrosis of the jaw especially with extensive dental work.   The patient and her daughter plan to discuss the use of prolia with her dentist more in detail prior to making a decision.   She will follow-up in the office in 6 months or sooner if she would like to initiate Prolia.  Determined daughter Whitney Henderson verbalizes understanding of patient's prescribed treatment plan  Discussed plans with patient for ongoing care coordination follow up and provided patient with  direct contact information for nurse care coordinator Scheduled next upcoming nurse call for 05/25/21 @2 :30 PM     To optimize treatment for Parkinson's disease   On track    Care Coordination Interventions: Evaluation of current treatment plan related to Parkinson's disease  and patient's adherence to plan as established by provider Completed call with daughter Whitney Henderson and patient, confirmed patient's rollator will be delivered to her home this week per [employer]  Discussed plans with patient for ongoing care coordination follow up and provided patient with direct contact information for nurse care coordinator Scheduled next upcoming nurse call for 05/25/21 @2 :30 PM    Interventions Today    Flowsheet Row Most Recent Value  Chronic Disease   Chronic disease during today's visit Other  [impaired physical mobility,  Osteoporosis]  General Interventions   General Interventions Discussed/Reviewed General Interventions Discussed, General Interventions Reviewed, Labs, Doctor Visits, Durable Medical Equipment (DME)  Doctor Visits Discussed/Reviewed Doctor Visits Reviewed, Doctor Visits Discussed, Specialist, PCP  Durable Medical Equipment (DME) Walker  Exercise Interventions   Exercise Discussed/Reviewed Physical Activity, Assistive device use and maintanence  Physical Activity Discussed/Reviewed Physical Activity Reviewed, Physical Activity Discussed  Education Interventions   Education Provided Provided Education  Provided Verbal Education On Medication, When to see the doctor, Labs  Pharmacy Interventions   Pharmacy Dicussed/Reviewed Pharmacy Topics Discussed, Pharmacy Topics Reviewed, Medications and their functions  Safety Interventions   Safety Discussed/Reviewed Home Safety, Fall Risk, Safety Reviewed, Safety Discussed  Home Safety Assistive  Devices          SDOH assessments and interventions completed:  No     Care Coordination Interventions:  Yes, provided   Follow up plan:  Follow up call scheduled for 05/26/23 @2 :30 PM    Encounter Outcome:  Patient Visit Completed

## 2023-03-26 NOTE — Patient Instructions (Signed)
 Visit Information  Thank you for taking time to visit with me today. Please don't hesitate to contact me if I can be of assistance to you.   Following are the goals we discussed today:   Goals Addressed             This Visit's Progress    To improve ankle edema following EVENITY injection   On track    Care Coordination Interventions: Evaluation of current treatment plan related to Osteoporosis and patient's adherence to plan as established by provider Discussed patient completed her Rheumatology follow up visit, reviewed and discussed the Assessment/Plan  Advised patient to take calcium 1200 mg daily and vitamin D 800 units daily.  Once she has completed the 1-year course of Evenity the recommendation is for her to transition to Prolia 60 mg sq injections every 6 months.   Reviewed the indications, contraindications, and potential side effects of Prolia today in detail.   Patient was given informational handout about Prolia to review.   The patient and her daughter apprehensive to initiate Prolia given that she will require upcoming dental work.   According to the patient's daughter she recently had a tooth extraction without any delayed healing.   The patient and her daughter are aware of the risk of osteonecrosis of the jaw especially with extensive dental work.   The patient and her daughter plan to discuss the use of prolia with her dentist more in detail prior to making a decision.   She will follow-up in the office in 6 months or sooner if she would like to initiate Prolia.  Determined daughter [name] verbalizes understanding of patient's prescribed treatment plan  Discussed plans with patient for ongoing care coordination follow up and provided patient with direct contact information for nurse care coordinator Scheduled next upcoming nurse call for 05/25/21 @2 :30 PM     To optimize treatment for Parkinson's disease   On track    Care Coordination Interventions: Evaluation of  current treatment plan related to Parkinson's disease  and patient's adherence to plan as established by provider Completed call with daughter [name] and patient, confirmed patient's rollator will be delivered to her home this week per [employer]  Discussed plans with patient for ongoing care coordination follow up and provided patient with direct contact information for nurse care coordinator Scheduled next upcoming nurse call for 05/25/21 @2 :30 PM        Our next appointment is by telephone on 05/26/23 at 2:30 PM  Please call the care guide team at [phone] if you need to cancel or reschedule your appointment.   If you are experiencing a Mental Health or Behavioral Health Crisis or need someone to talk to, please call 1-800-273-TALK (toll free, 24 hour hotline)  The patient verbalized understanding of instructions, educational materials, and care plan provided today and DECLINED offer to receive copy of patient instructions, educational materials, and care plan.   Delsa Sale RN BSN CCM [facility], [facility] Health Nurse Care Coordinator  Direct Dial: [phone] Website: [email or web address]

## 2023-03-27 ENCOUNTER — Encounter: Payer: Medicare Other | Admitting: Licensed Clinical Social Worker

## 2023-04-07 ENCOUNTER — Ambulatory Visit: Payer: Self-pay | Admitting: Licensed Clinical Social Worker

## 2023-04-07 NOTE — Patient Instructions (Signed)
 Visit Information  Thank you for taking time to visit with me today. Please don't hesitate to contact me if I can be of assistance to you.   Following are the goals we discussed today:   Goals Addressed             This Visit's Progress    Care coordination Activities   On track    Care Coordination Interventions: Patient and daughter were on the call today and wanted to know more information on LTC steps for placement.SW provided the informaiton.  Family is not ready at this time for placement,it will be something for later in the year.  SW will gather some resources to mail to the patient about the steps for long term care and explained that when ready the family can also apply for LTC Medicaid.  Sw provided the family the number to adult placement at DSS for further questions  SW went over all the SDOH Questions and there were no other needs at this time.  SW will follow back up with the patient and her daughter on 04/21/2023 at 3:00 pm, this is the time requested by the patient.         Our next appointment is by telephone on 04/21/2023 at 3:00 pm  Please call the care guide team at [phone] if you need to cancel or reschedule your appointment.   If you are experiencing a Mental Health or Behavioral Health Crisis or need someone to talk to, please call the Suicide and Crisis Lifeline: 988 go to [facility] [address], [place] [phone]) call 911  The patient verbalized understanding of instructions, educational materials, and care plan provided today and DECLINED offer to receive copy of patient instructions, educational materials, and care plan.   [name], PhD [facility], [facility] Social Worker Direct Dial: [phone]  Fax: [phone]

## 2023-04-07 NOTE — Patient Outreach (Signed)
 Care Coordination   Follow Up Visit Note   04/07/2023 Name: Whitney Henderson MRN: [id number] DOB: [birth year]  Whitney Henderson is a 89 y.o. year old female who sees [name], MD for primary care. I spoke with  Whitney Henderson and daughter Whitney Henderson by phone today.  What matters to the patients health and wellness today?  LTC steps     Goals Addressed             This Visit's Progress    Care coordination Activities   On track    Care Coordination Interventions: Patient and daughter were on the call today and wanted to know more information on LTC steps for placement.SW provided the informaiton.  Family is not ready at this time for placement,it will be something for later in the year.  SW will gather some resources to mail to the patient about the steps for long term care and explained that when ready the family can also apply for LTC Medicaid.  Sw provided the family the number to adult placement at DSS for further questions  SW went over all the SDOH Questions and there were no other needs at this time.  SW will follow back up with the patient and her daughter on 04/21/2023 at 3:00 pm, this is the time requested by the patient.         SDOH assessments and interventions completed:  Yes  SDOH Interventions Today    Flowsheet Row Most Recent Value  SDOH Interventions   Food Insecurity Interventions Intervention Not Indicated  Housing Interventions Intervention Not Indicated  Transportation Interventions Intervention Not Indicated  Utilities Interventions Intervention Not Indicated        Care Coordination Interventions:  Yes, provided  Interventions Today    Flowsheet Row Most Recent Value  General Interventions   General Interventions Discussed/Reviewed General Interventions Reviewed, Community Resources  [SW will mail out LTC list]        Follow up plan: Follow up call scheduled for 04/21/2023 at 3:00 pm    Encounter Outcome:  Patient Visit Completed   [name], PhD [facility], [facility] Social Worker Direct Dial: [phone]  Fax: [phone]

## 2023-04-14 ENCOUNTER — Ambulatory Visit (INDEPENDENT_AMBULATORY_CARE_PROVIDER_SITE_OTHER): Payer: Medicare Other

## 2023-04-14 VITALS — BP 152/79 | HR 61 | Temp 98.0°F | Resp 16 | Ht 61.0 in | Wt 114.2 lb

## 2023-04-14 DIAGNOSIS — M8000XA Age-related osteoporosis with current pathological fracture, unspecified site, initial encounter for fracture: Secondary | ICD-10-CM

## 2023-04-14 DIAGNOSIS — S72002D Fracture of unspecified part of neck of left femur, subsequent encounter for closed fracture with routine healing: Secondary | ICD-10-CM

## 2023-04-14 MED ORDER — ROMOSOZUMAB-AQQG 105 MG/1.17ML ~~LOC~~ SOSY
210.0000 mg | PREFILLED_SYRINGE | Freq: Once | SUBCUTANEOUS | Status: AC
  Administered 2023-04-14: 210 mg via SUBCUTANEOUS

## 2023-04-14 NOTE — Progress Notes (Signed)
 Diagnosis: Osteoporosis  Provider:  Chilton Greathouse MD  Procedure: Injection  Evenity (Romosozumab-aqqg), Dose: 210 mg, Site: subcutaneous, Number of injections: 2  Injection Site(s): Left lower quad. Abdomen, Right lower quad, abdomen  Post Care: Patient declined observation  Discharge: Condition: Good, Destination: Home . AVS Provided  Performed by:  Marilynn Rail, RN

## 2023-04-16 ENCOUNTER — Encounter: Payer: Self-pay | Admitting: Physician Assistant

## 2023-04-16 ENCOUNTER — Other Ambulatory Visit: Payer: Self-pay

## 2023-04-16 ENCOUNTER — Telehealth: Payer: Self-pay

## 2023-04-16 MED ORDER — SERTRALINE HCL 50 MG PO TABS
50.0000 mg | ORAL_TABLET | Freq: Every day | ORAL | 1 refills | Status: DC
Start: 2023-04-16 — End: 2023-09-02

## 2023-04-16 NOTE — Telephone Encounter (Signed)
 I called patient's daughter and left her a vm to call the office back. She is advised to have the patient take sertraline 25mg  2 tablets at the same time by mouth once daily until she runs out then she can pick up her prescription of sertraline 50mg  and take tab once daily. She is to follow up with Dr.Sanders in 4 weeks. Appointment has been scheduled we just need to verify that the date and time is okay with the patient and patient's daughter. YL,RMA

## 2023-04-21 ENCOUNTER — Ambulatory Visit: Payer: Self-pay | Admitting: Licensed Clinical Social Worker

## 2023-04-21 NOTE — Patient Instructions (Signed)
 Visit Information  Thank you for taking time to visit with me today. Please don't hesitate to contact me if I can be of assistance to you.   Following are the goals we discussed today:   Goals Addressed             This Visit's Progress    Care coordination Activities   On track    Care Coordination Interventions: Patient and daughter were on the call today and wanted to know more information on LTC steps for placement.SW provided the informaiton.  Family is not ready at this time for placement,it will be something for later in the year.  SW will gather some resources to mail to the patient about the steps for long term care and explained that when ready the family can also apply for LTC Medicaid.  Sw provided the family the number to adult placement at DSS for further questions  SW went over all the SDOH Questions and there were no other needs at this time.  SW sent a referral to the Medicaid walk in clinic  SW will follow back up with the patient and her daughter on 05/05/2023 at 3:00 pm, this is the time requested by the patient.         Our next appointment is by telephone on 05/05/23 at 3:00 pm  Please call the care guide team at [phone] if you need to cancel or reschedule your appointment.   If you are experiencing a Mental Health or Behavioral Health Crisis or need someone to talk to, please call the Suicide and Crisis Lifeline: 988 go to [address] [facility] [address], [place] [phone]) call 911  The patient verbalized understanding of instructions, educational materials, and care plan provided today and DECLINED offer to receive copy of patient instructions, educational materials, and care plan.   [name], PhD [facility], [facility] Social Worker Direct Dial: [phone]  Fax: [phone]

## 2023-04-21 NOTE — Patient Outreach (Signed)
 Care Coordination   Follow Up Visit Note   04/21/2023 Name: Whitney Henderson MRN: [id number] DOB: 01/03/1929  Whitney Henderson is a 89 y.o. year old female who sees Whitney Peng, MD for primary care. I spoke with  Whitney Henderson by phone today.  What matters to the patients health and wellness today?  Medicaid for LTC    Goals Addressed             This Visit's Progress    Care coordination Activities   On track    Care Coordination Interventions: Patient and daughter were on the call today and wanted to know more information on LTC steps for placement.SW provided the informaiton.  Family is not ready at this time for placement,it will be something for later in the year.  SW will gather some resources to mail to the patient about the steps for long term care and explained that when ready the family can also apply for LTC Medicaid.  Sw provided the family the number to adult placement at DSS for further questions  SW went over all the SDOH Questions and there were no other needs at this time.  SW sent a referral to the Medicaid walk in clinic  SW will follow back up with the patient and her daughter on 05/05/2023 at 3:00 pm, this is the time requested by the patient.         SDOH assessments and interventions completed:  Yes  SDOH Interventions Today    Flowsheet Row Most Recent Value  SDOH Interventions   Food Insecurity Interventions Intervention Not Indicated  Housing Interventions Intervention Not Indicated  Transportation Interventions Intervention Not Indicated  Utilities Interventions Intervention Not Indicated        Care Coordination Interventions:  Yes, provided Whitney Cooks, PhD [facility], [facility] Social Worker Direct Dial: [phone]  Fax: [phone]   Follow up plan: Follow up call scheduled for 05/05/2023 at 3:00 pm    Encounter Outcome:  Patient Visit Completed   Whitney Cooks, PhD [facility], [facility] Social Worker Direct Dial: [phone]  Fax: [phone]

## 2023-05-05 ENCOUNTER — Ambulatory Visit: Payer: Self-pay | Admitting: Licensed Clinical Social Worker

## 2023-05-05 NOTE — Patient Instructions (Signed)

## 2023-05-05 NOTE — Patient Outreach (Signed)
 Complex Care Management   Visit Note  05/05/2023  Name:  Whitney Henderson MRN: [id number] DOB: [birth year]  Situation: Referral received for Complex Care Management related to  LTC Goals and the process  I obtained verbal consent from  patients daughter .  Visit completed with patients daughter  on the phone  Background:   Past Medical History:  Diagnosis Date   Anxiety    Cataracts, bilateral    Discoid lupus    Headache 04/27/2015   Left>right periorbital   Hypertension    Memory difficulties 09/04/2016    Assessment: Patient Reported Symptoms:  Cognitive        Neurological      HEENT        Cardiovascular      Respiratory      Endocrine      Gastrointestinal        Genitourinary      Integumentary      Musculoskeletal          Psychosocial       Quality of Family Relationships: involved Do you feel physically threatened by others?: No      03/17/2023    1:54 PM  Depression screen PHQ 2/9  Decreased Interest 0  Down, Depressed, Hopeless 0  PHQ - 2 Score 0    There were no vitals filed for this visit.  Medications Reviewed Today   Medications were not reviewed in this encounter     Recommendation:   To continue to follow up with the PCP and make referral in the future when the family is ready to start LTC paperwork for the patient   Follow Up Plan:   Closing From:  Complex Care Management  Jonda Neighbours, PhD [facility], [facility] Social Worker Direct Dial: [phone]  Fax: [phone]

## 2023-05-06 ENCOUNTER — Encounter: Payer: Self-pay | Admitting: Podiatry

## 2023-05-06 ENCOUNTER — Ambulatory Visit: Payer: Medicare Other | Admitting: Podiatry

## 2023-05-06 VITALS — Ht 61.0 in | Wt 114.2 lb

## 2023-05-06 DIAGNOSIS — B351 Tinea unguium: Secondary | ICD-10-CM | POA: Diagnosis not present

## 2023-05-06 DIAGNOSIS — L89619 Pressure ulcer of right heel, unspecified stage: Secondary | ICD-10-CM

## 2023-05-06 DIAGNOSIS — M79674 Pain in right toe(s): Secondary | ICD-10-CM | POA: Diagnosis not present

## 2023-05-06 DIAGNOSIS — M79675 Pain in left toe(s): Secondary | ICD-10-CM

## 2023-05-10 NOTE — Progress Notes (Signed)
 Subjective:  Patient ID: Whitney Henderson, female    DOB: Oct 28, 1928,  MRN: [id number]  Whitney Henderson presents to clinic today for at risk foot care with h/o CKD and callus(es) right heel and painful mycotic toenails that are difficult to trim. Painful toenails interfere with ambulation. Aggravating factors include wearing enclosed shoe gear. Pain is relieved with periodic professional debridement. Painful calluses are aggravated when weightbearing with and without shoegear. Pain is relieved with periodic professional debridement. She is accompanied by her daughter on today's visit. Chief Complaint  Patient presents with   Nail Problem    Pt is here for [facility] PCP is Dr Elnita Hai and LOV was in January.   New problem(s): None.   PCP is Cleave Curling, MD.  Allergies  Allergen Reactions   Exelon  [Rivastigmine ] Itching and Rash   Fish Allergy Other (See Comments)    Does not do well with Seafood.   Codeine Rash    Review of Systems: Negative except as noted in the HPI.  Objective: No changes noted in today's physical examination. There were no vitals filed for this visit. Whitney Henderson is a pleasant 89 y.o. female thin build in NAD. AAO x 3.  Vascular Examination: CFT <3 seconds b/l. DP/PT pulses faintly palpable b/l. Skin temperature gradient warm to warm b/l. No pain with calf compression. No ischemia or gangrene. No cyanosis or clubbing noted b/l. No edema noted b/l LE.   Neurological Examination: Sensation grossly intact b/l with 10 gram monofilament. Vibratory sensation intact b/l.   Dermatological Examination: Pedal skin warm and supple b/l.   No open wounds. No interdigital macerations.  Toenails 1-5 b/l thick, discolored, elongated with subungual debris and pain on dorsal palpation.    Hyperkeratotic lesion(s) posteromedial aspect of right heel.  No erythema, no edema, no drainage, no fluctuance.  Musculoskeletal Examination: Normal muscle strength 5/5 to all lower  extremity muscle groups bilaterally. HAV with bunion bilaterally and hammertoes 2-5 b/l.Aaron Aas No pain, crepitus or joint limitation noted with ROM b/l LE.  Patient ambulates independently without assistive aids.  Radiographs: None  Last A1c:       No data to display         Assessment/Plan: 1. Pain due to onychomycosis of toenails of both feet   2. Pressure injury of skin of right heel, unspecified injury stage     -Patient was evaluated today. All questions/concerns addressed on today's visit. -Patient's family member present. All questions/concerns addressed on today's visit. -Discussed pressure injury of right foot. Recommended heel protector(s) from [employer]. Apply to both feet when in bed or when feet are exposed to pressure. Do not walk with heel protectors on, as it poses a fall risk. -Patient to continue soft, supportive shoe gear daily. -Toenails 1-5 b/l were debrided in length and girth with sterile nail nippers without iatrogenic bleeding.  -As a courtesy, callus(es) right heel pared utilizing sterile scalpel blade without complication or incident. Total number pared=1. -Patient/POA to call should there be question/concern in the interim.   Return in about 3 months (around 08/05/2023).  Whitney Henderson, DPM      [place] LOCATION: [address], [place] [address]  Office [phone]   [facility] LOCATION: [address] [place], [place] [address] Office [phone]

## 2023-05-15 ENCOUNTER — Emergency Department (HOSPITAL_COMMUNITY)
Admission: EM | Admit: 2023-05-15 | Discharge: 2023-05-15 | Disposition: A | Discharge status: Home / Self Care | Attending: Emergency Medicine | Admitting: Emergency Medicine

## 2023-05-15 ENCOUNTER — Emergency Department (HOSPITAL_COMMUNITY)

## 2023-05-15 ENCOUNTER — Other Ambulatory Visit: Payer: Self-pay

## 2023-05-15 DIAGNOSIS — E860 Dehydration: Secondary | ICD-10-CM | POA: Insufficient documentation

## 2023-05-15 DIAGNOSIS — R531 Weakness: Secondary | ICD-10-CM | POA: Diagnosis not present

## 2023-05-15 DIAGNOSIS — N390 Urinary tract infection, site not specified: Secondary | ICD-10-CM | POA: Diagnosis not present

## 2023-05-15 DIAGNOSIS — G20C Parkinsonism, unspecified: Secondary | ICD-10-CM | POA: Insufficient documentation

## 2023-05-15 DIAGNOSIS — R404 Transient alteration of awareness: Secondary | ICD-10-CM | POA: Diagnosis not present

## 2023-05-15 DIAGNOSIS — Z7982 Long term (current) use of aspirin: Secondary | ICD-10-CM | POA: Diagnosis not present

## 2023-05-15 DIAGNOSIS — G459 Transient cerebral ischemic attack, unspecified: Secondary | ICD-10-CM | POA: Diagnosis not present

## 2023-05-15 LAB — COMPREHENSIVE METABOLIC PANEL WITH GFR
ALT: <5 U/L (ref 0–44)
AST: 19 U/L (ref 15–41)
Albumin: 3.7 g/dL (ref 3.5–5.0)
Alkaline Phosphatase: 66 U/L (ref 38–126)
Anion gap: 10 (ref 5–15)
BUN: 31 mg/dL — ABNORMAL HIGH (ref 8–23)
CO2: 20 mmol/L — ABNORMAL LOW (ref 22–32)
Calcium: 9 mg/dL (ref 8.9–10.3)
Chloride: 108 mmol/L (ref 98–111)
Creatinine, Ser: 1.51 mg/dL — ABNORMAL HIGH (ref 0.44–1.00)
GFR, Estimated: 32 mL/min — ABNORMAL LOW (ref >60)
Glucose, Bld: 98 mg/dL (ref 70–99)
Potassium: 3.9 mmol/L (ref 3.5–5.1)
Sodium: 138 mmol/L (ref 135–145)
Total Bilirubin: 0.8 mg/dL (ref 0.0–1.2)
Total Protein: 7.5 g/dL (ref 6.5–8.1)

## 2023-05-15 LAB — CBC WITH DIFFERENTIAL/PLATELET
Abs Immature Granulocytes: 0 10*3/uL (ref 0.00–0.07)
Basophils Absolute: 0.1 10*3/uL (ref 0.0–0.1)
Basophils Relative: 1 %
Eosinophils Absolute: 0.1 10*3/uL (ref 0.0–0.5)
Eosinophils Relative: 2 %
HCT: 33.9 % — ABNORMAL LOW (ref 36.0–46.0)
Hemoglobin: 11.1 g/dL — ABNORMAL LOW (ref 12.0–15.0)
Immature Granulocytes: 0 %
Lymphocytes Relative: 33 %
Lymphs Abs: 1.4 10*3/uL (ref 0.7–4.0)
MCH: 28.2 pg (ref 26.0–34.0)
MCHC: 32.7 g/dL (ref 30.0–36.0)
MCV: 86.3 fL (ref 80.0–100.0)
Monocytes Absolute: 0.6 10*3/uL (ref 0.1–1.0)
Monocytes Relative: 14 %
Neutro Abs: 2.2 10*3/uL (ref 1.7–7.7)
Neutrophils Relative %: 50 %
Platelets: 189 10*3/uL (ref 150–400)
RBC: 3.93 MIL/uL (ref 3.87–5.11)
RDW: 14.8 % (ref 11.5–15.5)
WBC: 4.4 10*3/uL (ref 4.0–10.5)
nRBC: 0 % (ref 0.0–0.2)

## 2023-05-15 LAB — URINALYSIS, ROUTINE W REFLEX MICROSCOPIC
Bilirubin Urine: NEGATIVE
Glucose, UA: NEGATIVE mg/dL
Hgb urine dipstick: NEGATIVE
Ketones, ur: NEGATIVE mg/dL
Nitrite: POSITIVE — AB
Protein, ur: NEGATIVE mg/dL
Specific Gravity, Urine: 1.005 (ref 1.005–1.030)
pH: 7 (ref 5.0–8.0)

## 2023-05-15 MED ORDER — SODIUM CHLORIDE 0.9 % IV BOLUS
1000.0000 mL | Freq: Once | INTRAVENOUS | Status: AC
  Administered 2023-05-15: 1000 mL via INTRAVENOUS

## 2023-05-15 MED ORDER — SODIUM CHLORIDE 0.9 % IV SOLN
1.0000 g | Freq: Once | INTRAVENOUS | Status: AC
  Administered 2023-05-15: 1 g via INTRAVENOUS
  Filled 2023-05-15: qty 10

## 2023-05-15 MED ORDER — CEPHALEXIN 500 MG PO CAPS: 500.0000 mg | ORAL_CAPSULE | Freq: Two times a day (BID) | ORAL | 0 refills | Status: AC

## 2023-05-15 NOTE — ED Triage Notes (Signed)
 Patient to ED by EMS from home with c/o weakness and possible UTI. Per EMS patient daughter states for the last 3 days she has had a changed in strengthen patient has become weaker and this morning she was not verbally responding. She has HX of parkinson and is pleasantly confused. EMS states she is A&Ox2. Per daughter last time this happened to patient she had a UTI she states patient has a foul odor to urine.

## 2023-05-15 NOTE — Discharge Instructions (Signed)
 As discussed, your evaluation today has been largely reassuring.  But, it is important that you monitor your condition carefully, and do not hesitate to return to the ED if you develop new, or concerning changes in your condition. ? ?Otherwise, please follow-up with your physician for appropriate ongoing care. ? ?

## 2023-05-15 NOTE — ED Provider Notes (Signed)
 [place] EMERGENCY DEPARTMENT AT [facility] Provider Note   CSN: [id number] Arrival date & time: 05/15/23  1343     History  Chief Complaint  Patient presents with   Weakness   Urinary Tract Infection    Whitney Henderson is a 89 y.o. female.  HPI Patient presents initially alone, but subsequently accompanied by her daughter.  Patient self has Parkinson's, some baseline confusion, is brought here via EMS with concern for weakness, possible urinary tract infection.  Daughter notes that patient has had prior similar episodes of weakness, mild worsening of confusion, each time with associated urinary tract infection patient self denies focal pain, focal weakness.  She has seemingly been taking her medication as directed.    Home Medications Prior to Admission medications   Medication Sig Start Date End Date Taking? Authorizing Provider  acetaminophen  (TYLENOL ) 500 MG tablet Take 500 mg by mouth every 6 (six) hours as needed for mild pain or moderate pain.    [provider]  amLODipine  (NORVASC ) 2.5 MG tablet TAKE 1 TABLET(2.5 MG) BY MOUTH TWICE DAILY WITH BREAKFAST AND DINNER 03/10/23   Cleave Curling, MD  Apoaequorin (PREVAGEN PO) Take 1 capsule by mouth every other day.    [provider]  aspirin  81 MG chewable tablet Chew 1 tablet (81 mg total) by mouth 2 (two) times daily. 11/06/21   Arnie Lao, MD  betamethasone dipropionate (DIPROLENE) 0.05 % cream APPLY THIN LAYER EXTERNALLY TO THE AFFECTED AREA EVERY DAY AS NEEDED Patient taking differently: 1 application  daily as needed (skin irritation). 05/25/18   Susanna Epley, FNP  Calcium Carb-Cholecalciferol (CALCIUM 500 +D PO) Take 1 tablet by mouth every other day.    [provider]  carbidopa -levodopa  (SINEMET  IR) 25-100 MG tablet Take 1 tablet by mouth 3 (three) times daily. 03/11/23   Wess Hammed, NP  Multiple Vitamins-Minerals (CENTRUM SILVER PO) Take 1 tablet by mouth every  other day.    [provider]  Propylene Glycol (SYSTANE BALANCE OP) Place 1 drop into both eyes daily as needed (for itchy eyes).    [provider]  Romosozumab -aqqg (EVENITY ) 105 MG/1.17ML SOSY injection Inject 210 mg into the skin once. Evenity  210mg  monthly x 12 doses.    [provider]  sertraline  (ZOLOFT ) 25 MG tablet TAKE 1 TABLET(25 MG) BY MOUTH DAILY 12/13/22   Cleave Curling, MD  sertraline  (ZOLOFT ) 50 MG tablet Take 1 tablet (50 mg total) by mouth daily. 04/16/23 04/15/24  Cleave Curling, MD  thiamine  100 MG tablet Take 100 mg by mouth daily.    [provider]  triamcinolone  cream (KENALOG ) 0.1 % APPLY TO AFFECTED AREA TWICE DAILY AS NEEDED Patient taking differently: Apply 1 Application topically 2 (two) times daily as needed (for flares). 04/16/21   Cleave Curling, MD  vitamin C  (ASCORBIC ACID ) 500 MG tablet Take 500 mg by mouth every other day.    [provider]      Allergies    Exelon  [rivastigmine ], Fish allergy, and Codeine    Review of Systems   Review of Systems  Physical Exam Updated Vital Signs BP (!) 151/71   Pulse (!) 31   Temp 98.6 F (37 C)   Resp 16   Ht 5\' 1"  (1.549 m)   Wt 52.2 kg   SpO2 100%   BMI 21.73 kg/m  Physical Exam Vitals and nursing note reviewed.  Constitutional:      General: She is not in acute distress.  Appearance: She is well-developed. She is ill-appearing.  HENT:     Head: Normocephalic and atraumatic.  Eyes:     Conjunctiva/sclera: Conjunctivae normal.  Cardiovascular:     Rate and Rhythm: Normal rate and regular rhythm.  Pulmonary:     Effort: Pulmonary effort is normal. No respiratory distress.     Breath sounds: Normal breath sounds. No stridor.  Abdominal:     General: There is no distension.  Skin:    General: Skin is warm and dry.  Neurological:     Mental Status: She is alert.     Cranial Nerves: No cranial nerve deficit.     Motor: Weakness, tremor and atrophy  present.  Psychiatric:        Mood and Affect: Mood normal.     ED Results / Procedures / Treatments   Labs (all labs ordered are listed, but only abnormal results are displayed) Labs Reviewed  COMPREHENSIVE METABOLIC PANEL WITH GFR - Abnormal; Notable for the following components:      Result Value   CO2 20 (*)    BUN 31 (*)    Creatinine, Ser 1.51 (*)    GFR, Estimated 32 (*)    All other components within normal limits  CBC WITH DIFFERENTIAL/PLATELET - Abnormal; Notable for the following components:   Hemoglobin 11.1 (*)    HCT 33.9 (*)    All other components within normal limits  URINALYSIS, ROUTINE W REFLEX MICROSCOPIC - Abnormal; Notable for the following components:   Color, Urine STRAW (*)    APPearance HAZY (*)    Nitrite POSITIVE (*)    Leukocytes,Ua TRACE (*)    Bacteria, UA MANY (*)    All other components within normal limits    EKG EKG Interpretation Date/Time:  Thursday May 15 2023 14:03:30 EDT Ventricular Rate:  59 PR Interval:  163 QRS Duration:  117 QT Interval:  449 QTC Calculation: 445 R Axis:   -18  Text Interpretation: Sinus rhythm Probable left ventricular hypertrophy Anterior Q waves, possibly due to LVH Nonspecific T abnormalities, inferior leads Confirmed by Dorenda Gandy [phone]) on 05/15/2023 2:27:20 PM  Radiology DG Chest Port 1 View Result Date: 05/15/2023 CLINICAL DATA:  Weakness EXAM: PORTABLE CHEST 1 VIEW COMPARISON:  November 03, 2023 FINDINGS: The heart size and mediastinal contours are within normal limits. Both lungs are clear. The visualized skeletal structures are unremarkable. No change calcified 2 mm granuloma right upper lobe IMPRESSION: No active disease. Electronically Signed   By: Fredrich Jefferson M.D.   On: 05/15/2023 15:26    Procedures Procedures    Medications Ordered in ED Medications  cefTRIAXone  (ROCEPHIN ) 1 g in sodium chloride  0.9 % 100 mL IVPB (1 g Intravenous New Bag/Given 05/15/23 1607)  sodium chloride  0.9 %  bolus 1,000 mL (1,000 mLs Intravenous New Bag/Given 05/15/23 1606)    ED Course/ Medical Decision Making/ A&P                                 Medical Decision Making Elderly female with Parkinson's, multiple other medical problems presents with withdrawn status, mild generalized weakness, decreased interactivity.  Broad differential including infection, bacteremia, sepsis, electrolyte abnormalities, stroke, worsening Parkinson's, medication effects.  Case discussed with her daughter after she arrived, labs urinalysis sent. Patient initially hypertensive though this improves.  Patient found have evidence for both mild dehydration as well as urinary tract infection, but no distress.  Amount and/or  Complexity of Data Reviewed Independent Historian: caregiver External Data Reviewed: notes. Labs: ordered. Decision-making details documented in ED Course. Radiology: ordered. ECG/medicine tests: ordered and independent interpretation performed. Decision-making details documented in ED Course.   4:28 PM Patient in no distress, hemodynamically stable, awake, alert, as above evidence for urinary tract infection, mild dehydration, patient appropriate for discharge after resuscitation and antibiotics.        Final Clinical Impression(s) / ED Diagnoses Final diagnoses:  Lower urinary tract infectious disease  Dehydration    Rx / DC Orders ED Discharge Orders     None         Dorenda Gandy, MD 05/15/23 1628

## 2023-05-19 ENCOUNTER — Telehealth: Payer: Self-pay

## 2023-05-19 ENCOUNTER — Ambulatory Visit

## 2023-05-19 ENCOUNTER — Ambulatory Visit: Payer: Self-pay | Admitting: Internal Medicine

## 2023-05-19 MED ORDER — ROMOSOZUMAB-AQQG 105 MG/1.17ML ~~LOC~~ SOSY: 210.0000 mg | PREFILLED_SYRINGE | Freq: Once | SUBCUTANEOUS | Status: DC

## 2023-05-19 NOTE — Telephone Encounter (Signed)
 Auth Submission: APPROVED Site of care: Site of care: [facility] Payer: [employer] medicare Medication & CPT/J Code(s) submitted: Evenity  (Romosozumab ) [id number] Route of submission (phone, fax, portal): portal Phone # Fax # Auth type: Buy/Bill PB Units/visits requested: 210mg  x 1 dose - to complete the 12 doses Reference # [id number] Approval from: 05/19/23 to 05/18/24

## 2023-05-19 NOTE — Transitions of Care (Post Inpatient/ED Visit) (Signed)
 05/19/2023  Name: Whitney Henderson MRN: [id number] DOB: Dec 11, 1928  Today's TOC FU Call Status: Today's TOC FU Call Status:: Successful TOC FU Call Completed TOC FU Call Complete Date: 05/19/23 Patient's Name and Date of Birth confirmed.  Transition Care Management Follow-up Telephone Call Date of Discharge: 05/15/23 Discharge Facility: [facility]) Type of Discharge: Emergency Department Reason for ED Visit: Other: (UTI) How have you been since you were released from the hospital?: Same Any questions or concerns?: No  Items Reviewed: Did you receive and understand the discharge instructions provided?: Yes Medications obtained,verified, and reconciled?: Yes (Medications Reviewed) Any new allergies since your discharge?: No Dietary orders reviewed?: NA Do you have support at home?: Yes People in Home [RPT]: child(ren), adult  Medications Reviewed Today: Medications Reviewed Today     Reviewed by Cathye Coca, LPN (Licensed Practical Nurse) on 05/19/23 at 1000  Med List Status: <None>   Medication Order Taking? Sig Documenting Provider Last Dose Status Informant  acetaminophen  (TYLENOL ) 500 MG tablet [id number] Yes Take 500 mg by mouth every 6 (six) hours as needed for mild pain or moderate pain. [provider] Taking Active Child, Self  amLODipine  (NORVASC ) 2.5 MG tablet [id number] Yes TAKE 1 TABLET(2.5 MG) BY MOUTH TWICE DAILY WITH BREAKFAST AND [name], MD Taking Active   Apoaequorin (PREVAGEN PO) [id number] Yes Take 1 capsule by mouth every other day. [provider] Taking Active Child, Self  aspirin  81 MG chewable tablet [id number] Yes Chew 1 tablet (81 mg total) by mouth 2 (two) times daily. Arnie Lao, MD Taking Active   betamethasone dipropionate (DIPROLENE) 0.05 % cream [id number] Yes APPLY THIN LAYER EXTERNALLY TO THE AFFECTED AREA EVERY DAY AS NEEDED  Patient taking differently: 1 application  daily as needed (skin  irritation).   Susanna Epley, FNP Taking Active Child, Self  Calcium Carb-Cholecalciferol (CALCIUM 500 +D PO) [id number] Yes Take 1 tablet by mouth every other day. [provider] Taking Active Child, Self  carbidopa -levodopa  (SINEMET  IR) 25-100 MG tablet [id number] Yes Take 1 tablet by mouth 3 (three) times daily. Wess Hammed, NP Taking Active   cephALEXin  (KEFLEX ) 500 MG capsule [id number] Yes Take 1 capsule (500 mg total) by mouth 2 (two) times daily for 5 days. Dorenda Gandy, MD Taking Active   Multiple Vitamins-Minerals (CENTRUM SILVER PO) [id number] Yes Take 1 tablet by mouth every other day. [provider] Taking Active Child, Self  Propylene Glycol (SYSTANE BALANCE OP) [id number] Yes Place 1 drop into both eyes daily as needed (for itchy eyes). [provider] Taking Active Child, Self  Romosozumab -aqqg (EVENITY ) 105 MG/1. SOSY injection [id number] Yes Inject 210 mg into the skin once. Evenity  210mg  monthly x 12 doses. [provider] Taking Active   sertraline  (ZOLOFT ) 25 MG tablet [id number] Yes TAKE 1 TABLET(25 MG) BY MOUTH DAILY Cleave Curling, MD Taking Active   sertraline  (ZOLOFT ) 50 MG tablet [id number] Yes Take 1 tablet (50 mg total) by mouth daily. Cleave Curling, MD Taking Active   thiamine  100 MG tablet [id number] Yes Take 100 mg by mouth daily. [provider] Taking Active Child, Self  triamcinolone  cream (KENALOG ) 0.1 % [id number] Yes APPLY TO AFFECTED AREA TWICE DAILY AS NEEDED  Patient taking differently: Apply 1 Application topically 2 (two) times daily as needed (for flares).   Cleave Curling, MD Taking Active Child, Self  vitamin C  (ASCORBIC ACID ) 500 MG tablet [id number] Yes Take 500 mg by mouth every other day. [provider]  Taking Active Child, Self            Home Care and Equipment/Supplies: Were Home Health Services Ordered?: NA Any new equipment or medical supplies ordered?: NA  Functional  Questionnaire: Do you need assistance with bathing/showering or dressing?: Yes Do you need assistance with meal preparation?: Yes Do you need assistance with eating?: No Do you have difficulty maintaining continence: Yes Do you need assistance with getting out of bed/getting out of a chair/moving?: Yes Do you have difficulty managing or taking your medications?: Yes  Follow up appointments reviewed: PCP Follow-up appointment confirmed?: Yes Date of PCP follow-up appointment?: 05/21/23 Follow-up Provider: Dr. Cleave Curling Specialist [facility] Follow-up appointment confirmed?: NA Do you need transportation to your follow-up appointment?: No Do you understand care options if your condition(s) worsen?: Yes-patient verbalized understanding    SIGNATURE [place], LPN [facility] Health Advisor [place] [facility] You Are. We Are. [facility] Direct Dial [phone]

## 2023-05-21 ENCOUNTER — Ambulatory Visit: Payer: Self-pay | Admitting: Internal Medicine

## 2023-05-22 ENCOUNTER — Encounter: Payer: Self-pay | Admitting: Family Medicine

## 2023-05-22 ENCOUNTER — Ambulatory Visit: Admitting: Family Medicine

## 2023-05-22 VITALS — BP 112/70 | HR 81 | Temp 98.1°F | Ht 61.0 in | Wt 114.0 lb

## 2023-05-22 DIAGNOSIS — L930 Discoid lupus erythematosus: Secondary | ICD-10-CM | POA: Insufficient documentation

## 2023-05-22 DIAGNOSIS — G20C Parkinsonism, unspecified: Secondary | ICD-10-CM

## 2023-05-22 DIAGNOSIS — F419 Anxiety disorder, unspecified: Secondary | ICD-10-CM | POA: Diagnosis not present

## 2023-05-22 DIAGNOSIS — N390 Urinary tract infection, site not specified: Secondary | ICD-10-CM

## 2023-05-22 NOTE — Progress Notes (Unsigned)
 I,[name], CMA,acting as a Neurosurgeon for [employer], NP.,have documented all relevant documentation on the behalf of Melodie Spry, NP,as directed by  Melodie Spry, NP while in the presence of Melodie Spry, NP.  Subjective:  Patient ID: Whitney Henderson , female    DOB: 1928-05-08 , 89 y.o.   MRN: [id number]  Chief Complaint  Patient presents with  . med check  . ER f/u    HPI  Patient presents today for a med check on the sertraline  and a ER f/u. Patient reports she is doing well with the sertraline  and does not have any concerns. Patient also reports she feels about the same since going to the ER. Patient was given Keflex  500 mg BID for 5 days for UTI. She finished her antibiotics yesterday.    Past Medical History:  Diagnosis Date  . Anxiety   . Cataracts, bilateral   . Discoid lupus   . Headache 04/27/2015   Left>right periorbital  . Hypertension   . Memory difficulties 09/04/2016     Family History  Problem Relation Age of Onset  . Early death Mother   . Cancer Father   . Thyroid  disease Daughter   . Breast cancer Daughter        91s  . Glaucoma Daughter   . Hypertension Daughter   . Heart attack Son   . Thyroid  disease Son      Current Outpatient Medications:  .  acetaminophen  (TYLENOL ) 500 MG tablet, Take 500 mg by mouth every 6 (six) hours as needed for mild pain or moderate pain., Disp: , Rfl:  .  amLODipine  (NORVASC ) 2.5 MG tablet, TAKE 1 TABLET(2.5 MG) BY MOUTH TWICE DAILY WITH BREAKFAST AND DINNER, Disp: 180 tablet, Rfl: 1 .  Apoaequorin (PREVAGEN PO), Take 1 capsule by mouth every other day., Disp: , Rfl:  .  betamethasone dipropionate (DIPROLENE) 0.05 % cream, APPLY THIN LAYER EXTERNALLY TO THE AFFECTED AREA EVERY DAY AS NEEDED (Patient taking differently: 1 application  daily as needed (skin irritation).), Disp: 60 g, Rfl: 2 .  Calcium Carb-Cholecalciferol (CALCIUM 500 +D PO), Take 1 tablet by mouth every other day., Disp: , Rfl:  .  carbidopa -levodopa   (SINEMET  IR) 25-100 MG tablet, Take 1 tablet by mouth 3 (three) times daily., Disp: 270 tablet, Rfl: 1 .  Multiple Vitamins-Minerals (CENTRUM SILVER PO), Take 1 tablet by mouth every other day., Disp: , Rfl:  .  Propylene Glycol (SYSTANE BALANCE OP), Place 1 drop into both eyes daily as needed (for itchy eyes)., Disp: , Rfl:  .  Romosozumab -aqqg (EVENITY ) 105 MG/1. SOSY injection, Inject 210 mg into the skin once. Evenity  210mg  monthly x 12 doses., Disp: , Rfl:  .  sertraline  (ZOLOFT ) 50 MG tablet, Take 1 tablet (50 mg total) by mouth daily., Disp: 90 tablet, Rfl: 1 .  thiamine  100 MG tablet, Take 100 mg by mouth daily., Disp: , Rfl:  .  triamcinolone  cream (KENALOG ) 0.1 %, APPLY TO AFFECTED AREA TWICE DAILY AS NEEDED (Patient taking differently: Apply 1 Application topically 2 (two) times daily as needed (for flares).), Disp: 45 g, Rfl: 0 .  aspirin  81 MG chewable tablet, Chew 1 tablet (81 mg total) by mouth 2 (two) times daily. (Patient not taking: Reported on 05/22/2023), Disp: 30 tablet, Rfl: 0 .  vitamin C  (ASCORBIC ACID ) 500 MG tablet, Take 500 mg by mouth every other day. (Patient not taking: Reported on 05/22/2023), Disp: , Rfl:    Allergies  Allergen Reactions  .  Exelon  [Rivastigmine ] Itching and Rash  . Fish Allergy Other (See Comments)    Does not do well with Seafood.  . Codeine Rash     Review of Systems  Constitutional: Negative.   HENT: Negative.    Respiratory: Negative.    Cardiovascular: Negative.   Gastrointestinal: Negative.   Musculoskeletal:  Positive for gait problem.  Neurological:  Positive for tremors.  Psychiatric/Behavioral:  The patient is nervous/anxious.      Today's Vitals   05/22/23 1149  BP: 112/70  Pulse: 81  Temp: 98.1 F (36.7 C)  TempSrc: Oral  Weight: 114 lb (51.7 kg)  Height: 5\' 1"  (1.549 m)  PainSc: 0-No pain   Body mass index is 21.54 kg/m.  Wt Readings from Last 3 Encounters:  05/22/23 114 lb (51.7 kg)  05/15/23 115 lb (52.2 kg)   05/06/23 114 lb 3.2 oz (51.8 kg)    The ASCVD Risk score (Arnett DK, et al., 2019) failed to calculate for the following reasons:   The 2019 ASCVD risk score is only valid for ages 78 to 23  Objective:  Physical Exam HENT:     Head: Normocephalic.  Cardiovascular:     Rate and Rhythm: Normal rate and regular rhythm.  Pulmonary:     Effort: Pulmonary effort is normal.     Breath sounds: Normal breath sounds.  Neurological:     Mental Status: She is alert.        Assessment And Plan:  Urinary tract infection without hematuria, site unspecified  Anxiety  Discoid lupus erythematosus    Return if symptoms worsen or fail to improve, for Keep next appt.  Patient was given opportunity to ask questions. Patient verbalized understanding of the plan and was able to repeat key elements of the plan. All questions were answered to their satisfaction.    I, Melodie Spry, NP, have reviewed all documentation for this visit. The documentation on 05/22/23 for the exam, diagnosis, procedures, and orders are all accurate and complete.   IF YOU HAVE BEEN REFERRED TO A SPECIALIST, IT MAY TAKE 1-2 WEEKS TO SCHEDULE/PROCESS THE REFERRAL. IF YOU HAVE NOT HEARD FROM US /SPECIALIST IN TWO WEEKS, PLEASE GIVE US  A CALL AT [phone].

## 2023-05-26 ENCOUNTER — Ambulatory Visit: Payer: Self-pay

## 2023-05-26 NOTE — Patient Outreach (Signed)
 Complex Care Management   Visit Note  05/26/2023  Name:  Whitney Henderson MRN: [id number] DOB: Jun 14, 1928  Situation: Referral received for Complex Care Management related to  Parkinsonism, Mild Cognitive Dementia, recurrent UTI.  I obtained verbal consent from Patient.  Visit completed with patient on the phone.  Background:   Past Medical History:  Diagnosis Date   Anxiety    Cataracts, bilateral    Discoid lupus    Headache 04/27/2015   Left>right periorbital   Hypertension    Memory difficulties 09/04/2016    Assessment: Patient Reported Symptoms:  Cognitive Cognitive Status: Alert and oriented to person, place, and time Cognitive/Intellectual Conditions Management [RPT]: Other (mild cognitive impairment)   Health Maintenance Behaviors: Annual physical exam, Sleep adequate, Healthy diet Healing Pattern: Slow Health Facilitated by: Rest, Healthy diet  Neurological Neurological Review of Symptoms: Other: [place] Neurological Symptoms/Conditions [RPT]: tremors, impaired gait, hallucinations, mild cognitive impairment Neurological Conditions: Parkinson's disease Neurological Management Strategies: Medication therapy, Exercise, Routine screening, Adequate rest Neurological Self-Management Outcome: 4 (good)  HEENT HEENT Symptoms Reported: No symptoms reported      Cardiovascular Cardiovascular Symptoms Reported: No symptoms reported Does patient have uncontrolled Hypertension?: No Cardiovascular Conditions: Hypertension Cardiovascular Management Strategies: Routine screening, Medication therapy, Exercise, Diet modification Cardiovascular Self-Management Outcome: 4 (good)  Respiratory Respiratory Symptoms Reported: No symptoms reported    Endocrine Patient reports the following symptoms related to hypoglycemia or hyperglycemia : No symptoms reported Is patient diabetic?: No    Gastrointestinal Gastrointestinal Symptoms Reported: No symptoms reported      Genitourinary  Genitourinary Symptoms Reported: No symptoms reported Genitourinary Conditions: Urinary tract infection, Chronic kidney disease Genitourinary Management Strategies: Medication therapy, Fluid modification Genitourinary Self-Management Outcome: 3 (uncertain)  Integumentary Integumentary Symptoms Reported: No symptoms reported Skin Conditions: Other Other Skin Conditions: discoid lupus erythema Skin Management Strategies: Routine screening Skin Self-Management Outcome: 4 (good)  Musculoskeletal Musculoskelatal Symptoms Reviewed: Difficulty walking, Unsteady gait Musculoskeletal Conditions: Mobility limited, Unsteady gait Musculoskeletal Management Strategies: Routine screening, Medical device Musculoskeletal Self-Management Outcome: 4 (good) Falls in the past year?: Yes Number of falls in past year: 1 or less Was there an injury with Fall?: Yes Fall Risk Category Calculator: 2 Patient Fall Risk Level: Moderate Fall Risk Patient at Risk for Falls Due to: History of fall(s), Impaired mobility  Psychosocial Psychosocial Symptoms Reported: Anxiety - if selected complete GAD Behavioral Health Conditions: Anxiety Behavioral Management Strategies: Medication therapy, Support system Behavioral Health Self-Management Outcome: 4 (good) Major Change/Loss/Stressor/Fears (CP): Medical condition, family, Medical condition, self Techniques to Cope with Loss/Stress/Change: Spiritual practice(s), Exercise Quality of Family Relationships: involved, supportive Do you feel physically threatened by others?: No      05/26/2023    3:07 PM  Depression screen PHQ 2/9  Decreased Interest 0  Down, Depressed, Hopeless 0  PHQ - 2 Score 0    There were no vitals filed for this visit.  Medications Reviewed Today     Reviewed by Kaylene Pascal, RN (Registered Nurse) on 05/26/23 at 1453  Med List Status: <None>   Medication Order Taking? Sig Documenting Provider Last Dose Status Informant  acetaminophen   (TYLENOL ) 500 MG tablet [id number] Yes Take 500 mg by mouth every 6 (six) hours as needed for mild pain or moderate pain. [provider] Taking Active Child, Self  amLODipine  (NORVASC ) 2.5 MG tablet [id number] Yes TAKE 1 TABLET(2.5 MG) BY MOUTH TWICE DAILY WITH BREAKFAST AND [name], MD Taking Active   Apoaequorin (PREVAGEN PO) [id number] Yes Take 1 capsule  by mouth every other day. [provider] Taking Active Child, Self  aspirin  81 MG chewable tablet [id number] No Chew 1 tablet (81 mg total) by mouth 2 (two) times daily.  Patient not taking: Reported on 05/26/2023   Arnie Lao, MD Not Taking Active   b complex vitamins capsule [id number] Yes Take 1 capsule by mouth every other day. [provider] Taking Active   betamethasone dipropionate (DIPROLENE) 0.05 % cream [id number] No APPLY THIN LAYER EXTERNALLY TO THE AFFECTED AREA EVERY DAY AS NEEDED  Patient not taking: Reported on 05/26/2023   Susanna Epley, FNP Not Taking Consider Medication Status and Discontinue (Patient Preference) Child, Self  Calcium Carb-Cholecalciferol (CALCIUM 500 +D PO) [id number] Yes Take 1 tablet by mouth every other day. [provider] Taking Active Child, Self  carbidopa -levodopa  (SINEMET  IR) 25-100 MG tablet [id number] Yes Take 1 tablet by mouth 3 (three) times daily. Wess Hammed, NP Taking Active   Multiple Vitamins-Minerals (CENTRUM SILVER PO) [id number] Yes Take 1 tablet by mouth every other day. [provider] Taking Active Child, Self  Propylene Glycol (SYSTANE BALANCE OP) [id number] Yes Place 1 drop into both eyes daily as needed (for itchy eyes). [provider] Taking Active Child, Self  Romosozumab -aqqg (EVENITY ) 105 MG/1. SOSY injection [id number] Yes Inject 210 mg into the skin once. Evenity  210mg  monthly x 12 doses. Patient has one remaining doses. [provider] Taking Active   sertraline  (ZOLOFT ) 50 MG tablet [id number]  Yes Take 1 tablet (50 mg total) by mouth daily. Cleave Curling, MD Taking Active   thiamine  100 MG tablet [id number] Yes Take 100 mg by mouth daily. [provider] Taking Active Child, Self  triamcinolone  cream (KENALOG ) 0.1 % [id number] Yes APPLY TO AFFECTED AREA TWICE DAILY AS NEEDED  Patient taking differently: Apply 1 Application topically 2 (two) times daily as needed (for flares).   Cleave Curling, MD Taking Active Child, Self  vitamin C  (ASCORBIC ACID ) 500 MG tablet [id number] No Take 500 mg by mouth every other day.  Patient not taking: Reported on 05/22/2023   [provider] Not Taking Active Child, Self            Recommendation:   PCP Follow-up  Follow Up Plan:   Telephone follow up appointment date/time:  Friday, June 6 at 2:30 PM  Louanne Roussel RN BSN CCM   [facility] Nurse Care Coordinator  Direct Dial: [phone] Website: [email or web address]

## 2023-05-28 ENCOUNTER — Telehealth: Payer: Self-pay | Admitting: Neurology

## 2023-05-28 NOTE — Telephone Encounter (Signed)
 Call to patient daughter, she reports increased hallucination and non responsive periods of just staring off. Recently treat in ER for UTI and finished antibiotic therapy. She reported is patient is still eating and drinking, but sleeping a lot. Offered VV or in persona and daughter not sure how to use mychart with explanation and can't get her here. Advised I would sent to sarah for review.

## 2023-05-28 NOTE — Telephone Encounter (Signed)
 Pt's daughter has concerns about hallucinations pt is having , and episodes of not responding when being spoken to.  Pt's daughter is wanting to discuss a medication for pt's brain, please call her to discuss.

## 2023-05-28 NOTE — Telephone Encounter (Signed)
 Chart reviewed, patient in the ER 05/15/23 for mild dehydration, UTI.  She saw PCP 05/22/2023, I reviewed the note.  Daughter reported doing well on Zoloft  and no concerns or side effect.  Treated with Keflex  500 mg twice a day for 5 days.  Daughter reported she was back to her baseline.  Plan to continue Zoloft  50 mg daily.  I would not start anything different for memory right now, especially if she is not feeling any better.  She was unable to tolerate Aricept  due to weight loss, had a rash with Exelon .

## 2023-05-28 NOTE — Assessment & Plan Note (Signed)
 Followed by neurology;On sinemet  25-100 mg three times daily .

## 2023-05-28 NOTE — Telephone Encounter (Signed)
 Called and relayed msg from sarah to pt daughter:  I would not start anything different for memory right now, especially if she is not feeling any better.  She was unable to tolerate Aricept  due to weight loss, had a rash with Exelon .   Pt daughter voiced gratitude and understanding

## 2023-05-28 NOTE — Assessment & Plan Note (Signed)
 Stable.On Sertraline  50 mg every day.

## 2023-05-28 NOTE — Assessment & Plan Note (Signed)
 Chronic. Well controlled.uses Diprolene ointment as needed.

## 2023-05-29 ENCOUNTER — Ambulatory Visit: Admitting: Internal Medicine

## 2023-05-30 ENCOUNTER — Emergency Department (HOSPITAL_COMMUNITY)
Admission: EM | Admit: 2023-05-30 | Discharge: 2023-05-31 | Disposition: A | Discharge status: Home / Self Care | Attending: Emergency Medicine | Admitting: Emergency Medicine

## 2023-05-30 ENCOUNTER — Emergency Department (HOSPITAL_COMMUNITY)

## 2023-05-30 ENCOUNTER — Other Ambulatory Visit: Payer: Self-pay

## 2023-05-30 DIAGNOSIS — G20A1 Parkinson's disease without dyskinesia, without mention of fluctuations: Secondary | ICD-10-CM | POA: Insufficient documentation

## 2023-05-30 DIAGNOSIS — Z7982 Long term (current) use of aspirin: Secondary | ICD-10-CM | POA: Insufficient documentation

## 2023-05-30 DIAGNOSIS — R4182 Altered mental status, unspecified: Secondary | ICD-10-CM | POA: Diagnosis present

## 2023-05-30 DIAGNOSIS — R4189 Other symptoms and signs involving cognitive functions and awareness: Secondary | ICD-10-CM

## 2023-05-30 DIAGNOSIS — G9389 Other specified disorders of brain: Secondary | ICD-10-CM | POA: Diagnosis not present

## 2023-05-30 DIAGNOSIS — N390 Urinary tract infection, site not specified: Secondary | ICD-10-CM | POA: Diagnosis not present

## 2023-05-30 DIAGNOSIS — G3184 Mild cognitive impairment, so stated: Secondary | ICD-10-CM | POA: Diagnosis not present

## 2023-05-30 DIAGNOSIS — I6523 Occlusion and stenosis of bilateral carotid arteries: Secondary | ICD-10-CM | POA: Diagnosis not present

## 2023-05-30 DIAGNOSIS — G20A2 Parkinson's disease without dyskinesia, with fluctuations: Secondary | ICD-10-CM

## 2023-05-30 LAB — CBC WITH DIFFERENTIAL/PLATELET
Abs Immature Granulocytes: 0.02 10*3/uL (ref 0.00–0.07)
Basophils Absolute: 0.1 10*3/uL (ref 0.0–0.1)
Basophils Relative: 1 %
Eosinophils Absolute: 0.1 10*3/uL (ref 0.0–0.5)
Eosinophils Relative: 2 %
HCT: 36.6 % (ref 36.0–46.0)
Hemoglobin: 11.9 g/dL — ABNORMAL LOW (ref 12.0–15.0)
Immature Granulocytes: 1 %
Lymphocytes Relative: 28 %
Lymphs Abs: 1.1 10*3/uL (ref 0.7–4.0)
MCH: 28.9 pg (ref 26.0–34.0)
MCHC: 32.5 g/dL (ref 30.0–36.0)
MCV: 88.8 fL (ref 80.0–100.0)
Monocytes Absolute: 0.6 10*3/uL (ref 0.1–1.0)
Monocytes Relative: 14 %
Neutro Abs: 2.2 10*3/uL (ref 1.7–7.7)
Neutrophils Relative %: 54 %
Platelets: 207 10*3/uL (ref 150–400)
RBC: 4.12 MIL/uL (ref 3.87–5.11)
RDW: 14.9 % (ref 11.5–15.5)
WBC: 4 10*3/uL (ref 4.0–10.5)
nRBC: 0 % (ref 0.0–0.2)

## 2023-05-30 LAB — BASIC METABOLIC PANEL WITH GFR
Anion gap: 9 (ref 5–15)
BUN: 27 mg/dL — ABNORMAL HIGH (ref 8–23)
CO2: 21 mmol/L — ABNORMAL LOW (ref 22–32)
Calcium: 9.2 mg/dL (ref 8.9–10.3)
Chloride: 107 mmol/L (ref 98–111)
Creatinine, Ser: 1.52 mg/dL — ABNORMAL HIGH (ref 0.44–1.00)
GFR, Estimated: 32 mL/min — ABNORMAL LOW (ref >60)
Glucose, Bld: 102 mg/dL — ABNORMAL HIGH (ref 70–99)
Potassium: 4.2 mmol/L (ref 3.5–5.1)
Sodium: 137 mmol/L (ref 135–145)

## 2023-05-30 LAB — URINALYSIS, ROUTINE W REFLEX MICROSCOPIC
Bilirubin Urine: NEGATIVE
Glucose, UA: NEGATIVE mg/dL
Hgb urine dipstick: NEGATIVE
Ketones, ur: NEGATIVE mg/dL
Leukocytes,Ua: NEGATIVE
Nitrite: NEGATIVE
Protein, ur: NEGATIVE mg/dL
Specific Gravity, Urine: 1.004 — ABNORMAL LOW (ref 1.005–1.030)
pH: 6 (ref 5.0–8.0)

## 2023-05-30 NOTE — ED Notes (Signed)
 EMS reports pt has not had her parkinson's meds tonight

## 2023-05-30 NOTE — ED Triage Notes (Signed)
 Pt BIB [facility] from home. Pt family reports pt has not been acting normal and has been more quiet.Whitney Henderson Hx UTI, dementia. Pt denies pain. Pt family reports pt has had increase in urination.  132/80 63HR 100% RA 146 CBG

## 2023-05-31 NOTE — ED Provider Notes (Signed)
 [place] [facility]ERGENCY DEPARTMENT AT [facility] Provider Note   CSN: [id number] Arrival date & time: 05/30/23  2050     History  Chief Complaint  Patient presents with   Altered Mental Status    Whitney Henderson is a 89 y.o. female.  The history is provided by a relative. The history is limited by the condition of the patient (level 5 caveat dementia).  Altered Mental Status Presenting symptoms: behavior changes   Presenting symptoms: no confusion   Presenting symptoms comment:  Less communicative  Severity:  Moderate Most recent episode:  Today Episode history:  Single Timing:  Constant Progression:  Unchanged Chronicity:  Recurrent Context: dementia   Context comment:  Parkinsonism Associated symptoms: no bladder incontinence, no difficulty breathing, no fever, no seizures, no slurred speech, no vomiting and no weakness        Home Medications Prior to Admission medications   Medication Sig Start Date End Date Taking? Authorizing Provider  acetaminophen  (TYLENOL ) 500 MG tablet Take 500 mg by mouth every 6 (six) hours as needed for mild pain or moderate pain.    [provider]  amLODipine  (NORVASC ) 2.5 MG tablet TAKE 1 TABLET(2.5 MG) BY MOUTH TWICE DAILY WITH BREAKFAST AND DINNER 03/10/23   Cleave Curling, MD  Apoaequorin (PREVAGEN PO) Take 1 capsule by mouth every other day.    [provider]  aspirin  81 MG chewable tablet Chew 1 tablet (81 mg total) by mouth 2 (two) times daily. Patient not taking: Reported on 05/26/2023 11/06/21   Arnie Lao, MD  b complex vitamins capsule Take 1 capsule by mouth every other day.    [provider]  betamethasone dipropionate (DIPROLENE) 0.05 % cream APPLY THIN LAYER EXTERNALLY TO THE AFFECTED AREA EVERY DAY AS NEEDED Patient not taking: Reported on 05/26/2023 05/25/18   Susanna Epley, FNP  Calcium Carb-Cholecalciferol (CALCIUM 500 +D PO) Take 1 tablet by mouth every other day.    [provider]  carbidopa -levodopa  (SINEMET  IR) 25-100 MG tablet Take 1 tablet by mouth 3 (three) times daily. 03/11/23   Wess Hammed, NP  Multiple Vitamins-Minerals (CENTRUM SILVER PO) Take 1 tablet by mouth every other day.    [provider]  Propylene Glycol (SYSTANE BALANCE OP) Place 1 drop into both eyes daily as needed (for itchy eyes).    [provider]  Romosozumab -aqqg (EVENITY ) 105 MG/1.17ML SOSY injection Inject 210 mg into the skin once. Evenity  210mg  monthly x 12 doses. Patient has one remaining doses.    [provider]  sertraline  (ZOLOFT ) 50 MG tablet Take 1 tablet (50 mg total) by mouth daily. 04/16/23 04/15/24  Cleave Curling, MD  thiamine  100 MG tablet Take 100 mg by mouth daily.    [provider]  triamcinolone  cream (KENALOG ) 0.1 % APPLY TO AFFECTED AREA TWICE DAILY AS NEEDED Patient taking differently: Apply 1 Application topically 2 (two) times daily as needed (for flares). 04/16/21   Cleave Curling, MD  vitamin C  (ASCORBIC ACID ) 500 MG tablet Take 500 mg by mouth every other day. Patient not taking: Reported on 05/22/2023    [provider]      Allergies    Exelon  [rivastigmine ], Fish allergy, and Codeine    Review of Systems   Review of Systems  Unable to perform ROS: Dementia  Constitutional:  Negative for fever.  HENT:  Negative for facial swelling.   Eyes:  Negative for redness.  Respiratory:  Negative for wheezing and stridor.  Gastrointestinal:  Negative for vomiting.  Genitourinary:  Negative for bladder incontinence.  Neurological:  Negative for seizures and weakness.  Psychiatric/Behavioral:  Negative for confusion.     Physical Exam Updated Vital Signs BP (!) 156/69 (BP Location: Right Arm)   Pulse 71   Temp 97.6 F (36.4 C) (Oral)   Resp 18   SpO2 100%  Physical Exam Vitals and nursing note reviewed.  Constitutional:      General: She is not in acute distress.    Appearance: Normal appearance.  She is well-developed.  HENT:     Head: Normocephalic and atraumatic.     Nose: Nose normal.     Mouth/Throat:     Mouth: Mucous membranes are moist.     Pharynx: Oropharynx is clear.  Eyes:     Pupils: Pupils are equal, round, and reactive to light.  Cardiovascular:     Rate and Rhythm: Normal rate and regular rhythm.     Pulses: Normal pulses.     Heart sounds: Normal heart sounds.  Pulmonary:     Effort: Pulmonary effort is normal. No respiratory distress.     Breath sounds: Normal breath sounds. No wheezing or rales.  Abdominal:     General: Bowel sounds are normal. There is no distension.     Palpations: Abdomen is soft.     Tenderness: There is no abdominal tenderness. There is no guarding or rebound.  Musculoskeletal:        General: Normal range of motion.     Cervical back: Normal range of motion and neck supple.  Skin:    General: Skin is warm and dry.     Capillary Refill: Capillary refill takes less than 2 seconds.     Findings: No erythema or rash.  Neurological:     General: No focal deficit present.     Mental Status: She is alert and oriented to person, place, and time.     Deep Tendon Reflexes: Reflexes normal.  Psychiatric:        Mood and Affect: Mood normal.     ED Results / Procedures / Treatments   Labs (all labs ordered are listed, but only abnormal results are displayed) Results for orders placed or performed during the hospital encounter of 05/30/23  Urinalysis, Routine w reflex microscopic -Urine, Clean Catch   Collection Time: 05/30/23 10:49 PM  Result Value Ref Range   Color, Urine STRAW (A) YELLOW   APPearance CLEAR CLEAR   Specific Gravity, Urine 1.004 (L) 1.005 - 1.030   pH 6.0 5.0 - 8.0   Glucose, UA NEGATIVE NEGATIVE mg/dL   Hgb urine dipstick NEGATIVE NEGATIVE   Bilirubin Urine NEGATIVE NEGATIVE   Ketones, ur NEGATIVE NEGATIVE mg/dL   Protein, ur NEGATIVE NEGATIVE mg/dL   Nitrite NEGATIVE NEGATIVE   Leukocytes,Ua NEGATIVE NEGATIVE   Basic metabolic panel   Collection Time: 05/30/23 10:56 PM  Result Value Ref Range   Sodium 137 135 - 145 mmol/L   Potassium 4.2 3.5 - 5.1 mmol/L   Chloride 107 98 - 111 mmol/L   CO2 21 (L) 22 - 32 mmol/L   Glucose, Bld 102 (H) 70 - 99 mg/dL   BUN 27 (H) 8 - 23 mg/dL   Creatinine, Ser 6.57 (H) 0.44 - 1.00 mg/dL   Calcium 9.2 8.9 - 84.6 mg/dL   GFR, Estimated 32 (L) >60 mL/min   Anion gap 9 5 - 15  CBC with Differential   Collection Time: 05/30/23 10:56 PM  Result  Value Ref Range   WBC 4.0 4.0 - 10.5 K/uL   RBC 4.12 3.87 - 5.11 MIL/uL   Hemoglobin 11.9 (L) 12.0 - 15.0 g/dL   HCT 01.0 27.2 - 53.6 %   MCV 88.8 80.0 - 100.0 fL   MCH 28.9 26.0 - 34.0 pg   MCHC 32.5 30.0 - 36.0 g/dL   RDW 64.4 03.4 - 74.2 %   Platelets 207 150 - 400 K/uL   nRBC 0.0 0.0 - 0.2 %   Neutrophils Relative % 54 %   Neutro Abs 2.2 1.7 - 7.7 K/uL   Lymphocytes Relative 28 %   Lymphs Abs 1.1 0.7 - 4.0 K/uL   Monocytes Relative 14 %   Monocytes Absolute 0.6 0.1 - 1.0 K/uL   Eosinophils Relative 2 %   Eosinophils Absolute 0.1 0.0 - 0.5 K/uL   Basophils Relative 1 %   Basophils Absolute 0.1 0.0 - 0.1 K/uL   Immature Granulocytes 1 %   Abs Immature Granulocytes 0.02 0.00 - 0.07 K/uL   DG Chest Portable 1 View Result Date: 05/31/2023 CLINICAL DATA:  Altered mental status. EXAM: PORTABLE CHEST 1 VIEW COMPARISON:  Anselm Aumiller 24, 2025 FINDINGS: The heart size and mediastinal contours are within normal limits. There is moderate severity calcification of the aortic arch. A stable 2 mm right upper lobe calcified granuloma is noted. Both lungs are otherwise clear. Multilevel degenerative changes are seen throughout the thoracic spine. IMPRESSION: No active disease. Electronically Signed   By: Virgle Grime M.D.   On: 05/31/2023 01:20   CT Head Wo Contrast Result Date: 05/31/2023 CLINICAL DATA:  Altered mental status. EXAM: CT HEAD WITHOUT CONTRAST TECHNIQUE: Contiguous axial images were obtained from the base of the  skull through the vertex without intravenous contrast. RADIATION DOSE REDUCTION: This exam was performed according to the departmental dose-optimization program which includes automated exposure control, adjustment of the mA and/or kV according to patient size and/or use of iterative reconstruction technique. COMPARISON:  November 02, 2021 FINDINGS: Brain: There is generalized cerebral atrophy with widening of the extra-axial spaces and ventricular dilatation. There are areas of decreased attenuation within the white matter tracts of the supratentorial brain, consistent with microvascular disease changes. Vascular: Marked severity bilateral cavernous carotid artery calcification is noted. Skull: Normal. Negative for fracture or focal lesion. Sinuses/Orbits: No acute finding. Other: None. IMPRESSION: 1. Generalized cerebral atrophy and microvascular disease changes of the supratentorial brain. 2. No acute intracranial abnormality. Electronically Signed   By: Virgle Grime M.D.   On: 05/31/2023 01:17   DG Chest Port 1 View Result Date: 05/15/2023 CLINICAL DATA:  Weakness EXAM: PORTABLE CHEST 1 VIEW COMPARISON:  November 03, 2023 FINDINGS: The heart size and mediastinal contours are within normal limits. Both lungs are clear. The visualized skeletal structures are unremarkable. No change calcified 2 mm granuloma right upper lobe IMPRESSION: No active disease. Electronically Signed   By: Fredrich Jefferson M.D.   On: 05/15/2023 15:26     EKG None  Radiology DG Chest Portable 1 View Result Date: 05/31/2023 CLINICAL DATA:  Altered mental status. EXAM: PORTABLE CHEST 1 VIEW COMPARISON:  Mavryk Pino 24, 2025 FINDINGS: The heart size and mediastinal contours are within normal limits. There is moderate severity calcification of the aortic arch. A stable 2 mm right upper lobe calcified granuloma is noted. Both lungs are otherwise clear. Multilevel degenerative changes are seen throughout the thoracic spine. IMPRESSION: No  active disease. Electronically Signed   By: Elizabeth Gulling.D.  On: 05/31/2023 01:20   CT Head Wo Contrast Result Date: 05/31/2023 CLINICAL DATA:  Altered mental status. EXAM: CT HEAD WITHOUT CONTRAST TECHNIQUE: Contiguous axial images were obtained from the base of the skull through the vertex without intravenous contrast. RADIATION DOSE REDUCTION: This exam was performed according to the departmental dose-optimization program which includes automated exposure control, adjustment of the mA and/or kV according to patient size and/or use of iterative reconstruction technique. COMPARISON:  November 02, 2021 FINDINGS: Brain: There is generalized cerebral atrophy with widening of the extra-axial spaces and ventricular dilatation. There are areas of decreased attenuation within the white matter tracts of the supratentorial brain, consistent with microvascular disease changes. Vascular: Marked severity bilateral cavernous carotid artery calcification is noted. Skull: Normal. Negative for fracture or focal lesion. Sinuses/Orbits: No acute finding. Other: None. IMPRESSION: 1. Generalized cerebral atrophy and microvascular disease changes of the supratentorial brain. 2. No acute intracranial abnormality. Electronically Signed   By: Virgle Grime M.D.   On: 05/31/2023 01:17    Procedures Procedures    Medications Ordered in ED Medications - No data to display  ED Course/ Medical Decision Making/ A&P                                 Medical Decision Making Patient who is less communicative and family is concerned for UTI as she had one last time this happened is not on meds for dementia, but is on Parkinson medication   Amount and/or Complexity of Data Reviewed Independent Historian:     Details: Daughter see above  External Data Reviewed: labs and notes.    Details: Previous notes reviewed  Labs: ordered.    Details: Urine is negative for UTI.  Normal white count 4, hemoglobin slight low 11.9  stable, normal platelets.  Normal sodium 137, normal potassium 4.2, creatinine 1.52 is stable and at baseline  Radiology: ordered and independent interpretation performed.    Details: Normal CXR   Risk Risk Details: Patient is well appearing.  Exam and vitals and labs and imaging are benign. I suspect this is worsening of patient's known dementia that is not currently on therapy for.  I have advised follow up with your neurologist. Stable for discharge.      Final Clinical Impression(s) / ED Diagnoses Final diagnoses:  Cognitive impairment  Parkinson's disease without dyskinesia, with fluctuating manifestations (HCC)   No signs of systemic illness or infection. The patient is nontoxic-appearing on exam and vital signs are within normal limits.  I have reviewed the triage vital signs and the nursing notes. Pertinent labs & imaging results that were available during my care of the patient were reviewed by me and considered in my medical decision making (see chart for details). After history, exam, and medical workup I feel the patient has been appropriately medically screened and is safe for discharge home. Pertinent diagnoses were discussed with the patient. Patient was given return precautions.      Rx / DC Orders ED Discharge Orders     None         Isidra Mings, MD 05/31/23 [phone]

## 2023-06-02 ENCOUNTER — Telehealth: Payer: Self-pay | Admitting: Neurology

## 2023-06-02 DIAGNOSIS — R4189 Other symptoms and signs involving cognitive functions and awareness: Secondary | ICD-10-CM

## 2023-06-02 DIAGNOSIS — G20C Parkinsonism, unspecified: Secondary | ICD-10-CM

## 2023-06-02 MED ORDER — DONEPEZIL HCL 5 MG PO TABS: 5.0000 mg | ORAL_TABLET | Freq: Every day | ORAL | 6 refills | Status: DC

## 2023-06-02 NOTE — Telephone Encounter (Signed)
 I called and talked with her daughter Dola Frei, she was treated at emergency room twice over the past couple weeks, once for UTI end of April 2025, second time on May 31, 2023 for increased confusion for altered mental status  Daughter complains patient has increased confusion visual hallucination, delusional idea" thinking him hurting her", this has been very bothersome for her daughter, her main caregiver  Previously tried Aricept , causing GI side effect, weight loss, Exelon  patch causing rash, daughter would like to try lower dose of Aricept , new prescription called in 5 mg daily, Sinemet  25/100 mg 3 times daily has helped her walking  CT head without contrast knee pain showed generalized atrophy small vessel disease no acute abnormality  Repeat UA showed no active infection

## 2023-06-02 NOTE — Telephone Encounter (Signed)
 Pt daughter Dola Frei ) called stating Pt went to hospital on Friday . Pt PCP recommend for Pt to speak with her neurologist after visit . Daughter would like a call at [phone]

## 2023-06-27 ENCOUNTER — Other Ambulatory Visit: Payer: Self-pay

## 2023-06-27 NOTE — Patient Instructions (Signed)
 Visit Information  Thank you for taking time to visit with me today. Please don't hesitate to contact me if I can be of assistance to you before our next scheduled appointment.  Your next care management appointment is by telephone on Monday, July 7 at 2:30 PM  Please call the care guide team at [phone] if you need to cancel, schedule, or reschedule an appointment.   Please call [phone]-TALK (toll free, 24 hour hotline) if you are experiencing a Mental Health or Behavioral Health Crisis or need someone to talk to.  Louanne Roussel RN BSN CCM [place]  [facility], [facility] Health Nurse Care Coordinator  Direct Dial: [phone] Website: [email or web address]

## 2023-06-27 NOTE — Patient Outreach (Signed)
 Complex Care Management   Visit Note  06/27/2023  Name:  Whitney Henderson MRN: [id number] DOB: 1928-11-06  Situation: Referral received for Complex Care Management related to Parkinsonism, Mild Cognitive Dementia, Hallucinations, recurrent UTI, Falls.  I obtained verbal consent from Patient.  Visit completed with patient on the phone.  Background:   Past Medical History:  Diagnosis Date   Anxiety    Cataracts, bilateral    Discoid lupus    Headache 04/27/2015   Left>right periorbital   Hypertension    Memory difficulties 09/04/2016    Assessment: Patient Reported Symptoms:  Cognitive Cognitive Status: Alert and oriented to person, place, and time, Able to follow simple commands, Normal speech and language skills, Struggling with memory recall, Other: (hallucinations) Cognitive/Intellectual Conditions Management [RPT]: Other Other: Parkinson's disease, Cognitive Impairment; hallucinations   Health Maintenance Behaviors: Annual physical exam Health Facilitated by: Rest, Healthy diet  Neurological Neurological Review of Symptoms: Other: [place] Neurological Symptoms/Conditions [RPT]: tremors, impaired gait, hallucinations, mild cognitive impairment Neurological Conditions: Parkinson's disease, Dementia Neurological Management Strategies: Medical device, Medication therapy, Routine screening Neurological Self-Management Outcome: 3 (uncertain)  HEENT HEENT Symptoms Reported: No symptoms reported      Cardiovascular Cardiovascular Symptoms Reported: No symptoms reported    Respiratory Respiratory Symptoms Reported: No symptoms reported    Endocrine Patient reports the following symptoms related to hypoglycemia or hyperglycemia : Not assessed    Gastrointestinal Gastrointestinal Symptoms Reported: Not assessed      Genitourinary Genitourinary Symptoms Reported: Incontinence, Frequency Genitourinary Conditions: Chronic kidney disease, Urinary tract infection Genitourinary Management  Strategies: Fluid modification Genitourinary Self-Management Outcome: 3 (uncertain)  Integumentary Integumentary Symptoms Reported: Skin changes Additional Integumentary Details: changes noted to sacrum, no open area's, education provided to patient and daughter's [place] Skin Conditions: Pressure injury Skin Management Strategies: Routine screening Skin Self-Management Outcome: 3 (uncertain)  Musculoskeletal Musculoskelatal Symptoms Reviewed: Difficulty walking, Unsteady gait, Weakness Musculoskeletal Conditions: Mobility limited, Unsteady gait Musculoskeletal Management Strategies: Medical device, Medication therapy, Routine screening Musculoskeletal Self-Management Outcome: 3 (uncertain) Falls in the past year?: Yes Number of falls in past year: 1 or less Was there an injury with Fall?: Yes Fall Risk Category Calculator: 2 Patient Fall Risk Level: Moderate Fall Risk Patient at Risk for Falls Due to: History of fall(s), Impaired balance/gait, Impaired mobility, Mental status change Fall risk Follow up: Falls prevention discussed, Education provided, Falls evaluation completed  Psychosocial Psychosocial Symptoms Reported: No symptoms reported Additional Psychological Details: hallucinations Behavioral Health Conditions: Dementia, Other (Parkinson's disease) Behavioral Management Strategies: Medication therapy, Support system Behavioral Health Self-Management Outcome: 3 (uncertain) Major Change/Loss/Stressor/Fears (CP): Medical condition, self Quality of Family Relationships: supportive, involved, helpful      05/26/2023    4:44 PM  Depression screen PHQ 2/9  Decreased Interest 0  Down, Depressed, Hopeless 0  PHQ - 2 Score 0    There were no vitals filed for this visit.  Medications Reviewed Today     Reviewed by Kaylene Pascal, RN (Registered Nurse) on 06/27/23 at 1555  Med List Status: <None>   Medication Order Taking? Sig Documenting Provider Last Dose Status  Informant  acetaminophen  (TYLENOL ) 500 MG tablet [id number]  Take 500 mg by mouth every 6 (six) hours as needed for mild pain or moderate pain. [provider]  Active Child, Self  amLODipine  (NORVASC ) 2.5 MG tablet [id number]  TAKE 1 TABLET(2.5 MG) BY MOUTH TWICE DAILY WITH BREAKFAST AND [name], MD  Active   Apoaequorin (PREVAGEN PO) [id number]  Take 1  capsule by mouth every other day. [provider]  Active Child, Self  aspirin  81 MG chewable tablet [id number]  Chew 1 tablet (81 mg total) by mouth 2 (two) times daily.  Patient not taking: Reported on 05/26/2023   Arnie Lao, MD  Active   b complex vitamins capsule [id number]  Take 1 capsule by mouth every other day. [provider]  Active   betamethasone dipropionate (DIPROLENE) 0.05 % cream [id number]  APPLY THIN LAYER EXTERNALLY TO THE AFFECTED AREA EVERY DAY AS NEEDED  Patient not taking: Reported on 05/26/2023   Susanna Epley, FNP  Active Child, Self  Calcium Carb-Cholecalciferol (CALCIUM 500 +D PO) [id number]  Take 1 tablet by mouth every other day. [provider]  Active Child, Self  carbidopa -levodopa  (SINEMET  IR) 25-100 MG tablet [id number] Yes Take 1 tablet by mouth 3 (three) times daily. Wess Hammed, NP Taking Active   donepezil  (ARICEPT ) 5 MG tablet [id number] Yes Take 1 tablet (5 mg total) by mouth at bedtime. Phebe Brasil, MD Taking Active   Multiple Vitamins-Minerals (CENTRUM SILVER PO) [id number]  Take 1 tablet by mouth every other day. [provider]  Active Child, Self  Propylene Glycol (SYSTANE BALANCE OP) [id number]  Place 1 drop into both eyes daily as needed (for itchy eyes). [provider]  Active Child, Self  Romosozumab -aqqg (EVENITY ) 105 MG/1.17ML SOSY injection [id number]  Inject 210 mg into the skin once. Evenity  210mg  monthly x 12 doses. Patient has one remaining doses. [provider]  Active   sertraline  (ZOLOFT ) 50 MG tablet [id number]   Take 1 tablet (50 mg total) by mouth daily. Cleave Curling, MD  Active   thiamine  100 MG tablet [id number]  Take 100 mg by mouth daily. [provider]  Active Child, Self  triamcinolone  cream (KENALOG ) 0.1 % [id number]  APPLY TO AFFECTED AREA TWICE DAILY AS NEEDED  Patient taking differently: Apply 1 Application topically 2 (two) times daily as needed (for flares).   Cleave Curling, MD  Active Child, Self  vitamin C  (ASCORBIC ACID ) 500 MG tablet [id number]  Take 500 mg by mouth every other day.  Patient not taking: Reported on 05/22/2023   [provider]  Active Child, Self            Recommendation:   Acute PCP follow-up for post ED follow up .  Follow Up Plan:   Telephone follow up appointment date/time:  Monday, July 7 at 2:30 PM  Louanne Roussel RN BSN CCM [place]  [facility], [facility] Health Nurse Care Coordinator  Direct Dial: [phone] Website: [email or web address]

## 2023-07-03 ENCOUNTER — Ambulatory Visit (INDEPENDENT_AMBULATORY_CARE_PROVIDER_SITE_OTHER): Admitting: *Deleted

## 2023-07-03 VITALS — BP 146/83 | HR 65 | Temp 98.3°F | Resp 14 | Ht 62.0 in | Wt 110.2 lb

## 2023-07-03 DIAGNOSIS — M8000XA Age-related osteoporosis with current pathological fracture, unspecified site, initial encounter for fracture: Secondary | ICD-10-CM

## 2023-07-03 DIAGNOSIS — S72002D Fracture of unspecified part of neck of left femur, subsequent encounter for closed fracture with routine healing: Secondary | ICD-10-CM | POA: Diagnosis not present

## 2023-07-03 DIAGNOSIS — M8000XD Age-related osteoporosis with current pathological fracture, unspecified site, subsequent encounter for fracture with routine healing: Secondary | ICD-10-CM

## 2023-07-03 MED ORDER — ROMOSOZUMAB-AQQG 105 MG/1.17ML ~~LOC~~ SOSY
210.0000 mg | PREFILLED_SYRINGE | Freq: Once | SUBCUTANEOUS | Status: AC
  Administered 2023-07-03: 210 mg via SUBCUTANEOUS
  Filled 2023-07-03: qty 2.34

## 2023-07-03 NOTE — Progress Notes (Signed)
 Diagnosis: Osteoporosis  Provider:  Mannam, Praveen MD  Procedure: Injection  Evenity  (Romosozumab -aqqg), Dose: 210 mg, Site: subcutaneous, Number of injections: 2  Injection Site(s): Left lower quad. abdomen  Post Care: Patient declined observation  Discharge: Condition: Good, Destination: Home . AVS Provided  Performed by:  Devonne Folk, RN

## 2023-07-28 ENCOUNTER — Other Ambulatory Visit: Payer: Self-pay

## 2023-07-28 NOTE — Patient Outreach (Signed)
 Complex Care Management   Visit Note  07/28/2023  Name:  Whitney Henderson MRN: [id number] DOB: [birth year]  Situation: Referral received for Complex Care Management related to  Parkinsonism, Mild Cognitive Dementia, Hallucinations, recurrent UTI, Falls. I obtained verbal consent from Patient.  Visit completed with patient and daughter [name] on the phone.  Background:   Past Medical History:  Diagnosis Date   Anxiety    Cataracts, bilateral    Discoid lupus    Headache 04/27/2015   Left>right periorbital   Hypertension    Memory difficulties 09/04/2016    Assessment: Patient Reported Symptoms:  Cognitive Cognitive Status: Able to follow simple commands, Alert and oriented to person, place, and time, Requires Assistance Decision Making, Other: (hallucinations) Cognitive/Intellectual Conditions Management [RPT]: Other Other: Parkinson's disease   Health Maintenance Behaviors: Annual physical exam, Stress management Health Facilitated by: Stress management  Neurological Neurological Review of Symptoms: Dizziness Neurological Management Strategies: Medical device, Medication therapy, Routine screening Neurological Self-Management Outcome: 3 (uncertain)  HEENT HEENT Symptoms Reported: No symptoms reported      Cardiovascular Cardiovascular Symptoms Reported: No symptoms reported    Respiratory Respiratory Symptoms Reported: No symptoms reported    Endocrine Endocrine Symptoms Reported: No symptoms reported    Gastrointestinal Gastrointestinal Symptoms Reported: Incontinence Gastrointestinal Management Strategies: Incontinence garment/pad Gastrointestinal Self-Management Outcome: 3 (uncertain)    Genitourinary Genitourinary Symptoms Reported: Incontinence Genitourinary Management Strategies: Incontinence garment/pad Genitourinary Self-Management Outcome: 4 (good)  Integumentary Integumentary Symptoms Reported: No symptoms reported    Musculoskeletal Musculoskelatal Symptoms  Reviewed: Limited mobility, Unsteady gait Musculoskeletal Management Strategies: Medical device, Routine screening, Medication therapy Musculoskeletal Self-Management Outcome: 3 (uncertain) Falls in the past year?: Yes Number of falls in past year: 2 or more Was there an injury with Fall?: No Fall Risk Category Calculator: 2 Patient Fall Risk Level: Moderate Fall Risk Patient at Risk for Falls Due to: History of fall(s), Impaired balance/gait, Impaired mobility Fall risk Follow up: Education provided, Falls evaluation completed, Falls prevention discussed  Psychosocial Psychosocial Symptoms Reported: Nightmares, Other Other Psychosocial Conditions: Parkinson's induced hallucinations Behavioral Management Strategies: Medication therapy, Support system Behavioral Health Self-Management Outcome: 3 (uncertain) Major Change/Loss/Stressor/Fears (CP): Medical condition, self Techniques to Cope with Loss/Stress/Change: Medication, Diversional activities Quality of Family Relationships: involved, helpful, supportive      05/26/2023    4:44 PM  Depression screen PHQ 2/9  Decreased Interest 0  Down, Depressed, Hopeless 0  PHQ - 2 Score 0    There were no vitals filed for this visit.  Medications Reviewed Today     Reviewed by Morgan Clayborne CROME, RN (Registered Nurse) on 07/28/23 at 1427  Med List Status: <None>   Medication Order Taking? Sig Documenting Provider Last Dose Status Informant  acetaminophen  (TYLENOL ) 500 MG tablet [id number] No Take 500 mg by mouth every 6 (six) hours as needed for mild pain or moderate pain. [provider] Taking Active Child, Self  amLODipine  (NORVASC ) 2.5 MG tablet [id number] No TAKE 1 TABLET(2.5 MG) BY MOUTH TWICE DAILY WITH BREAKFAST AND JOAQUIN Jarold Medici, MD Taking Active   Apoaequorin (PREVAGEN PO) [id number] No Take 1 capsule by mouth every other day. [provider] Taking Active Child, Self  aspirin  81 MG chewable tablet [id number] No  Chew 1 tablet (81 mg total) by mouth 2 (two) times daily.  Patient not taking: Reported on 05/26/2023   Vernetta Lonni GRADE, MD Not Taking Active   b complex vitamins capsule [id number] No Take 1 capsule by mouth every other day. [provider] Taking Active   betamethasone dipropionate (DIPROLENE) 0.05 % cream [id number] No APPLY THIN LAYER EXTERNALLY TO THE AFFECTED AREA EVERY DAY AS NEEDED  Patient not taking: Reported on 05/26/2023   Georgina Speaks, FNP Not Taking Active Child, Self  Calcium Carb-Cholecalciferol (CALCIUM 500 +D PO) [id number] No Take 1 tablet by mouth every other day. [provider] Taking Active Child, Self  carbidopa -levodopa  (SINEMET  IR) 25-100 MG tablet [id number] No Take 1 tablet by mouth 3 (three) times daily. Gayland Lauraine PARAS, NP Taking Active   donepezil  (ARICEPT ) 5 MG tablet [id number] No Take 1 tablet (5 mg total) by mouth at bedtime. Onita Duos, MD Taking Active   Multiple Vitamins-Minerals (CENTRUM SILVER PO) [id number] No Take 1 tablet by mouth every other day. [provider] Taking Active Child, Self  Propylene Glycol (SYSTANE BALANCE OP) [id number] No Place 1 drop into both eyes daily as needed (for itchy eyes). [provider] Taking Active Child, Self  Romosozumab -aqqg (EVENITY ) 105 MG/1. SOSY injection [id number] No Inject 210 mg into the skin once. Evenity  210mg  monthly x 12 doses. Patient has one remaining doses. [provider] Taking Active   sertraline  (ZOLOFT ) 50 MG tablet [id number] No Take 1 tablet (50 mg total) by mouth daily. Jarold Medici, MD Taking Active   thiamine  100 MG tablet [id number] No Take 100 mg by mouth daily. [provider] Taking Active Child, Self  triamcinolone  cream (KENALOG ) 0.1 % [id number] No APPLY TO AFFECTED AREA TWICE DAILY AS NEEDED  Patient taking differently: Apply 1 Application topically 2 (two) times daily as needed (for flares).   Jarold Medici, MD Taking Active Child,  Self  vitamin C  (ASCORBIC ACID ) 500 MG tablet [id number] No Take 500 mg by mouth every other day.  Patient not taking: Reported on 05/22/2023   [provider] Not Taking Active Child, Self            Recommendation:   PCP Follow-up on 08/20/23 at 11:40 AM Specialty provider follow-up Dr. Onita, Neurologist on 09/08/23 at 11:30 AM  Follow Up Plan:   Telephone follow up appointment date/time:  Thursday, August 7 at 2:30 PM  Clayborne Ly RN BSN CCM [place]  [facility], [facility] Health Nurse Care Coordinator  Direct Dial: [phone] Website: [email or web address]

## 2023-07-29 NOTE — Patient Instructions (Signed)
 Visit Information  Thank you for taking time to visit with me today. Please don't hesitate to contact me if I can be of assistance to you before our next scheduled appointment.  Your next care management appointment is by telephone on Thursday, August 7 at 2:30 PM  Please call the care guide team at [phone] if you need to cancel, schedule, or reschedule an appointment.   Please call [phone]-TALK (toll free, 24 hour hotline) if you are experiencing a Mental Health or Behavioral Health Crisis or need someone to talk to.  Clayborne Ly RN BSN CCM [place]  [facility], [facility] Health Nurse Care Coordinator  Direct Dial: [phone] Website: [email or web address]

## 2023-08-13 ENCOUNTER — Telehealth: Payer: Self-pay | Admitting: Neurology

## 2023-08-13 MED ORDER — QUETIAPINE FUMARATE 25 MG PO TABS: 25.0000 mg | ORAL_TABLET | Freq: Every day | ORAL | 5 refills | Status: DC

## 2023-08-13 NOTE — Patient Outreach (Signed)
 Placed successful outbound call to patient and daughter Astoria Condon to advise of the following recommendation per Dr. Modena Callander.    Agree low dose of seroquel  25mg  every night, move zoloft  50mg  to every morning, if not sure about benefit of zoloft ,  may consider lower dose such as 25mg  every morning, and gradually taper off. Dr. Modena Callander  Patient and daughter [name] verbalize understanding of the instructions provided. Discussed plans with patient for ongoing nurse care management follow up and provided patient with direct contact information for nurse case management.  Replied to Dr. Callander requesting an Rx for Seroquel  be sent to the patient's preferred pharmacy. Forwarded Dr. Georgianne message to PCP provider, Dr. Catheryn Slocumb for continuity of care.   Clayborne Ly RN BSN CCM [place]  [facility], [facility] Health Nurse Care Coordinator  Direct Dial: [phone] Website: [email or web address]

## 2023-08-13 NOTE — Telephone Encounter (Signed)
 Little, Clayborne CROME, RN  Onita Duos, MD Hello Dr. Onita,  I spoke with patient's daughter [name] this morning and she would like to go ahead and start the Seroquel  as soon as possible. Can you please send an Rx to the pharmacy listed? Thanks so much!!  Warmly, Clayborne Ly RN BSN CCM [place]  [facility], [facility] Health Nurse Care Coordinator Direct Dial: [phone] Website: [email or web address]   Little, Clayborne CROME, RN  Onita Duos, MD Hello Dr. Onita,  I spoke with patient's daughter [name] this morning and she would like to go ahead and start the Seroquel  as soon as possible. Can you please send an Rx to the pharmacy listed? Thanks so much!!  Warmly, Clayborne Ly RN BSN CCM [place]  [facility], [facility] Health Nurse Care Coordinator Direct Dial: [phone] Website: [email or web address]               Please call patient, I have sent in low-dose Seroquel  prescription at night, she is also on Zoloft  50 mg every day, please advise her daughter change her Zoloft  to every morning, if not sure about the benefit of Zoloft , may consider only half tablet every morning,  Follow-up with me on August 18 is planned  Meds ordered this encounter  Medications   QUEtiapine  (SEROQUEL ) 25 MG tablet    Sig: Take 1 tablet (25 mg total) by mouth at bedtime.    Dispense:  30 tablet    Refill:  5

## 2023-08-13 NOTE — Telephone Encounter (Signed)
-----   Message from Nurse Clayborne CROME sent at 08/13/2023  9:28 AM EDT ----- Regarding: RE: Re: patient update Hello Dr. Onita,   I spoke with patient's daughter [name] this morning and she would like to go ahead and start the Seroquel  as soon as possible. Can you please send an Rx to the pharmacy listed? Thanks so much!!  Warmly, Clayborne Ly RN BSN CCM [place]  [facility], [facility] Health Nurse Care Coordinator  Direct Dial: [phone] Website: [email or web address] ----- Message ----- From: Onita Duos, MD Sent: 08/11/2023   5:29 PM EDT To: Clayborne CROME Ly, RN Subject: RE: Re: patient update                         Angel:  Thanks for reaching out.  Agree low dose of seroquel  25mg  every night, move zoloft  50mg  to every morning, if not sure about benefit of zoloft ,  may consider lower dose such as 25mg  every morning, and gradually taper off.  Julicia Krieger ----- Message ----- From: Ly Clayborne CROME, RN Sent: 07/29/2023   4:57 PM EDT To: Duos Onita, MD Subject: Re: patient update                             Hello Dr. Onita,  I am a nurse care manager from the [facility] population health team. I wanted to update you about Ms. Mullet's reported symptoms of worsening hallucinations and more frequent falls. She is having more frequent hallucinations as well as what sounds like hallucinatory nightmares. She has experienced at least 2 falls over the past 2 weeks but did not seek medical attention. She is not using her walker with ambulation in her home. I have encouraged her to use her walker at all times while ambulating and to consider using a night light. She is interested in trying Seroquel  at nighttime but is also currently taking Sertraline  50 mg daily as prescribed. Do you have any recommendations that I can share with her and her daughter [name]?   Warmly, Clayborne Ly OBIE BETHANN CCM [place]  [facility], [facility] Health Nurse Care Coordinator   Direct Dial: [phone] Website: [email or web address]

## 2023-08-19 ENCOUNTER — Encounter: Payer: Self-pay | Admitting: Podiatry

## 2023-08-19 ENCOUNTER — Ambulatory Visit: Admitting: Podiatry

## 2023-08-19 DIAGNOSIS — B351 Tinea unguium: Secondary | ICD-10-CM | POA: Diagnosis not present

## 2023-08-19 DIAGNOSIS — M79675 Pain in left toe(s): Secondary | ICD-10-CM | POA: Diagnosis not present

## 2023-08-19 DIAGNOSIS — L89619 Pressure ulcer of right heel, unspecified stage: Secondary | ICD-10-CM | POA: Diagnosis not present

## 2023-08-19 DIAGNOSIS — M79674 Pain in right toe(s): Secondary | ICD-10-CM | POA: Diagnosis not present

## 2023-08-20 ENCOUNTER — Ambulatory Visit: Payer: Medicare Other | Admitting: Internal Medicine

## 2023-08-21 ENCOUNTER — Encounter: Payer: Self-pay | Admitting: Podiatry

## 2023-08-21 NOTE — Progress Notes (Signed)
  Subjective:  Patient ID: Whitney Henderson, female    DOB: Oct 16, 1928,  MRN: [id number]  Whitney Henderson presents to clinic today for at risk foot care with h/o CKD and callus(es) right lower extremity and painful mycotic toenails that are difficult to trim. Painful toenails interfere with ambulation. Aggravating factors include wearing enclosed shoe gear. Pain is relieved with periodic professional debridement. Painful calluses are aggravated when weightbearing with and without shoegear. Pain is relieved with periodic professional debridement.  Chief Complaint  Patient presents with   RFC    Rm17 routine foot care/Dr. Jarold last visit Jan. 2025   New problem(s): None.   PCP is Jarold Medici, MD.  Allergies  Allergen Reactions   Exelon  [Rivastigmine ] Itching and Rash   Fish Allergy Other (See Comments)    Does not do well with Seafood.   Codeine Rash    Review of Systems: Negative except as noted in the HPI.  Objective: No changes noted in today's physical examination. There were no vitals filed for this visit. Whitney Henderson is a pleasant 89 y.o. female thin build in NAD. AAO x 3.  Vascular Examination: CFT <3 seconds b/l. DP/PT pulses faintly palpable b/l. Digital hair absent. No edema b/l. Skin temperature gradient warm to warm b/l. No pain with calf compression. No ischemia or gangrene. No cyanosis or clubbing noted b/l.    Neurological Examination: Sensation grossly intact b/l with 10 gram monofilament. Vibratory sensation intact b/l.   Dermatological Examination: Pedal skin warm and supple b/l.   No open wounds. No interdigital macerations.  Toenails 1-5 b/l thick, discolored, elongated with subungual debris and pain on dorsal palpation.  .  Preulcerative lesion noted posterolateral aspect of right heel and posteromedial aspect of right heel. There is visible subdermal hemorrhage. There is no surrounding erythema, no edema, no drainage, no odor, no  fluctuance.  Musculoskeletal Examination: Muscle strength 4/5 to all lower extremity muscle groups bilaterally. HAV with bunion deformity noted b/l LE. Hammertoe(s) 2-5 b/l. Utilizes transport chair for ambulation assistance.  Radiographs: None Assessment/Plan: 1. Pain due to onychomycosis of toenails of both feet   2. Pressure injury of skin of right heel, unspecified injury stage    -Patient's family member present. All questions/concerns addressed on today's visit. -Consent given for treatment as described below: -Examined patient. -For pressure injury of heel(s), recommended daily use of heel protectors when patient is in bed. Demonstrated floating of heels to avoid pressure. Family related understanding. -Mycotic toenails 1-5 bilaterally were debrided in length and girth with sterile nail nippers without incident. -As a courtesy, preulcerative lesion(s) posterolateral aspect of right heel and posteromedial aspect of right heel pared without complication or incident. Total number pared=2. -Patient/POA to call should there be question/concern in the interim.   Return in about 3 months (around 11/19/2023).  Delon LITTIE Merlin, DPM      [facility] LOCATION: [address], [place] [address]                   Office [phone]   [facility] LOCATION: [address] [place], [place] [address] Office [phone]

## 2023-08-28 ENCOUNTER — Telehealth

## 2023-09-02 ENCOUNTER — Encounter: Payer: Self-pay | Admitting: Internal Medicine

## 2023-09-02 ENCOUNTER — Ambulatory Visit (INDEPENDENT_AMBULATORY_CARE_PROVIDER_SITE_OTHER): Admitting: Internal Medicine

## 2023-09-02 VITALS — BP 130/70 | HR 63 | Temp 97.7°F | Ht 62.0 in | Wt 110.2 lb

## 2023-09-02 DIAGNOSIS — G20C Parkinsonism, unspecified: Secondary | ICD-10-CM | POA: Diagnosis not present

## 2023-09-02 DIAGNOSIS — N1832 Chronic kidney disease, stage 3b: Secondary | ICD-10-CM

## 2023-09-02 DIAGNOSIS — F419 Anxiety disorder, unspecified: Secondary | ICD-10-CM | POA: Diagnosis not present

## 2023-09-02 DIAGNOSIS — Z9989 Dependence on other enabling machines and devices: Secondary | ICD-10-CM

## 2023-09-02 DIAGNOSIS — I129 Hypertensive chronic kidney disease with stage 1 through stage 4 chronic kidney disease, or unspecified chronic kidney disease: Secondary | ICD-10-CM | POA: Diagnosis not present

## 2023-09-02 DIAGNOSIS — M810 Age-related osteoporosis without current pathological fracture: Secondary | ICD-10-CM

## 2023-09-02 MED ORDER — SERTRALINE HCL 50 MG PO TABS: 50.0000 mg | ORAL_TABLET | Freq: Every day | ORAL | 1 refills | Status: AC

## 2023-09-02 MED ORDER — AMLODIPINE BESYLATE 2.5 MG PO TABS: 2.5000 mg | ORAL_TABLET | Freq: Two times a day (BID) | ORAL | 1 refills | Status: DC

## 2023-09-02 NOTE — Progress Notes (Signed)
 I,Whitney Henderson, CMA,acting as a Neurosurgeon for Whitney LOISE Slocumb, MD.,have documented all relevant documentation on the behalf of Whitney LOISE Slocumb, MD,as directed by  Whitney LOISE Slocumb, MD while in the presence of Whitney LOISE Slocumb, MD.  Subjective:  Patient ID: Whitney Henderson , female    DOB: September 07, 1928 , 89 y.o.   MRN: [id number]  Chief Complaint  Patient presents with   Hypertension    Patient presents today for bpc. She reports compliance with medications. Denies headache, chest pain & sob. She is accompanied by her daughter. She recently went to see Neurologist, she was rx new medications she has questions about.     HPI Discussed the use of AI scribe software for clinical note transcription with the patient, who gave verbal consent to proceed.  History of Present Illness Whitney Henderson is a 89 year old female with Parkinson's disease who presents with sleep disturbances and medication management.    She experiences sleep disturbances, primarily difficulty sleeping at night, characterized by 'rolling around at night' and frequent nocturnal awakenings to use the bathroom, which disrupts her sleep. She has been prescribed quetiapine  (Seroquel ) to aid with sleep, but its effectiveness is unclear.  Her medication regimen includes amlodipine  2.5 mg for blood pressure. She was previously on donepezil  (Aricept ) for memory, but it was discontinued due to weight loss concerns. She is also taking sertraline , which has been moved to morning dosing. Her Parkinson's disease is managed with carbidopa -levodopa  (Sinemet ), which she is supposed to take five times a day. However, due to her late waking time, she often only takes it twice a day. She experiences symptoms typical of Parkinson's, and there is a concern about potential hallucinations associated with the condition.  Her appetite has improved since discontinuing donepezil , and she maintains her weight with a diet that includes snacks like apples and  bananas. She drinks plenty of water , which has helped manage previous bladder infections.  She engages in chair exercises in the evening to maintain muscle strength and reduce fall risk. She is reminded to do exercises during the day while watching TV.   Hypertension This is a chronic problem. The current episode started more than 1 year ago. The problem has been gradually improving since onset. The problem is uncontrolled. Pertinent negatives include no blurred vision, chest pain, headaches, neck pain, orthopnea, palpitations or shortness of breath. Risk factors for coronary artery disease include sedentary lifestyle and post-menopausal state. The current treatment provides moderate improvement. Compliance problems include exercise.      Past Medical History:  Diagnosis Date   Anxiety    Cataracts, bilateral    Discoid lupus    Headache 04/27/2015   Left>right periorbital   Hypertension    Memory difficulties 09/04/2016     Family History  Problem Relation Age of Onset   Early death Mother    Cancer Father    Thyroid  disease Daughter    Breast cancer Daughter        40s   Glaucoma Daughter    Hypertension Daughter    Heart attack Son    Thyroid  disease Son      Current Outpatient Medications:    acetaminophen  (TYLENOL ) 500 MG tablet, Take 500 mg by mouth every 6 (six) hours as needed for mild pain or moderate pain., Disp: , Rfl:    Apoaequorin (PREVAGEN PO), Take 1 capsule by mouth every other day., Disp: , Rfl:    b complex vitamins capsule, Take 1 capsule by  mouth every other day., Disp: , Rfl:    Calcium Carb-Cholecalciferol (CALCIUM 500 +D PO), Take 1 tablet by mouth every other day., Disp: , Rfl:    carbidopa -levodopa  (SINEMET  IR) 25-100 MG tablet, Take 1 tablet by mouth 3 (three) times daily., Disp: 270 tablet, Rfl: 1   Multiple Vitamins-Minerals (CENTRUM SILVER PO), Take 1 tablet by mouth every other day., Disp: , Rfl:    Propylene Glycol (SYSTANE BALANCE OP), Place 1  drop into both eyes daily as needed (for itchy eyes)., Disp: , Rfl:    Romosozumab -aqqg (EVENITY ) 105 MG/1. SOSY injection, Inject 210 mg into the skin once. Evenity  210mg  monthly x 12 doses. Patient has one remaining doses. (Patient not taking: Reported on 09/08/2023), Disp: , Rfl:    thiamine  100 MG tablet, Take 100 mg by mouth daily., Disp: , Rfl:    triamcinolone  cream (KENALOG ) 0.1 %, APPLY TO AFFECTED AREA TWICE DAILY AS NEEDED, Disp: 45 g, Rfl: 0   vitamin C  (ASCORBIC ACID ) 500 MG tablet, Take 500 mg by mouth every other day., Disp: , Rfl:    amLODipine  (NORVASC ) 2.5 MG tablet, Take 1 tablet (2.5 mg total) by mouth in the morning and at bedtime., Disp: 180 tablet, Rfl: 1   betamethasone dipropionate (DIPROLENE) 0.05 % cream, APPLY THIN LAYER EXTERNALLY TO THE AFFECTED AREA EVERY DAY AS NEEDED, Disp: 60 g, Rfl: 2   QUEtiapine  (SEROQUEL ) 25 MG tablet, Take 1 tablet (25 mg total) by mouth at bedtime., Disp: 30 tablet, Rfl: 11   sertraline  (ZOLOFT ) 50 MG tablet, Take 1 tablet (50 mg total) by mouth daily., Disp: 90 tablet, Rfl: 1   Allergies  Allergen Reactions   Exelon  [Rivastigmine ] Itching and Rash   Fish Allergy Other (See Comments)    Does not do well with Seafood.   Codeine Rash     Review of Systems  Constitutional: Negative.   HENT: Negative.    Eyes:  Negative for blurred vision.  Respiratory: Negative.  Negative for shortness of breath.   Cardiovascular: Negative.  Negative for chest pain, palpitations and orthopnea.  Gastrointestinal: Negative.   Musculoskeletal:  Negative for neck pain.  Neurological: Negative.  Negative for headaches.  Psychiatric/Behavioral: Negative.       Today's Vitals   09/02/23 1446  BP: 130/70  Pulse: 63  Temp: 97.7 F (36.5 C)  SpO2: 98%  Weight: 110 lb 3.2 oz (50 kg)  Height: 5' 2 (1.575 m)   Body mass index is 20.16 kg/m.  Wt Readings from Last 3 Encounters:  09/08/23 110 lb (49.9 kg)  09/02/23 110 lb 3.2 oz (50 kg)  07/03/23  110 lb 3.2 oz (50 kg)     Objective:  Physical Exam Vitals and nursing note reviewed.  Constitutional:      Appearance: Normal appearance.  HENT:     Head: Normocephalic and atraumatic.  Eyes:     Extraocular Movements: Extraocular movements intact.  Cardiovascular:     Rate and Rhythm: Normal rate and regular rhythm.     Heart sounds: Normal heart sounds.  Pulmonary:     Effort: Pulmonary effort is normal.     Breath sounds: Normal breath sounds.  Musculoskeletal:     Cervical back: Normal range of motion.     Comments: Ambulatory with cane  Skin:    General: Skin is warm.  Neurological:     General: No focal deficit present.     Mental Status: She is alert.  Psychiatric:  Mood and Affect: Mood normal.        Behavior: Behavior normal.      Assessment And Plan:  Parenchymal renal hypertension, stage 1 through stage 4 or unspecified chronic kidney disease Assessment & Plan: Chronic, controlled. Goal BP <130/80.  She will c/w amlodipine  2.5mg  twice daily(she did not tolerate once daily dosing).  She is encouraged to follow low sodium diet. She will f/u in 4-6 months for re-evaluation.  Orders: -     CMP14+EGFR -     amLODIPine  Besylate; Take 1 tablet (2.5 mg total) by mouth in the morning and at bedtime.  Dispense: 180 tablet; Refill: 1  Stage 3b chronic kidney disease (HCC) Assessment & Plan: I will check labs as below. She is encouraged to stay well hydrated, avoid NSAIDs and keep BP controlled to prevent progression of CKD.    Orders: -     CMP14+EGFR  Anxiety Assessment & Plan: Stable with sertraline  adjusted to morning dosing. Mood reported as good. - Continue sertraline  with morning dosing.   Age related osteoporosis, unspecified pathological fracture presence Assessment & Plan: Chronic, she has completed one year course of Evenity . Rheumatology input is appreciated.    Parkinsonism, unspecified Parkinsonism type (HCC) Assessment & Plan: Ongoing  symptoms with sleep disturbances managed by quetiapine . Possible hallucinations discussed; differential includes Lewy body dementia. Treatment aims to prevent worsening. - Continue quetiapine  for sleep disturbances. - Follow up with neurology next Monday. - Discuss potential Lewy body dementia with neurologist.   Ambulates with cane  Other orders -     Sertraline  HCl; Take 1 tablet (50 mg total) by mouth daily.  Dispense: 90 tablet; Refill: 1   Return in 4 months (on 01/02/2024), or bp check/weight check.  Patient was given opportunity to ask questions. Patient verbalized understanding of the plan and was able to repeat key elements of the plan. All questions were answered to their satisfaction.   I, Whitney LOISE Slocumb, MD, have reviewed all documentation for this visit. The documentation on 09/02/23 for the exam, diagnosis, procedures, and orders are all accurate and complete.   IF YOU HAVE BEEN REFERRED TO A SPECIALIST, IT MAY TAKE 1-2 WEEKS TO SCHEDULE/PROCESS THE REFERRAL. IF YOU HAVE NOT HEARD FROM US /SPECIALIST IN TWO WEEKS, PLEASE GIVE US  A CALL AT [phone].   THE PATIENT IS ENCOURAGED TO PRACTICE SOCIAL DISTANCING DUE TO THE COVID-19 PANDEMIC.

## 2023-09-02 NOTE — Patient Instructions (Signed)
 Hypertension, Adult Hypertension is another name for high blood pressure. High blood pressure forces your heart to work harder to pump blood. This can cause problems over time. There are two numbers in a blood pressure reading. There is a top number (systolic) over a bottom number (diastolic). It is best to have a blood pressure that is below 120/80. What are the causes? The cause of this condition is not known. Some other conditions can lead to high blood pressure. What increases the risk? Some lifestyle factors can make you more likely to develop high blood pressure: Smoking. Not getting enough exercise or physical activity. Being overweight. Having too much fat, sugar, calories, or salt (sodium) in your diet. Drinking too much alcohol . Other risk factors include: Having any of these conditions: Heart disease. Diabetes. High cholesterol. Kidney disease. Obstructive sleep apnea. Having a family history of high blood pressure and high cholesterol. Age. The risk increases with age. Stress. What are the signs or symptoms? High blood pressure may not cause symptoms. Very high blood pressure (hypertensive crisis) may cause: Headache. Fast or uneven heartbeats (palpitations). Shortness of breath. Nosebleed. Vomiting or feeling like you may vomit (nauseous). Changes in how you see. Very bad chest pain. Feeling dizzy. Seizures. How is this treated? This condition is treated by making healthy lifestyle changes, such as: Eating healthy foods. Exercising more. Drinking less alcohol . Your doctor may prescribe medicine if lifestyle changes do not help enough and if: Your top number is above 130. Your bottom number is above 80. Your personal target blood pressure may vary. Follow these instructions at home: Eating and drinking  If told, follow the DASH eating plan. To follow this plan: Fill one half of your plate at each meal with fruits and vegetables. Fill one fourth of your plate  at each meal with whole grains. Whole grains include whole-wheat pasta, brown rice, and whole-grain bread. Eat or drink low-fat dairy products, such as skim milk or low-fat yogurt. Fill one fourth of your plate at each meal with low-fat (lean) proteins. Low-fat proteins include fish, chicken without skin, eggs, beans, and tofu. Avoid fatty meat, cured and processed meat, or chicken with skin. Avoid pre-made or processed food. Limit the amount of salt in your diet to less than 1,500 mg each day. Do not drink alcohol  if: Your doctor tells you not to drink. You are pregnant, may be pregnant, or are planning to become pregnant. If you drink alcohol : Limit how much you have to: 0-1 drink a day for women. 0-2 drinks a day for men. Know how much alcohol  is in your drink. In the [place] one drink equals one 12 oz bottle of beer (355 mL), one 5 oz glass of wine (148 mL), or one 1 oz glass of hard liquor (44 mL). Lifestyle  Work with your doctor to stay at a healthy weight or to lose weight. Ask your doctor what the best weight is for you. Get at least 30 minutes of exercise that causes your heart to beat faster (aerobic exercise) most days of the week. This may include walking, swimming, or biking. Get at least 30 minutes of exercise that strengthens your muscles (resistance exercise) at least 3 days a week. This may include lifting weights or doing Pilates. Do not smoke or use any products that contain nicotine  or tobacco. If you need help quitting, ask your doctor. Check your blood pressure at home as told by your doctor. Keep all follow-up visits. Medicines Take over-the-counter and prescription medicines  only as told by your doctor. Follow directions carefully. Do not skip doses of blood pressure medicine. The medicine does not work as well if you skip doses. Skipping doses also puts you at risk for problems. Ask your doctor about side effects or reactions to medicines that you should watch  for. Contact a doctor if: You think you are having a reaction to the medicine you are taking. You have headaches that keep coming back. You feel dizzy. You have swelling in your ankles. You have trouble with your vision. Get help right away if: You get a very bad headache. You start to feel mixed up (confused). You feel weak or numb. You feel faint. You have very bad pain in your: Chest. Belly (abdomen). You vomit more than once. You have trouble breathing. These symptoms may be an emergency. Get help right away. Call 911. Do not wait to see if the symptoms will go away. Do not drive yourself to the hospital. Summary Hypertension is another name for high blood pressure. High blood pressure forces your heart to work harder to pump blood. For most people, a normal blood pressure is less than 120/80. Making healthy choices can help lower blood pressure. If your blood pressure does not get lower with healthy choices, you may need to take medicine. This information is not intended to replace advice given to you by your health care provider. Make sure you discuss any questions you have with your health care provider. Document Revised: 10/26/2020 Document Reviewed: 10/26/2020 Elsevier Patient Education  2024 [employer].

## 2023-09-03 LAB — CMP14+EGFR
ALT: 7 IU/L (ref 0–32)
AST: 21 IU/L (ref 0–40)
Albumin: 4 g/dL (ref 3.6–4.6)
Alkaline Phosphatase: 60 IU/L (ref 44–121)
BUN/Creatinine Ratio: 19 (ref 12–28)
BUN: 28 mg/dL (ref 10–36)
Bilirubin Total: 0.3 mg/dL (ref 0.0–1.2)
CO2: 19 mmol/L — ABNORMAL LOW (ref 20–29)
Calcium: 9.6 mg/dL (ref 8.7–10.3)
Chloride: 107 mmol/L — ABNORMAL HIGH (ref 96–106)
Creatinine, Ser: 1.5 mg/dL — ABNORMAL HIGH (ref 0.57–1.00)
Globulin, Total: 3.2 g/dL (ref 1.5–4.5)
Glucose: 98 mg/dL (ref 70–99)
Potassium: 4.4 mmol/L (ref 3.5–5.2)
Sodium: 142 mmol/L (ref 134–144)
Total Protein: 7.2 g/dL (ref 6.0–8.5)
eGFR: 32 mL/min/1.73 — ABNORMAL LOW (ref >59)

## 2023-09-04 ENCOUNTER — Ambulatory Visit: Payer: Self-pay | Admitting: Internal Medicine

## 2023-09-05 NOTE — Progress Notes (Addendum)
 Office Visit Note  Patient: Whitney Henderson             Date of Birth: 1928/06/27           MRN: [id number]             PCP: Jarold Medici, MD Referring: Jarold Medici, MD Visit Date: 09/19/2023 Occupation: @GUAROCC @  Subjective:  Medication management  History of Present Illness: Whitney Henderson is a 89 y.o. female with osteoporosis.  She returns today after her last visit on March 20, 2023.  She has been on Evenity  since Jun 12, 2022.  She tolerated Evenity  well.  Her last Evenity  injection was on July 03, 2023.  Patient denies any morning stiffness or joint pain.  She has been taking calcium and vitamin D .  According to her daughter she stays active and walks most of the day with the help of a cane.  She has been also doing some light exercises with her daughter.    Activities of Daily Living:  Patient reports morning stiffness for 0 minutes.   Patient Denies nocturnal pain.  Difficulty dressing/grooming: Denies Difficulty climbing stairs: Denies Difficulty getting out of chair: Reports Difficulty using hands for taps, buttons, cutlery, and/or writing: Reports  Review of Systems  Constitutional:  Positive for fatigue.  HENT:  Negative for mouth sores and mouth dryness.   Eyes:  Negative for dryness.  Respiratory:  Negative for shortness of breath.   Cardiovascular:  Negative for chest pain and palpitations.  Gastrointestinal:  Positive for constipation and diarrhea. Negative for blood in stool.  Endocrine: Negative for increased urination.  Genitourinary:  Negative for involuntary urination.  Musculoskeletal:  Positive for gait problem. Negative for joint pain, joint pain, joint swelling, myalgias, muscle weakness, morning stiffness, muscle tenderness and myalgias.  Skin:  Positive for sensitivity to sunlight. Negative for color change, rash and hair loss.  Allergic/Immunologic: Negative for susceptible to infections.  Neurological:  Negative for dizziness and  headaches.  Hematological:  Negative for swollen glands.  Psychiatric/Behavioral:  Negative for depressed mood and sleep disturbance. The patient is not nervous/anxious.     PMFS History:  Patient Active Problem List   Diagnosis Date Noted   Severe dementia (HCC) 09/08/2023   Discoid lupus erythematosus 05/22/2023   Age related osteoporosis 02/22/2023   Parkinsonism (HCC) 09/02/2022   Visual hallucination 09/02/2022   Gait abnormality 09/02/2022   Chronic low back pain without sciatica 08/11/2022   Callus of heel 08/07/2022   Pain and swelling of lower extremity 08/07/2022   Body mass index (BMI) 22.0-22.9, adult 08/07/2022   Drug therapy 08/07/2022   Migraine 01/06/2022   MCI (mild cognitive impairment) with memory loss 01/06/2022   Personal history of tobacco use, presenting hazards to health 01/06/2022   Encounter for long-term (current) aspirin  use 01/06/2022   Urinary frequency 12/31/2021   Hip fracture requiring operative repair (HCC) 11/03/2021   Closed fracture of neck of left femur (HCC) 11/02/2021   Fall at home, initial encounter 11/02/2021   Normocytic anemia 11/02/2021   Tremor 01/06/2020   Age-related osteoporosis with current pathological fracture 11/05/2018   Grief reaction 03/31/2018   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   Chronic renal disease, stage III (HCC) 12/11/2017   Hypertension, essential, benign 10/30/2017   Anxiety 10/11/2017   Cognitive impairment 09/04/2016   Headache 04/27/2015   Hypertension 01/16/2013   Lower urinary tract infectious disease 01/16/2013    Past Medical History:  Diagnosis Date   Anxiety  Cataracts, bilateral    Dementia (HCC)    followed by neuro per patient's daughter   Discoid lupus    Headache 04/27/2015   Left>right periorbital   Hypertension    Memory difficulties 09/04/2016   Parkinson's disease (HCC)    followed by neuro per patient's daughter    Family History  Problem Relation Age of Onset    Early death Mother    Cancer Father    Thyroid  disease Daughter    Breast cancer Daughter        67s   Glaucoma Daughter    Hypertension Daughter    Heart attack Son    Thyroid  disease Son    Past Surgical History:  Procedure Laterality Date   ABDOMINAL HYSTERECTOMY     BREAST EXCISIONAL BIOPSY Left over 20 ys ago   benign   CHOLECYSTECTOMY     EXCISIONAL HEMORRHOIDECTOMY     TOTAL HIP ARTHROPLASTY Left 11/03/2021   Procedure: LEFT HEMI HIP ARTHROPLASTY ANTERIOR APPROACH;  Surgeon: Vernetta Lonni GRADE, MD;  Location: [facility] OR;  Service: Orthopedics;  Laterality: Left;   Social History   Social History Narrative   Live with daughter 4 children.    Caffeine: 1 cup daily    Right handed   Immunization History  Administered Date(s) Administered   Fluad Quad(high Dose 65+) 09/25/2018   INFLUENZA, HIGH DOSE SEASONAL PF 10/02/2016, 10/14/2019   Influenza-Unspecified 10/22/2017, 10/01/2021   PFIZER(Purple Top)SARS-COV-2 Vaccination 02/09/2019, 03/01/2019, 12/09/2019, 06/20/2020   Pneumococcal Conjugate-13 11/28/2015   Pneumococcal Polysaccharide-23 12/04/2016   Pneumococcal-Unspecified 07/17/2022   Tdap 12/20/2020   Zoster Recombinant(Shingrix) 09/25/2018, 12/04/2018     Objective: Vital Signs: BP (!) 169/77 (BP Location: Right Arm, Patient Position: Sitting, Cuff Size: Normal)   Pulse (!) 57   Resp 12   Ht 5' 2 (1.575 m)   Wt 113 lb 6.4 oz (51.4 kg)   BMI 20.74 kg/m    Physical Exam Vitals and nursing note reviewed.  Constitutional:      Appearance: She is well-developed.  HENT:     Head: Normocephalic and atraumatic.  Eyes:     Conjunctiva/sclera: Conjunctivae normal.  Cardiovascular:     Rate and Rhythm: Normal rate and regular rhythm.     Heart sounds: Normal heart sounds.  Pulmonary:     Effort: Pulmonary effort is normal.     Breath sounds: Normal breath sounds.  Abdominal:     General: Bowel sounds are normal.     Palpations: Abdomen is soft.   Musculoskeletal:     Cervical back: Normal range of motion.  Lymphadenopathy:     Cervical: No cervical adenopathy.  Skin:    General: Skin is warm and dry.     Capillary Refill: Capillary refill takes less than 2 seconds.  Neurological:     Mental Status: She is alert and oriented to person, place, and time.  Psychiatric:        Behavior: Behavior normal.      Musculoskeletal Exam: Patient was examined in the seated position.  She had limited lateral rotation of the cervical spine.  Thoracic kyphosis without tenderness was noted.  Shoulders, elbows, wrist, MCPs PIPs and DIPs were in good range of motion with no synovitis.  Hip joints cannot be assessed.  Knee joints were in good range of motion.  There was no tenderness over ankles or MTPs.  CDAI Exam: CDAI Score: -- Patient Global: --; Provider Global: -- Swollen: --; Tender: -- Joint Exam 09/19/2023   No  joint exam has been documented for this visit   There is currently no information documented on the homunculus. Go to the Rheumatology activity and complete the homunculus joint exam.  Investigation: No additional findings.  Imaging: No results found.  Recent Labs: Lab Results  Component Value Date   WBC 4.0 05/30/2023   HGB 11.9 (L) 05/30/2023   PLT 207 05/30/2023   NA 142 09/02/2023   K 4.4 09/02/2023   CL 107 (H) 09/02/2023   CO2 19 (L) 09/02/2023   GLUCOSE 98 09/02/2023   BUN 28 09/02/2023   CREATININE 1.50 (H) 09/02/2023   BILITOT 0.3 09/02/2023   ALKPHOS 60 09/02/2023   AST 21 09/02/2023   ALT 7 09/02/2023   PROT 7.2 09/02/2023   ALBUMIN 4.0 09/02/2023   CALCIUM 9.6 09/02/2023   GFRAA 40 (L) 02/28/2020    Speciality Comments: Evenity  Jun 12, 2022-July 03, 2023  Procedures:  No procedures performed Allergies: Exelon  [rivastigmine ], Fish allergy, and Codeine   Assessment / Plan:     Visit Diagnoses: Age-related osteoporosis without current pathological fracture - DEXA 02/28/22:BMD measured at [facility]  Total is 0.528 g/cm2 with a T-score of -3.8.Patient was initiated on Evenity  monthly injections on 06/12/2022 and finished July 03, 2023.  She had no side effects from Evenity .  She continues to be on calcium and vitamin D .  She was accompanied by her daughter today to discuss future treatment plan.  I did detailed discussion with the patient regarding different treatment options.  There is a risk of rapid bone deterioration after stopping Evenity  was also discussed.  I discussed that she will lose the benefit of Evenity  if it is not followed by either bisphosphonates or Prolia.  She states she will require dental work in the future.  Because of the concern about risk of osteonecrosis of the jaw we will avoid IV Reclast or subcutaneous Prolia.  Patient's daughter stated that her dentist and oral surgeon do not want her to take IV bisphosphonates are subcutaneous Prolia due to increased risk of osteonecrosis of the jaw.  We had a detailed discussion regarding possible use of oral bisphosphonates but her creatinine clearance is 15 and the oral bisphosphonates would be contraindicated.  The risk of osteonecrosis of the jaw with subcutaneous Prolia is a small.  If patient's dentist or an oral surgeon are in agreement to start her on subcutaneous Prolia then it could be is initiated to prevent rapid bone loss.  Addendum: I left messages for the patient couple of times to discuss Prolia and finally spoke with her on September 24, 2023 at 7:12 PM.  Patient and her daughter both were on the phone.  They have decided that they will wait until the dental work is complete and after the patient gets dentures, she will contact us  to start on Prolia.  We will cancel the follow-up appointment per patient's request.  Closed fracture of neck of left femur with routine healing, subsequent encounter-hemiarthroplasty November 03, 2021 by Dr. Vernetta.  Vitamin D  deficiency-she takes vitamin D .  Vitamin D  was normal at 56 on May 23, 2022.  Stage 3b chronic kidney disease (HCC)-creatinine is elevated at 1.50 and GFR 40.  Parenchymal renal hypertension, stage 1 through stage 4 or unspecified chronic kidney disease  Hypertension, essential, benign-blood pressure was elevated today at 178/82.  Repeat blood pressure was 169/77.  Patient was also advised to monitor blood pressure closely and follow-up with her PCP.  Better control of hypertension was also emphasized  to prevent further deterioration of renal function.  MCI (mild cognitive impairment) with memory loss-her daughter is planning to take her to daycare where she can have more stimulation.  Normocytic anemia-hemoglobin was low at 11.9 on May 30, 2023.  Hypertensive nephropathy  Former smoker  Orders: No orders of the defined types were placed in this encounter.  No orders of the defined types were placed in this encounter.   Follow-Up Instructions: Return in about 3 months (around 12/20/2023) for Osteoporosis.   Maya Nash, MD  Note - This record has been created using Animal nutritionist.  Chart creation errors have been sought, but may not always  have been located. Such creation errors do not reflect on  the standard of medical care.

## 2023-09-08 ENCOUNTER — Encounter: Payer: Self-pay | Admitting: Neurology

## 2023-09-08 ENCOUNTER — Ambulatory Visit: Payer: Medicare Other | Admitting: Neurology

## 2023-09-08 VITALS — BP 128/72 | Ht 62.0 in | Wt 110.0 lb

## 2023-09-08 DIAGNOSIS — F03C18 Unspecified dementia, severe, with other behavioral disturbance: Secondary | ICD-10-CM | POA: Diagnosis not present

## 2023-09-08 DIAGNOSIS — R269 Unspecified abnormalities of gait and mobility: Secondary | ICD-10-CM | POA: Diagnosis not present

## 2023-09-08 DIAGNOSIS — F03C0 Unspecified dementia, severe, without behavioral disturbance, psychotic disturbance, mood disturbance, and anxiety: Secondary | ICD-10-CM | POA: Insufficient documentation

## 2023-09-08 MED ORDER — QUETIAPINE FUMARATE 25 MG PO TABS: 25.0000 mg | ORAL_TABLET | Freq: Every day | ORAL | 11 refills | Status: AC

## 2023-09-08 NOTE — Progress Notes (Signed)
 Chief Complaint  Patient presents with   Follow-up    Rm 12,memory f/u, with daughter, memory declined somewhat   ASSESSMENT AND PLAN  Whitney Henderson is a 89 y.o. female    Advanced dementia with visual hallucinations, Mild parkinsonian features  Need total care, likely her daughter [name] is staying with her, but posed increased distress due to her nighttime agitation, visual hallucinations, somewhat response to Seroquel  25 mg every night, also taking Zoloft  50 mg every morning,  No longer on Aricept ,  Daughter reported low-dose Sinemet   25/100 mg twice a day was helping her gait, we will keep her on that  Most likely central nervous system degenerative disorder, Alzheimer's disease versus other form of dementia such as Lewy body dementia,  Suggested moderate exercise, daughter is getting Child psychotherapist support from primary care, also recommended [facility],  Return in 6 months  DIAGNOSTIC DATA (LABS, IMAGING, TESTING) - I reviewed patient records, labs, notes, testing and imaging myself where available.   MEDICAL HISTORY:  Whitney Henderson is a 89 year old female, accompanied by her daughter, to follow-up for memory loss, gait abnormality, her primary care physician is Dr. Jarold, Catheryn   I reviewed and summarized the referring note. PMHX HTN Hip replacement left in Oct 2023  She is a retired Chartered loss adjuster, was under the care of of Dr. Jenel, later Lauraine for mild cognitive impairment, previously tried Aricept  for a while, but concern for weight loss, was stopped,  Since her left hip replacement in October 2023, daughter moved in with her, no longer have significant left hip pain, but noticed mild gait abnormality  Over the past couple years, she also developed right hand tremor, talking out of dreams, occasionally visual hallucinations, usually worse with her urinary tract infection,  She is sedentary most of the time, watch TV, denies bowel and bladder  incontinence, has good appetite, sleeps okay,   Personally reviewed CT head October 2023, mild generalized atrophy  CT cervical spine mild degenerative changes no acute abnormality  Lab in 2024, normal PTH, calcium, B12, vitamin D , creatinine 1.34, CBC 10.8  UPDATE August 18th 2025: Multiple phone calls in between last visit, for increased nighttime difficulty, visual hallucinations, not sleeping well, eventually started on Seroquel  25 mg every night, which has helped her, continued on Zoloft  50 mg every morning, no longer on donepezil   She has increased gait abnormality, low-dose Sinemet  25/100 mg twice a day was helping her gait, she has irregular sleep pattern, daughter could only get into dose for her, did help her walking better   Her declining functional status, especially nighttime agitation has pose increased difficulty to be taking care of at home, daughter is exploring other options, such as memory unit placement, she is getting help from Child psychotherapist by her primary care office,   PHYSICAL EXAM:   Vitals:   09/08/23 1126  BP: 128/72  Weight: 110 lb (49.9 kg)  Height: 5' 2 (1.575 m)       09/08/2023   11:28 AM 03/11/2023   11:02 AM 09/02/2022    9:48 AM  MMSE - Mini Mental State Exam  Not completed: Unable to complete    Orientation to time  1 3  Orientation to Place  4 5  Registration  3 3  Attention/ Calculation  3 0  Recall  0 0  Language- name 2 objects  2 2  Language- repeat  1 1  Language- follow 3 step command  3 3  Language- read & follow direction  1 1  Write a sentence  1 1  Copy design  0 0  Total score  19 19    Physical Exam  General: The patient is alert and try to cooperative, but confused on neurological examination  Skin: No significant peripheral edema is noted.  Neurologic Exam  Mental status: Quiet, dependent on daughter to provide history  Cranial nerves: Facial symmetry is present. Extraocular movements are full. Visual fields are  full.  Speech is soft.  Mild masking of the face.   Motor: Moving 4 extremity without difficulty, mild right more than left rigidity, bradykinesia  Sensory examination: Intact to light touch  Coordination: No truncal ataxia or limb dysmetria  Gait and station: Has to push off from seated position to stand, gait is cautious,  Reflexes: Deep tendon reflexes are symmetric.  REVIEW OF SYSTEMS:  Full 14 system review of systems performed and notable only for as above All other review of systems were negative.   ALLERGIES: Allergies  Allergen Reactions   Exelon  [Rivastigmine ] Itching and Rash   Fish Allergy Other (See Comments)    Does not do well with Seafood.   Codeine Rash    HOME MEDICATIONS: Current Outpatient Medications  Medication Sig Dispense Refill   acetaminophen  (TYLENOL ) 500 MG tablet Take 500 mg by mouth every 6 (six) hours as needed for mild pain or moderate pain.     amLODipine  (NORVASC ) 2.5 MG tablet Take 1 tablet (2.5 mg total) by mouth in the morning and at bedtime. 180 tablet 1   Apoaequorin (PREVAGEN PO) Take 1 capsule by mouth every other day.     b complex vitamins capsule Take 1 capsule by mouth every other day.     betamethasone dipropionate (DIPROLENE) 0.05 % cream APPLY THIN LAYER EXTERNALLY TO THE AFFECTED AREA EVERY DAY AS NEEDED 60 g 2   Calcium Carb-Cholecalciferol (CALCIUM 500 +D PO) Take 1 tablet by mouth every other day.     carbidopa -levodopa  (SINEMET  IR) 25-100 MG tablet Take 1 tablet by mouth 3 (three) times daily. 270 tablet 1   Multiple Vitamins-Minerals (CENTRUM SILVER PO) Take 1 tablet by mouth every other day.     Propylene Glycol (SYSTANE BALANCE OP) Place 1 drop into both eyes daily as needed (for itchy eyes).     QUEtiapine  (SEROQUEL ) 25 MG tablet Take 1 tablet (25 mg total) by mouth at bedtime. 30 tablet 5   sertraline  (ZOLOFT ) 50 MG tablet Take 1 tablet (50 mg total) by mouth daily. 90 tablet 1   triamcinolone  cream (KENALOG ) 0.1 %  APPLY TO AFFECTED AREA TWICE DAILY AS NEEDED 45 g 0   vitamin C  (ASCORBIC ACID ) 500 MG tablet Take 500 mg by mouth every other day.     Romosozumab -aqqg (EVENITY ) 105 MG/1.17ML SOSY injection Inject 210 mg into the skin once. Evenity  210mg  monthly x 12 doses. Patient has one remaining doses. (Patient not taking: Reported on 09/08/2023)     thiamine  100 MG tablet Take 100 mg by mouth daily.     No current facility-administered medications for this visit.    PAST MEDICAL HISTORY: Past Medical History:  Diagnosis Date   Anxiety    Cataracts, bilateral    Discoid lupus    Headache 04/27/2015   Left>right periorbital   Hypertension    Memory difficulties 09/04/2016    PAST SURGICAL HISTORY: Past Surgical History:  Procedure Laterality Date   ABDOMINAL HYSTERECTOMY     BREAST EXCISIONAL BIOPSY  Left over 20 ys ago   benign   CHOLECYSTECTOMY     EXCISIONAL HEMORRHOIDECTOMY     TOTAL HIP ARTHROPLASTY Left 11/03/2021   Procedure: LEFT HEMI HIP ARTHROPLASTY ANTERIOR APPROACH;  Surgeon: Vernetta Lonni GRADE, MD;  Location: [facility] OR;  Service: Orthopedics;  Laterality: Left;    FAMILY HISTORY: Family History  Problem Relation Age of Onset   Early death Mother    Cancer Father    Thyroid  disease Daughter    Breast cancer Daughter        52s   Glaucoma Daughter    Hypertension Daughter    Heart attack Son    Thyroid  disease Son     SOCIAL HISTORY: Social History   Socioeconomic History   Marital status: Widowed    Spouse name: Marsha    Number of children: 4   Years of education: College- Masters   [employer] education level: Not on file  Occupational History   Not on file  Tobacco Use   Smoking status: Former    Types: Cigarettes    Passive exposure: Never   Smokeless tobacco: Never   Tobacco comments:    smoked sometimes not daily  Vaping Use   Vaping status: Never Used  Substance and Sexual Activity   Alcohol  use: No   Drug use: No   Sexual activity: Not Currently   Other Topics Concern   Not on file  Social History Narrative   Live with daughter 4 children.    Caffeine: 1 cup daily    Right handed   Social Drivers of Health   Financial Resource Strain: Low Risk  (05/26/2023)   Overall Financial Resource Strain (CARDIA)    Difficulty of Paying Living Expenses: Not hard at all  Food Insecurity: No Food Insecurity (05/26/2023)   Hunger Vital Sign    Worried About Running Out of Food in the Last Year: Never true    Ran Out of Food in the Last Year: Never true  Transportation Needs: No Transportation Needs (05/26/2023)   PRAPARE - Administrator, Civil Service (Medical): No    Lack of Transportation (Non-Medical): No  Physical Activity: Sufficiently Active (08/07/2022)   Exercise Vital Sign    Days of Exercise per Week: 7 days    Minutes of Exercise per Session: 30 min  Stress: Stress Concern Present (05/26/2023)   [employer] of Occupational Health - Occupational Stress Questionnaire    Feeling of Stress : To some extent  Social Connections: Socially Isolated (08/07/2022)   Social Connection and Isolation Panel    Frequency of Communication with Friends and Family: Twice a week    Frequency of Social Gatherings with Friends and Family: Never    Attends Religious Services: More than 4 times per year    Active Member of [employer] or Organizations: No    Attends Banker Meetings: Never    Marital Status: Widowed  Intimate Partner Violence: Not At Risk (05/26/2023)   Humiliation, Afraid, Rape, and Kick questionnaire    Fear of Current or Ex-Partner: No    Emotionally Abused: No    Physically Abused: No    Sexually Abused: No   Modena Callander. M.D. Ph.D.

## 2023-09-13 NOTE — Assessment & Plan Note (Signed)
 Ongoing symptoms with sleep disturbances managed by quetiapine . Possible hallucinations discussed; differential includes Lewy body dementia. Treatment aims to prevent worsening. - Continue quetiapine  for sleep disturbances. - Follow up with neurology next Monday. - Discuss potential Lewy body dementia with neurologist.

## 2023-09-13 NOTE — Assessment & Plan Note (Signed)
 Chronic, controlled. Goal BP <130/80.  She will c/w amlodipine  2.5mg  twice daily(she did not tolerate once daily dosing).  She is encouraged to follow low sodium diet. She will f/u in 4-6 months for re-evaluation.

## 2023-09-13 NOTE — Assessment & Plan Note (Signed)
 Stable with sertraline  adjusted to morning dosing. Mood reported as good. - Continue sertraline  with morning dosing.

## 2023-09-13 NOTE — Assessment & Plan Note (Signed)
 I will check labs as below. She is encouraged to stay well hydrated, avoid NSAIDs and keep BP controlled to prevent progression of CKD.

## 2023-09-13 NOTE — Assessment & Plan Note (Addendum)
 Chronic, she has completed one year course of Evenity . Rheumatology input is appreciated.

## 2023-09-19 ENCOUNTER — Ambulatory Visit: Payer: Medicare Other | Attending: Rheumatology | Admitting: Rheumatology

## 2023-09-19 ENCOUNTER — Telehealth: Payer: Self-pay

## 2023-09-19 ENCOUNTER — Encounter: Payer: Self-pay | Admitting: Rheumatology

## 2023-09-19 VITALS — BP 169/77 | HR 57 | Resp 12 | Ht 62.0 in | Wt 113.4 lb

## 2023-09-19 DIAGNOSIS — M810 Age-related osteoporosis without current pathological fracture: Secondary | ICD-10-CM | POA: Diagnosis not present

## 2023-09-19 DIAGNOSIS — Z87891 Personal history of nicotine dependence: Secondary | ICD-10-CM | POA: Diagnosis not present

## 2023-09-19 DIAGNOSIS — N1832 Chronic kidney disease, stage 3b: Secondary | ICD-10-CM | POA: Diagnosis not present

## 2023-09-19 DIAGNOSIS — G3184 Mild cognitive impairment, so stated: Secondary | ICD-10-CM

## 2023-09-19 DIAGNOSIS — S72002D Fracture of unspecified part of neck of left femur, subsequent encounter for closed fracture with routine healing: Secondary | ICD-10-CM

## 2023-09-19 DIAGNOSIS — I129 Hypertensive chronic kidney disease with stage 1 through stage 4 chronic kidney disease, or unspecified chronic kidney disease: Secondary | ICD-10-CM | POA: Diagnosis not present

## 2023-09-19 DIAGNOSIS — D649 Anemia, unspecified: Secondary | ICD-10-CM

## 2023-09-19 DIAGNOSIS — I10 Essential (primary) hypertension: Secondary | ICD-10-CM

## 2023-09-19 DIAGNOSIS — E559 Vitamin D deficiency, unspecified: Secondary | ICD-10-CM

## 2023-09-19 NOTE — Telephone Encounter (Signed)
 [name] left patient's daughter a message today after the appointment advising her to call the office.    [name] will discuss medication with the patient's daughter when she is back in the office next week. We will not proceed with Actonel.

## 2023-09-19 NOTE — Telephone Encounter (Signed)
 Patient called the office back to speak with [name] . Would like a call back.

## 2023-09-24 NOTE — Telephone Encounter (Signed)
 Reached out to patient's daughter and advised [name] is out of the office until Thursday. Advised [name] will call back once she returns to the office.

## 2023-09-24 NOTE — Telephone Encounter (Signed)
 Patients daughter called again stating she would like a call back.

## 2023-09-24 NOTE — Telephone Encounter (Signed)
 I spoke with her on September 24, 2023 at 7:12 PM.  Patient and her daughter both were on the phone.  They have decided that they will wait until the dental work is complete and after the patient gets dentures, she will contact us  to start on Prolia when she is ready.  He is cancel the follow-up appointment per patient's request.

## 2023-09-25 NOTE — Telephone Encounter (Signed)
 The follow up appointment has been canceled, per patient's request.

## 2023-10-02 ENCOUNTER — Other Ambulatory Visit: Payer: Self-pay

## 2023-10-02 NOTE — Patient Instructions (Signed)
 Visit Information  Thank you for taking time to visit with me today.   Please call the care guide team at [phone] if you need to cancel, schedule, or reschedule an appointment.   Please call 1-800-273-TALK (toll free, 24 hour hotline) if you are experiencing a Mental Health or Behavioral Health Crisis or need someone to talk to.  Clayborne Ly RN BSN CCM [place]  [facility], [facility] Nurse Care Coordinator  Direct Dial: [phone] Website: [email or web address]

## 2023-10-02 NOTE — Patient Outreach (Addendum)
 Complex Care Management   Visit Note  10/02/2023  Name:  Whitney Henderson MRN: [id number] DOB: [birth year]  Situation: Referral received for Complex Care Management related to  Parkinsonism, Mild Cognitive Dementia, Hallucinations, recurrent UTI, Falls. I obtained verbal consent from Patient.  Visit completed with Patient on the phone.  Background:   Past Medical History:  Diagnosis Date   Anxiety    Cataracts, bilateral    Dementia (HCC)    followed by neuro per patient's daughter   Discoid lupus    Headache 04/27/2015   Left>right periorbital   Hypertension    Memory difficulties 09/04/2016   Parkinson's disease (HCC)    followed by neuro per patient's daughter    Assessment: Patient Reported Symptoms:  Cognitive Cognitive Status: Normal speech and language skills, Alert and oriented to person, place, and time, Struggling with memory recall, Requires Assistance Decision Making Cognitive/Intellectual Conditions Management [RPT]: Other Other: Severe dementia with other behavioral disturbance, unspecified dementia   Health Maintenance Behaviors: Annual physical exam Health Facilitated by: Rest  Neurological Neurological Review of Symptoms: Dizziness Neurological Management Strategies: Routine screening Neurological Self-Management Outcome: 3 (uncertain)  HEENT HEENT Symptoms Reported: Mouth or teeth pain (gum inflammation, recent tooth extraction, may have dentures made) HEENT Management Strategies: Routine screening HEENT Self-Management Outcome: 4 (good)    Cardiovascular Cardiovascular Symptoms Reported: No symptoms reported    Respiratory Respiratory Symptoms Reported: No symptoms reported    Endocrine Endocrine Symptoms Reported: No symptoms reported    Gastrointestinal Gastrointestinal Symptoms Reported: Constipation Gastrointestinal Management Strategies: Diet modification Gastrointestinal Self-Management Outcome: 4 (good)    Genitourinary Genitourinary Symptoms  Reported: Incontinence Genitourinary Management Strategies: Incontinence garment/pad Genitourinary Self-Management Outcome: 4 (good)  Integumentary Integumentary Symptoms Reported: No symptoms reported    Musculoskeletal Musculoskelatal Symptoms Reviewed: Difficulty walking, Limited mobility, Unsteady gait Musculoskeletal Management Strategies: Adequate rest, Medical device, Routine screening Musculoskeletal Self-Management Outcome: 4 (good) Falls in the past year?: Yes Number of falls in past year: 1 or less Was there an injury with Fall?: Yes Fall Risk Category Calculator: 2 Patient Fall Risk Level: Moderate Fall Risk Patient at Risk for Falls Due to: History of fall(s), Impaired balance/gait, Impaired mobility Fall risk Follow up: Falls evaluation completed, Education provided, Falls prevention discussed  Psychosocial Psychosocial Symptoms Reported: No symptoms reported     Quality of Family Relationships: helpful, involved, supportive Do you feel physically threatened by others?: No    10/02/2023    PHQ2-9 Depression Screening   Quanta Roher interest or pleasure in doing things    Feeling down, depressed, or hopeless    PHQ-2 - Total Score    Trouble falling or staying asleep, or sleeping too much    Feeling tired or having Derrien Anschutz energy    Poor appetite or overeating     Feeling bad about yourself - or that you are a failure or have let yourself or your family down    Trouble concentrating on things, such as reading the newspaper or watching television    Moving or speaking so slowly that other people could have noticed.  Or the opposite - being so fidgety or restless that you have been moving around a lot more than usual    Thoughts that you would be better off dead, or hurting yourself in some way    PHQ2-9 Total Score    If you checked off any problems, how difficult have these problems made it for you to do your work, take care of things at home, or get  along with other people     Depression Interventions/Treatment      There were no vitals filed for this visit.  Medications Reviewed Today     Reviewed by Morgan Clayborne CROME, RN (Registered Nurse) on 10/02/23 at 1515  Med List Status: <None>   Medication Order Taking? Sig Documenting Provider Last Dose Status Informant  acetaminophen  (TYLENOL ) 500 MG tablet [id number]  Take 500 mg by mouth every 6 (six) hours as needed for mild pain or moderate pain. [provider]  Active Child, Self  amLODipine  (NORVASC ) 2.5 MG tablet [id number]  Take 1 tablet (2.5 mg total) by mouth in the morning and at bedtime. Jarold Medici, MD  Active   Apoaequorin (PREVAGEN PO) [id number]  Take 1 capsule by mouth every other day. [provider]  Active Child, Self  b complex vitamins capsule [id number]  Take 1 capsule by mouth every other day. [provider]  Active   betamethasone dipropionate (DIPROLENE) 0.05 % cream [id number]  APPLY THIN LAYER EXTERNALLY TO THE AFFECTED AREA EVERY DAY AS NEEDED Georgina Speaks, FNP  Active Child, Self  Calcium Carb-Cholecalciferol (CALCIUM 500 +D PO) [id number]  Take 1 tablet by mouth every other day. [provider]  Active Child, Self  carbidopa -levodopa  (SINEMET  IR) 25-100 MG tablet [id number]  Take 1 tablet by mouth 3 (three) times daily. Gayland Lauraine PARAS, NP  Active   Multiple Vitamins-Minerals (CENTRUM SILVER PO) [id number]  Take 1 tablet by mouth every other day. [provider]  Active Child, Self  Propylene Glycol (SYSTANE BALANCE OP) [id number]  Place 1 drop into both eyes daily as needed (for itchy eyes). [provider]  Active Child, Self  QUEtiapine  (SEROQUEL ) 25 MG tablet [id number]  Take 1 tablet (25 mg total) by mouth at bedtime. Onita Duos, MD  Active   Romosozumab -aqqg (EVENITY ) 105 MG/1.17ML SOSY injection [id number]  Inject 210 mg into the skin once. Evenity  210mg  monthly x 12 doses. Patient has one remaining doses.  Patient not taking: Reported on  09/19/2023   [provider]  Active   sertraline  (ZOLOFT ) 50 MG tablet [id number]  Take 1 tablet (50 mg total) by mouth daily. Jarold Medici, MD  Active   thiamine  100 MG tablet [id number]  Take 100 mg by mouth daily. [provider]  Active Child, Self  triamcinolone  cream (KENALOG ) 0.1 % [id number]  APPLY TO AFFECTED AREA TWICE DAILY AS NEEDED Jarold Medici, MD  Active Child, Self  vitamin C  (ASCORBIC ACID ) 500 MG tablet [id number]  Take 500 mg by mouth every other day. [provider]  Active Child, Self            Recommendation:   Continue Current Plan of Care  Follow Up Plan:   Patient has met all care management goals. Care Management case will be closed. Patient has been provided contact information should new needs arise.   Clayborne Morgan RN BSN CCM [place]  [facility], [facility] Nurse Care Coordinator  Direct Dial: [phone] Website: [email or web address]

## 2023-10-03 ENCOUNTER — Ambulatory Visit (INDEPENDENT_AMBULATORY_CARE_PROVIDER_SITE_OTHER)

## 2023-10-03 DIAGNOSIS — Z0000 Encounter for general adult medical examination without abnormal findings: Secondary | ICD-10-CM | POA: Diagnosis not present

## 2023-10-03 NOTE — Progress Notes (Signed)
 Subjective:   Whitney Henderson is a 89 y.o. female who presents for Medicare Annual (Subsequent) preventive examination.  Visit Complete: Virtual I connected with  Whitney Henderson on 10/03/23 by a audio enabled telemedicine application and verified that I am speaking with the correct person using two identifiers.  Patient Location: Home  Provider Location: Office/Clinic  I discussed the limitations of evaluation and management by telemedicine. The patient expressed understanding and agreed to proceed.  Vital Signs: Because this visit was a virtual/telehealth visit, some criteria may be missing or patient reported. Any vitals not documented were not able to be obtained and vitals that have been documented are patient reported.  Patient Medicare AWV questionnaire was completed by the patient on 10/03/23; I have confirmed that all information answered by patient is correct and no changes since this date.        Objective:    There were no vitals filed for this visit. There is no height or weight on file to calculate BMI.     05/15/2023    1:58 PM 03/17/2023    1:52 PM 08/07/2022   11:06 AM 07/19/2021   11:05 AM 12/20/2020   11:43 AM 06/29/2020   11:28 AM 04/25/2020    2:55 PM  Advanced Directives  Does Patient Have a Medical Advance Directive? No Yes Yes Yes No Yes No  Type of Special educational needs teacher of [place];Living will Healthcare Power of [place];Living will Healthcare Power of [place];Living will  Healthcare Power of [place];Living will   Copy of Healthcare Power of Attorney in Chart?   No - copy requested No - copy requested  No - copy requested   Would patient like information on creating a medical advance directive? No - Patient declined      No - Patient declined    Current Medications (verified) Outpatient Encounter Medications as of 10/03/2023  Medication Sig   acetaminophen  (TYLENOL ) 500 MG tablet Take 500 mg by mouth every 6 (six) hours as needed for mild  pain or moderate pain.   amLODipine  (NORVASC ) 2.5 MG tablet Take 1 tablet (2.5 mg total) by mouth in the morning and at bedtime.   Apoaequorin (PREVAGEN PO) Take 1 capsule by mouth every other day.   b complex vitamins capsule Take 1 capsule by mouth every other day.   betamethasone dipropionate (DIPROLENE) 0.05 % cream APPLY THIN LAYER EXTERNALLY TO THE AFFECTED AREA EVERY DAY AS NEEDED   Calcium Carb-Cholecalciferol (CALCIUM 500 +D PO) Take 1 tablet by mouth every other day.   carbidopa -levodopa  (SINEMET  IR) 25-100 MG tablet Take 1 tablet by mouth 3 (three) times daily.   Multiple Vitamins-Minerals (CENTRUM SILVER PO) Take 1 tablet by mouth every other day.   Propylene Glycol (SYSTANE BALANCE OP) Place 1 drop into both eyes daily as needed (for itchy eyes).   QUEtiapine  (SEROQUEL ) 25 MG tablet Take 1 tablet (25 mg total) by mouth at bedtime.   Romosozumab -aqqg (EVENITY ) 105 MG/1.17ML SOSY injection Inject 210 mg into the skin once. Evenity  210mg  monthly x 12 doses. Patient has one remaining doses. (Patient not taking: Reported on 09/19/2023)   sertraline  (ZOLOFT ) 50 MG tablet Take 1 tablet (50 mg total) by mouth daily.   thiamine  100 MG tablet Take 100 mg by mouth daily.   triamcinolone  cream (KENALOG ) 0.1 % APPLY TO AFFECTED AREA TWICE DAILY AS NEEDED   vitamin C  (ASCORBIC ACID ) 500 MG tablet Take 500 mg by mouth every other day.   No facility-administered  encounter medications on file as of 10/03/2023.    Allergies (verified) Exelon  [rivastigmine ], Fish allergy, and Codeine   History: Past Medical History:  Diagnosis Date   Anxiety    Cataracts, bilateral    Dementia (HCC)    followed by neuro per patient's daughter   Discoid lupus    Headache 04/27/2015   Left>right periorbital   Hypertension    Memory difficulties 09/04/2016   Parkinson's disease (HCC)    followed by neuro per patient's daughter   Past Surgical History:  Procedure Laterality Date   ABDOMINAL HYSTERECTOMY      BREAST EXCISIONAL BIOPSY Left over 20 ys ago   benign   CHOLECYSTECTOMY     EXCISIONAL HEMORRHOIDECTOMY     TOTAL HIP ARTHROPLASTY Left 11/03/2021   Procedure: LEFT HEMI HIP ARTHROPLASTY ANTERIOR APPROACH;  Surgeon: Vernetta Lonni GRADE, MD;  Location: [facility] OR;  Service: Orthopedics;  Laterality: Left;   Family History  Problem Relation Age of Onset   Early death Mother    Cancer Father    Thyroid  disease Daughter    Breast cancer Daughter        79s   Glaucoma Daughter    Hypertension Daughter    Heart attack Son    Thyroid  disease Son    Social History   Socioeconomic History   Marital status: Widowed    Spouse name: Marsha    Number of children: 4   Years of education: College- Masters   [employer] education level: Not on file  Occupational History   Not on file  Tobacco Use   Smoking status: Former    Types: Cigarettes    Passive exposure: Never   Smokeless tobacco: Never   Tobacco comments:    smoked sometimes not daily  Vaping Use   Vaping status: Never Used  Substance and Sexual Activity   Alcohol  use: No   Drug use: No   Sexual activity: Not Currently  Other Topics Concern   Not on file  Social History Narrative   Live with daughter 4 children.    Caffeine: 1 cup daily    Right handed   Social Drivers of Health   Financial Resource Strain: Low Risk  (05/26/2023)   Overall Financial Resource Strain (CARDIA)    Difficulty of Paying Living Expenses: Not hard at all  Food Insecurity: No Food Insecurity (10/02/2023)   Hunger Vital Sign    Worried About Running Out of Food in the Last Year: Never true    Ran Out of Food in the Last Year: Never true  Transportation Needs: No Transportation Needs (10/02/2023)   PRAPARE - Administrator, Civil Service (Medical): No    Lack of Transportation (Non-Medical): No  Physical Activity: Sufficiently Active (08/07/2022)   Exercise Vital Sign    Days of Exercise per Week: 7 days    Minutes of Exercise per  Session: 30 min  Stress: Stress Concern Present (05/26/2023)   Whitney Henderson of Occupational Health - Occupational Stress Questionnaire    Feeling of Stress : To some extent  Social Connections: Socially Isolated (08/07/2022)   Social Connection and Isolation Panel    Frequency of Communication with Friends and Family: Twice a week    Frequency of Social Gatherings with Friends and Family: Never    Attends Religious Services: More than 4 times per year    Active Member of [employer] or Organizations: No    Attends Banker Meetings: Never    Marital Status:  Widowed    Tobacco Counseling Counseling given: Not Answered Tobacco comments: smoked sometimes not daily   Clinical Intake:                        Activities of Daily Living     No data to display          Patient Care Team: Jarold Medici, MD as PCP - General (Internal Medicine) Cleatus Collar, MD as Consulting Physician (Ophthalmology) Pearson, Vallie J, [facility] (Inactive) (Pharmacist) Little, Clayborne CROME, RN as VBCI Care Management  Indicate any recent Medical Services you may have received from other than [facility] providers in the past year (date may be approximate).     Assessment:   This is a routine wellness examination for Whitney Henderson.  Hearing/Vision screen No results found.   Goals Addressed   None    Depression Screen    09/02/2023    2:46 PM 05/26/2023    4:44 PM 05/26/2023    3:07 PM 03/17/2023    1:54 PM 08/07/2022   11:09 AM 03/28/2022    2:35 PM 07/19/2021   11:08 AM  PHQ 2/9 Scores  PHQ - 2 Score 0 0 0 0 3 0 1  PHQ- 9 Score 0    4      Fall Risk    10/02/2023    2:18 PM 09/02/2023    2:45 PM 07/28/2023    2:24 PM 06/27/2023    2:47 PM 05/26/2023    3:00 PM  Fall Risk   Falls in the past year? 1 1 1 1 1   Number falls in past yr: 0 0 1 0 0  Injury with Fall? 1 0 0 1 1  Risk for fall due to : History of fall(s);Impaired balance/gait;Impaired mobility History of fall(s) History of  fall(s);Impaired balance/gait;Impaired mobility History of fall(s);Impaired balance/gait;Impaired mobility;Mental status change History of fall(s);Impaired mobility  Follow up Falls evaluation completed;Education provided;Falls prevention discussed Falls evaluation completed Education provided;Falls evaluation completed;Falls prevention discussed Falls prevention discussed;Education provided;Falls evaluation completed     MEDICARE RISK AT HOME:    TIMED UP AND GO:  Was the test performed?  No    Cognitive Function:    09/08/2023   11:28 AM 03/11/2023   11:02 AM 09/02/2022    9:48 AM 05/10/2021   10:48 AM 09/07/2020   10:51 AM  MMSE - Mini Mental State Exam  Not completed: Unable to complete      Orientation to time  1 3 4 4   Orientation to Place  4 5 5 5   Registration  3 3 3 3   Attention/ Calculation  3 0 0 3  Recall  0 0 1 0  Language- name 2 objects  2 2 2 2   Language- repeat  1 1 1 1   Language- follow 3 step command  3 3 3 3   Language- read & follow direction  1 1 1 1   Write a sentence  1 1 1 1   Copy design  0 0 1 0  Total score  19 19 22 23         06/29/2020   11:30 AM 12/02/2019    9:31 AM 12/01/2018   10:58 AM 10/30/2017   10:59 AM  6CIT Screen  What Year? 4 points 0 points 0 points 0 points  What month? 0 points 0 points 0 points 0 points  What time? 0 points 0 points 0 points 0 points  Count back from 20 0  points 0 points 0 points 0 points  Months in reverse 2 points 4 points 4 points 2 points  Repeat phrase 10 points 8 points 10 points 2 points  Total Score 16 points 12 points 14 points 4 points    Immunizations Immunization History  Administered Date(s) Administered   Fluad Quad(high Dose 65+) 09/25/2018   INFLUENZA, HIGH DOSE SEASONAL PF 10/02/2016, 10/14/2019   Influenza-Unspecified 10/22/2017, 10/01/2021   PFIZER(Purple Top)SARS-COV-2 Vaccination 02/09/2019, 03/01/2019, 12/09/2019, 06/20/2020   Pneumococcal Conjugate-13 11/28/2015   Pneumococcal  Polysaccharide-23 12/04/2016   Pneumococcal-Unspecified 07/17/2022   Tdap 12/20/2020   Zoster Recombinant(Shingrix) 09/25/2018, 12/04/2018    TDAP status: Up to date  Flu Vaccine status: Due, Education has been provided regarding the importance of this vaccine. Advised may receive this vaccine at local pharmacy or Health Dept. Aware to provide a copy of the vaccination record if obtained from local pharmacy or Health Dept. Verbalized acceptance and understanding.  Pneumococcal vaccine status: Up to date  Covid-19 vaccine status: Information provided on how to obtain vaccines.   Qualifies for Shingles Vaccine? Yes   Zostavax completed Yes   Shingrix Completed?: Yes  Screening Tests Health Maintenance  Topic Date Due   Medicare Annual Wellness (AWV)  08/07/2023   Influenza Vaccine  08/22/2023   COVID-19 Vaccine (5 - 2025-26 season) 09/22/2023   DTaP/Tdap/Td (2 - Td or Tdap) 12/21/2030   Pneumococcal Vaccine: 50+ Years  Completed   DEXA SCAN  Completed   Zoster Vaccines- Shingrix  Completed   HPV VACCINES  Aged Out   Meningococcal B Vaccine  Aged Out    Health Maintenance  Health Maintenance Due  Topic Date Due   Medicare Annual Wellness (AWV)  08/07/2023   Influenza Vaccine  08/22/2023   COVID-19 Vaccine (5 - 2025-26 season) 09/22/2023    Colorectal cancer screening: No longer required.   Mammogram status: No longer required due to age.    Lung Cancer Screening: (Low Dose CT Chest recommended if Age 28-80 years, 20 pack-year currently smoking OR have quit w/in 15years.) does not qualify.   Lung Cancer Screening Referral:   Additional Screening:  Hepatitis C Screening: does not qualify; Completed n/a  Vision Screening: Recommended annual ophthalmology exams for early detection of glaucoma and other disorders of the eye. Is the patient up to date with their annual eye exam?  Yes  Who is the provider or what is the name of the office in which the patient attends  annual eye exams? Dr.Hecker If pt is not established with a provider, would they like to be referred to a provider to establish care? established.   Dental Screening: Recommended annual dental exams for proper oral hygiene  Diabetic Foot Exam: not DM  Community Resource Referral / Chronic Care Management: CRR required this visit?  No   CCM required this visit?  No     Plan:     I have personally reviewed and noted the following in the patient's chart:   Medical and social history Use of alcohol , tobacco or illicit drugs  Current medications and supplements including opioid prescriptions. Patient is not currently taking opioid prescriptions. Functional ability and status Nutritional status Physical activity Advanced directives List of other physicians Hospitalizations, surgeries, and ER visits in previous 12 months Vitals Screenings to include cognitive, depression, and falls Referrals and appointments  In addition, I have reviewed and discussed with patient certain preventive protocols, quality metrics, and best practice recommendations. A written personalized care plan for preventive services as  well as general preventive health recommendations were provided to patient.     Kristeen JINNY Lunger, CMA   10/03/2023   After Visit Summary: (Declined) Due to this being a telephonic visit, with patients personalized plan was offered to patient but patient Declined AVS at this time   Nurse Notes: Patients daughter states paper work from memory care is being faxed over.

## 2023-10-15 ENCOUNTER — Encounter (HOSPITAL_COMMUNITY): Payer: Self-pay

## 2023-10-15 ENCOUNTER — Emergency Department (HOSPITAL_COMMUNITY)
Admission: EM | Admit: 2023-10-15 | Discharge: 2023-10-15 | Disposition: A | Discharge status: Home / Self Care | Attending: Emergency Medicine | Admitting: Emergency Medicine

## 2023-10-15 ENCOUNTER — Other Ambulatory Visit: Payer: Self-pay

## 2023-10-15 DIAGNOSIS — N3000 Acute cystitis without hematuria: Secondary | ICD-10-CM | POA: Diagnosis not present

## 2023-10-15 DIAGNOSIS — I10 Essential (primary) hypertension: Secondary | ICD-10-CM | POA: Insufficient documentation

## 2023-10-15 DIAGNOSIS — G20C Parkinsonism, unspecified: Secondary | ICD-10-CM | POA: Diagnosis not present

## 2023-10-15 DIAGNOSIS — F0390 Unspecified dementia without behavioral disturbance: Secondary | ICD-10-CM | POA: Diagnosis not present

## 2023-10-15 LAB — CBC WITH DIFFERENTIAL/PLATELET
Abs Immature Granulocytes: 0.01 K/uL (ref 0.00–0.07)
Basophils Absolute: 0.1 K/uL (ref 0.0–0.1)
Basophils Relative: 1 %
Eosinophils Absolute: 0.1 K/uL (ref 0.0–0.5)
Eosinophils Relative: 2 %
HCT: 36.4 % (ref 36.0–46.0)
Hemoglobin: 11.4 g/dL — ABNORMAL LOW (ref 12.0–15.0)
Immature Granulocytes: 0 %
Lymphocytes Relative: 27 %
Lymphs Abs: 1 K/uL (ref 0.7–4.0)
MCH: 27.5 pg (ref 26.0–34.0)
MCHC: 31.3 g/dL (ref 30.0–36.0)
MCV: 87.9 fL (ref 80.0–100.0)
Monocytes Absolute: 0.5 K/uL (ref 0.1–1.0)
Monocytes Relative: 13 %
Neutro Abs: 2.2 K/uL (ref 1.7–7.7)
Neutrophils Relative %: 57 %
Platelets: 179 K/uL (ref 150–400)
RBC: 4.14 MIL/uL (ref 3.87–5.11)
RDW: 15.3 % (ref 11.5–15.5)
WBC: 3.8 K/uL — ABNORMAL LOW (ref 4.0–10.5)
nRBC: 0 % (ref 0.0–0.2)

## 2023-10-15 LAB — URINALYSIS, ROUTINE W REFLEX MICROSCOPIC
Bilirubin Urine: NEGATIVE
Glucose, UA: NEGATIVE mg/dL
Hgb urine dipstick: NEGATIVE
Ketones, ur: NEGATIVE mg/dL
Nitrite: POSITIVE — AB
Protein, ur: NEGATIVE mg/dL
Specific Gravity, Urine: 1.008 (ref 1.005–1.030)
pH: 5 (ref 5.0–8.0)

## 2023-10-15 LAB — COMPREHENSIVE METABOLIC PANEL WITH GFR
ALT: 14 U/L (ref 0–44)
AST: 24 U/L (ref 15–41)
Albumin: 4 g/dL (ref 3.5–5.0)
Alkaline Phosphatase: 59 U/L (ref 38–126)
Anion gap: 13 (ref 5–15)
BUN: 28 mg/dL — ABNORMAL HIGH (ref 8–23)
CO2: 21 mmol/L — ABNORMAL LOW (ref 22–32)
Calcium: 9.4 mg/dL (ref 8.9–10.3)
Chloride: 104 mmol/L (ref 98–111)
Creatinine, Ser: 1.45 mg/dL — ABNORMAL HIGH (ref 0.44–1.00)
GFR, Estimated: 33 mL/min — ABNORMAL LOW (ref >60)
Glucose, Bld: 113 mg/dL — ABNORMAL HIGH (ref 70–99)
Potassium: 4.4 mmol/L (ref 3.5–5.1)
Sodium: 137 mmol/L (ref 135–145)
Total Bilirubin: 0.4 mg/dL (ref 0.0–1.2)
Total Protein: 7.4 g/dL (ref 6.5–8.1)

## 2023-10-15 MED ORDER — SODIUM CHLORIDE 0.9 % IV BOLUS
500.0000 mL | Freq: Once | INTRAVENOUS | Status: AC
  Administered 2023-10-15: 500 mL via INTRAVENOUS

## 2023-10-15 MED ORDER — SODIUM CHLORIDE 0.9 % IV SOLN
1.0000 g | Freq: Once | INTRAVENOUS | Status: AC
  Administered 2023-10-15: 1 g via INTRAVENOUS
  Filled 2023-10-15: qty 10

## 2023-10-15 MED ORDER — CEPHALEXIN 500 MG PO CAPS: 500.0000 mg | ORAL_CAPSULE | Freq: Three times a day (TID) | ORAL | 0 refills | Status: DC

## 2023-10-15 NOTE — ED Provider Notes (Signed)
 [place] EMERGENCY DEPARTMENT AT [facility] Provider Note   CSN: [id number] Arrival date & time: 10/15/23  1251     Patient presents with: Evaluation   Whitney Henderson is a 89 y.o. female.   Pt is a 89 yo female with pmhx significant for htn, anxiety, parkinson's lupus, and dementia.  Pt lives alone.  Pt's daughter went over to her house and pt was not acting her normal self.  She was not responding to her as she usually does.  She was able to get up and dress herself and eat.  She is now back to her usual.  Pt's daughter is concerned she has a UTI as this is how she acts with UTIs.       Prior to Admission medications   Medication Sig Start Date End Date Taking? Authorizing Provider  cephALEXin  (KEFLEX ) 500 MG capsule Take 1 capsule (500 mg total) by mouth 3 (three) times daily. 10/15/23  Yes Dean Clarity, MD  acetaminophen  (TYLENOL ) 500 MG tablet Take 500 mg by mouth every 6 (six) hours as needed for mild pain or moderate pain.    [provider]  amLODipine  (NORVASC ) 2.5 MG tablet Take 1 tablet (2.5 mg total) by mouth in the morning and at bedtime. 09/02/23   Jarold Medici, MD  Apoaequorin (PREVAGEN PO) Take 1 capsule by mouth every other day.    [provider]  b complex vitamins capsule Take 1 capsule by mouth every other day.    [provider]  betamethasone dipropionate (DIPROLENE) 0.05 % cream APPLY THIN LAYER EXTERNALLY TO THE AFFECTED AREA EVERY DAY AS NEEDED 05/25/18   Moore, Janece, FNP  Calcium Carb-Cholecalciferol (CALCIUM 500 +D PO) Take 1 tablet by mouth every other day.    [provider]  carbidopa -levodopa  (SINEMET  IR) 25-100 MG tablet Take 1 tablet by mouth 3 (three) times daily. 03/11/23   Gayland Lauraine PARAS, NP  Multiple Vitamins-Minerals (CENTRUM SILVER PO) Take 1 tablet by mouth every other day.    [provider]  Propylene Glycol (SYSTANE BALANCE OP) Place 1 drop into both eyes daily as needed (for itchy  eyes).    [provider]  QUEtiapine  (SEROQUEL ) 25 MG tablet Take 1 tablet (25 mg total) by mouth at bedtime. 09/08/23   Onita Duos, MD  Romosozumab -aqqg (EVENITY ) 105 MG/1.17ML SOSY injection Inject 210 mg into the skin once. Evenity  210mg  monthly x 12 doses. Patient has one remaining doses. Patient not taking: Reported on 10/03/2023    [provider]  sertraline  (ZOLOFT ) 50 MG tablet Take 1 tablet (50 mg total) by mouth daily. 09/02/23 09/01/24  Jarold Medici, MD  thiamine  100 MG tablet Take 100 mg by mouth daily.    [provider]  triamcinolone  cream (KENALOG ) 0.1 % APPLY TO AFFECTED AREA TWICE DAILY AS NEEDED 04/16/21   Jarold Medici, MD  vitamin C  (ASCORBIC ACID ) 500 MG tablet Take 500 mg by mouth every other day.    [provider]    Allergies: Exelon  [rivastigmine ], Fish allergy, and Codeine    Review of Systems  Neurological:  Positive for weakness.  All other systems reviewed and are negative.   Updated Vital Signs BP (!) 176/79 (BP Location: Left Arm)   Pulse 60   Temp 98.1 F (36.7 C)   Resp 16   SpO2 100%   Physical Exam Vitals and nursing note reviewed.  Constitutional:      Appearance: Normal appearance.  HENT:  Head: Normocephalic and atraumatic.     Right Ear: External ear normal.     Left Ear: External ear normal.     Nose: Nose normal.     Mouth/Throat:     Mouth: Mucous membranes are dry.  Eyes:     Extraocular Movements: Extraocular movements intact.     Conjunctiva/sclera: Conjunctivae normal.     Pupils: Pupils are equal, round, and reactive to light.  Cardiovascular:     Rate and Rhythm: Normal rate and regular rhythm.     Pulses: Normal pulses.     Heart sounds: Normal heart sounds.  Pulmonary:     Effort: Pulmonary effort is normal.     Breath sounds: Normal breath sounds.  Abdominal:     General: Abdomen is flat. Bowel sounds are normal.     Palpations: Abdomen is soft.  Musculoskeletal:         General: Normal range of motion.     Cervical back: Normal range of motion and neck supple.  Skin:    General: Skin is warm.     Capillary Refill: Capillary refill takes less than 2 seconds.  Neurological:     General: No focal deficit present.     Mental Status: She is alert. Mental status is at baseline.  Psychiatric:        Mood and Affect: Mood normal.        Behavior: Behavior normal.        Thought Content: Thought content normal.     (all labs ordered are listed, but only abnormal results are displayed) Labs Reviewed  URINALYSIS, ROUTINE W REFLEX MICROSCOPIC - Abnormal; Notable for the following components:      Result Value   Color, Urine STRAW (*)    Nitrite POSITIVE (*)    Leukocytes,Ua SMALL (*)    Bacteria, UA MANY (*)    All other components within normal limits  COMPREHENSIVE METABOLIC PANEL WITH GFR - Abnormal; Notable for the following components:   CO2 21 (*)    Glucose, Bld 113 (*)    BUN 28 (*)    Creatinine, Ser 1.45 (*)    GFR, Estimated 33 (*)    All other components within normal limits  CBC WITH DIFFERENTIAL/PLATELET - Abnormal; Notable for the following components:   WBC 3.8 (*)    Hemoglobin 11.4 (*)    All other components within normal limits  URINE CULTURE    EKG: None  Radiology: No results found.   Procedures   Medications Ordered in the ED  sodium chloride  0.9 % bolus 500 mL (0 mLs Intravenous Stopped 10/15/23 1633)  cefTRIAXone  (ROCEPHIN ) 1 g in sodium chloride  0.9 % 100 mL IVPB (0 g Intravenous Stopped 10/15/23 1616)                                    Medical Decision Making Amount and/or Complexity of Data Reviewed Labs: ordered.  Risk Prescription drug management.   This patient presents to the ED for concern of ams, this involves an extensive number of treatment options, and is a complaint that carries with it a high risk of complications and morbidity.  The differential diagnosis includes infection, electrolyte abn,  anemia   Co morbidities that complicate the patient evaluation  htn, anxiety, parkinson's lupus, and dementia   Additional history obtained:  Additional history obtained from epic chart review External records from outside source obtained  and reviewed including daughter   Lab Tests:  I Ordered, and personally interpreted labs.  The pertinent results include:  ua with + nitrites, small LE, many bacteria; CBC nl except for hgb 11.4 (stable); cmp with cr 1.45 (stable)  Cardiac Monitoring:  The patient was maintained on a cardiac monitor.  I personally viewed and interpreted the cardiac monitored which showed an underlying rhythm of: nsr   Medicines ordered and prescription drug management:  I ordered medication including rocephin   for sx  Reevaluation of the patient after these medicines showed that the patient improved I have reviewed the patients home medicines and have made adjustments as needed   Problem List / ED Course:  AMS:  likely due to UTI.  Pt is back to her baseline now.  Pt given a dose of rocephin  in ED.  She is d/c with keflex .  She is to return if worse.  F/u with pcp.  Reevaluation:  After the interventions noted above, I reevaluated the patient and found that they have :improved   Social Determinants of Health:  Lives at home   Dispostion:  After consideration of the diagnostic results and the patients response to treatment, I feel that the patent would benefit from discharge with outpatient f/u.       Final diagnoses:  Acute cystitis without hematuria    ED Discharge Orders          Ordered    cephALEXin  (KEFLEX ) 500 MG capsule  3 times daily        10/15/23 1533               Dean Clarity, MD 10/15/23 [phone]

## 2023-10-15 NOTE — ED Triage Notes (Signed)
 Pts daughter reports that she was not responding to her earlier and was just staring at her. She reports that she has done this before and has had a uti. Pt responsive in triage, denies pain and any symptoms.

## 2023-10-17 LAB — URINE CULTURE: Culture: >=100000 — AB

## 2023-10-18 ENCOUNTER — Telehealth (HOSPITAL_BASED_OUTPATIENT_CLINIC_OR_DEPARTMENT_OTHER): Payer: Self-pay

## 2023-10-18 NOTE — Telephone Encounter (Signed)
 Post ED Visit - Positive Culture Follow-up  Culture report reviewed by antimicrobial stewardship pharmacist: [facility] []  Rankin Dee, Pharm.D. []  Venetia Gully, Pharm.D., BCPS AQ-ID []  Garrel Crews, Pharm.D., BCPS []  Almarie Lunger, Pharm.D., BCPS []  [place], [address].D., BCPS, AAHIVP []  Rosaline Bihari, Pharm.D., BCPS, AAHIVP []  Vernell Meier, PharmD, BCPS []  Latanya Hint, PharmD, BCPS []  Donald Medley, PharmD, BCPS []  Rocky Bold, PharmD []  Dorothyann Alert, PharmD, BCPS []  Morene Babe, PharmD  [facility] []  Rosaline Edison, PharmD []  Romona Bliss, PharmD []  Dolphus Roller, PharmD []  Veva Seip, Rph []  Vernell Daunt) Leonce, PharmD []  Eva Allis, PharmD []  Rosaline Millet, PharmD []  Iantha Batch, PharmD []  Arvin Gauss, PharmD [x]  Wanda Hasting, PharmD []  Ronal Rav, PharmD []  Rocky Slade, PharmD []  Bard Jeans, PharmD   Positive urine culture Treated with Cephalexin , organism sensitive to the same and no further patient follow-up is required at this time.  Whitney Henderson 10/18/2023, 12:12 PM

## 2023-10-22 ENCOUNTER — Ambulatory Visit (INDEPENDENT_AMBULATORY_CARE_PROVIDER_SITE_OTHER): Admitting: Family Medicine

## 2023-10-22 ENCOUNTER — Encounter: Payer: Self-pay | Admitting: Family Medicine

## 2023-10-22 VITALS — BP 140/90 | HR 64 | Temp 98.6°F | Ht 62.0 in | Wt 110.0 lb

## 2023-10-22 DIAGNOSIS — N3000 Acute cystitis without hematuria: Secondary | ICD-10-CM

## 2023-10-22 DIAGNOSIS — G20C Parkinsonism, unspecified: Secondary | ICD-10-CM

## 2023-10-22 NOTE — Progress Notes (Signed)
 I,Whitney Henderson, CMA,acting as a Neurosurgeon for [employer], NP.,have documented all relevant documentation on the behalf of Whitney Creighton, NP,as directed by  Whitney Creighton, NP while in the presence of Whitney Creighton, NP.  Subjective:  Patient ID: Whitney Henderson , female    DOB: [birth year] , 89 y.o.   MRN: [id number]  Chief Complaint  Patient presents with   ER F/U    Patient presents today for a ER f/u. Patient is here with her daughter Whitney Henderson. Patient reports all her symptoms have gone away but they would like to do a recheck to make sure her uti has cleared.     HPI Discussed the use of AI scribe software for clinical note transcription with the patient, who gave verbal consent to proceed.  History of Present Illness          Whitney Henderson is a 89 year old female who presents with a urinary tract infection.  She was taken to the emergency room by her daughter due to unresponsiveness. Prior to this, she experienced frequent falls and her urine had a strong odor, raising concerns for a urinary tract infection.  In the emergency room, she was treated with intravenous fluids and received Rocephin  1 gram IV. She was prescribed oral antibiotics to be taken three times a day for seven days, with three doses remaining.  No fever, stomach pain, increased frequency of urination, blood in her urine, or accidents related to the urinary tract infection. She is currently feeling better.      Past Medical History:  Diagnosis Date   Anxiety    Cataracts, bilateral    Dementia (HCC)    followed by neuro per patient's daughter   Discoid lupus    Headache 04/27/2015   Left>right periorbital   Hypertension    Memory difficulties 09/04/2016   Parkinson's disease (HCC)    followed by neuro per patient's daughter     Family History  Problem Relation Age of Onset   Early death Mother    Cancer Father    Thyroid  disease Daughter    Breast cancer Daughter        65s   Glaucoma Daughter     Hypertension Daughter    Heart attack Son    Thyroid  disease Son      Current Outpatient Medications:    acetaminophen  (TYLENOL ) 500 MG tablet, Take 500 mg by mouth every 6 (six) hours as needed for mild pain or moderate pain., Disp: , Rfl:    amLODipine  (NORVASC ) 2.5 MG tablet, Take 1 tablet (2.5 mg total) by mouth in the morning and at bedtime., Disp: 180 tablet, Rfl: 1   Apoaequorin (PREVAGEN PO), Take 1 capsule by mouth every other day., Disp: , Rfl:    b complex vitamins capsule, Take 1 capsule by mouth every other day., Disp: , Rfl:    betamethasone dipropionate (DIPROLENE) 0.05 % cream, APPLY THIN LAYER EXTERNALLY TO THE AFFECTED AREA EVERY DAY AS NEEDED, Disp: 60 g, Rfl: 2   Calcium Carb-Cholecalciferol (CALCIUM 500 +D PO), Take 1 tablet by mouth every other day., Disp: , Rfl:    carbidopa -levodopa  (SINEMET  IR) 25-100 MG tablet, Take 1 tablet by mouth 3 (three) times daily., Disp: 270 tablet, Rfl: 1   cephALEXin  (KEFLEX ) 500 MG capsule, Take 1 capsule (500 mg total) by mouth 3 (three) times daily., Disp: 21 capsule, Rfl: 0   Multiple Vitamins-Minerals (CENTRUM SILVER PO), Take 1 tablet by mouth every other day., Disp: ,  Rfl:    Propylene Glycol (SYSTANE BALANCE OP), Place 1 drop into both eyes daily as needed (for itchy eyes)., Disp: , Rfl:    QUEtiapine  (SEROQUEL ) 25 MG tablet, Take 1 tablet (25 mg total) by mouth at bedtime., Disp: 30 tablet, Rfl: 11   Romosozumab -aqqg (EVENITY ) 105 MG/1. SOSY injection, Inject 210 mg into the skin once. Evenity  210mg  monthly x 12 doses. Patient has one remaining doses., Disp: , Rfl:    sertraline  (ZOLOFT ) 50 MG tablet, Take 1 tablet (50 mg total) by mouth daily., Disp: 90 tablet, Rfl: 1   thiamine  100 MG tablet, Take 100 mg by mouth daily., Disp: , Rfl:    triamcinolone  cream (KENALOG ) 0.1 %, APPLY TO AFFECTED AREA TWICE DAILY AS NEEDED, Disp: 45 g, Rfl: 0   vitamin C  (ASCORBIC ACID ) 500 MG tablet, Take 500 mg by mouth every other day., Disp:  , Rfl:    Allergies  Allergen Reactions   Exelon  [Rivastigmine ] Itching and Rash   Fish Allergy Other (See Comments)    Does not do well with Seafood.   Codeine Rash     Review of Systems  HENT: Negative.    Eyes: Negative.   Respiratory: Negative.    Cardiovascular: Negative.   Gastrointestinal: Negative.        Patient was in a wheelchair.Accompanied by daughter  Genitourinary: Negative.   Musculoskeletal: Negative.   Skin: Negative.   Hematological: Negative.   Psychiatric/Behavioral: Negative.       Today's Vitals   10/22/23 1447  BP: (!) 140/90  Pulse: 64  Temp: 98.6 F (37 C)  TempSrc: Oral  Weight: 110 lb (49.9 kg)  Height: 5' 2 (1.575 m)  PainSc: 0-No pain   Body mass index is 20.12 kg/m.  Wt Readings from Last 3 Encounters:  10/22/23 110 lb (49.9 kg)  09/19/23 113 lb 6.4 oz (51.4 kg)  09/08/23 110 lb (49.9 kg)    The ASCVD Risk score (Arnett DK, et al., 2019) failed to calculate for the following reasons:   The 2019 ASCVD risk score is only valid for ages 54 to 52  Objective:  Physical Exam Cardiovascular:     Rate and Rhythm: Normal rate and regular rhythm.     Pulses: Normal pulses.  Neurological:     Mental Status: She is alert. Mental status is at baseline.         Assessment And Plan:  Acute cystitis without hematuria Assessment & Plan: Continue Keflex  TID ; 2 days remaining.   Parkinsonism, unspecified Parkinsonism type [facility]) Assessment & Plan: Followed by neurology; continue medications     Return if symptoms worsen or fail to improve, for keep next appt.  Patient was given opportunity to ask questions. Patient verbalized understanding of the plan and was able to repeat key elements of the plan. All questions were answered to their satisfaction.    I, Whitney Creighton, NP, have reviewed all documentation for this visit. The documentation on 11/03/2023 for the exam, diagnosis, procedures, and orders are all accurate and complete.    IF YOU HAVE BEEN REFERRED TO A SPECIALIST, IT MAY TAKE 1-2 WEEKS TO SCHEDULE/PROCESS THE REFERRAL. IF YOU HAVE NOT HEARD FROM US /SPECIALIST IN TWO WEEKS, PLEASE GIVE US  A CALL AT [phone] X [phone].

## 2023-10-30 ENCOUNTER — Telehealth: Payer: Self-pay | Admitting: Internal Medicine

## 2023-10-30 NOTE — Telephone Encounter (Unsigned)
 Copied from CRM [id number]. Topic: General - Other >> Oct 30, 2023 11:32 AM Donee H wrote: Reason for CRM: Carlo from [employer] calling to see if a fax from Oct. 7, 2025 was received from [phone] regarding patient.She states she will fax it over again today. Confirmed fax number with her again to make sure she has correct information. Please follow up with her when faxed is received

## 2023-10-31 IMAGING — DX DG SHOULDER 2+V PORT*R*
1 series · 2 of 2 positions shown · non-contrast
Comparison: None.

CLINICAL DATA: Right shoulder pain after fall today.

EXAM:
PORTABLE RIGHT SHOULDER

[Series 1: shoulder · 0.14mm/px · 2 of 2 slices shown]
[im 1/2]
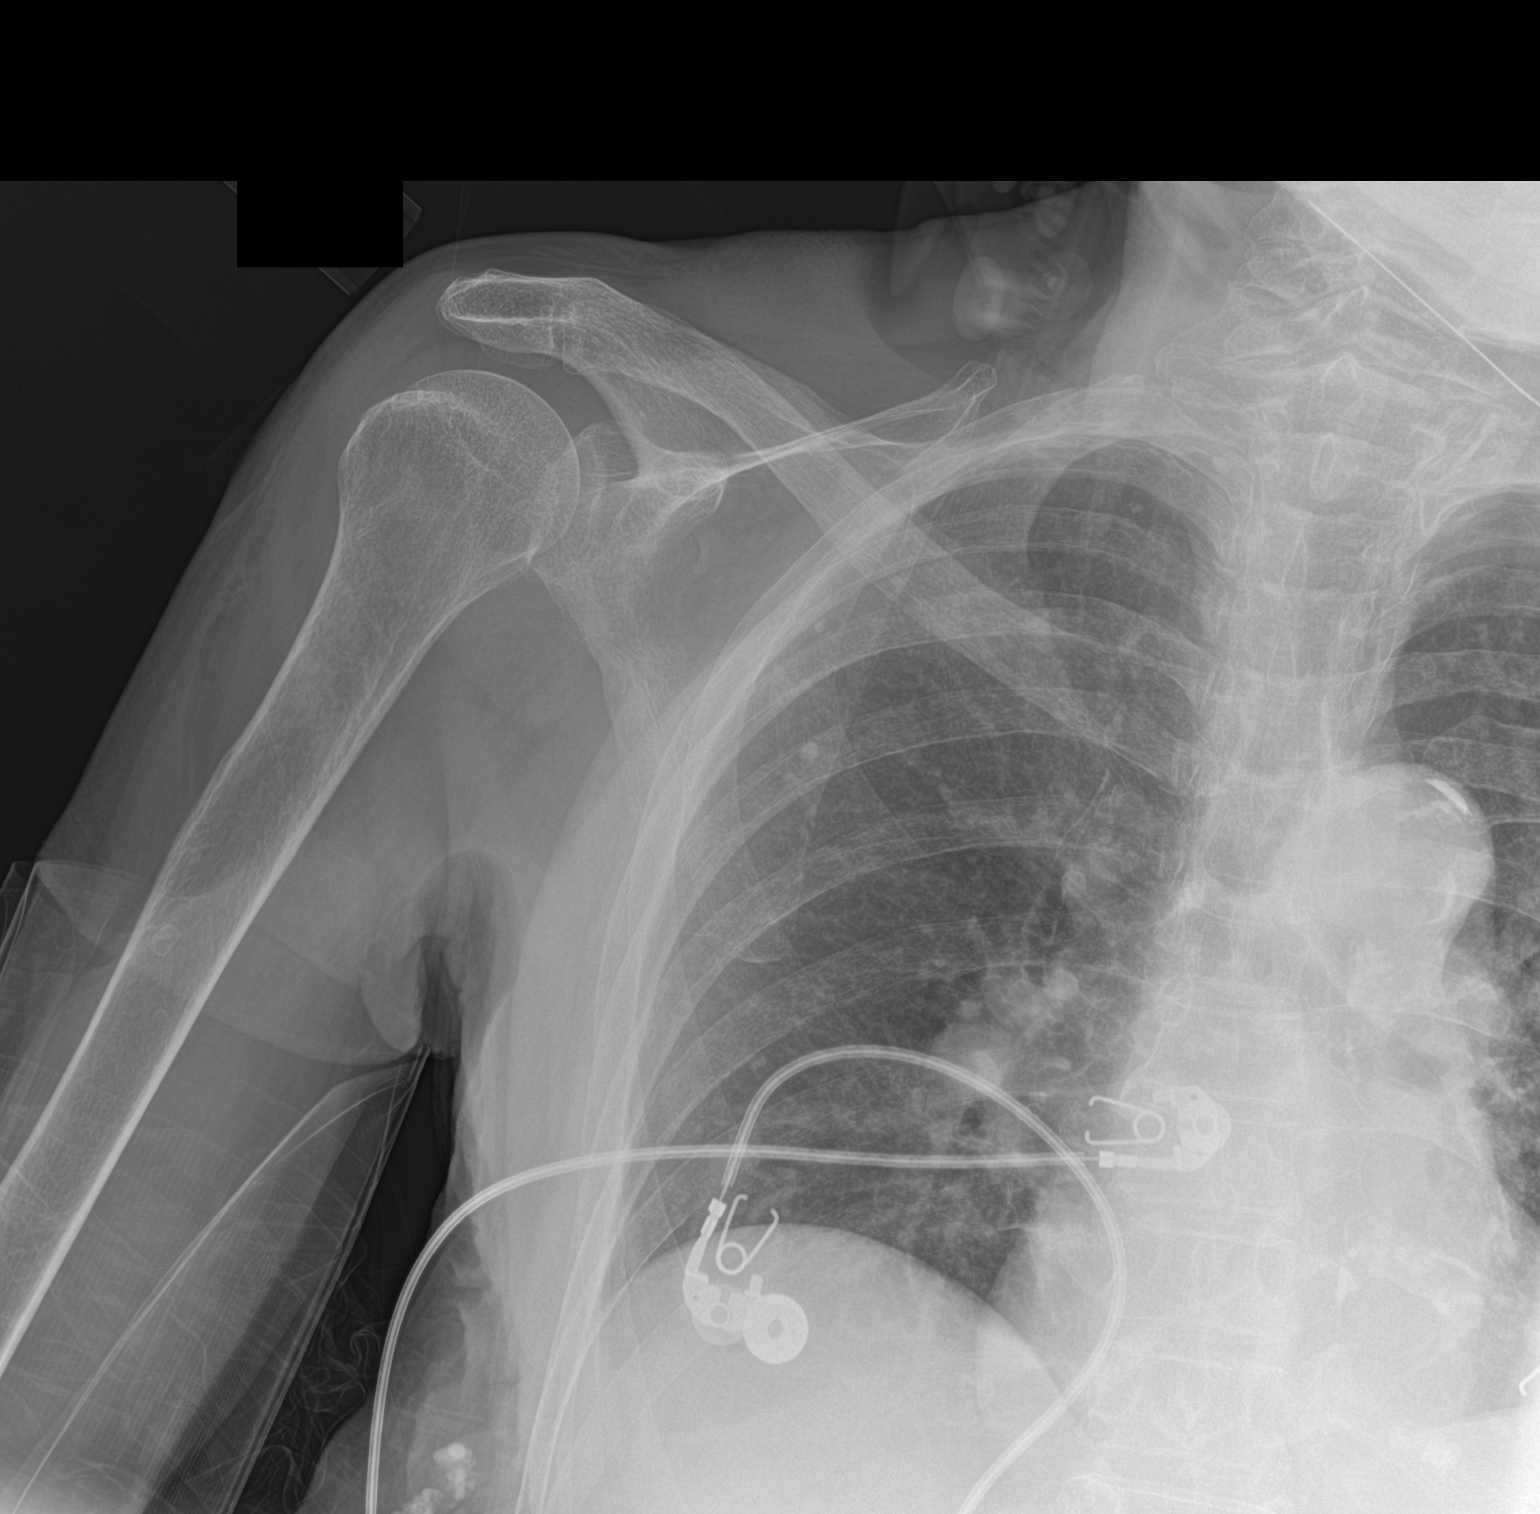
[im 2/2]
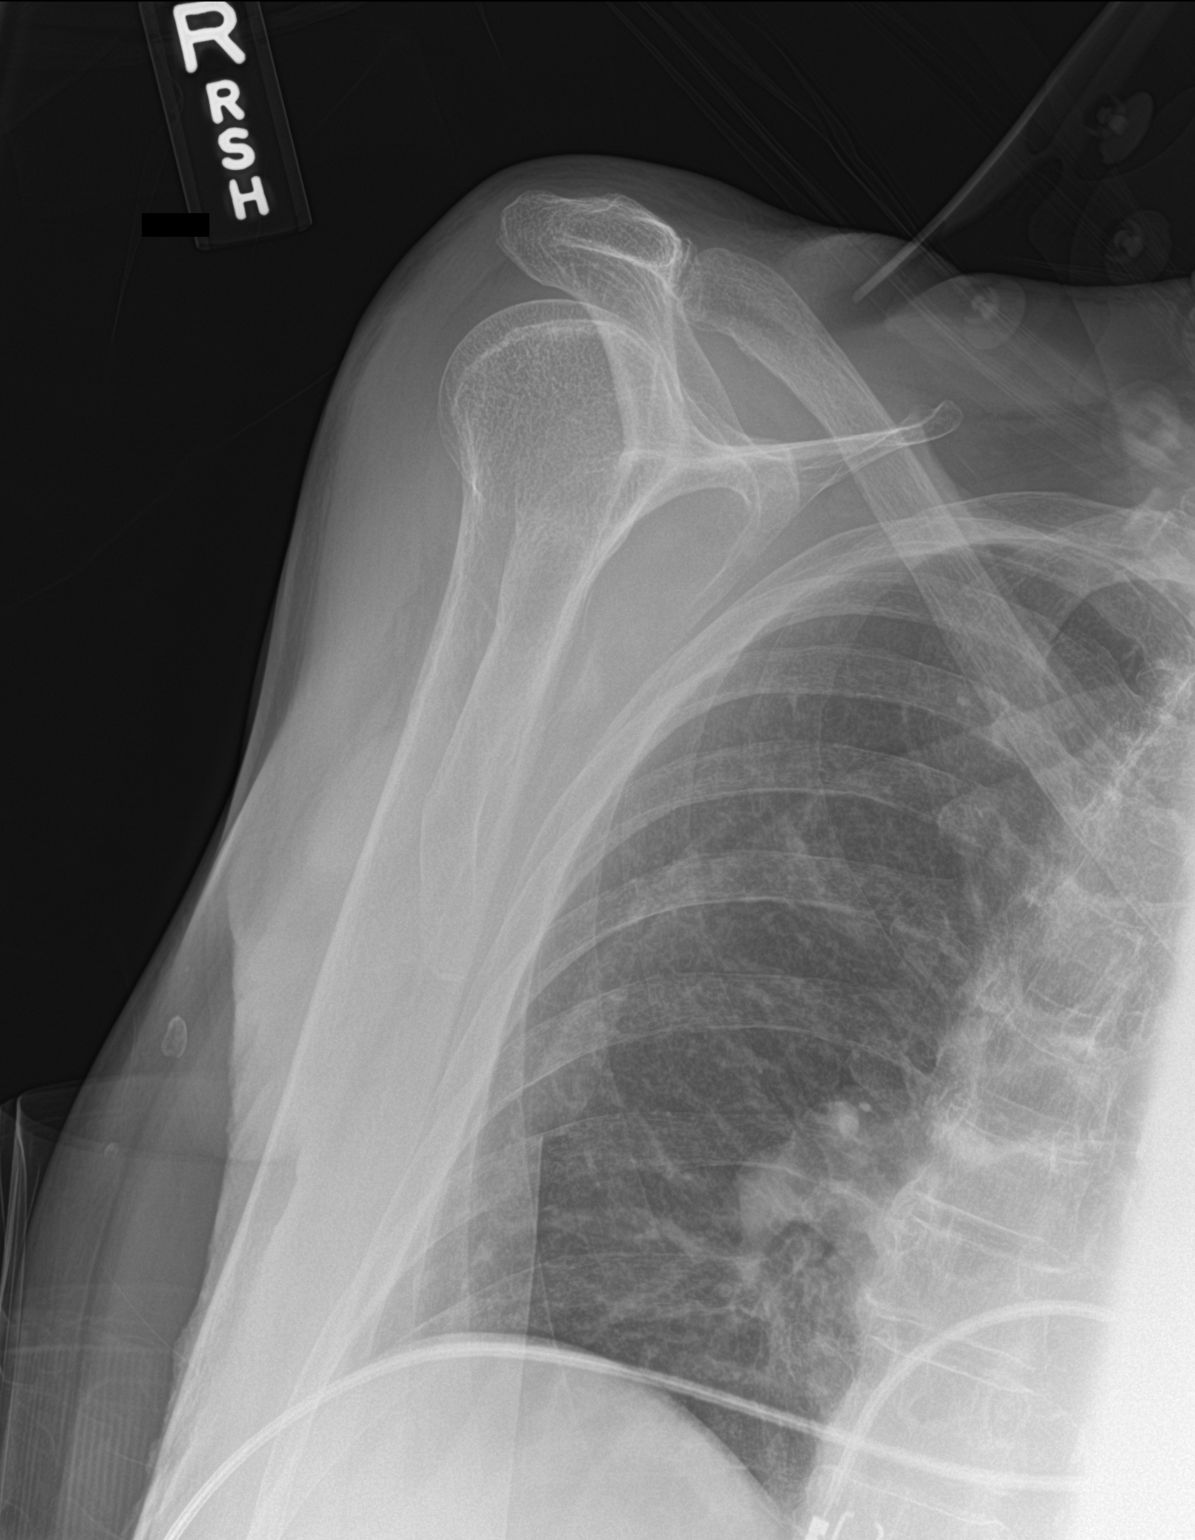

[2 of 2 positions shown; findings below may reference images not displayed]

FINDINGS: There is no evidence of fracture or dislocation. There is no
evidence of arthropathy or other focal bone abnormality. Soft
tissues are unremarkable.
IMPRESSION: Negative.

## 2023-10-31 IMAGING — CT CT CERVICAL SPINE W/O CM
3 of 4 series · 13 of 33 positions shown, 16 images · non-contrast
Comparison: None.

CLINICAL DATA: Trauma to the head, face and neck

EXAM:
CT CERVICAL SPINE WITHOUT CONTRAST
TECHNIQUE: Multidetector CT imaging of the cervical spine was performed without
intravenous contrast. Multiplanar CT image reconstructions were also
generated.

[Series 4: c_spine 2.0 st · axial · 0.35mm/px · z∈[-471,-375]mm · 5 of 74 slices shown, 7 images]
[im 13/74  soft-tissue]
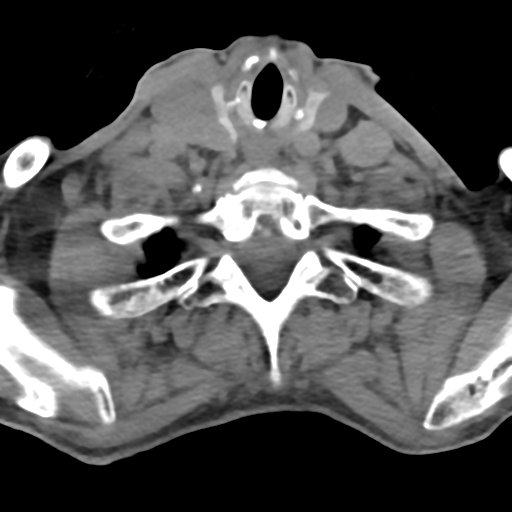
[im 13/74  bone]
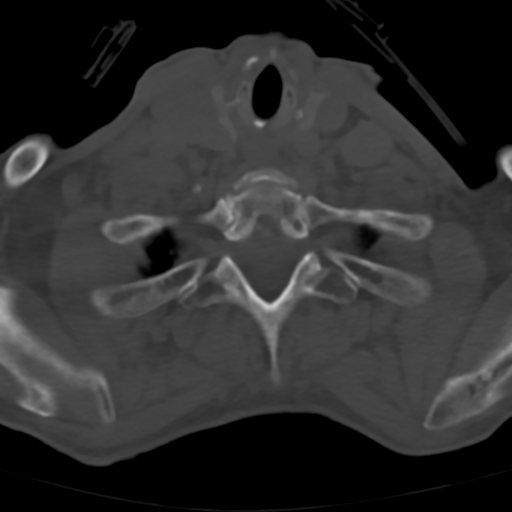
[im 25/74  bone]
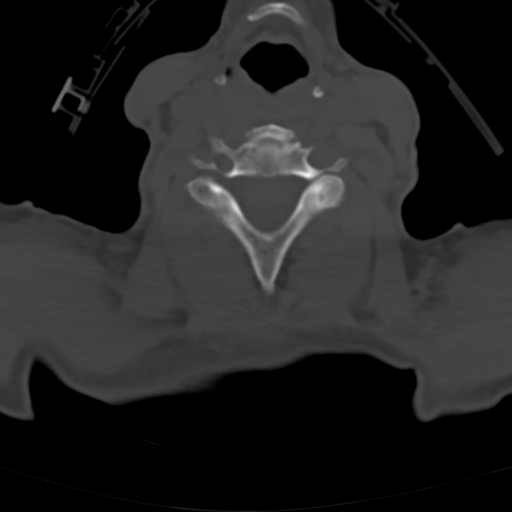
[im 37/74  bone]
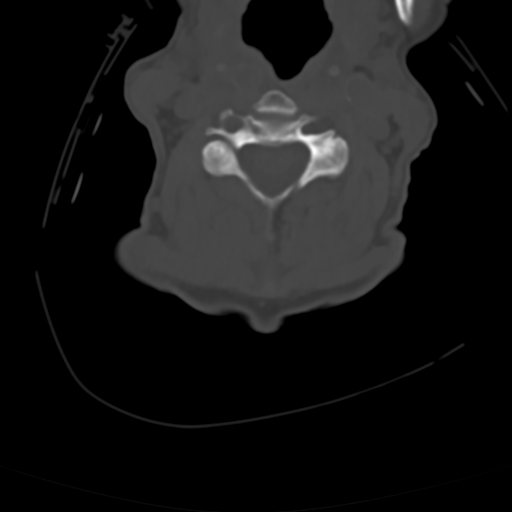
[im 49/74  bone]
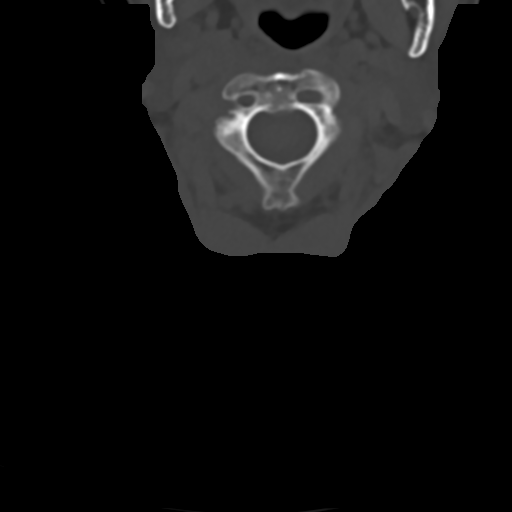
[im 61/74  soft-tissue]
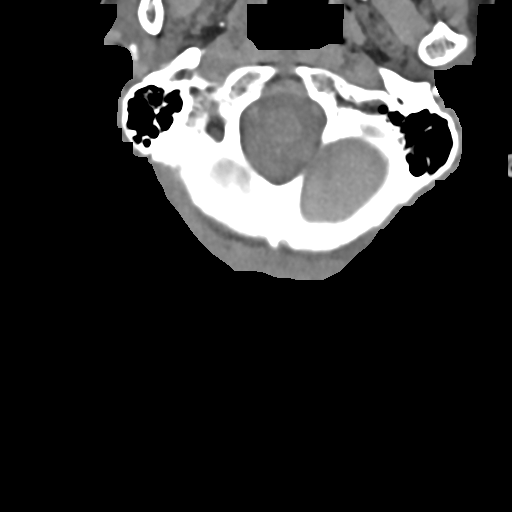
[im 61/74  bone]
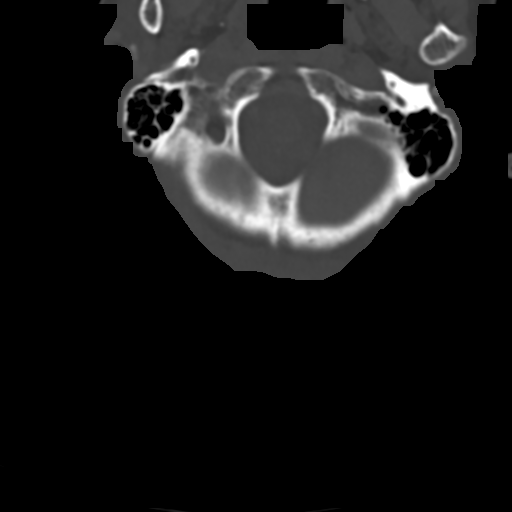

[Series 6: c_spine 2.0 sag bone · sagittal · 0.27mm/px · 5 of 61 slices shown, 6 images]
[im 21/61  bone]
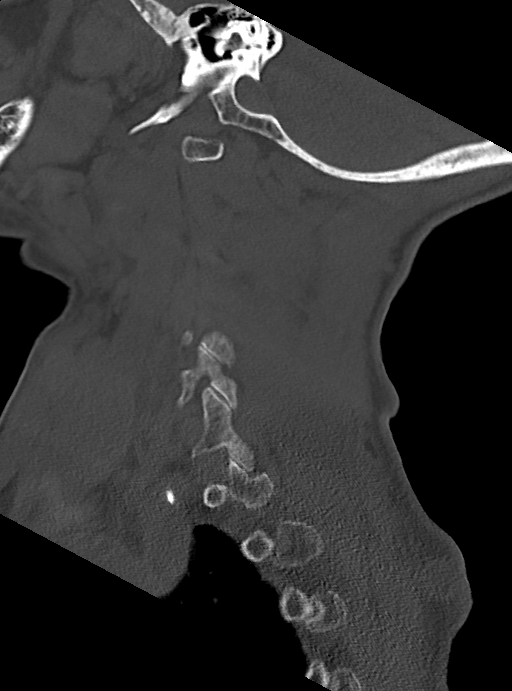
[im 26/61  bone]
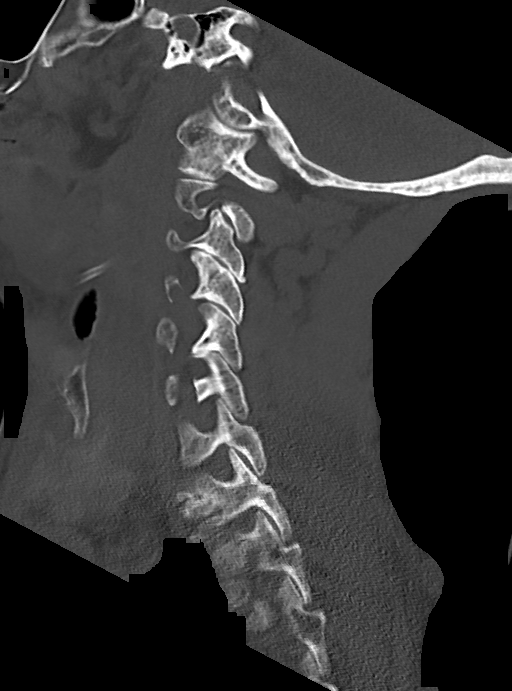
[im 31/61  soft-tissue]
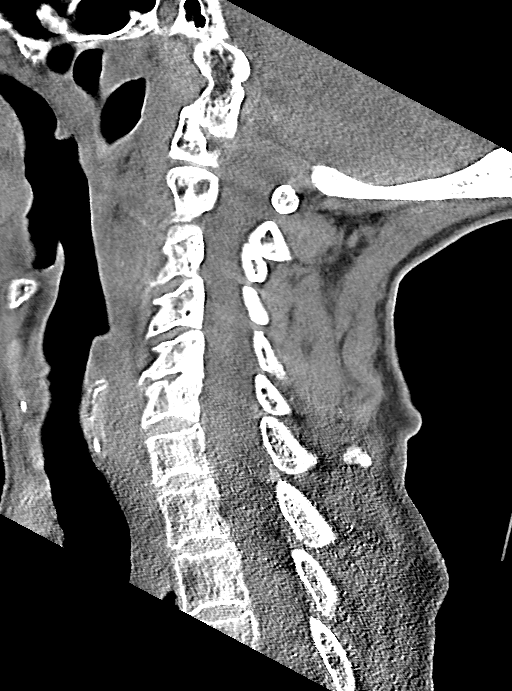
[im 31/61  bone]
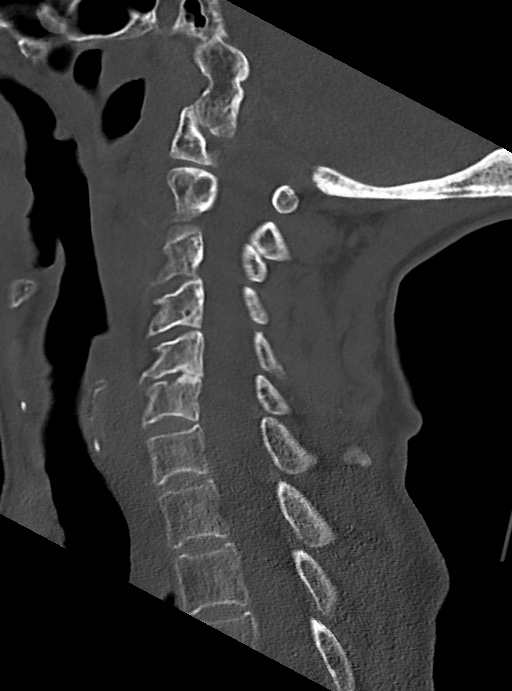
[im 36/61  bone]
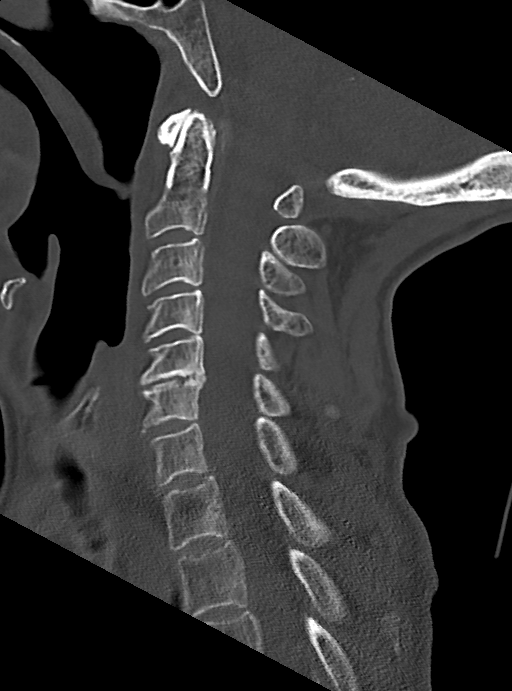
[im 41/61  bone]
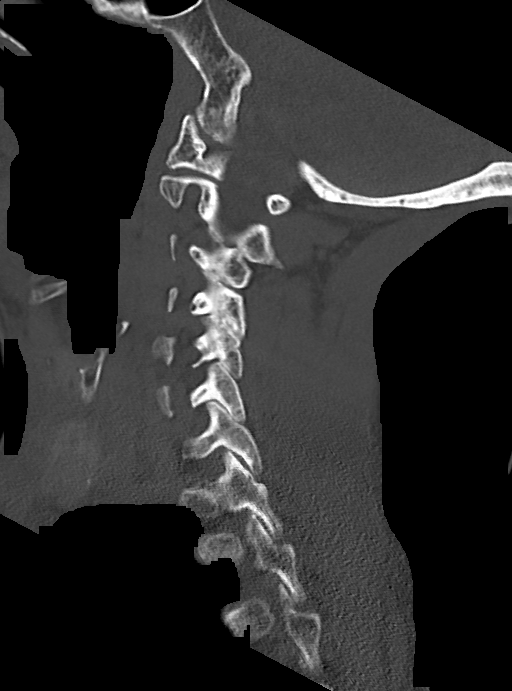

[Series 7: c_spine 2.0 cor bone · coronal · 0.23mm/px · 3 of 69 slices shown]
[im 19/69  bone]
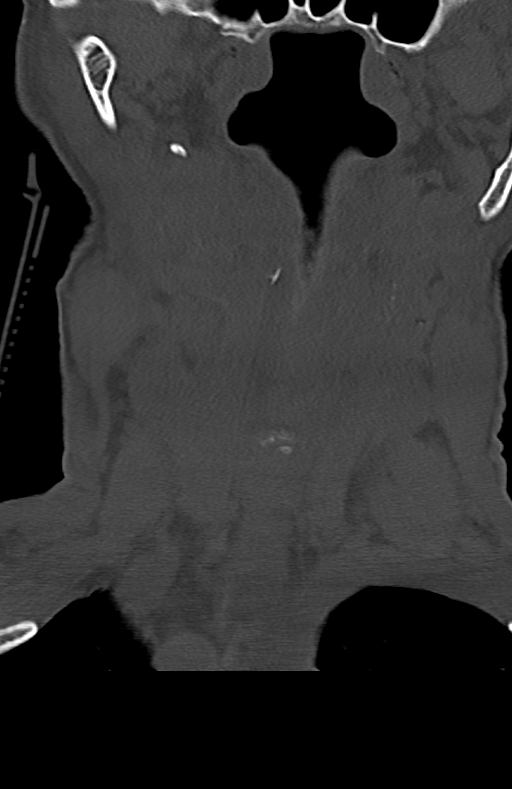
[im 29/69  bone]
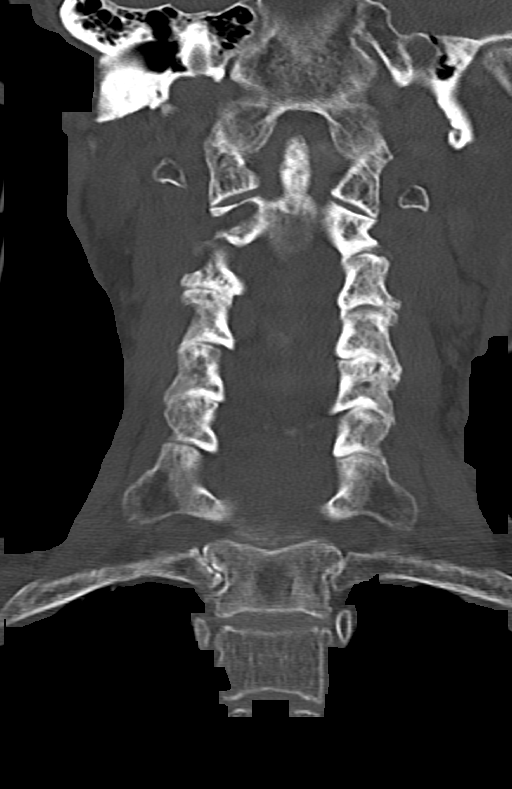
[im 40/69  bone]
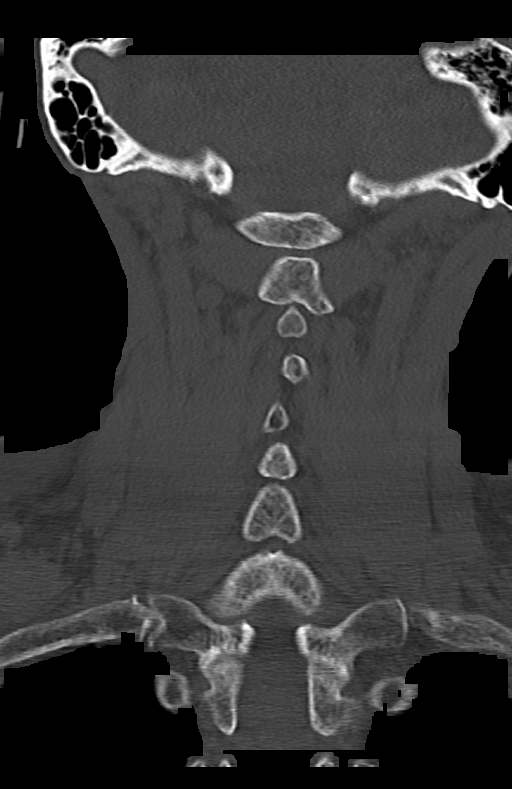

[13 of 33 positions shown; findings below may reference images not displayed]

FINDINGS: Alignment: No malalignment.

Skull base and vertebrae: No fracture or focal bone lesion.

Soft tissues and spinal canal: No traumatic soft tissue finding.

Disc levels: Chronic spondylosis at C5-6. No compressive narrowing
of the canal or foramina.

Upper chest: Minimal apical scarring.

Other: None
IMPRESSION: No traumatic finding.  Ordinary mild spondylosis C5-6.

## 2023-10-31 IMAGING — CT CT MAXILLOFACIAL W/O CM
3 of 4 series · 15 of 47 positions shown, 18 images · non-contrast
Comparison: None.

CLINICAL DATA: Facial trauma

EXAM:
CT MAXILLOFACIAL WITHOUT CONTRAST
TECHNIQUE: Multidetector CT imaging of the maxillofacial structures was
performed. Multiplanar CT image reconstructions were also generated.

[Series 3: facialbone 2.0 st · axial · 0.34mm/px · z∈[-460,-296]mm · 9 of 96 slices shown, 12 images]
[im 7/96  brain]
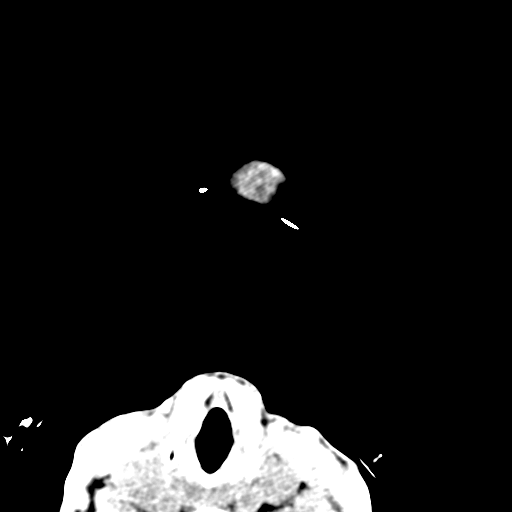
[im 7/96  bone]
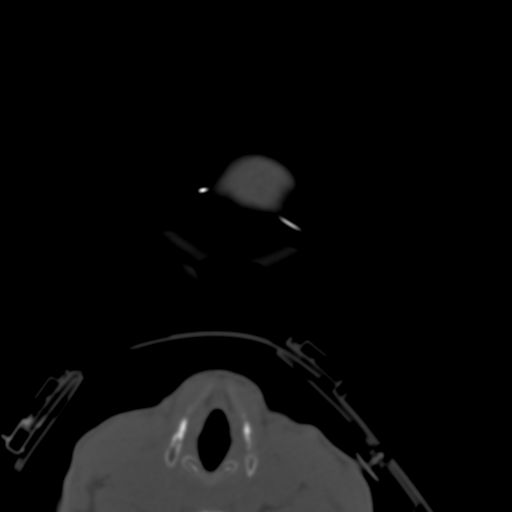
[im 17/96  bone]
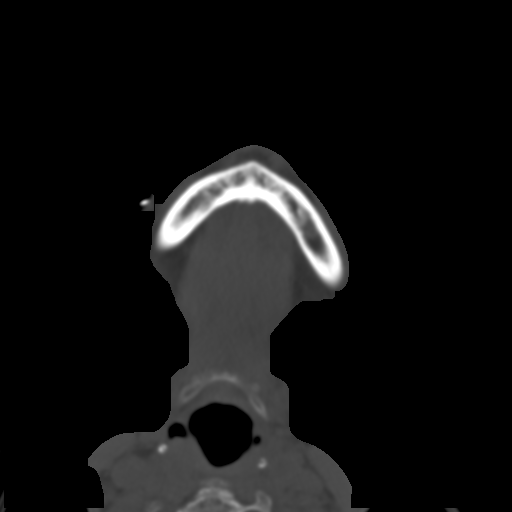
[im 27/96  bone]
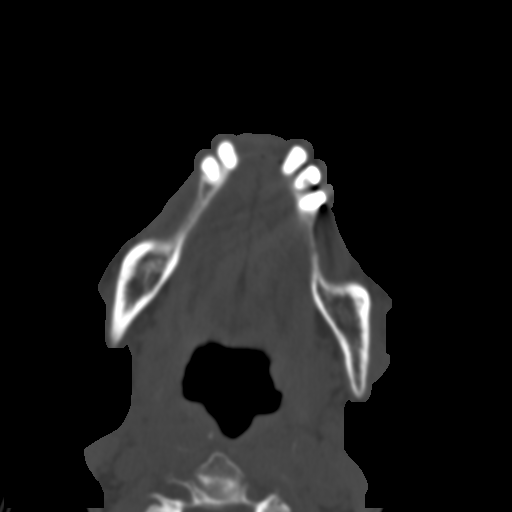
[im 37/96  bone]
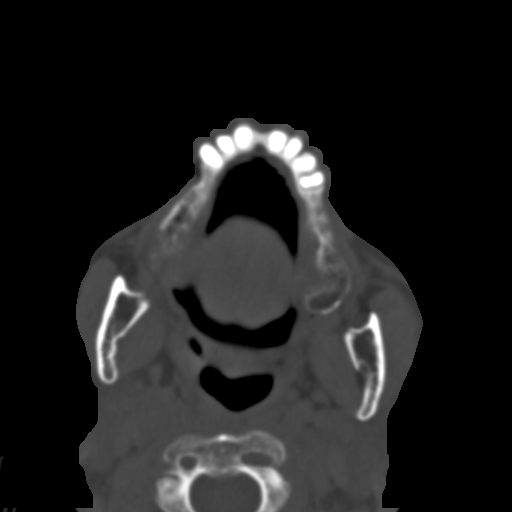
[im 50/96  brain]
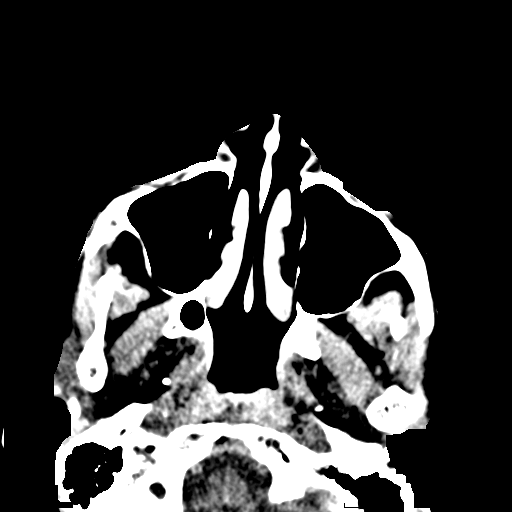
[im 50/96  bone]
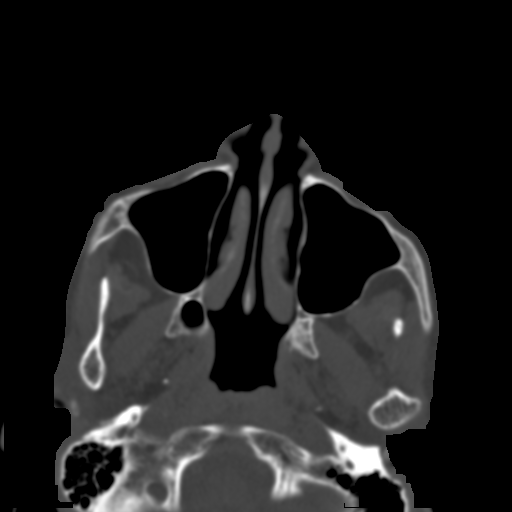
[im 59/96  bone]
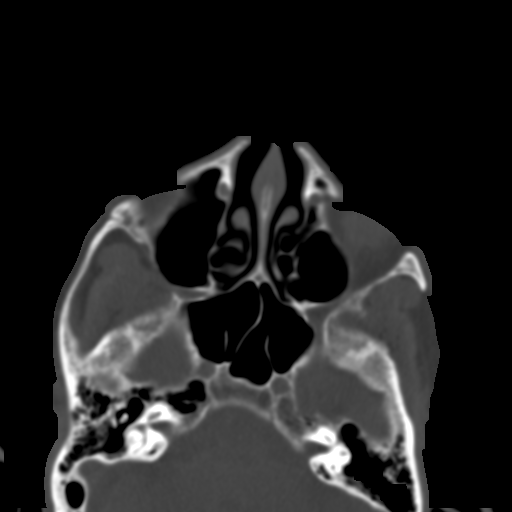
[im 69/96  bone]
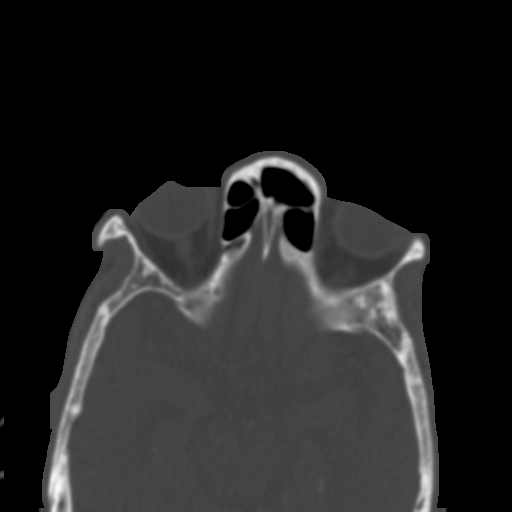
[im 79/96  bone]
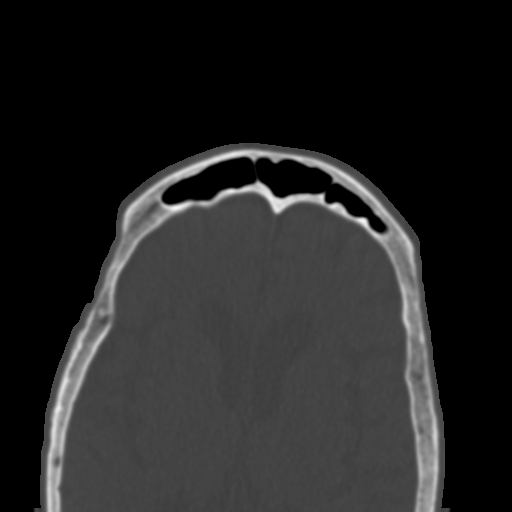
[im 89/96  brain]
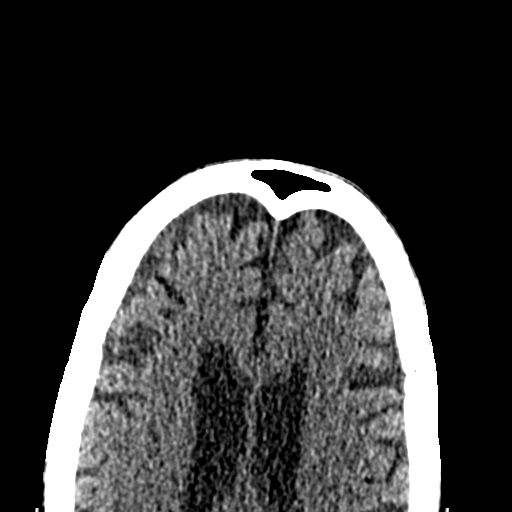
[im 89/96  bone]
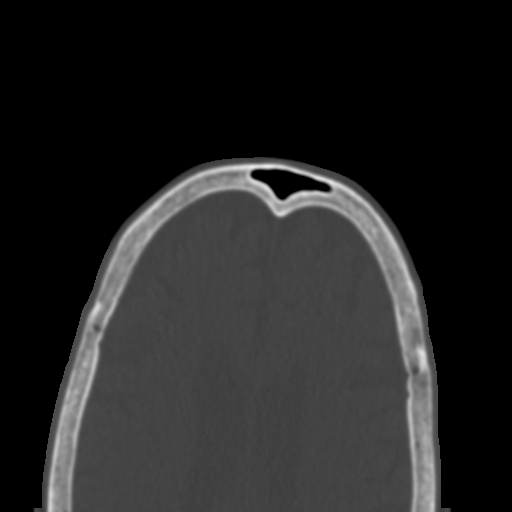

[Series 5: bone 2.0 cor · coronal · 0.29mm/px · 3 of 67 slices shown]
[im 17/67  bone]
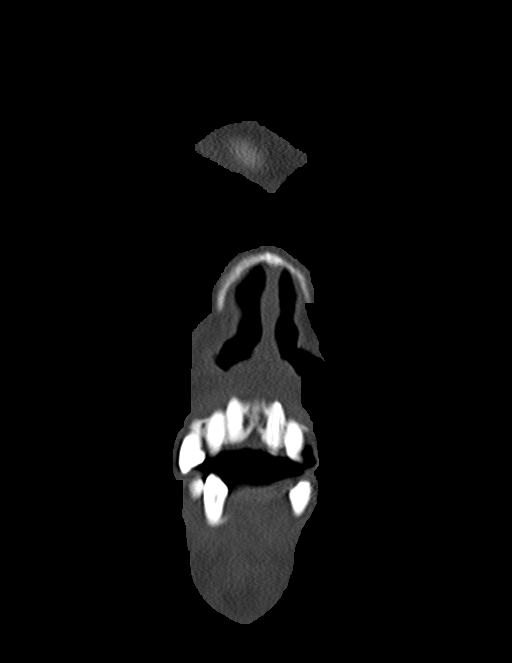
[im 34/67  bone]
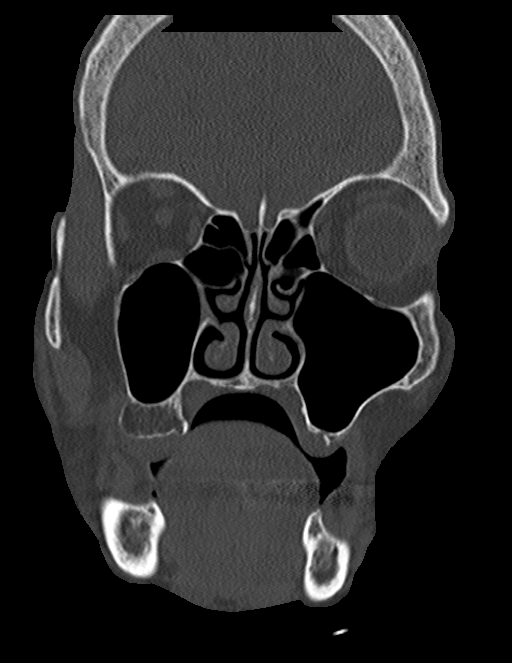
[im 50/67  bone]
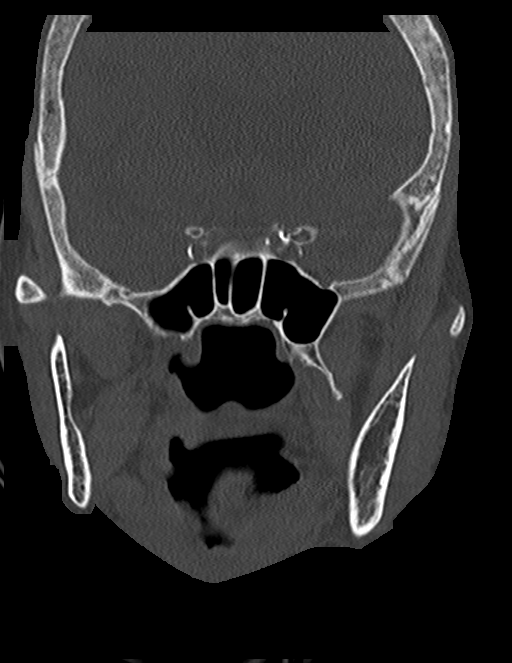

[Series 8: facialbone 2.0 sag st · sagittal · 0.26mm/px · 3 of 83 slices shown]
[im 28/83  bone]
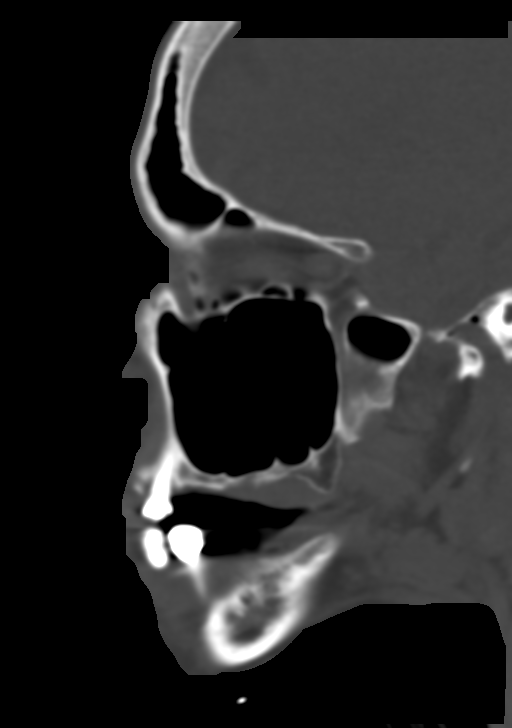
[im 42/83  bone]
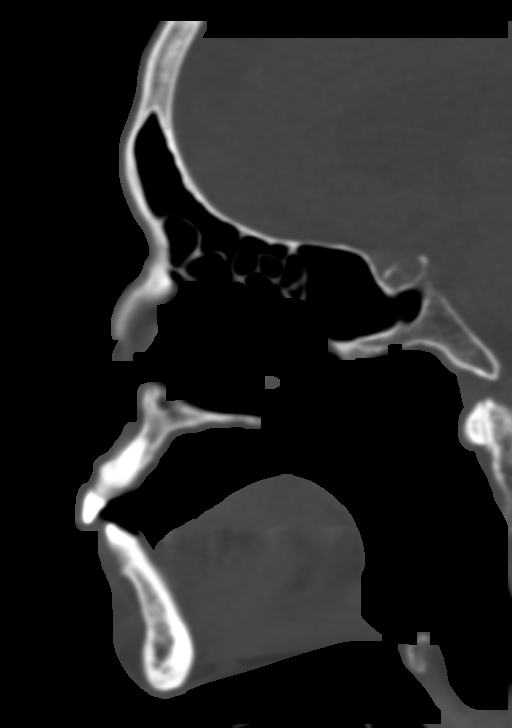
[im 55/83  bone]
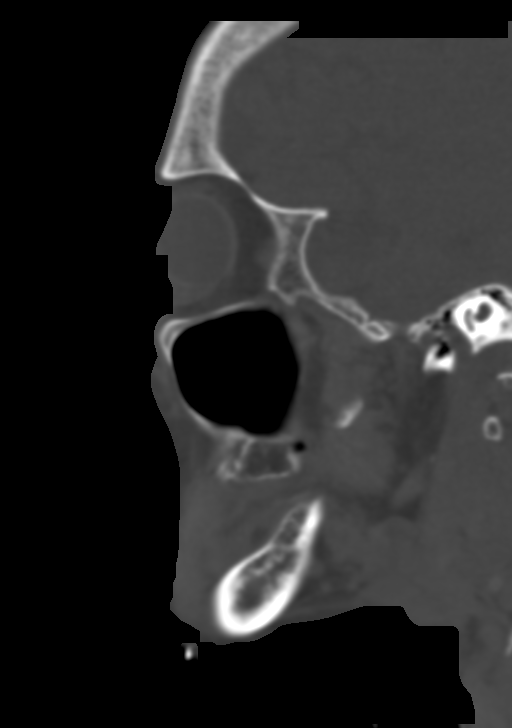

[15 of 47 positions shown; findings below may reference images not displayed]

FINDINGS: Osseous: No evidence of facial fracture.

Orbits: No evidence of orbital injury.

Sinuses: Paranasal sinuses are clear.

Soft tissues: Negative

Limited intracranial: Brain atrophy as shown at head CT.
IMPRESSION: No traumatic facial finding.

## 2023-11-04 NOTE — Assessment & Plan Note (Signed)
 Followed by neurology; continue medications

## 2023-11-04 NOTE — Assessment & Plan Note (Signed)
 Continue Keflex  TID ; 2 days remaining.

## 2023-11-06 ENCOUNTER — Ambulatory Visit: Payer: Self-pay | Admitting: Internal Medicine

## 2023-11-06 ENCOUNTER — Other Ambulatory Visit (INDEPENDENT_AMBULATORY_CARE_PROVIDER_SITE_OTHER)

## 2023-11-06 DIAGNOSIS — R8290 Unspecified abnormal findings in urine: Secondary | ICD-10-CM | POA: Diagnosis not present

## 2023-11-06 LAB — POCT URINALYSIS DIP (CLINITEK)
Bilirubin, UA: NEGATIVE
Blood, UA: NEGATIVE
Glucose, UA: NEGATIVE mg/dL
Ketones, POC UA: NEGATIVE mg/dL
Nitrite, UA: NEGATIVE
POC PROTEIN,UA: NEGATIVE
Spec Grav, UA: 1.01 (ref 1.010–1.025)
Urobilinogen, UA: 0.2 U/dL
pH, UA: 6 (ref 5.0–8.0)

## 2023-11-19 NOTE — Telephone Encounter (Signed)
 Carlo with [employer] called back once gain as she still has not heard from anyone or received the form that was originally faxed to the provider on October 2nd. I do see it in the patients chart. I called and spoke with Shivonne who transferred me to the nurses line only to get a voice mail. This is a Long Term Care Form and she needs this as soon as possible to be faxed to [phone]. She can also be reached at [phone] at [phone]. Please assist patient further

## 2023-12-03 ENCOUNTER — Encounter: Payer: Self-pay | Admitting: Podiatry

## 2023-12-03 ENCOUNTER — Ambulatory Visit: Admitting: Podiatry

## 2023-12-03 DIAGNOSIS — M79675 Pain in left toe(s): Secondary | ICD-10-CM | POA: Diagnosis not present

## 2023-12-03 DIAGNOSIS — B351 Tinea unguium: Secondary | ICD-10-CM | POA: Diagnosis not present

## 2023-12-03 DIAGNOSIS — M79674 Pain in right toe(s): Secondary | ICD-10-CM | POA: Diagnosis not present

## 2023-12-03 DIAGNOSIS — L89619 Pressure ulcer of right heel, unspecified stage: Secondary | ICD-10-CM

## 2023-12-13 NOTE — Progress Notes (Signed)
  Subjective:  Patient ID: Whitney Henderson, female    DOB: 05-29-1928,  MRN: [id number]  Whitney Henderson presents to clinic today for at risk foot care. Patient has history of pressure injury posterolateral aspect of right heel  and painful, discolored, thick toenails which interfere with daily activities. She is accompanied by her daughter on today's visit. Daughter relates patient cannot properly used heel protectors and would like to know what other treatments are available to relieve pressure.  New problem(s): None.   PCP is Jarold Medici, MD. Whitney Henderson 09/02/2023.  Allergies  Allergen Reactions   Exelon  [Rivastigmine ] Itching and Rash   Fish Allergy Other (See Comments)    Does not do well with Seafood.   Codeine Rash    Review of Systems: Negative except as noted in the HPI.  Objective: No changes noted in today's physical examination. There were no vitals filed for this visit. Whitney Henderson is a pleasant 89 y.o. female in NAD. AAO x 3.  Vascular Examination: CFT <3 seconds b/l. DP/PT pulses faintly palpable b/l. Digital hair absent. No edema b/l. Skin temperature gradient warm to warm b/l. No pain with calf compression. No ischemia or gangrene. No cyanosis or clubbing noted b/l.    Neurological Examination: Sensation grossly intact b/l with 10 gram monofilament. Vibratory sensation intact b/l.   Dermatological Examination: Pedal skin warm and supple b/l.   No open wounds. No interdigital macerations.  Toenails 1-5 b/l thick, discolored, elongated with subungual debris and pain on dorsal palpation.  .  Preulcerative lesion noted posterolateral aspect of right heel and posteromedial aspect of right heel. There is visible subdermal hemorrhage. There is no surrounding erythema, no edema, no drainage, no odor, no fluctuance.  Musculoskeletal Examination: Muscle strength 4/5 to all lower extremity muscle groups bilaterally. HAV with bunion deformity noted b/l LE. Hammertoe(s) 2-5  b/l. Utilizes transport chair for ambulation assistance.  Radiographs: None  Assessment/Plan: 1. Pain due to onychomycosis of toenails of both feet   2. Pressure injury of skin of right heel, unspecified injury stage   -Patient with h/o dementia/Alzheimer's/cognitive deficit. Patient's family member present. All questions/concerns addressed on today's visit. -Patient cannot properly utilize heel protectors per daughter. Advised floating heels on pillows to avoid pressure. They related understanding.SABRA Heel lesion gently filed with dremel device without incident. TAO applied. -Patient to continue soft, supportive shoe gear daily. -Toenails 1-5 b/l were debrided in length and girth with sterile nail nippers and dremel without iatrogenic bleeding.  -Recommended daily use of Revitaderm Cream to callus(es)/porokeratotic lesion(s) and weekly to biweekly use of pumice stone to lesions. Avoid use of sharp instruments such as razor blades or metal  -Patient/POA to call should there be question/concern in the interim.   Return in about 3 months (around 03/04/2024).  Delon LITTIE Merlin, DPM      [facility] LOCATION: [address] [place] [address]                   Office [phone]   [facility] LOCATION: [address] [place], [place] [address] Office [phone]

## 2023-12-25 ENCOUNTER — Ambulatory Visit: Admitting: Physician Assistant

## 2023-12-30 ENCOUNTER — Telehealth: Payer: Self-pay | Admitting: *Deleted

## 2023-12-30 MED ORDER — CARBIDOPA-LEVODOPA 25-100 MG PO TABS: 1.0000 | ORAL_TABLET | Freq: Three times a day (TID) | ORAL | 3 refills | Status: AC

## 2023-12-30 NOTE — Telephone Encounter (Signed)
 Last seen on 09/08/23 Follow up scheduled on 03/08/24    Dr. Onita per note on 09/08/23  Sinemet  25/100 mg twice a day was helping her gait.  I called daughter Olena ( has DRP access)  and she confirmed pt is taking BID, ( we received refill request for TID and it appears she has been getting Rx refilled TID)  Daughter Olena wanted to know how far apart pt should be taking Rx BID? The patient wakes up about 12 noon for 1st dose and she typically gives 2nd dose at 6pm.   She wanted to confirm this is okay? Please advise

## 2023-12-30 NOTE — Telephone Encounter (Signed)
 I called her daughter [name], she is only taking Sinemet  25/100 mg twice a day, when she first get up and before she goes to bed instead of suggested during office visit  She usually get up around noon, goes to bed around 10pm  I have suggested her to take Sinemet  25/100 mg 12, 3 PM 6 PM, new prescription was sent in  Meds ordered this encounter  Medications   carbidopa -levodopa  (SINEMET  IR) 25-100 MG tablet    Sig: Take 1 tablet by mouth 3 (three) times daily.    Dispense:  270 tablet    Refill:  3

## 2024-01-06 ENCOUNTER — Ambulatory Visit: Payer: Self-pay | Admitting: Internal Medicine

## 2024-01-06 ENCOUNTER — Encounter: Payer: Self-pay | Admitting: Internal Medicine

## 2024-01-06 VITALS — BP 130/70 | HR 64 | Temp 98.7°F | Ht 62.0 in | Wt 110.0 lb

## 2024-01-06 DIAGNOSIS — N1832 Chronic kidney disease, stage 3b: Secondary | ICD-10-CM | POA: Diagnosis not present

## 2024-01-06 DIAGNOSIS — G20C Parkinsonism, unspecified: Secondary | ICD-10-CM

## 2024-01-06 DIAGNOSIS — Z6820 Body mass index (BMI) 20.0-20.9, adult: Secondary | ICD-10-CM | POA: Diagnosis not present

## 2024-01-06 DIAGNOSIS — I129 Hypertensive chronic kidney disease with stage 1 through stage 4 chronic kidney disease, or unspecified chronic kidney disease: Secondary | ICD-10-CM

## 2024-01-06 DIAGNOSIS — F419 Anxiety disorder, unspecified: Secondary | ICD-10-CM

## 2024-01-06 MED ORDER — AMLODIPINE BESYLATE 2.5 MG PO TABS: 2.5000 mg | ORAL_TABLET | Freq: Two times a day (BID) | ORAL | 1 refills | Status: AC

## 2024-01-06 NOTE — Progress Notes (Signed)
 I,[name], CMA,acting as a neurosurgeon for Whitney LOISE Slocumb, MD.,have documented all relevant documentation on the behalf of Whitney LOISE Slocumb, MD,as directed by  Whitney LOISE Slocumb, MD while in the presence of Whitney LOISE Slocumb, MD.  Subjective:  Patient ID: Whitney Henderson , female    DOB: July 23, 1928 , 89 y.o.   MRN: [id number]  Chief Complaint  Patient presents with   Hypertension    Patient presents today for a bpc. Patient presents today with her daughter [name]. Patient reports compliance with her meds. Patient denies having any chest pain, sob or headaches at this time   Weight Check    HPI Discussed the use of AI scribe software for clinical note transcription with the patient, who gave verbal consent to proceed.  History of Present Illness Whitney Henderson is a 89 year old female with hypertension and Parkinson's disease who presents for a blood pressure check and medication review.  She is accompanied by her daughter.   She has been taking amlodipine  twice daily for hypertension. Her caregiver mentioned that she might need a refill in two weeks. She recently picked up a prescription from [facility].  She has Parkinson's disease and is taking Sinemet  (carbidopa /levodopa ) three times a day. Previously, she was taking it twice a day, but the frequency was increased. Previously, she was taking it twice a day, but the frequency was increased to help with mobility. She also takes sertraline  for mood.  She completed a course of Evenity  injections for bone health, having finished the entire treatment program of twelve injections.  She received a flu shot in September at [place], although the record was not sent to the current provider.  No pain or current symptoms of illness. She has experienced some nervousness and excitement during the visit.   Hypertension This is a chronic problem. The current episode started more than 1 year ago. The problem has been gradually improving since onset.  The problem is uncontrolled. Pertinent negatives include no blurred vision, chest pain, headaches, neck pain, orthopnea, palpitations or shortness of breath. Risk factors for coronary artery disease include sedentary lifestyle and post-menopausal state. The current treatment provides moderate improvement. Compliance problems include exercise.      Past Medical History:  Diagnosis Date   Anxiety    Cataracts, bilateral    Dementia (HCC)    followed by neuro per patient's daughter   Discoid lupus    Headache 04/27/2015   Left>right periorbital   Hypertension    Memory difficulties 09/04/2016   Parkinson's disease (HCC)    followed by neuro per patient's daughter     Family History  Problem Relation Age of Onset   Early death Mother    Cancer Father    Thyroid  disease Daughter    Breast cancer Daughter        70s   Glaucoma Daughter    Hypertension Daughter    Heart attack Son    Thyroid  disease Son      Current Outpatient Medications:    acetaminophen  (TYLENOL ) 500 MG tablet, Take 500 mg by mouth every 6 (six) hours as needed for mild pain or moderate pain., Disp: , Rfl:    Apoaequorin (PREVAGEN PO), Take 1 capsule by mouth every other day., Disp: , Rfl:    b complex vitamins capsule, Take 1 capsule by mouth every other day., Disp: , Rfl:    betamethasone dipropionate (DIPROLENE) 0.05 % cream, APPLY THIN LAYER EXTERNALLY TO THE AFFECTED AREA EVERY DAY  AS NEEDED, Disp: 60 g, Rfl: 2   Calcium Carb-Cholecalciferol (CALCIUM 500 +D PO), Take 1 tablet by mouth every other day., Disp: , Rfl:    carbidopa -levodopa  (SINEMET  IR) 25-100 MG tablet, Take 1 tablet by mouth 3 (three) times daily., Disp: 270 tablet, Rfl: 3   Multiple Vitamins-Minerals (CENTRUM SILVER PO), Take 1 tablet by mouth every other day., Disp: , Rfl:    Propylene Glycol (SYSTANE BALANCE OP), Place 1 drop into both eyes daily as needed (for itchy eyes)., Disp: , Rfl:    QUEtiapine  (SEROQUEL ) 25 MG tablet, Take 1 tablet  (25 mg total) by mouth at bedtime., Disp: 30 tablet, Rfl: 11   Romosozumab -aqqg (EVENITY ) 105 MG/1. SOSY injection, Inject 210 mg into the skin once. Evenity  210mg  monthly x 12 doses. Patient has one remaining doses., Disp: , Rfl:    sertraline  (ZOLOFT ) 50 MG tablet, Take 1 tablet (50 mg total) by mouth daily., Disp: 90 tablet, Rfl: 1   thiamine  100 MG tablet, Take 100 mg by mouth daily., Disp: , Rfl:    triamcinolone  cream (KENALOG ) 0.1 %, APPLY TO AFFECTED AREA TWICE DAILY AS NEEDED, Disp: 45 g, Rfl: 0   vitamin C  (ASCORBIC ACID ) 500 MG tablet, Take 500 mg by mouth every other day., Disp: , Rfl:    amLODipine  (NORVASC ) 2.5 MG tablet, Take 1 tablet (2.5 mg total) by mouth in the morning and at bedtime., Disp: 180 tablet, Rfl: 1   Allergies  Allergen Reactions   Exelon  [Rivastigmine ] Itching and Rash   Fish Allergy Other (See Comments)    Does not do well with Seafood.   Codeine Rash     Review of Systems  Constitutional: Negative.   Eyes:  Negative for blurred vision.  Respiratory: Negative.  Negative for shortness of breath.   Cardiovascular: Negative.  Negative for chest pain, palpitations and orthopnea.  Gastrointestinal: Negative.   Musculoskeletal:  Negative for neck pain.  Neurological:  Negative for headaches.  Hematological: Negative.      Today's Vitals   01/06/24 1410  BP: 130/70  Pulse: 64  Temp: 98.7 F (37.1 C)  TempSrc: Oral  Weight: 110 lb (49.9 kg)  Height: 5' 2 (1.575 m)  PainSc: 0-No pain   Body mass index is 20.12 kg/m.  Wt Readings from Last 3 Encounters:  01/06/24 110 lb (49.9 kg)  10/22/23 110 lb (49.9 kg)  09/19/23 113 lb 6.4 oz (51.4 kg)    The ASCVD Risk score (Arnett DK, et al., 2019) failed to calculate for the following reasons:   The 2019 ASCVD risk score is only valid for ages 84 to 35   * - Cholesterol units were assumed  Objective:  Physical Exam Vitals and nursing note reviewed.  Constitutional:      Appearance: Normal  appearance.  HENT:     Head: Normocephalic and atraumatic.  Eyes:     Extraocular Movements: Extraocular movements intact.  Cardiovascular:     Rate and Rhythm: Normal rate and regular rhythm.     Heart sounds: Normal heart sounds.  Pulmonary:     Effort: Pulmonary effort is normal.     Breath sounds: Normal breath sounds.  Musculoskeletal:     Cervical back: Normal range of motion.     Comments: Ambulatory with cane  Skin:    General: Skin is warm.  Neurological:     General: No focal deficit present.     Mental Status: She is alert.  Psychiatric:  Mood and Affect: Mood normal.        Behavior: Behavior normal.         Assessment And Plan:   Assessment & Plan Parenchymal renal hypertension, stage 1 through stage 4 or unspecified chronic kidney disease Chronic, controlled.  Blood pressure managed with amlodipine . - Sent refill for amlodipine  to [facility] on [address]. Stage 3b chronic kidney disease (HCC) I will check labs as below. She is encouraged to stay well hydrated, avoid NSAIDs and keep BP controlled to prevent progression of CKD.   Parkinsonism, unspecified Parkinsonism type (HCC) Chronic, Sinemet  dosing adjusted to improve mobility and reduce fall risk. Neurologist recommended three times daily dosing. - Continue Sinemet  three times daily as per neurologist's recommendation. Anxiety Chronic, managed with sertraline . - Continue sertraline  for mood stabilization. BMI 20.0-20.9, adult She has lost 3 lbs since August. Encouraged to consider use of Ensure/Boost as a meal supplement.  General health maintenance Emphasized nutrition and hydration to prevent illness. Confirmed flu vaccination received in September. - Encouraged daily consumption of hot beverages such as soup or tea.   Orders Placed This Encounter  Procedures   BMP8+EGFR   PTH, intact and calcium   Phosphorus   Protein electrophoresis, serum     Return in about 4 months (around  05/06/2024), or bp check weight check.  Patient was given opportunity to ask questions. Patient verbalized understanding of the plan and was able to repeat key elements of the plan. All questions were answered to their satisfaction.    I, Whitney LOISE Slocumb, MD, have reviewed all documentation for this visit. The documentation on 01/18/2024 for the exam, diagnosis, procedures, and orders are all accurate and complete.   IF YOU HAVE BEEN REFERRED TO A SPECIALIST, IT MAY TAKE 1-2 WEEKS TO SCHEDULE/PROCESS THE REFERRAL. IF YOU HAVE NOT HEARD FROM US /SPECIALIST IN TWO WEEKS, PLEASE GIVE US  A CALL AT [phone].

## 2024-01-06 NOTE — Patient Instructions (Signed)
 Hypertension, Adult Hypertension is another name for high blood pressure. High blood pressure forces your heart to work harder to pump blood. This can cause problems over time. There are two numbers in a blood pressure reading. There is a top number (systolic) over a bottom number (diastolic). It is best to have a blood pressure that is below 120/80. What are the causes? The cause of this condition is not known. Some other conditions can lead to high blood pressure. What increases the risk? Some lifestyle factors can make you more likely to develop high blood pressure: Smoking. Not getting enough exercise or physical activity. Being overweight. Having too much fat, sugar, calories, or salt (sodium) in your diet. Drinking too much alcohol. Other risk factors include: Having any of these conditions: Heart disease. Diabetes. High cholesterol. Kidney disease. Obstructive sleep apnea. Having a family history of high blood pressure and high cholesterol. Age. The risk increases with age. Stress. What are the signs or symptoms? High blood pressure may not cause symptoms. Very high blood pressure (hypertensive crisis) may cause: Headache. Fast or uneven heartbeats (palpitations). Shortness of breath. Nosebleed. Vomiting or feeling like you may vomit (nauseous). Changes in how you see. Very bad chest pain. Feeling dizzy. Seizures. How is this treated? This condition is treated by making healthy lifestyle changes, such as: Eating healthy foods. Exercising more. Drinking less alcohol. Your doctor may prescribe medicine if lifestyle changes do not help enough and if: Your top number is above 130. Your bottom number is above 80. Your personal target blood pressure may vary. Follow these instructions at home: Eating and drinking  If told, follow the DASH eating plan. To follow this plan: Fill one half of your plate at each meal with fruits and vegetables. Fill one fourth of your plate  at each meal with whole grains. Whole grains include whole-wheat pasta, brown rice, and whole-grain bread. Eat or drink low-fat dairy products, such as skim milk or low-fat yogurt. Fill one fourth of your plate at each meal with low-fat (lean) proteins. Low-fat proteins include fish, chicken without skin, eggs, beans, and tofu. Avoid fatty meat, cured and processed meat, or chicken with skin. Avoid pre-made or processed food. Limit the amount of salt in your diet to less than 1,500 mg each day. Do not drink alcohol if: Your doctor tells you not to drink. You are pregnant, may be pregnant, or are planning to become pregnant. If you drink alcohol: Limit how much you have to: 0-1 drink a day for women. 0-2 drinks a day for men. Know how much alcohol is in your drink. In the [place] one drink equals one 12 oz bottle of beer (355 mL), one 5 oz glass of wine (148 mL), or one 1 oz glass of hard liquor (44 mL). Lifestyle  Work with your doctor to stay at a healthy weight or to lose weight. Ask your doctor what the best weight is for you. Get at least 30 minutes of exercise that causes your heart to beat faster (aerobic exercise) most days of the week. This may include walking, swimming, or biking. Get at least 30 minutes of exercise that strengthens your muscles (resistance exercise) at least 3 days a week. This may include lifting weights or doing Pilates. Do not smoke or use any products that contain nicotine or tobacco. If you need help quitting, ask your doctor. Check your blood pressure at home as told by your doctor. Keep all follow-up visits. Medicines Take over-the-counter and prescription medicines  only as told by your doctor. Follow directions carefully. Do not skip doses of blood pressure medicine. The medicine does not work as well if you skip doses. Skipping doses also puts you at risk for problems. Ask your doctor about side effects or reactions to medicines that you should watch  for. Contact a doctor if: You think you are having a reaction to the medicine you are taking. You have headaches that keep coming back. You feel dizzy. You have swelling in your ankles. You have trouble with your vision. Get help right away if: You get a very bad headache. You start to feel mixed up (confused). You feel weak or numb. You feel faint. You have very bad pain in your: Chest. Belly (abdomen). You vomit more than once. You have trouble breathing. These symptoms may be an emergency. Get help right away. Call 911. Do not wait to see if the symptoms will go away. Do not drive yourself to the hospital. Summary Hypertension is another name for high blood pressure. High blood pressure forces your heart to work harder to pump blood. For most people, a normal blood pressure is less than 120/80. Making healthy choices can help lower blood pressure. If your blood pressure does not get lower with healthy choices, you may need to take medicine. This information is not intended to replace advice given to you by your health care provider. Make sure you discuss any questions you have with your health care provider. Document Revised: 10/26/2020 Document Reviewed: 10/26/2020 Elsevier Patient Education  2024 [employer].

## 2024-01-08 LAB — PROTEIN ELECTROPHORESIS, SERUM
A/G Ratio: 0.9 (ref 0.7–1.7)
Albumin ELP: 3.4 g/dL (ref 2.9–4.4)
Alpha 1: 0.3 g/dL (ref 0.0–0.4)
Alpha 2: 1 g/dL (ref 0.4–1.0)
Beta: 1.2 g/dL (ref 0.7–1.3)
Gamma Globulin: 1.4 g/dL (ref 0.4–1.8)
Globulin, Total: 4 g/dL — ABNORMAL HIGH (ref 2.2–3.9)
Total Protein: 7.4 g/dL (ref 6.0–8.5)

## 2024-01-08 LAB — BMP8+EGFR
BUN/Creatinine Ratio: 20 (ref 12–28)
BUN: 31 mg/dL (ref 10–36)
CO2: 24 mmol/L (ref 20–29)
Calcium: 9.3 mg/dL (ref 8.7–10.3)
Chloride: 101 mmol/L (ref 96–106)
Creatinine, Ser: 1.56 mg/dL — ABNORMAL HIGH (ref 0.57–1.00)
Glucose: 93 mg/dL (ref 70–99)
Potassium: 4.3 mmol/L (ref 3.5–5.2)
Sodium: 139 mmol/L (ref 134–144)
eGFR: 31 mL/min/1.73 — ABNORMAL LOW (ref >59)

## 2024-01-08 LAB — PHOSPHORUS: Phosphorus: 3.7 mg/dL (ref 3.0–4.3)

## 2024-01-08 LAB — PTH, INTACT AND CALCIUM: PTH: 31 pg/mL (ref 15–65)

## 2024-01-13 ENCOUNTER — Encounter: Payer: Self-pay | Admitting: Physician Assistant

## 2024-01-13 NOTE — Addendum Note (Signed)
 Addended by: DAYNE SHERRY RAMAN on: 01/13/2024 11:01 AM   Modules accepted: Orders

## 2024-01-16 ENCOUNTER — Ambulatory Visit: Payer: Self-pay | Admitting: Internal Medicine

## 2024-01-18 NOTE — Assessment & Plan Note (Signed)
 Chronic, controlled.  Blood pressure managed with amlodipine . - Sent refill for amlodipine  to [facility] on [address].

## 2024-01-18 NOTE — Assessment & Plan Note (Signed)
 I will check labs as below. She is encouraged to stay well hydrated, avoid NSAIDs and keep BP controlled to prevent progression of CKD.

## 2024-01-18 NOTE — Assessment & Plan Note (Signed)
 Chronic, Sinemet  dosing adjusted to improve mobility and reduce fall risk. Neurologist recommended three times daily dosing. - Continue Sinemet  three times daily as per neurologist's recommendation.

## 2024-01-18 NOTE — Assessment & Plan Note (Signed)
 Chronic, managed with sertraline . - Continue sertraline  for mood stabilization.

## 2024-02-18 ENCOUNTER — Encounter: Payer: Self-pay | Admitting: Internal Medicine

## 2024-03-08 ENCOUNTER — Ambulatory Visit: Admitting: Neurology

## 2024-03-24 ENCOUNTER — Ambulatory Visit: Admitting: Podiatry

## 2024-05-06 ENCOUNTER — Ambulatory Visit: Payer: Self-pay | Admitting: Internal Medicine

## 2024-05-11 ENCOUNTER — Ambulatory Visit: Admitting: Internal Medicine

## 2024-11-03 ENCOUNTER — Ambulatory Visit: Payer: Self-pay
